# Patient Record
Sex: Male | Born: 1970 | Race: White | Hispanic: No | Marital: Single | Smoking: Never smoker
Health system: Southern US, Community
[De-identification: ages and names within clinical notes are randomized; demographics above are authoritative.]

## PROBLEM LIST (undated history)

## (undated) DIAGNOSIS — M109 Gout, unspecified: Secondary | ICD-10-CM

## (undated) DIAGNOSIS — Z87442 Personal history of urinary calculi: Secondary | ICD-10-CM

## (undated) DIAGNOSIS — N390 Urinary tract infection, site not specified: Secondary | ICD-10-CM

## (undated) DIAGNOSIS — T7840XA Allergy, unspecified, initial encounter: Secondary | ICD-10-CM

## (undated) DIAGNOSIS — E663 Overweight: Secondary | ICD-10-CM

## (undated) DIAGNOSIS — F840 Autistic disorder: Secondary | ICD-10-CM

## (undated) DIAGNOSIS — K5900 Constipation, unspecified: Secondary | ICD-10-CM

## (undated) DIAGNOSIS — M1990 Unspecified osteoarthritis, unspecified site: Secondary | ICD-10-CM

## (undated) DIAGNOSIS — N419 Inflammatory disease of prostate, unspecified: Secondary | ICD-10-CM

## (undated) DIAGNOSIS — D229 Melanocytic nevi, unspecified: Secondary | ICD-10-CM

## (undated) DIAGNOSIS — T7411XA Adult physical abuse, confirmed, initial encounter: Secondary | ICD-10-CM

## (undated) DIAGNOSIS — E7800 Pure hypercholesterolemia, unspecified: Secondary | ICD-10-CM

## (undated) DIAGNOSIS — K219 Gastro-esophageal reflux disease without esophagitis: Secondary | ICD-10-CM

## (undated) HISTORY — DX: Pure hypercholesterolemia, unspecified: E78.00

## (undated) HISTORY — DX: Autistic disorder: F84.0

## (undated) HISTORY — DX: Overweight: E66.3

## (undated) HISTORY — DX: Gout, unspecified: M10.9

## (undated) HISTORY — DX: Inflammatory disease of prostate, unspecified: N41.9

## (undated) HISTORY — DX: Adult physical abuse, confirmed, initial encounter: T74.11XA

## (undated) HISTORY — DX: Gastro-esophageal reflux disease without esophagitis: K21.9

## (undated) HISTORY — DX: Unspecified osteoarthritis, unspecified site: M19.90

## (undated) HISTORY — DX: Constipation, unspecified: K59.00

## (undated) HISTORY — DX: Personal history of urinary calculi: Z87.442

## (undated) HISTORY — DX: Melanocytic nevi, unspecified: D22.9

## (undated) HISTORY — DX: Allergy, unspecified, initial encounter: T78.40XA

---

## 2012-02-15 ENCOUNTER — Emergency Department: Payer: Self-pay | Admitting: Emergency Medicine

## 2012-02-15 LAB — HEMOGLOBIN: HGB: 14.1 g/dL (ref 13.0–18.0)

## 2012-02-15 LAB — COMPREHENSIVE METABOLIC PANEL
Albumin: 4.1 g/dL (ref 3.4–5.0)
Alkaline Phosphatase: 87 U/L (ref 50–136)
Anion Gap: 10 (ref 7–16)
Bilirubin,Total: 0.4 mg/dL (ref 0.2–1.0)
Co2: 25 mmol/L (ref 21–32)
Creatinine: 0.93 mg/dL (ref 0.60–1.30)
EGFR (African American): >60
Osmolality: 278 (ref 275–301)
Potassium: 4.2 mmol/L (ref 3.5–5.1)
SGPT (ALT): 24 U/L
Sodium: 140 mmol/L (ref 136–145)

## 2012-02-15 LAB — CBC
HCT: 41.8 % (ref 40.0–52.0)
HGB: 14.1 g/dL (ref 13.0–18.0)
WBC: 11.3 10*3/uL — ABNORMAL HIGH (ref 3.8–10.6)

## 2012-02-15 LAB — URINALYSIS, COMPLETE
Bilirubin,UR: NEGATIVE
Ketone: NEGATIVE
Ph: 6 (ref 4.5–8.0)
RBC,UR: 2 /HPF (ref 0–5)
Squamous Epithelial: NONE SEEN
WBC UR: <1 /HPF (ref 0–5)

## 2012-02-15 LAB — LIPASE, BLOOD: Lipase: 168 U/L (ref 73–393)

## 2012-03-04 ENCOUNTER — Ambulatory Visit: Payer: Self-pay | Admitting: Unknown Physician Specialty

## 2012-12-31 HISTORY — PX: CHOLECYSTECTOMY: SHX55

## 2013-12-23 ENCOUNTER — Emergency Department: Payer: Self-pay | Admitting: Internal Medicine

## 2013-12-23 LAB — COMPREHENSIVE METABOLIC PANEL
Albumin: 3.6 g/dL (ref 3.4–5.0)
Alkaline Phosphatase: 67 U/L
BUN: 13 mg/dL (ref 7–18)
Bilirubin,Total: 0.5 mg/dL (ref 0.2–1.0)
Calcium, Total: 8.6 mg/dL (ref 8.5–10.1)
Chloride: 108 mmol/L — ABNORMAL HIGH (ref 98–107)
Co2: 30 mmol/L (ref 21–32)
Creatinine: 0.95 mg/dL (ref 0.60–1.30)
Glucose: 106 mg/dL — ABNORMAL HIGH (ref 65–99)
Osmolality: 282 (ref 275–301)
Potassium: 4.1 mmol/L (ref 3.5–5.1)
SGOT(AST): 10 U/L — ABNORMAL LOW (ref 15–37)
SGPT (ALT): 20 U/L (ref 12–78)
Total Protein: 7.2 g/dL (ref 6.4–8.2)

## 2013-12-23 LAB — CK TOTAL AND CKMB (NOT AT ARMC): CK, Total: 36 U/L (ref 35–232)

## 2013-12-23 LAB — CBC
MCHC: 33.5 g/dL (ref 32.0–36.0)
MCV: 88 fL (ref 80–100)
RDW: 12.9 % (ref 11.5–14.5)

## 2013-12-23 LAB — TROPONIN I: Troponin-I: <0.02 ng/mL

## 2013-12-23 LAB — LIPASE, BLOOD: Lipase: 159 U/L (ref 73–393)

## 2013-12-26 ENCOUNTER — Emergency Department: Payer: Self-pay | Admitting: Emergency Medicine

## 2013-12-26 LAB — RAPID INFLUENZA A&B ANTIGENS

## 2013-12-29 ENCOUNTER — Emergency Department: Payer: Self-pay | Admitting: Internal Medicine

## 2014-01-22 ENCOUNTER — Emergency Department: Payer: Self-pay | Admitting: Emergency Medicine

## 2014-04-06 DIAGNOSIS — F840 Autistic disorder: Secondary | ICD-10-CM | POA: Insufficient documentation

## 2014-06-14 ENCOUNTER — Emergency Department: Payer: Self-pay | Admitting: Emergency Medicine

## 2014-10-11 ENCOUNTER — Emergency Department: Payer: Self-pay | Admitting: Emergency Medicine

## 2014-10-11 LAB — BASIC METABOLIC PANEL
ANION GAP: 6 — AB (ref 7–16)
BUN: 14 mg/dL (ref 7–18)
CHLORIDE: 105 mmol/L (ref 98–107)
CO2: 30 mmol/L (ref 21–32)
CREATININE: 1.09 mg/dL (ref 0.60–1.30)
Calcium, Total: 9.1 mg/dL (ref 8.5–10.1)
EGFR (African American): >60
Glucose: 91 mg/dL (ref 65–99)
Osmolality: 281 (ref 275–301)
Potassium: 4.3 mmol/L (ref 3.5–5.1)
Sodium: 141 mmol/L (ref 136–145)

## 2014-10-11 LAB — CBC WITH DIFFERENTIAL/PLATELET
Basophil #: 0 10*3/uL (ref 0.0–0.1)
Basophil %: 0.7 %
Eosinophil #: 0.1 10*3/uL (ref 0.0–0.7)
Eosinophil %: 0.7 %
HCT: 45.3 % (ref 40.0–52.0)
HGB: 14.8 g/dL (ref 13.0–18.0)
Lymphocyte #: 1.3 10*3/uL (ref 1.0–3.6)
Lymphocyte %: 17.6 %
MCH: 29.1 pg (ref 26.0–34.0)
MCHC: 32.7 g/dL (ref 32.0–36.0)
MCV: 89 fL (ref 80–100)
Monocyte #: 0.5 x10 3/mm (ref 0.2–1.0)
Monocyte %: 6.6 %
NEUTROS ABS: 5.5 10*3/uL (ref 1.4–6.5)
Neutrophil %: 74.4 %
PLATELETS: 228 10*3/uL (ref 150–440)
RBC: 5.1 10*6/uL (ref 4.40–5.90)
RDW: 12.9 % (ref 11.5–14.5)
WBC: 7.4 10*3/uL (ref 3.8–10.6)

## 2014-11-07 ENCOUNTER — Emergency Department: Payer: Self-pay | Admitting: Emergency Medicine

## 2014-11-07 LAB — CBC WITH DIFFERENTIAL/PLATELET
Basophil #: 0 x10 3/mm 3
Basophil %: 0.5 %
Eosinophil #: 0 x10 3/mm 3
Eosinophil %: 0.5 %
HCT: 44.6 %
HGB: 14.9 g/dL
Lymphocyte %: 20 %
Lymphs Abs: 1.4 x10 3/mm 3
MCH: 29.5 pg
MCHC: 33.3 g/dL
MCV: 88 fL
Monocyte #: 0.5 "x10 3/mm "
Monocyte %: 6.9 %
Neutrophil #: 4.9 x10 3/mm 3
Neutrophil %: 72.1 %
Platelet: 191 x10 3/mm 3
RBC: 5.04 x10 6/mm 3
RDW: 12.8 %
WBC: 6.8 x10 3/mm 3

## 2014-11-07 LAB — COMPREHENSIVE METABOLIC PANEL WITH GFR
Albumin: 3.8 g/dL
Alkaline Phosphatase: 92 U/L
Anion Gap: 6 — ABNORMAL LOW
BUN: 10 mg/dL
Bilirubin,Total: 0.9 mg/dL
Calcium, Total: 8.5 mg/dL
Chloride: 107 mmol/L
Co2: 29 mmol/L
Creatinine: 1.1 mg/dL
EGFR (African American): >60
EGFR (Non-African Amer.): >60
Glucose: 94 mg/dL
Osmolality: 282
Potassium: 4.5 mmol/L
SGOT(AST): 22 U/L
SGPT (ALT): 21 U/L
Sodium: 142 mmol/L
Total Protein: 8.2 g/dL

## 2014-11-07 LAB — URINALYSIS, COMPLETE
Bilirubin,UR: NEGATIVE
Glucose,UR: NEGATIVE mg/dL
Ketone: NEGATIVE
Leukocyte Esterase: NEGATIVE
Nitrite: NEGATIVE
Ph: 5
Protein: NEGATIVE
RBC,UR: 3 /HPF
Specific Gravity: 1.011
Squamous Epithelial: NONE SEEN
WBC UR: <1 /HPF

## 2014-11-07 LAB — LIPASE, BLOOD: Lipase: 166 U/L

## 2014-11-07 LAB — TROPONIN I: Troponin-I: <0.02 ng/mL

## 2014-11-09 LAB — URINE CULTURE

## 2014-11-30 ENCOUNTER — Emergency Department: Payer: Self-pay | Admitting: Emergency Medicine

## 2014-11-30 LAB — COMPREHENSIVE METABOLIC PANEL
Albumin: 3.6 g/dL (ref 3.4–5.0)
Alkaline Phosphatase: 92 U/L
Anion Gap: 3 — ABNORMAL LOW (ref 7–16)
BILIRUBIN TOTAL: 0.4 mg/dL (ref 0.2–1.0)
BUN: 16 mg/dL (ref 7–18)
CALCIUM: 8.5 mg/dL (ref 8.5–10.1)
CO2: 32 mmol/L (ref 21–32)
CREATININE: 1.24 mg/dL (ref 0.60–1.30)
Chloride: 106 mmol/L (ref 98–107)
EGFR (African American): >60
Glucose: 97 mg/dL (ref 65–99)
Osmolality: 282 (ref 275–301)
POTASSIUM: 4.3 mmol/L (ref 3.5–5.1)
SGOT(AST): 21 U/L (ref 15–37)
SGPT (ALT): 23 U/L
Sodium: 141 mmol/L (ref 136–145)
Total Protein: 8 g/dL (ref 6.4–8.2)

## 2014-11-30 LAB — CBC
HCT: 41.7 % (ref 40.0–52.0)
HGB: 13.9 g/dL (ref 13.0–18.0)
MCH: 29.3 pg (ref 26.0–34.0)
MCHC: 33.3 g/dL (ref 32.0–36.0)
MCV: 88 fL (ref 80–100)
Platelet: 215 10*3/uL (ref 150–440)
RBC: 4.75 10*6/uL (ref 4.40–5.90)
RDW: 13.2 % (ref 11.5–14.5)
WBC: 4.8 10*3/uL (ref 3.8–10.6)

## 2014-11-30 LAB — URINALYSIS, COMPLETE
BACTERIA: NONE SEEN
Bilirubin,UR: NEGATIVE
Glucose,UR: NEGATIVE mg/dL (ref 0–75)
Ketone: NEGATIVE
Leukocyte Esterase: NEGATIVE
NITRITE: NEGATIVE
PROTEIN: NEGATIVE
Ph: 6 (ref 4.5–8.0)
SPECIFIC GRAVITY: 1.005 (ref 1.003–1.030)
Squamous Epithelial: <1
WBC UR: NONE SEEN /HPF (ref 0–5)

## 2014-11-30 LAB — LIPASE, BLOOD: Lipase: 262 U/L (ref 73–393)

## 2015-02-25 ENCOUNTER — Emergency Department: Payer: Self-pay | Admitting: Emergency Medicine

## 2015-04-20 DIAGNOSIS — E663 Overweight: Secondary | ICD-10-CM | POA: Insufficient documentation

## 2015-04-20 DIAGNOSIS — K219 Gastro-esophageal reflux disease without esophagitis: Secondary | ICD-10-CM | POA: Insufficient documentation

## 2015-04-20 DIAGNOSIS — N419 Inflammatory disease of prostate, unspecified: Secondary | ICD-10-CM | POA: Insufficient documentation

## 2015-05-01 ENCOUNTER — Encounter: Payer: Self-pay | Admitting: Emergency Medicine

## 2015-05-01 ENCOUNTER — Emergency Department
Admission: EM | Admit: 2015-05-01 | Discharge: 2015-05-01 | Disposition: A | Discharge status: Home / Self Care | Payer: Medicaid Other | Attending: Emergency Medicine | Admitting: Emergency Medicine

## 2015-05-01 DIAGNOSIS — R1031 Right lower quadrant pain: Secondary | ICD-10-CM | POA: Diagnosis present

## 2015-05-01 DIAGNOSIS — K529 Noninfective gastroenteritis and colitis, unspecified: Secondary | ICD-10-CM | POA: Diagnosis not present

## 2015-05-01 HISTORY — DX: Urinary tract infection, site not specified: N39.0

## 2015-05-01 LAB — CBC WITH DIFFERENTIAL/PLATELET
Basophils Absolute: 0 10*3/uL (ref 0–0.1)
Basophils Relative: 1 %
Eosinophils Absolute: 0.1 10*3/uL (ref 0–0.7)
HCT: 42.5 % (ref 40.0–52.0)
HEMOGLOBIN: 14.5 g/dL (ref 13.0–18.0)
LYMPHS ABS: 1.2 10*3/uL (ref 1.0–3.6)
Lymphocytes Relative: 20 %
MCH: 29.8 pg (ref 26.0–34.0)
MCHC: 34.1 g/dL (ref 32.0–36.0)
MCV: 87.5 fL (ref 80.0–100.0)
Monocytes Absolute: 0.5 10*3/uL (ref 0.2–1.0)
Monocytes Relative: 8 %
Neutro Abs: 4.1 10*3/uL (ref 1.4–6.5)
Neutrophils Relative %: 68 %
Platelets: 206 10*3/uL (ref 150–440)
RBC: 4.85 MIL/uL (ref 4.40–5.90)
RDW: 12.9 % (ref 11.5–14.5)
WBC: 6 10*3/uL (ref 3.8–10.6)

## 2015-05-01 LAB — COMPREHENSIVE METABOLIC PANEL
ALK PHOS: 73 U/L (ref 38–126)
ALT: 13 U/L — AB (ref 17–63)
AST: 18 U/L (ref 15–41)
Albumin: 4.3 g/dL (ref 3.5–5.0)
Anion gap: 10 (ref 5–15)
BUN: 16 mg/dL (ref 6–20)
CO2: 27 mmol/L (ref 22–32)
Calcium: 10 mg/dL (ref 8.9–10.3)
Chloride: 101 mmol/L (ref 101–111)
Creatinine, Ser: 1.02 mg/dL (ref 0.61–1.24)
GFR calc Af Amer: >60 mL/min (ref >60)
GFR calc non Af Amer: >60 mL/min (ref >60)
GLUCOSE: 98 mg/dL (ref 65–99)
POTASSIUM: 4.1 mmol/L (ref 3.5–5.1)
Sodium: 138 mmol/L (ref 135–145)
Total Bilirubin: 1.4 mg/dL — ABNORMAL HIGH (ref 0.3–1.2)
Total Protein: 8.2 g/dL — ABNORMAL HIGH (ref 6.5–8.1)

## 2015-05-01 LAB — URINALYSIS COMPLETE WITH MICROSCOPIC (ARMC ONLY)
Bilirubin Urine: NEGATIVE
Glucose, UA: NEGATIVE mg/dL
LEUKOCYTES UA: NEGATIVE
Nitrite: NEGATIVE
Protein, ur: NEGATIVE mg/dL
SPECIFIC GRAVITY, URINE: 1.014 (ref 1.005–1.030)
SQUAMOUS EPITHELIAL / LPF: NONE SEEN
pH: 5 (ref 5.0–8.0)

## 2015-05-01 LAB — LIPASE, BLOOD: LIPASE: 39 U/L (ref 22–51)

## 2015-05-01 MED ORDER — ONDANSETRON HCL 4 MG/2ML IJ SOLN
INTRAMUSCULAR | Status: AC
  Filled 2015-05-01: qty 2

## 2015-05-01 MED ORDER — ONDANSETRON HCL 4 MG/2ML IJ SOLN
4.0000 mg | Freq: Once | INTRAMUSCULAR | Status: AC
  Administered 2015-05-01: 4 mg via INTRAVENOUS

## 2015-05-01 MED ORDER — ONDANSETRON HCL 4 MG PO TABS: 4.0000 mg | ORAL_TABLET | Freq: Every day | ORAL | Status: DC | PRN

## 2015-05-01 MED ORDER — MORPHINE SULFATE 2 MG/ML IJ SOLN: 2.0000 mg | Freq: Once | INTRAMUSCULAR | Status: AC

## 2015-05-01 MED ORDER — CIPROFLOXACIN HCL 500 MG PO TABS: 500.0000 mg | ORAL_TABLET | Freq: Two times a day (BID) | ORAL | Status: AC

## 2015-05-01 MED ORDER — HYDROCODONE-ACETAMINOPHEN 5-325 MG PO TABS: 1.0000 | ORAL_TABLET | ORAL | Status: DC | PRN

## 2015-05-01 MED ORDER — MORPHINE SULFATE 10 MG/ML IJ SOLN
INTRAMUSCULAR | Status: AC
  Administered 2015-05-01: 2 mg
  Filled 2015-05-01: qty 1

## 2015-05-01 NOTE — Discharge Instructions (Signed)

## 2015-05-01 NOTE — ED Provider Notes (Signed)
[facility] Emergency Department Provider Note    ____________________________________________  Time seen: 7:30  I have reviewed the triage vital signs and the nursing notes.   HISTORY  Chief Complaint Abdominal Pain   Patient with history of autism    HPI Jesus Guerrero is a 44 y.o. male who presents with 2 weeks of right lower quadrant abdominal pain. He notes it has been constant. States it is 9 out of 10. He notes the quality is aching. He reports that when he eats he has pain in the right lower quadrant and diarrhea. Nothing seems to make it better. Does note a history of UTIs but this is different     Past Medical History  Diagnosis Date  . Autism   . Prostatitis   . Suspicious nevus   . GERD (gastroesophageal reflux disease)   . Overweight   . UTI (lower urinary tract infection)     Patient Active Problem List   Diagnosis Date Noted  . Autism   . Prostatitis   . GERD (gastroesophageal reflux disease)   . Overweight     Past Surgical History  Procedure Laterality Date  . Cholecystectomy  2014    No current outpatient prescriptions on file.  Allergies Codeine  Family History  Problem Relation Age of Onset  . Family history unknown: Yes    Social History History  Substance Use Topics  . Smoking status: Never Smoker   . Smokeless tobacco: Not on file  . Alcohol Use: No    Review of Systems  Constitutional: Negative for fever. Eyes: Negative for visual changes. ENT: Negative for sore throat. Cardiovascular: Negative for chest pain. Respiratory: Negative for shortness of breath. Gastrointestinal: Positive for abdominal pain and diarrhea. No vomiting Genitourinary: Negative for dysuria. Musculoskeletal: Negative for back pain. Skin: Negative for rash. Neurological: Negative for headaches, focal weakness or numbness.   10-point ROS otherwise  negative.  ____________________________________________   PHYSICAL EXAM:  VITAL SIGNS: ED Triage Vitals  Enc Vitals Group     BP 05/01/15 0731 129/66 mmHg     Pulse Rate 05/01/15 0731 96     Resp 05/01/15 0731 17     Temp 05/01/15 0731 98.5 F (36.9 C)     Temp Source 05/01/15 0731 Oral     SpO2 05/01/15 0731 95 %     Weight 05/01/15 0731 180 lb (81.647 kg)     Height 05/01/15 0731 5\' 4"  (1.626 m)     Head Cir --      Peak Flow --      Pain Score 05/01/15 0732 8     Pain Loc --      Pain Edu? --      Excl. in [place]? --      Constitutional: Alert and oriented. Well appearing and in no distress. Eyes: Conjunctivae are normal. PERRL. Normal extraocular movements. ENT   Head: Normocephalic and atraumatic.   Nose: No congestion/rhinnorhea.   Mouth/Throat: Mucous membranes are moist.   Neck: No stridor. Hematological/Lymphatic/Immunilogical: No cervical lymphadenopathy. Cardiovascular: Normal rate, regular rhythm. Normal and symmetric distal pulses are present in all extremities. No murmurs, rubs, or gallops. Respiratory: Normal respiratory effort without tachypnea nor retractions. Breath sounds are clear and equal bilaterally. No wheezes/rales/rhonchi. Gastrointestinal: Patient with mild right lower quadrant tenderness. No distention. There is no CVA tenderness. Genitourinary: No rash Musculoskeletal: Nontender with normal range of motion in all extremities. No joint effusions.  Neurologic:  Normal speech and language. No gross  focal neurologic deficits are appreciated. Speech is normal. No gait instability. Skin:  Skin is warm, dry and intact. No rash noted. Psychiatric: Mood and affect are normal. Speech and behavior are normal. Patient exhibits appropriate insight and judgment.  ____________________________________________   EKG  None  ____________________________________________     RADIOLOGY  none  ____________________________________________   PROCEDURES  Procedure(s) performed: None  Critical Care performed: No  ____________________________________________   INITIAL IMPRESSION / ASSESSMENT AND PLAN / ED COURSE  Pertinent labs & imaging results that were available during my care of the patient were reviewed by me and considered in my medical decision making (see chart for details).  Patient with 2 weeks of chronic diarrhea and abdominal cramping. Has seen his PCP for this and she suggested GI follow-up. Given pain medication IV in ED patient reports improvement in pain. On reexam no right lower quadrant tenderness to palpation. Will discharge patient with antibiotics for suspected colitis and GI follow-up. Return precautions given.  ____________________________________________   FINAL CLINICAL IMPRESSION(S) / ED DIAGNOSES  Final diagnoses:  None     Lavonia Drafts, MD 05/01/15 1059

## 2015-05-01 NOTE — ED Notes (Signed)
Pt states he has been having lower rt sided abd pain x 2 weeks. Pt has been seen by pcp and had lab work drawn and all testing was negative. Pt states pcp stated that if he continued to have pain that he would need to see a GI specialist. Pt states he is now not able to eat because this causes greater pain.

## 2015-05-01 NOTE — ED Notes (Signed)
Patient c/o sharp RLQ abdominal pain. States that pain started 2 weeks ago and has worsened with time. Reports diarrhea. Denies n/v. Denies fever or chills. States he has a hx of a similar pain; when asked what he was diagnosed with during previous episodes patient states "different things".

## 2015-05-01 NOTE — ED Notes (Signed)
Pt continues to rest in pain in nad at this time. Pt denies needs at this time.

## 2015-05-04 ENCOUNTER — Emergency Department: Payer: Medicaid Other

## 2015-05-04 ENCOUNTER — Emergency Department
Admission: EM | Admit: 2015-05-04 | Discharge: 2015-05-04 | Disposition: A | Discharge status: Home / Self Care | Payer: Medicaid Other | Attending: Emergency Medicine | Admitting: Emergency Medicine

## 2015-05-04 ENCOUNTER — Encounter: Payer: Self-pay | Admitting: Emergency Medicine

## 2015-05-04 DIAGNOSIS — R1031 Right lower quadrant pain: Secondary | ICD-10-CM | POA: Insufficient documentation

## 2015-05-04 DIAGNOSIS — Z792 Long term (current) use of antibiotics: Secondary | ICD-10-CM | POA: Insufficient documentation

## 2015-05-04 DIAGNOSIS — G8929 Other chronic pain: Secondary | ICD-10-CM | POA: Diagnosis not present

## 2015-05-04 DIAGNOSIS — R109 Unspecified abdominal pain: Secondary | ICD-10-CM

## 2015-05-04 LAB — URINALYSIS COMPLETE WITH MICROSCOPIC (ARMC ONLY)
BACTERIA UA: NONE SEEN
BILIRUBIN URINE: NEGATIVE
GLUCOSE, UA: NEGATIVE mg/dL
Ketones, ur: NEGATIVE mg/dL
LEUKOCYTES UA: NEGATIVE
Nitrite: NEGATIVE
PH: 6 (ref 5.0–8.0)
Protein, ur: NEGATIVE mg/dL
SPECIFIC GRAVITY, URINE: 1.012 (ref 1.005–1.030)

## 2015-05-04 LAB — CBC WITH DIFFERENTIAL/PLATELET
Basophils Absolute: 0 10*3/uL (ref 0–0.1)
Basophils Relative: 1 %
EOS ABS: 0.1 10*3/uL (ref 0–0.7)
Eosinophils Relative: 1 %
HCT: 41.6 % (ref 40.0–52.0)
Hemoglobin: 14.2 g/dL (ref 13.0–18.0)
Lymphocytes Relative: 16 %
Lymphs Abs: 1.1 10*3/uL (ref 1.0–3.6)
MCH: 30 pg (ref 26.0–34.0)
MCHC: 34.1 g/dL (ref 32.0–36.0)
MCV: 87.9 fL (ref 80.0–100.0)
Monocytes Absolute: 0.5 10*3/uL (ref 0.2–1.0)
Monocytes Relative: 7 %
Neutro Abs: 4.9 10*3/uL (ref 1.4–6.5)
PLATELETS: 202 10*3/uL (ref 150–440)
RBC: 4.73 MIL/uL (ref 4.40–5.90)
RDW: 13.2 % (ref 11.5–14.5)
WBC: 6.5 10*3/uL (ref 3.8–10.6)

## 2015-05-04 LAB — COMPREHENSIVE METABOLIC PANEL
ALT: 14 U/L — AB (ref 17–63)
AST: 22 U/L (ref 15–41)
Albumin: 3.9 g/dL (ref 3.5–5.0)
Alkaline Phosphatase: 65 U/L (ref 38–126)
Anion gap: 8 (ref 5–15)
BILIRUBIN TOTAL: 0.5 mg/dL (ref 0.3–1.2)
BUN: 13 mg/dL (ref 6–20)
CHLORIDE: 102 mmol/L (ref 101–111)
CO2: 27 mmol/L (ref 22–32)
Calcium: 9.2 mg/dL (ref 8.9–10.3)
Creatinine, Ser: 1.07 mg/dL (ref 0.61–1.24)
GFR calc Af Amer: >60 mL/min (ref >60)
Glucose, Bld: 112 mg/dL — ABNORMAL HIGH (ref 65–99)
POTASSIUM: 4.1 mmol/L (ref 3.5–5.1)
SODIUM: 137 mmol/L (ref 135–145)
Total Protein: 8 g/dL (ref 6.5–8.1)

## 2015-05-04 MED ORDER — KETOROLAC TROMETHAMINE 30 MG/ML IJ SOLN
30.0000 mg | Freq: Once | INTRAMUSCULAR | Status: AC
  Administered 2015-05-04: 30 mg via INTRAVENOUS

## 2015-05-04 MED ORDER — KETOROLAC TROMETHAMINE 30 MG/ML IJ SOLN
INTRAMUSCULAR | Status: AC
  Administered 2015-05-04: 30 mg via INTRAVENOUS
  Filled 2015-05-04: qty 1

## 2015-05-04 NOTE — ED Notes (Signed)
RLQ pain for several days and nausea.  Friend that dropped pt off states pt is in a detox program and is not to receive any narcotics.  Skin w/d with good color.

## 2015-05-04 NOTE — Discharge Instructions (Signed)
No certain cause was found for your abdominal pain which has been going on for several weeks to months. Your Lucianne Lei and evaluation are reassuring. I recommend following up with a gastroenterologist and referral was provided free to call their office. Return to the emergency department for any new or worsening abdominal pain fever black or bloody stools vomiting chest pain trouble breathing or any other symptoms concerning to you.  Abdominal Pain Many things can cause abdominal pain. Usually, abdominal pain is not caused by a disease and will improve without treatment. It can often be observed and treated at home. Your health care provider will do a physical exam and possibly order blood tests and X-rays to help determine the seriousness of your pain. However, in many cases, more time must pass before a clear cause of the pain can be found. Before that point, your health care provider may not know if you need more testing or further treatment. HOME CARE INSTRUCTIONS  Monitor your abdominal pain for any changes. The following actions may help to alleviate any discomfort you are experiencing:  Only take over-the-counter or prescription medicines as directed by your health care provider.  Do not take laxatives unless directed to do so by your health care provider.  Try a clear liquid diet (broth, tea, or water) as directed by your health care provider. Slowly move to a bland diet as tolerated. SEEK MEDICAL CARE IF:  You have unexplained abdominal pain.  You have abdominal pain associated with nausea or diarrhea.  You have pain when you urinate or have a bowel movement.  You experience abdominal pain that wakes you in the night.  You have abdominal pain that is worsened or improved by eating food.  You have abdominal pain that is worsened with eating fatty foods.  You have a fever. SEEK IMMEDIATE MEDICAL CARE IF:   Your pain does not go away within 2 hours.  You keep throwing up  (vomiting).  Your pain is felt only in portions of the abdomen, such as the right side or the left lower portion of the abdomen.  You pass bloody or black tarry stools. MAKE SURE YOU:  Understand these instructions.   Will watch your condition.   Will get help right away if you are not doing well or get worse.  Document Released: 09/26/2005 Document Revised: 12/22/2013 Document Reviewed: 08/26/2013 [facility] Patient Information 2015 [place], [place]. This information is not intended to replace advice given to you by your health care provider. Make sure you discuss any questions you have with your health care provider.

## 2015-05-04 NOTE — ED Notes (Signed)
Pt to xray at this time.

## 2015-05-04 NOTE — ED Notes (Signed)
Pt returns from xray. Nad.

## 2015-05-04 NOTE — ED Provider Notes (Signed)
[facility] Emergency Department Provider Note    ____________________________________________  Time seen: 7:30 AM  I have reviewed the triage vital signs and the nursing notes.   HISTORY  Chief Complaint Abdominal Pain       HPI Jesus Guerrero is a 44 y.o. male who is coming to the emergency department with several weeks of intermittent right lower quadrant abdominal pain. He was actually seen in the emergency department a few days ago with similar pain. He has seen his primary care doctor and was referred to see a gastroenterologist and he has not done this yet. Patient states the pain is in the right lower quadrant and usually comes to a peak around having a bowel movement which is often diarrhea and then persistent little bit after the bowel movement. The pain is worse as 9 out of 10. The pain currently is about 8 out of 10. He has seen no blood in his stools he has had no nausea.     Past Medical History  Diagnosis Date  . Autism   . Prostatitis   . Suspicious nevus   . GERD (gastroesophageal reflux disease)   . Overweight   . UTI (lower urinary tract infection)     Patient Active Problem List   Diagnosis Date Noted  . Autism   . Prostatitis   . GERD (gastroesophageal reflux disease)   . Overweight     Past Surgical History  Procedure Laterality Date  . Cholecystectomy  2014    Current Outpatient Rx  Name  Route  Sig  Dispense  Refill  . ciprofloxacin (CIPRO) 500 MG tablet   Oral   Take 1 tablet (500 mg total) by mouth 2 (two) times daily.   20 tablet   0   . ondansetron (ZOFRAN) 4 MG tablet   Oral   Take 1 tablet (4 mg total) by mouth daily as needed for nausea or vomiting.   15 tablet   1   . HYDROcodone-acetaminophen (NORCO/VICODIN) 5-325 MG per tablet   Oral   Take 1 tablet by mouth every 4 (four) hours as needed for moderate pain. Patient not taking: Reported on 05/04/2015   10 tablet   0      Allergies Codeine  Family History  Problem Relation Age of Onset  . Family history unknown: Yes    Social History History  Substance Use Topics  . Smoking status: Never Smoker   . Smokeless tobacco: Not on file  . Alcohol Use: No    Review of Systems  Constitutional: Negative for fever. Eyes: Negative for visual changes. ENT: Negative for sore throat. Cardiovascular: Negative for chest pain. Respiratory: Negative for shortness of breath. Gastrointestinal: Negative for vomiting . No bloody stools positive for diarrhea Genitourinary: Negative for dysuria. Musculoskeletal: Negative for back pain. Skin: Negative for rash. Neurological: Negative for headaches, focal weakness or numbness.   10-point ROS otherwise negative.  ____________________________________________   PHYSICAL EXAM:  VITAL SIGNS: ED Triage Vitals  Enc Vitals Group     BP 05/04/15 0716 128/76 mmHg     Pulse Rate 05/04/15 0716 79     Resp 05/04/15 0716 18     Temp 05/04/15 0716 98.1 F (36.7 C)     Temp Source 05/04/15 0716 Oral     SpO2 05/04/15 0716 99 %     Weight --      Height --      Head Cir --      Peak  Flow --      Pain Score 05/04/15 0718 6     Pain Loc --      Pain Edu? --      Excl. in [place]? --      Constitutional: Alert and oriented. Well appearing and in no distress. Eyes: Conjunctivae are normal. PERRL. Normal extraocular movements. Slight disconjugate movement of the eyes ENT   Head: Normocephalic and atraumatic.   Nose: No congestion/rhinnorhea.   Mouth/Throat: Mucous membranes are moist.   Neck: No stridor. Hematological/Lymphatic/Immunilogical: No cervical lymphadenopathy. Cardiovascular: Normal rate, regular rhythm. Normal and symmetric distal pulses are present in all extremities. No murmurs, rubs, or gallops. Respiratory: Normal respiratory effort without tachypnea nor retractions. Breath sounds are clear and equal bilaterally. No  wheezes/rales/rhonchi. Gastrointestinal: Soft and nontender. No specific right lower quadrant pain on palpation. No distention. No abdominal bruits. There is no CVA tenderness. Genitourinary: Deferred Musculoskeletal: Nontender with normal range of motion in all extremities. No joint effusions.  No lower extremity tenderness nor edema. Both hands are erythematous with smooth skin. Neurologic:  Normal speech and language. No gross focal neurologic deficits are appreciated. Speech is normal. No gait instability. Skin:  Skin is warm, dry and intact. No rash noted. Psychiatric: Mood and affect are normal. Speech and behavior are normal. Patient exhibits appropriate insight and judgment.  ____________________________________________    LABS (pertinent positives/negatives)  No elevated white blood cell count no significant maladies on his metabolic panel.  ____________________________________________   EKG    ____________________________________________    RADIOLOGY   Reviewed results of chest and abdomen abdomen x-ray per radiologist's large stool burning right: No other significant abnormalities ____________________________________________   PROCEDURES  Procedure(s) performed: None  Critical Care performed: No  ____________________________________________   INITIAL IMPRESSION / ASSESSMENT AND PLAN / ED COURSE  Pertinent labs & imaging results that were available during my care of the patient were reviewed by me and considered in my medical decision making (see chart for details).  The patient has return to the emergency department for similar abdominal pain that he has had previously. His abdominal exam is reassuring with no focal right lower quadrant tenderness despite him stating that he has 8 out of 10 pain there. I do agree with his PCP that he should be following up with GI with a history of diarrhea and pain with bowel movements for possible colonoscopy I discussed  this with him. We will go ahead and do screening x-ray and laboratory work here and treated him with Toradol. He has asked to avoid narcotics.  The patient's laboratory evaluation and x-ray are reassuring. I also was able to review a CT scan performed in February of this year for right lower quadrant pain similar to the pains he is continued to have since that time and it was negative for acute findings. I do not think that he needs additional imaging or CT today with a reassuring exam and lab evaluation I will again refer him to see gastroenterology. Return precautions and discharge plan discussed with the patient  ____________________________________________   FINAL CLINICAL IMPRESSION(S) / ED DIAGNOSES  Final diagnoses:  None   chronic right-sided abdominal pain   Lisa Roca, MD 05/06/15 [phone]

## 2015-05-07 ENCOUNTER — Encounter: Payer: Self-pay | Admitting: Emergency Medicine

## 2015-05-07 ENCOUNTER — Emergency Department: Payer: Medicaid Other

## 2015-05-07 ENCOUNTER — Emergency Department
Admission: EM | Admit: 2015-05-07 | Discharge: 2015-05-07 | Disposition: A | Discharge status: Home / Self Care | Payer: Medicaid Other | Attending: Emergency Medicine | Admitting: Emergency Medicine

## 2015-05-07 DIAGNOSIS — Z792 Long term (current) use of antibiotics: Secondary | ICD-10-CM | POA: Diagnosis not present

## 2015-05-07 DIAGNOSIS — K5900 Constipation, unspecified: Secondary | ICD-10-CM | POA: Diagnosis not present

## 2015-05-07 MED ORDER — SENNOSIDES-DOCUSATE SODIUM 8.6-50 MG PO TABS: 2.0000 | ORAL_TABLET | Freq: Every day | ORAL | Status: DC | PRN

## 2015-05-07 MED ORDER — DOCUSATE SODIUM 100 MG PO CAPS
100.0000 mg | ORAL_CAPSULE | Freq: Once | ORAL | Status: AC
  Administered 2015-05-07: 100 mg via ORAL

## 2015-05-07 MED ORDER — DOCUSATE SODIUM 100 MG PO CAPS
ORAL_CAPSULE | ORAL | Status: AC
  Administered 2015-05-07: 100 mg via ORAL
  Filled 2015-05-07: qty 1

## 2015-05-07 NOTE — ED Notes (Signed)
Pt states he has been constipated since Wednesday. Pt states he has had a bowel movement since Wednesday but that the constipation is not resolved. Pt states he has a stomach ache 9/10 with lower back pain. Pt presents in NAD, alert and oriented.

## 2015-05-07 NOTE — ED Notes (Signed)
Patient presents to the ED with constipation and cramping since Sunday.  Patient reports a very small bowel movement at 5:30pm yesterday.  Patient denies nausea and vomiting.  Patient appears comfortable, sitting relaxed in triage.  Patient was seen in ED on Wednesday and diagnosed with constipation.  Patient states he was not given any medication, prescriptions or instructions when leaving the ED on Wednesday.

## 2015-05-07 NOTE — ED Provider Notes (Signed)
[facility] Emergency Department Provider Note    ____________________________________________  Time seen: 1020 hrs.  I have reviewed the triage vital signs and the nursing notes.   HISTORY  Chief Complaint Constipation   Autism and limits the patient's conversation.    HPI Jesus Guerrero is a 44 y.o. male complaining of 3 days of constipation. Patient stated he has been having hard stools for the last 3 days. Patient states his last bowel movement was yesterday for a hop produces only a small amount of stool feel bloated. Patient denies any nausea or vomiting. Patient is rating his pain as a 9/10. Patient states he feel bloated and the pain is a constant dull fullness. There is a history of narcotic pain medications.     Past Medical History  Diagnosis Date  . Autism   . Prostatitis   . Suspicious nevus   . GERD (gastroesophageal reflux disease)   . Overweight   . UTI (lower urinary tract infection)     Patient Active Problem List   Diagnosis Date Noted  . Autism   . Prostatitis   . GERD (gastroesophageal reflux disease)   . Overweight     Past Surgical History  Procedure Laterality Date  . Cholecystectomy  2014    Current Outpatient Rx  Name  Route  Sig  Dispense  Refill  . ciprofloxacin (CIPRO) 500 MG tablet   Oral   Take 1 tablet (500 mg total) by mouth 2 (two) times daily.   20 tablet   0   . HYDROcodone-acetaminophen (NORCO/VICODIN) 5-325 MG per tablet   Oral   Take 1 tablet by mouth every 4 (four) hours as needed for moderate pain. Patient not taking: Reported on 05/04/2015   10 tablet   0   . ondansetron (ZOFRAN) 4 MG tablet   Oral   Take 1 tablet (4 mg total) by mouth daily as needed for nausea or vomiting.   15 tablet   1     Allergies Codeine  Family History  Problem Relation Age of Onset  . Congestive Heart Failure Father     Social History History  Substance Use Topics  . Smoking status: Never  Smoker   . Smokeless tobacco: Not on file  . Alcohol Use: No    Review of Systems  Constitutional: Negative for fever. Eyes: Negative for visual changes. ENT: Negative for sore throat. Cardiovascular: Negative for chest pain. Respiratory: Negative for shortness of breath. Gastrointestinal: Positive for abdominal pain, but denies vomiting or diarrhea.  Genitourinary: Negative for dysuria. Musculoskeletal: Positive for back pain. Skin: Negative for rash. Neurological: Negative for headaches, focal weakness or numbness. Psychiatric:Negative Endocrine:Negative Hematological/Lymphatic:Negative Allergic/Immunilogical: Negative  10-point ROS otherwise negative.  ____________________________________________   PHYSICAL EXAM:  VITAL SIGNS: ED Triage Vitals  Enc Vitals Group     BP 05/07/15 0836 130/60 mmHg     Pulse Rate 05/07/15 0836 81     Resp 05/07/15 0836 18     Temp 05/07/15 0836 97.7 F (36.5 C)     Temp Source 05/07/15 0836 Oral     SpO2 05/07/15 0836 97 %     Weight 05/07/15 0836 180 lb (81.647 kg)     Height 05/07/15 0836 5\' 4"  (1.626 m)     Head Cir --      Peak Flow --      Pain Score 05/07/15 0837 9     Pain Loc --      Pain Edu? --  Excl. in [place]? --      Constitutional: Alert and oriented. Mild distress Eyes: Conjunctivae are normal. PERRL. Normal extraocular movements. ENT   Head: Normocephalic and atraumatic.   Nose: No congestion/rhinnorhea.   Mouth/Throat: Mucous membranes are moist.   Neck: No stridor. Hematological/Lymphatic/Immunilogical: No cervical lymphadenopathy. Cardiovascular: Normal rate, regular rhythm. Normal and symmetric distal pulses are present in all extremities. No murmurs, rubs, or gallops. Respiratory: Normal respiratory effort without tachypnea nor retractions. Breath sounds are clear and equal bilaterally. No wheezes/rales/rhonchi. Gastrointestinal: Soft and nontender. Mild distention. No abdominal bruits.  There are decreased bowel sounds throughout. There is no CVA tenderness. Genitourinary: Not examined Musculoskeletal: Nontender with normal range of motion in all extremities. No joint effusions.  No lower extremity tenderness nor edema. Neurologic:  Normal speech and language. No gross focal neurologic deficits are appreciated. Speech is normal. No gait instability. Skin:  Skin is warm, dry and intact. No rash noted. Psychiatric: Mood and affect are normal. Speech and behavior are normal. Patient exhibits appropriate insight and judgment.  ____________________________________________    LABS (pertinent positives/negatives)  None  ____________________________________________   EKG  None  ____________________________________________    RADIOLOGY  No obstruction  ____________________________________________   PROCEDURES  Procedure(s) performed: None  Critical Care performed: No  ____________________________________________   INITIAL IMPRESSION / ASSESSMENT AND PLAN / ED COURSE  Pertinent labs & imaging results that were available during my care of the patient were reviewed by me and considered in my medical decision making (see chart for details).  constipation  ____________________________________________   FINAL CLINICAL IMPRESSION(S) / ED DIAGNOSES  Final diagnoses:  Constipation, unspecified constipation type     Sable Feil, PA-C 05/07/15 1113  Lavonia Drafts, MD 05/07/15 [phone]

## 2015-05-25 ENCOUNTER — Emergency Department
Admission: EM | Admit: 2015-05-25 | Discharge: 2015-05-25 | Disposition: A | Discharge status: Home / Self Care | Payer: Medicaid Other | Attending: Emergency Medicine | Admitting: Emergency Medicine

## 2015-05-25 ENCOUNTER — Encounter: Payer: Self-pay | Admitting: General Practice

## 2015-05-25 DIAGNOSIS — M549 Dorsalgia, unspecified: Secondary | ICD-10-CM | POA: Insufficient documentation

## 2015-05-25 DIAGNOSIS — R112 Nausea with vomiting, unspecified: Secondary | ICD-10-CM

## 2015-05-25 DIAGNOSIS — K5900 Constipation, unspecified: Secondary | ICD-10-CM | POA: Insufficient documentation

## 2015-05-25 DIAGNOSIS — R6883 Chills (without fever): Secondary | ICD-10-CM

## 2015-05-25 DIAGNOSIS — Z79899 Other long term (current) drug therapy: Secondary | ICD-10-CM | POA: Diagnosis not present

## 2015-05-25 DIAGNOSIS — R531 Weakness: Secondary | ICD-10-CM | POA: Diagnosis not present

## 2015-05-25 LAB — COMPREHENSIVE METABOLIC PANEL
ALT: 13 U/L — AB (ref 17–63)
ANION GAP: 7 (ref 5–15)
AST: 18 U/L (ref 15–41)
Albumin: 4.2 g/dL (ref 3.5–5.0)
Alkaline Phosphatase: 78 U/L (ref 38–126)
BILIRUBIN TOTAL: 0.8 mg/dL (ref 0.3–1.2)
BUN: 15 mg/dL (ref 6–20)
CHLORIDE: 105 mmol/L (ref 101–111)
CO2: 27 mmol/L (ref 22–32)
Calcium: 9.1 mg/dL (ref 8.9–10.3)
Creatinine, Ser: 1.05 mg/dL (ref 0.61–1.24)
GFR calc Af Amer: >60 mL/min (ref >60)
GLUCOSE: 109 mg/dL — AB (ref 65–99)
POTASSIUM: 4.5 mmol/L (ref 3.5–5.1)
Sodium: 139 mmol/L (ref 135–145)
Total Protein: 8.2 g/dL — ABNORMAL HIGH (ref 6.5–8.1)

## 2015-05-25 LAB — CBC
HCT: 42.8 % (ref 40.0–52.0)
HEMOGLOBIN: 14.4 g/dL (ref 13.0–18.0)
MCH: 29.2 pg (ref 26.0–34.0)
MCHC: 33.6 g/dL (ref 32.0–36.0)
MCV: 86.9 fL (ref 80.0–100.0)
Platelets: 236 10*3/uL (ref 150–440)
RBC: 4.93 MIL/uL (ref 4.40–5.90)
RDW: 12.8 % (ref 11.5–14.5)
WBC: 6.2 10*3/uL (ref 3.8–10.6)

## 2015-05-25 LAB — URINALYSIS COMPLETE WITH MICROSCOPIC (ARMC ONLY)
BILIRUBIN URINE: NEGATIVE
Bacteria, UA: NONE SEEN
GLUCOSE, UA: NEGATIVE mg/dL
KETONES UR: NEGATIVE mg/dL
LEUKOCYTES UA: NEGATIVE
NITRITE: NEGATIVE
PROTEIN: NEGATIVE mg/dL
SPECIFIC GRAVITY, URINE: 1.006 (ref 1.005–1.030)
pH: 5 (ref 5.0–8.0)

## 2015-05-25 MED ORDER — POLYETHYLENE GLYCOL 3350 17 G PO PACK: 17.0000 g | PACK | Freq: Every day | ORAL | Status: DC

## 2015-05-25 MED ORDER — ONDANSETRON HCL 4 MG PO TABS: 4.0000 mg | ORAL_TABLET | Freq: Every day | ORAL | Status: DC | PRN

## 2015-05-25 MED ORDER — KETOROLAC TROMETHAMINE 30 MG/ML IJ SOLN
INTRAMUSCULAR | Status: AC
  Filled 2015-05-25: qty 1

## 2015-05-25 NOTE — ED Notes (Signed)
Pt. Arrived to ed from home with reports of experiencing lower back pain this am when having the urge to urinate. Pt states "my back hurts everytime i have to pee". Pt reports experiencing vomiting episodes this AM. PT alert and oriented. No acute distress noted at this time.

## 2015-05-25 NOTE — Discharge Instructions (Signed)
Nausea and Vomiting Nausea is a sick feeling that often comes before throwing up (vomiting). Vomiting is a reflex where stomach contents come out of your mouth. Vomiting can cause severe loss of body fluids (dehydration). Children and elderly adults can become dehydrated quickly, especially if they also have diarrhea. Nausea and vomiting are symptoms of a condition or disease. It is important to find the cause of your symptoms. CAUSES   Direct irritation of the stomach lining. This irritation can result from increased acid production (gastroesophageal reflux disease), infection, food poisoning, taking certain medicines (such as nonsteroidal anti-inflammatory drugs), alcohol use, or tobacco use.  Signals from the brain.These signals could be caused by a headache, heat exposure, an inner ear disturbance, increased pressure in the brain from injury, infection, a tumor, or a concussion, pain, emotional stimulus, or metabolic problems.  An obstruction in the gastrointestinal tract (bowel obstruction).  Illnesses such as diabetes, hepatitis, gallbladder problems, appendicitis, kidney problems, cancer, sepsis, atypical symptoms of a heart attack, or eating disorders.  Medical treatments such as chemotherapy and radiation.  Receiving medicine that makes you sleep (general anesthetic) during surgery. DIAGNOSIS Your caregiver may ask for tests to be done if the problems do not improve after a few days. Tests may also be done if symptoms are severe or if the reason for the nausea and vomiting is not clear. Tests may include:  Urine tests.  Blood tests.  Stool tests.  Cultures (to look for evidence of infection).  X-rays or other imaging studies. Test results can help your caregiver make decisions about treatment or the need for additional tests. TREATMENT You need to stay well hydrated. Drink frequently but in small amounts.You may wish to drink water, sports drinks, clear broth, or eat frozen  ice pops or gelatin dessert to help stay hydrated.When you eat, eating slowly may help prevent nausea.There are also some antinausea medicines that may help prevent nausea. HOME CARE INSTRUCTIONS   Take all medicine as directed by your caregiver.  If you do not have an appetite, do not force yourself to eat. However, you must continue to drink fluids.  If you have an appetite, eat a normal diet unless your caregiver tells you differently.  Eat a variety of complex carbohydrates (rice, wheat, potatoes, bread), lean meats, yogurt, fruits, and vegetables.  Avoid high-fat foods because they are more difficult to digest.  Drink enough water and fluids to keep your urine clear or pale yellow.  If you are dehydrated, ask your caregiver for specific rehydration instructions. Signs of dehydration may include:  Severe thirst.  Dry lips and mouth.  Dizziness.  Dark urine.  Decreasing urine frequency and amount.  Confusion.  Rapid breathing or pulse. SEEK IMMEDIATE MEDICAL CARE IF:   You have blood or brown flecks (like coffee grounds) in your vomit.  You have black or bloody stools.  You have a severe headache or stiff neck.  You are confused.  You have severe abdominal pain.  You have chest pain or trouble breathing.  You do not urinate at least once every 8 hours.  You develop cold or clammy skin.  You continue to vomit for longer than 24 to 48 hours.  You have a fever. MAKE SURE YOU:   Understand these instructions.  Will watch your condition.  Will get help right away if you are not doing well or get worse. Document Released: 12/17/2005 Document Revised: 03/10/2012 Document Reviewed: 05/16/2011 Exit[employer] Patient Information 2015 [employer]. This information is not intended   to replace advice given to you by your health care provider. Make sure you discuss any questions you have with your health care provider. Constipation Constipation is when a person has  fewer than three bowel movements a week, has difficulty having a bowel movement, or has stools that are dry, hard, or larger than normal. As people grow older, constipation is more common. If you try to fix constipation with medicines that make you have a bowel movement (laxatives), the problem may get worse. Long-term laxative use may cause the muscles of the colon to become weak. A low-fiber diet, not taking in enough fluids, and taking certain medicines may make constipation worse.  CAUSES   Certain medicines, such as antidepressants, pain medicine, iron supplements, antacids, and water pills.   Certain diseases, such as diabetes, irritable bowel syndrome (IBS), thyroid disease, or depression.   Not drinking enough water.   Not eating enough fiber-rich foods.   Stress or travel.   Lack of physical activity or exercise.   Ignoring the urge to have a bowel movement.   Using laxatives too much.  SIGNS AND SYMPTOMS   Having fewer than three bowel movements a week.   Straining to have a bowel movement.   Having stools that are hard, dry, or larger than normal.   Feeling full or bloated.   Pain in the lower abdomen.   Not feeling relief after having a bowel movement.  DIAGNOSIS  Your health care provider will take a medical history and perform a physical exam. Further testing may be done for severe constipation. Some tests may include:  A barium enema X-ray to examine your rectum, colon, and, sometimes, your small intestine.   A sigmoidoscopy to examine your lower colon.   A colonoscopy to examine your entire colon. TREATMENT  Treatment will depend on the severity of your constipation and what is causing it. Some dietary treatments include drinking more fluids and eating more fiber-rich foods. Lifestyle treatments may include regular exercise. If these diet and lifestyle recommendations do not help, your health care provider may recommend taking over-the-counter  laxative medicines to help you have bowel movements. Prescription medicines may be prescribed if over-the-counter medicines do not work.  HOME CARE INSTRUCTIONS   Eat foods that have a lot of fiber, such as fruits, vegetables, whole grains, and beans.  Limit foods high in fat and processed sugars, such as french fries, hamburgers, cookies, candies, and soda.   A fiber supplement may be added to your diet if you cannot get enough fiber from foods.   Drink enough fluids to keep your urine clear or pale yellow.   Exercise regularly or as directed by your health care provider.   Go to the restroom when you have the urge to go. Do not hold it.   Only take over-the-counter or prescription medicines as directed by your health care provider. Do not take other medicines for constipation without talking to your health care provider first.  [place] IF:   You have bright red blood in your stool.   Your constipation lasts for more than 4 days or gets worse.   You have abdominal or rectal pain.   You have thin, pencil-like stools.   You have unexplained weight loss. MAKE SURE YOU:   Understand these instructions.  Will watch your condition.  Will get help right away if you are not doing well or get worse. Document Released: 09/14/2004 Document Revised: 12/22/2013 Document Reviewed: 09/28/2013 ExitCare Patient Information  2015 [employer]. This information is not intended to replace advice given to you by your health care provider. Make sure you discuss any questions you have with your health care provider.

## 2015-05-25 NOTE — ED Provider Notes (Signed)
[facility] Emergency Department Provider Note     Time seen: ----------------------------------------- 10:51 AM on 05/25/2015 -----------------------------------------    I have reviewed the triage vital signs and the nursing notes.   HISTORY  Chief Complaint Emesis and Back Pain    HPI Jesus Guerrero is a 44 y.o. male who presents ER for experiencing nausea vomiting and weakness this morning. He denies any other complaints to me, but had complained of some back pain and urge to urinate. Recently he's had some constipation but did take 2 laxatives this morning was able to have a bowel movement. He is currently not nauseous, denies any back pain this time.    Past Medical History  Diagnosis Date  . Autism   . Prostatitis   . Suspicious nevus   . GERD (gastroesophageal reflux disease)   . Overweight   . UTI (lower urinary tract infection)     Patient Active Problem List   Diagnosis Date Noted  . Autism   . Prostatitis   . GERD (gastroesophageal reflux disease)   . Overweight     Past Surgical History  Procedure Laterality Date  . Cholecystectomy  2014    Current Outpatient Rx  Name  Route  Sig  Dispense  Refill  . HYDROcodone-acetaminophen (NORCO/VICODIN) 5-325 MG per tablet   Oral   Take 1 tablet by mouth every 4 (four) hours as needed for moderate pain. Patient not taking: Reported on 05/04/2015   10 tablet   0   . ondansetron (ZOFRAN) 4 MG tablet   Oral   Take 1 tablet (4 mg total) by mouth daily as needed for nausea or vomiting.   15 tablet   1   . senna-docusate (SENOKOT-S) 8.6-50 MG per tablet   Oral   Take 2 tablets by mouth daily as needed for mild constipation.   30 tablet   1     Allergies Codeine  Family History  Problem Relation Age of Onset  . Congestive Heart Failure Father     Social History History  Substance Use Topics  . Smoking status: Never Smoker   . Smokeless tobacco: Not on file  .  Alcohol Use: No    Review of Systems Constitutional: Negative for fever. Eyes: Negative for visual changes. ENT: Negative for sore throat. Cardiovascular: Negative for chest pain. Respiratory: Negative for shortness of breath. Gastrointestinal: Negative for abdominal pain, positive for vomiting and constipation. Genitourinary: Negative for dysuria. Musculoskeletal: Positive for back pain Skin: Negative for rash. Neurological: Negative for headaches, focal weakness or numbness.  10-point ROS otherwise negative.  ____________________________________________   PHYSICAL EXAM:  VITAL SIGNS: ED Triage Vitals  Enc Vitals Group     BP 05/25/15 0924 111/43 mmHg     Pulse Rate 05/25/15 0924 88     Resp 05/25/15 0924 17     Temp 05/25/15 0924 98.7 F (37.1 C)     Temp Source 05/25/15 0924 Oral     SpO2 05/25/15 0924 100 %     Weight 05/25/15 0924 176 lb (79.833 kg)     Height 05/25/15 0924 5\' 4"  (1.626 m)     Head Cir --      Peak Flow --      Pain Score 05/25/15 0925 0     Pain Loc --      Pain Edu? --      Excl. in [place]? --     Constitutional: Alert and oriented. Well appearing and in no distress.  Eyes: Conjunctivae are normal. PERRL. Normal extraocular movements. ENT   Head: Normocephalic and atraumatic.   Nose: No congestion/rhinnorhea.   Mouth/Throat: Mucous membranes are moist.   Neck: No stridor. Hematological/Lymphatic/Immunilogical: No cervical lymphadenopathy. Cardiovascular: Normal rate, regular rhythm. Normal and symmetric distal pulses are present in all extremities. No murmurs, rubs, or gallops. Respiratory: Normal respiratory effort without tachypnea nor retractions. Breath sounds are clear and equal bilaterally. No wheezes/rales/rhonchi. Gastrointestinal: Soft and nontender. No distention. No abdominal bruits. There is no CVA tenderness. Musculoskeletal: Nontender with normal range of motion in all extremities. No joint effusions.  No lower  extremity tenderness nor edema. Neurologic:  Normal speech and language. No gross focal neurologic deficits are appreciated. Speech is normal. No gait instability. Skin:  Skin is warm, dry and intact. No rash noted. Psychiatric: Mood and affect are normal. Speech and behavior are normal. Patient exhibits appropriate insight and judgment.  ____________________________________________    LABS (pertinent positives/negatives)  Labs Reviewed  URINALYSIS COMPLETEWITH MICROSCOPIC ([place])  - Abnormal; Notable for the following:    Color, Urine STRAW (*)    APPearance CLEAR (*)    Hgb urine dipstick 2+ (*)    Squamous Epithelial / LPF 0-5 (*)    All other components within normal limits  CBC  COMPREHENSIVE METABOLIC PANEL      ____________________________________________  ED COURSE:  Pertinent labs & imaging results that were available during my care of the patient were reviewed by me and considered in my medical decision making (see chart for details).  I'll check basic labs, and reevaluate. ____________________________________________   RADIOLOGY  None  ____________________________________________    FINAL ASSESSMENT AND PLAN  Chills and vomiting   Plan: Patient likely has viral etiology. He is intermittently complaining of some sacral pain associated with urination which I cannot reconcile. Chills and vomiting are probably viral, his complaints and constipation as well. I'll prescribe some MiraLAX form as well as Zofran as needed. Urine is unremarkable this time is stable for outpatient follow-up.    Earleen Newport, MD   Earleen Newport, MD 05/25/15 [phone]

## 2015-05-29 ENCOUNTER — Emergency Department
Admission: EM | Admit: 2015-05-29 | Discharge: 2015-05-30 | Disposition: A | Discharge status: Home / Self Care | Payer: Medicaid Other | Attending: Emergency Medicine | Admitting: Emergency Medicine

## 2015-05-29 ENCOUNTER — Encounter: Payer: Self-pay | Admitting: Emergency Medicine

## 2015-05-29 ENCOUNTER — Other Ambulatory Visit: Payer: Self-pay

## 2015-05-29 DIAGNOSIS — R1031 Right lower quadrant pain: Secondary | ICD-10-CM | POA: Diagnosis not present

## 2015-05-29 DIAGNOSIS — Z79899 Other long term (current) drug therapy: Secondary | ICD-10-CM | POA: Insufficient documentation

## 2015-05-29 DIAGNOSIS — R197 Diarrhea, unspecified: Secondary | ICD-10-CM | POA: Diagnosis not present

## 2015-05-29 DIAGNOSIS — Z9089 Acquired absence of other organs: Secondary | ICD-10-CM | POA: Diagnosis not present

## 2015-05-29 LAB — CBC WITH DIFFERENTIAL/PLATELET
Basophils Absolute: 0 10*3/uL (ref 0–0.1)
Basophils Relative: 1 %
Eosinophils Absolute: 0 10*3/uL (ref 0–0.7)
Eosinophils Relative: 0 %
HEMATOCRIT: 44.3 % (ref 40.0–52.0)
Hemoglobin: 14.8 g/dL (ref 13.0–18.0)
LYMPHS PCT: 24 %
Lymphs Abs: 1.5 10*3/uL (ref 1.0–3.6)
MCH: 29.3 pg (ref 26.0–34.0)
MCHC: 33.5 g/dL (ref 32.0–36.0)
MCV: 87.6 fL (ref 80.0–100.0)
MONO ABS: 0.5 10*3/uL (ref 0.2–1.0)
MONOS PCT: 8 %
NEUTROS ABS: 4.3 10*3/uL (ref 1.4–6.5)
Neutrophils Relative %: 67 %
PLATELETS: 222 10*3/uL (ref 150–440)
RBC: 5.06 MIL/uL (ref 4.40–5.90)
RDW: 13 % (ref 11.5–14.5)
WBC: 6.3 10*3/uL (ref 3.8–10.6)

## 2015-05-29 LAB — URINALYSIS COMPLETE WITH MICROSCOPIC (ARMC ONLY)
Bacteria, UA: NONE SEEN
Bilirubin Urine: NEGATIVE
Glucose, UA: NEGATIVE mg/dL
Leukocytes, UA: NEGATIVE
Nitrite: NEGATIVE
PROTEIN: NEGATIVE mg/dL
SPECIFIC GRAVITY, URINE: 1.013 (ref 1.005–1.030)
Squamous Epithelial / LPF: NONE SEEN
pH: 5 (ref 5.0–8.0)

## 2015-05-29 LAB — COMPREHENSIVE METABOLIC PANEL
ALBUMIN: 4.5 g/dL (ref 3.5–5.0)
ALK PHOS: 76 U/L (ref 38–126)
ALT: 13 U/L — ABNORMAL LOW (ref 17–63)
ANION GAP: 9 (ref 5–15)
AST: 18 U/L (ref 15–41)
BUN: 12 mg/dL (ref 6–20)
CO2: 27 mmol/L (ref 22–32)
Calcium: 9.5 mg/dL (ref 8.9–10.3)
Chloride: 105 mmol/L (ref 101–111)
Creatinine, Ser: 1 mg/dL (ref 0.61–1.24)
GFR calc Af Amer: >60 mL/min (ref >60)
GFR calc non Af Amer: >60 mL/min (ref >60)
Glucose, Bld: 96 mg/dL (ref 65–99)
Potassium: 4.2 mmol/L (ref 3.5–5.1)
Sodium: 141 mmol/L (ref 135–145)
TOTAL PROTEIN: 8.4 g/dL — AB (ref 6.5–8.1)
Total Bilirubin: 1 mg/dL (ref 0.3–1.2)

## 2015-05-29 NOTE — ED Notes (Signed)
Pt states right lower quadrant pain, diarrhea, emesis x1 and nausea for 3 days. Pt with pwd skin, moist oral mucus membranes.

## 2015-05-30 ENCOUNTER — Emergency Department: Payer: Medicaid Other

## 2015-05-30 MED ORDER — FLEET ENEMA 7-19 GM/118ML RE ENEM
1.0000 | ENEMA | Freq: Once | RECTAL | Status: AC
  Administered 2015-05-30: 1 via RECTAL

## 2015-05-30 MED ORDER — KETOROLAC TROMETHAMINE 30 MG/ML IJ SOLN
INTRAMUSCULAR | Status: AC
  Filled 2015-05-30: qty 1

## 2015-05-30 MED ORDER — IOHEXOL 300 MG/ML  SOLN
100.0000 mL | Freq: Once | INTRAMUSCULAR | Status: AC | PRN
  Administered 2015-05-30: 100 mL via INTRAVENOUS

## 2015-05-30 MED ORDER — SODIUM CHLORIDE 0.9 % IV BOLUS (SEPSIS)
1000.0000 mL | Freq: Once | INTRAVENOUS | Status: AC
  Administered 2015-05-30: 1000 mL via INTRAVENOUS

## 2015-05-30 MED ORDER — IOHEXOL 240 MG/ML SOLN
25.0000 mL | Freq: Once | INTRAMUSCULAR | Status: AC | PRN
  Administered 2015-05-30: 25 mL via ORAL

## 2015-05-30 MED ORDER — KETOROLAC TROMETHAMINE 30 MG/ML IJ SOLN
30.0000 mg | Freq: Once | INTRAMUSCULAR | Status: AC
  Administered 2015-05-30: 30 mg via INTRAVENOUS

## 2015-05-30 NOTE — ED Notes (Signed)
Pt. States he was here 5 days ago for the same symptoms.  Pt. States taking miralox with no relief.

## 2015-05-30 NOTE — ED Notes (Signed)
Patient transported to CT 

## 2015-05-30 NOTE — Discharge Instructions (Signed)
Abdominal Pain  Your abdominal pain may be due to constipation, your CT scan is very reassuring. Your bladder is slightly thickened, does not appear that there is infection based on your urine test. I recommend that you follow-up with your primary care physician to discuss her CT scan result and also urology in the future to make sure that he did not have anything such as a tumor causing your bladder to be thickened.  Return to the ER right away if you develop fever, severe increase in pain, vomiting, bloody stool, are unable to pass stool, pain or burning with urination, or other new concerns arise.  Many things can cause abdominal pain. Usually, abdominal pain is not caused by a disease and will improve without treatment. It can often be observed and treated at home. Your health care provider will do a physical exam and possibly order blood tests and X-rays to help determine the seriousness of your pain. However, in many cases, more time must pass before a clear cause of the pain can be found. Before that point, your health care provider may not know if you need more testing or further treatment. HOME CARE INSTRUCTIONS  Monitor your abdominal pain for any changes. The following actions may help to alleviate any discomfort you are experiencing:  Only take over-the-counter or prescription medicines as directed by your health care provider.  Do not take laxatives unless directed to do so by your health care provider.  Try a clear liquid diet (broth, tea, or water) as directed by your health care provider. Slowly move to a bland diet as tolerated. SEEK MEDICAL CARE IF:  You have unexplained abdominal pain.  You have abdominal pain associated with nausea or diarrhea.  You have pain when you urinate or have a bowel movement.  You experience abdominal pain that wakes you in the night.  You have abdominal pain that is worsened or improved by eating food.  You have abdominal pain that is worsened  with eating fatty foods.  You have a fever. SEEK IMMEDIATE MEDICAL CARE IF:   Your pain does not go away within 2 hours.  You keep throwing up (vomiting).  Your pain is felt only in portions of the abdomen, such as the right side or the left lower portion of the abdomen.  You pass bloody or black tarry stools. MAKE SURE YOU:  Understand these instructions.   Will watch your condition.   Will get help right away if you are not doing well or get worse.  Document Released: 09/26/2005 Document Revised: 12/22/2013 Document Reviewed: 08/26/2013 [facility] Patient Information 2015 [place], [place]. This information is not intended to replace advice given to you by your health care provider. Make sure you discuss any questions you have with your health care provider.

## 2015-05-30 NOTE — ED Provider Notes (Signed)
[facility] Emergency Department Provider Note  ____________________________________________  Time seen: Approximately 12:36 AM  I have reviewed the triage vital signs and the nursing notes.   HISTORY  Chief Complaint Abdominal Pain and Diarrhea    HPI Jesus Guerrero is a 44 y.o. male who is been having vomiting off and on for approximately 4 days. Patient was also seen on 525. He states he has been taking Zofran for nausea, but his nausea is improving. At present he is concerned that he is having crampy pain, feels constipated, states he is only having small bowel movements. He also has ongoing pain in the right lower quadrant which is been present for about a week.  He denies testicular pain. No groin pain. No upper abdominal pain. No chest pain, no shortness of breath, no fevers or chills.  Patient states he is occasionally able to eat and hold food down, but sometimes will vomit back up. He had a bowel movement today, which is nonbloody and was not black but it was very small. He is able to pass gas.  Pertinent history of a prior cholecystectomy   Past Medical History  Diagnosis Date  . Autism   . Prostatitis   . Suspicious nevus   . GERD (gastroesophageal reflux disease)   . Overweight   . UTI (lower urinary tract infection)     Patient Active Problem List   Diagnosis Date Noted  . Autism   . Prostatitis   . GERD (gastroesophageal reflux disease)   . Overweight     Past Surgical History  Procedure Laterality Date  . Cholecystectomy  2014    Current Outpatient Rx  Name  Route  Sig  Dispense  Refill  . ibuprofen (ADVIL,MOTRIN) 200 MG tablet   Oral   Take 200 mg by mouth every 6 (six) hours as needed for moderate pain.         Marland Kitchen ondansetron (ZOFRAN) 4 MG tablet   Oral   Take 1 tablet (4 mg total) by mouth daily as needed for nausea or vomiting.   20 tablet   1   . polyethylene glycol (MIRALAX / GLYCOLAX) packet   Oral   Take  17 g by mouth daily.   14 each   0     Allergies Codeine  Family History  Problem Relation Age of Onset  . Congestive Heart Failure Father     Social History History  Substance Use Topics  . Smoking status: Never Smoker   . Smokeless tobacco: Never Used  . Alcohol Use: No    Review of Systems Constitutional: No fever/chills Eyes: No visual changes. ENT: No sore throat. Cardiovascular: Denies chest pain. Respiratory: Denies shortness of breath. Gastrointestinal: See history of present illness No constipation. Genitourinary: Negative for dysuria. Musculoskeletal: Negative for back pain. Skin: Negative for rash. Neurological: Negative for headaches, focal weakness or numbness.  10-point ROS otherwise negative.  ____________________________________________   PHYSICAL EXAM:  VITAL SIGNS: ED Triage Vitals  Enc Vitals Group     BP 05/29/15 2017 120/66 mmHg     Pulse Rate 05/29/15 2017 81     Resp 05/29/15 2017 16     Temp 05/29/15 2017 98.4 F (36.9 C)     Temp Source 05/29/15 2017 Oral     SpO2 05/29/15 2017 100 %     Weight 05/29/15 2017 176 lb (79.833 kg)     Height 05/29/15 2017 5\' 4"  (1.626 m)     Head Cir --  Peak Flow --      Pain Score 05/29/15 2018 8     Pain Loc --      Pain Edu? --      Excl. in [place]? --     Constitutional: Alert and oriented. Well appearing and in no acute distress. Eyes: Conjunctivae are normal. PERRL. EOMI. Head: Atraumatic. Nose: No congestion/rhinnorhea. Mouth/Throat: Mucous membranes are moist.  Oropharynx non-erythematous. Neck: No stridor.   Cardiovascular: Normal rate, regular rhythm. Grossly normal heart sounds.  Good peripheral circulation. Respiratory: Normal respiratory effort.  No retractions. Lungs CTAB. Gastrointestinal: Soft and nontender, except for focal moderate tenderness in the right lower quadrant. Patient describes as a sharp feeling with palpation over the McBurney's point.. No distention. No  abdominal bruits. No CVA tenderness. Bilateral descended nontender testicles. No scrotal swelling. No groin masses. No hernia. Musculoskeletal: No lower extremity tenderness nor edema.  No joint effusions. Neurologic:  Normal speech and language. No gross focal neurologic deficits are appreciated. Speech is normal. No gait instability. Skin:  Skin is warm, dry and intact. No rash noted. Psychiatric: Mood and affect are normal. Speech and behavior are normal.  ____________________________________________   LABS (all labs ordered are listed, but only abnormal results are displayed)  Labs Reviewed  COMPREHENSIVE METABOLIC PANEL - Abnormal; Notable for the following:    Total Protein 8.4 (*)    ALT 13 (*)    All other components within normal limits  URINALYSIS COMPLETEWITH MICROSCOPIC ([facility] ONLY) - Abnormal; Notable for the following:    Color, Urine YELLOW (*)    APPearance CLEAR (*)    Ketones, ur TRACE (*)    Hgb urine dipstick 2+ (*)    All other components within normal limits  CBC WITH DIFFERENTIAL/PLATELET   ____________________________________________  EKG  Date: 05/30/2015  Rate: 77  Rhythm: normal sinus rhythm  QRS Axis: normal  Intervals: normal  ST/T Wave abnormalities: normal  Conduction Disutrbances: none  Narrative Interpretation: unremarkable    ____________________________________________  RADIOLOGY  Mild urinary bladder wall thickening, recommend correlation with urinary analysis.  No specific findings to explain RIGHT upper quadrant pain. Status post cholecystectomy. Normal appendix. ____________________________________________   PROCEDURES  Procedure(s) performed: None  Critical Care performed: No  ____________________________________________   INITIAL IMPRESSION / ASSESSMENT AND PLAN / ED COURSE  Pertinent labs & imaging results that were available during my care of the patient were reviewed by me and considered in my medical decision  making (see chart for details).  Patient with ongoing right lower quadrant pain described as sharp. He was seen in the ER recently for the same, he did not have CT imaging done of the time but was felt possible secondary to constipation. He didn't taking MiraLAX, but still reports very small bowel movements and feeling constipated. He has no fevers or chills, no white count, but he does have focal tenderness over McBurney's point. Given his repeated ER visits and ongoing right lower quadrant pain at this time will obtain CT imaging to evaluate for appendicitis. I feel overall, this is unlikely to represent appendicitis and is more likely some constipation, but given the repeated ER visits and location of pain it seems reasonable at this point to assure that he does not have evidence of appendiceal inflammation or obstructive process.   ____________________________________________   FINAL CLINICAL IMPRESSION(S) / ED DIAGNOSES  Abdominal pain, subsequent, acute,    Delman Kitten, MD 06/02/15 [phone]

## 2015-05-30 NOTE — ED Notes (Signed)
Called to let CT, pt. Finished contrast.

## 2015-06-05 ENCOUNTER — Encounter: Payer: Self-pay | Admitting: Emergency Medicine

## 2015-06-05 ENCOUNTER — Emergency Department
Admission: EM | Admit: 2015-06-05 | Discharge: 2015-06-05 | Disposition: A | Discharge status: Home / Self Care | Payer: Medicaid Other | Attending: Emergency Medicine | Admitting: Emergency Medicine

## 2015-06-05 DIAGNOSIS — Z79899 Other long term (current) drug therapy: Secondary | ICD-10-CM | POA: Insufficient documentation

## 2015-06-05 DIAGNOSIS — N3090 Cystitis, unspecified without hematuria: Secondary | ICD-10-CM | POA: Diagnosis not present

## 2015-06-05 DIAGNOSIS — R1031 Right lower quadrant pain: Secondary | ICD-10-CM | POA: Diagnosis present

## 2015-06-05 LAB — URINALYSIS COMPLETE WITH MICROSCOPIC (ARMC ONLY)
Bacteria, UA: NONE SEEN
Bilirubin Urine: NEGATIVE
GLUCOSE, UA: NEGATIVE mg/dL
Leukocytes, UA: NEGATIVE
Nitrite: NEGATIVE
PROTEIN: NEGATIVE mg/dL
SQUAMOUS EPITHELIAL / LPF: NONE SEEN
Specific Gravity, Urine: 1.01 (ref 1.005–1.030)
pH: 6 (ref 5.0–8.0)

## 2015-06-05 MED ORDER — CIPROFLOXACIN HCL 500 MG PO TABS: 500.0000 mg | ORAL_TABLET | Freq: Two times a day (BID) | ORAL | Status: DC

## 2015-06-05 MED ORDER — CIPROFLOXACIN 500 MG/5ML (10%) PO SUSR: 500.0000 mg | Freq: Two times a day (BID) | ORAL | Status: DC

## 2015-06-05 MED ORDER — TRAMADOL HCL 50 MG PO TABS
50.0000 mg | ORAL_TABLET | Freq: Once | ORAL | Status: AC
  Administered 2015-06-05: 50 mg via ORAL

## 2015-06-05 MED ORDER — TRAMADOL HCL 50 MG PO TABS
ORAL_TABLET | ORAL | Status: AC
  Administered 2015-06-05: 50 mg via ORAL
  Filled 2015-06-05: qty 1

## 2015-06-05 NOTE — Discharge Instructions (Signed)

## 2015-06-05 NOTE — ED Notes (Signed)
Pt informed to return if any life threatening symptoms occur.  

## 2015-06-05 NOTE — ED Notes (Signed)
Pt with abd pain started this am. Denies any n/v/d.

## 2015-06-05 NOTE — ED Notes (Signed)
[employer] called for pt ride without answer. Pt reports he has cell phone and number and he will call for ride.

## 2015-06-05 NOTE — ED Provider Notes (Signed)
[facility] Emergency Department Provider Note  ____________________________________________  Time seen: 8:20 AM  I have reviewed the triage vital signs and the nursing notes.   HISTORY  Chief Complaint Abdominal Pain      HPI Jesus Guerrero is a 44 y.o. male who presents for complaints of moderate right lower abdominal discomfort that is crampy in nature. He was seen here 4 days ago for similar complaints and had a CT scan done which showed a normal appendix. He also notes hard stools and only took the MiraLAX for 1 day and decided it didn't work. He notes a little bit of dysuria as well. No fevers no chills. No nausea no vomiting.     Past Medical History  Diagnosis Date  . Autism   . Prostatitis   . Suspicious nevus   . GERD (gastroesophageal reflux disease)   . Overweight   . UTI (lower urinary tract infection)     Patient Active Problem List   Diagnosis Date Noted  . Autism   . Prostatitis   . GERD (gastroesophageal reflux disease)   . Overweight     Past Surgical History  Procedure Laterality Date  . Cholecystectomy  2014    Current Outpatient Rx  Name  Route  Sig  Dispense  Refill  . ibuprofen (ADVIL,MOTRIN) 200 MG tablet   Oral   Take 200 mg by mouth every 6 (six) hours as needed for moderate pain.         Marland Kitchen ondansetron (ZOFRAN) 4 MG tablet   Oral   Take 1 tablet (4 mg total) by mouth daily as needed for nausea or vomiting.   20 tablet   1   . polyethylene glycol (MIRALAX / GLYCOLAX) packet   Oral   Take 17 g by mouth daily.   14 each   0     Allergies Codeine  Family History  Problem Relation Age of Onset  . Congestive Heart Failure Father     Social History History  Substance Use Topics  . Smoking status: Never Smoker   . Smokeless tobacco: Never Used  . Alcohol Use: No    Review of Systems  Constitutional: Negative for fever. Eyes: Negative for visual changes. ENT: Negative for sore  throat Cardiovascular: Negative for chest pain. Respiratory: Negative for shortness of breath. Gastrointestinal: Negative for vomiting and diarrhea. Genitourinary: Positive for dysuria Musculoskeletal: Negative for back pain. Skin: Negative for rash. Neurological: Negative for headaches or focal weakness   10-point ROS otherwise negative.  ____________________________________________   PHYSICAL EXAM:  VITAL SIGNS: ED Triage Vitals  Enc Vitals Group     BP 06/05/15 0756 135/65 mmHg     Pulse Rate 06/05/15 0756 93     Resp --      Temp 06/05/15 0756 98.2 F (36.8 C)     Temp Source 06/05/15 0756 Oral     SpO2 06/05/15 0756 95 %     Weight 06/05/15 0747 176 lb (79.833 kg)     Height 06/05/15 0747 5\' 7"  (1.702 m)     Head Cir --      Peak Flow --      Pain Score 06/05/15 0747 7     Pain Loc --      Pain Edu? --      Excl. in [place]? --      Constitutional: Alert and oriented. Well appearing and in no distress. Eyes: Conjunctivae are normal. PERRL. ENT   Head: Normocephalic and  atraumatic.   Nose: No rhinnorhea.   Mouth/Throat: Mucous membranes are moist. Cardiovascular: Normal rate, regular rhythm. Normal and symmetric distal pulses are present in all extremities. No murmurs, rubs, or gallops. Respiratory: Normal respiratory effort without tachypnea nor retractions. Breath sounds are clear and equal bilaterally.  Gastrointestinal: Soft and non-tender in all quadrants. No distention. There is no CVA tenderness. Genitourinary: Prostate nontender, no nodules felt Musculoskeletal: Nontender with normal range of motion in all extremities. No lower extremity tenderness nor edema. Neurologic:  Normal speech and language. No gross focal neurologic deficits are appreciated. Skin:  Skin is warm, dry and intact. No rash noted. Psychiatric: Mood and affect are normal. Patient exhibits appropriate insight and judgment.  ____________________________________________    LABS  (pertinent positives/negatives)  Labs Reviewed  URINALYSIS COMPLETEWITH MICROSCOPIC ([place])    ____________________________________________   EKG  None  ____________________________________________    RADIOLOGY  None  ____________________________________________   PROCEDURES  Procedure(s) performed: none  Critical Care performed: none  ____________________________________________   INITIAL IMPRESSION / ASSESSMENT AND PLAN / ED COURSE  Pertinent labs & imaging results that were available during my care of the patient were reviewed by me and considered in my medical decision making (see chart for details).  In reviewing patient's last visit mild wall thickening of the bladder on CT noted and some hematuria. We'll recheck urinalysis today given the possibility of a urinary tract infection and may also need to check the patient's prostate given a history of prostatitis. He is not having tenderness to palpation of the right lower quadrant I'm not worried about appendicitis especially given the normal CT 4 days ago. No kidney stones were demonstrated on CT either.   ----------------------------------------- 9:25 AM on 06/05/2015 -----------------------------------------  Patient well-appearing and comfortable. Prostate exam normal. Given CT evidence of bladder thickening and blood in the urine I will treat for potential urinary tract infection however I strongly urged patient to follow-up with urology for further examination. ____________________________________________   FINAL CLINICAL IMPRESSION(S) / ED DIAGNOSES  Final diagnoses:  Cystitis     Lavonia Drafts, MD 06/05/15 1535

## 2015-06-07 ENCOUNTER — Emergency Department
Admission: EM | Admit: 2015-06-07 | Discharge: 2015-06-07 | Disposition: A | Discharge status: Home / Self Care | Payer: Medicaid Other | Attending: Emergency Medicine | Admitting: Emergency Medicine

## 2015-06-07 ENCOUNTER — Encounter: Payer: Self-pay | Admitting: Emergency Medicine

## 2015-06-07 DIAGNOSIS — R1084 Generalized abdominal pain: Secondary | ICD-10-CM

## 2015-06-07 DIAGNOSIS — R109 Unspecified abdominal pain: Secondary | ICD-10-CM | POA: Diagnosis present

## 2015-06-07 LAB — CBC WITH DIFFERENTIAL/PLATELET
BASOS PCT: 1 %
Basophils Absolute: 0 10*3/uL (ref 0–0.1)
EOS ABS: 0.1 10*3/uL (ref 0–0.7)
Eosinophils Relative: 2 %
HEMATOCRIT: 41.8 % (ref 40.0–52.0)
Hemoglobin: 14.2 g/dL (ref 13.0–18.0)
LYMPHS ABS: 1.3 10*3/uL (ref 1.0–3.6)
Lymphocytes Relative: 17 %
MCH: 29.5 pg (ref 26.0–34.0)
MCHC: 33.9 g/dL (ref 32.0–36.0)
MCV: 87.1 fL (ref 80.0–100.0)
MONOS PCT: 9 %
Monocytes Absolute: 0.7 10*3/uL (ref 0.2–1.0)
NEUTROS PCT: 71 %
Neutro Abs: 5.9 10*3/uL (ref 1.4–6.5)
Platelets: 210 10*3/uL (ref 150–440)
RBC: 4.8 MIL/uL (ref 4.40–5.90)
RDW: 13 % (ref 11.5–14.5)
WBC: 8.1 10*3/uL (ref 3.8–10.6)

## 2015-06-07 LAB — URINALYSIS COMPLETE WITH MICROSCOPIC (ARMC ONLY)
Bilirubin Urine: NEGATIVE
Glucose, UA: NEGATIVE mg/dL
LEUKOCYTES UA: NEGATIVE
Nitrite: NEGATIVE
PROTEIN: NEGATIVE mg/dL
SPECIFIC GRAVITY, URINE: 1.002 — AB (ref 1.005–1.030)
Squamous Epithelial / LPF: NONE SEEN
pH: 7 (ref 5.0–8.0)

## 2015-06-07 LAB — COMPREHENSIVE METABOLIC PANEL
ALT: 13 U/L — ABNORMAL LOW (ref 17–63)
ANION GAP: 8 (ref 5–15)
AST: 15 U/L (ref 15–41)
Albumin: 4.4 g/dL (ref 3.5–5.0)
Alkaline Phosphatase: 71 U/L (ref 38–126)
BUN: <5 mg/dL — ABNORMAL LOW (ref 6–20)
CALCIUM: 9.1 mg/dL (ref 8.9–10.3)
CO2: 28 mmol/L (ref 22–32)
CREATININE: 0.89 mg/dL (ref 0.61–1.24)
Chloride: 99 mmol/L — ABNORMAL LOW (ref 101–111)
GFR calc Af Amer: >60 mL/min (ref >60)
Glucose, Bld: 109 mg/dL — ABNORMAL HIGH (ref 65–99)
Potassium: 4 mmol/L (ref 3.5–5.1)
SODIUM: 135 mmol/L (ref 135–145)
TOTAL PROTEIN: 8 g/dL (ref 6.5–8.1)
Total Bilirubin: 1 mg/dL (ref 0.3–1.2)

## 2015-06-07 LAB — LIPASE, BLOOD: Lipase: 60 U/L — ABNORMAL HIGH (ref 22–51)

## 2015-06-07 NOTE — ED Provider Notes (Signed)
[facility] Emergency Department Provider Note     Time seen: ----------------------------------------- 9:21 PM on 06/07/2015 -----------------------------------------    I have reviewed the triage vital signs and the nursing notes.   HISTORY  Chief Complaint Abdominal Pain    HPI Jesus Guerrero is a 44 y.o. male who presents to ER for abdominal pain. Patient states both flanks are hurting, worse on the left side. Denies fevers chills or other complaints. Recently seen here for same. Pain is mild this time, nothing makes it better or worse. Pain is sharp   Past Medical History  Diagnosis Date  . Autism   . Prostatitis   . Suspicious nevus   . GERD (gastroesophageal reflux disease)   . Overweight   . UTI (lower urinary tract infection)     Patient Active Problem List   Diagnosis Date Noted  . Autism   . Prostatitis   . GERD (gastroesophageal reflux disease)   . Overweight     Past Surgical History  Procedure Laterality Date  . Cholecystectomy  2014    Allergies Codeine  Social History History  Substance Use Topics  . Smoking status: Never Smoker   . Smokeless tobacco: Never Used  . Alcohol Use: No    Review of Systems Constitutional: Negative for fever. Eyes: Negative for visual changes. ENT: Negative for sore throat. Cardiovascular: Negative for chest pain. Respiratory: Negative for shortness of breath Gastrointestinal: Positive for abdominal pain Genitourinary: Negative for dysuria. Musculoskeletal: Negative for back pain. Skin: Negative for rash. Neurological: Negative for headaches, focal weakness or numbness.  10-point ROS otherwise negative.  ____________________________________________   PHYSICAL EXAM:  VITAL SIGNS: ED Triage Vitals  Enc Vitals Group     BP 06/07/15 1956 127/59 mmHg     Pulse Rate 06/07/15 1956 86     Resp 06/07/15 1956 18     Temp 06/07/15 1956 98 F (36.7 C)     Temp Source  06/07/15 1956 Oral     SpO2 06/07/15 1956 97 %     Weight 06/07/15 1956 176 lb (79.833 kg)     Height 06/07/15 1956 5\' 4"  (1.626 m)     Head Cir --      Peak Flow --      Pain Score --      Pain Loc --      Pain Edu? --      Excl. in [place]? --     Constitutional: Alert and oriented. Well appearing and in no distress. Eyes: Conjunctivae are normal. PERRL. Normal extraocular movements. ENT   Head: Normocephalic and atraumatic.   Nose: No congestion/rhinnorhea.   Mouth/Throat: Mucous membranes are moist.   Neck: No stridor. Hematological/Lymphatic/Immunilogical: No cervical lymphadenopathy. Cardiovascular: Normal rate, regular rhythm. Normal and symmetric distal pulses are present in all extremities. No murmurs, rubs, or gallops. Respiratory: Normal respiratory effort without tachypnea nor retractions. Breath sounds are clear and equal bilaterally. No wheezes/rales/rhonchi. Gastrointestinal: Soft and nontender. No distention. No abdominal bruits. There is no CVA tenderness. Musculoskeletal: Nontender with normal range of motion in all extremities. No joint effusions.  No lower extremity tenderness nor edema. Neurologic:  Normal speech and language. No gross focal neurologic deficits are appreciated. Speech is normal. No gait instability. Skin:  Skin is warm, dry and intact. No rash noted. Psychiatric: Mood and affect are normal. Speech and behavior are normal. Patient exhibits appropriate insight and judgment.  ____________________________________________  ED COURSE:  Pertinent labs & imaging results that were available  during my care of the patient were reviewed by me and considered in my medical decision making (see chart for details).  ____________________________________________    LABS (pertinent positives/negatives)  Labs Reviewed  COMPREHENSIVE METABOLIC PANEL - Abnormal; Notable for the following:    Chloride 99 (*)    Glucose, Bld 109 (*)    BUN <5 (*)     ALT 13 (*)    All other components within normal limits  LIPASE, BLOOD - Abnormal; Notable for the following:    Lipase 60 (*)    All other components within normal limits  URINALYSIS COMPLETEWITH MICROSCOPIC ([facility] ONLY) - Abnormal; Notable for the following:    Color, Urine STRAW (*)    APPearance CLEAR (*)    Ketones, ur 1+ (*)    Specific Gravity, Urine 1.002 (*)    Hgb urine dipstick 2+ (*)    Bacteria, UA RARE (*)    All other components within normal limits  CBC WITH DIFFERENTIAL/PLATELET    RADIOLOGY  None  ____________________________________________  FINAL ASSESSMENT AND PLAN  Abdominal pain  Plan: Patient is stable here, normal labs again. Recent visits here for same have been unremarkable including normal CT scan. He is stable for outpatient follow-up   Earleen Newport, MD   Earleen Newport, MD 06/07/15 2125

## 2015-06-07 NOTE — Discharge Instructions (Signed)

## 2015-06-07 NOTE — ED Notes (Signed)
Patient ambulatory to triage with steady gait, without difficulty or distress noted; pt reports left lower abd pain x 2 days; V x 2 times; st hx of same "but can't find anything wrong"; pt has been seen here for same recently

## 2015-06-09 ENCOUNTER — Encounter (INDEPENDENT_AMBULATORY_CARE_PROVIDER_SITE_OTHER): Payer: Self-pay

## 2015-06-09 ENCOUNTER — Ambulatory Visit (INDEPENDENT_AMBULATORY_CARE_PROVIDER_SITE_OTHER): Payer: Medicaid Other | Admitting: Family Medicine

## 2015-06-09 ENCOUNTER — Encounter: Payer: Self-pay | Admitting: Family Medicine

## 2015-06-09 VITALS — BP 133/75 | HR 88 | Temp 97.4°F | Ht 65.3 in | Wt 164.8 lb

## 2015-06-09 DIAGNOSIS — R1031 Right lower quadrant pain: Secondary | ICD-10-CM

## 2015-06-09 DIAGNOSIS — N411 Chronic prostatitis: Secondary | ICD-10-CM | POA: Diagnosis not present

## 2015-06-09 DIAGNOSIS — F419 Anxiety disorder, unspecified: Secondary | ICD-10-CM | POA: Diagnosis not present

## 2015-06-09 DIAGNOSIS — F840 Autistic disorder: Secondary | ICD-10-CM | POA: Diagnosis not present

## 2015-06-09 DIAGNOSIS — R109 Unspecified abdominal pain: Secondary | ICD-10-CM | POA: Insufficient documentation

## 2015-06-09 MED ORDER — PSYLLIUM 28 % PO PACK: 1.0000 | PACK | Freq: Two times a day (BID) | ORAL | Status: DC

## 2015-06-09 NOTE — Assessment & Plan Note (Signed)
Possibly contributing. Will follow up with urology on the 14th.

## 2015-06-09 NOTE — Progress Notes (Signed)
BP 133/75 mmHg  Pulse 88  Temp(Src) 97.4 F (36.3 C)  Ht 5' 5.3" (1.659 m)  Wt 164 lb 12.8 oz (74.753 kg)  BMI 27.16 kg/m2  SpO2 98%   Subjective:    Patient ID: Jesus Guerrero, male    DOB: Nov 04, 1971, 44 y.o.   MRN: [id number]  HPI: Jesus Guerrero is a 44 y.o. male presenting on 06/09/2015 for Abdominal Pain  ER FOLLOW UP- saw the GI doctor- does not need a colonoscopy. Has been there 2x in the past week Time since discharge: 1 day Hospital/facility: [facility] Diagnosis: Functional Abdominal pain Procedures/tests: Labs, CT Consultants: GI- saw them recently New medications: If you  Discharge instructions:   Status: fluctuating  ABDOMINAL PAIN- has been having constipation and then   Duration: Past couple of months Onset: sudden Severity: moderate Quality: sharp and aching Location:  RLQ  Episode duration:  Radiation: no Frequency: intermittent Alleviating factors:  Aggravating factors: Status: fluctuating Treatments attempted: antacids, PPI and H2 Blocker Fever: no Nausea: no Vomiting: yes Weight loss: no Decreased appetite: yes Diarrhea: yes Constipation: yes Blood in stool: no Heartburn: no Jaundice: no Rash: no Dysuria/urinary frequency: no Hematuria: no History of sexually transmitted disease: no Recurrent NSAID use: no   Relevant past medical, surgical, family and social history reviewed and updated as indicated. Interim medical history since our last visit reviewed. Allergies and medications reviewed and updated.  Current Outpatient Prescriptions on File Prior to Visit  Medication Sig  . ciprofloxacin (CIPRO) 500 MG/5ML (10%) suspension Take 5 mLs (500 mg total) by mouth 2 (two) times daily.  Marland Kitchen docusate sodium (COLACE) 100 MG capsule Take 100 mg by mouth daily.  Marland Kitchen ibuprofen (ADVIL,MOTRIN) 200 MG tablet Take 200 mg by mouth every 6 (six) hours as needed for moderate pain.  Marland Kitchen ondansetron (ZOFRAN) 4 MG tablet Take 1 tablet (4 mg total) by mouth daily  as needed for nausea or vomiting.  . polyethylene glycol (MIRALAX / GLYCOLAX) packet Take 17 g by mouth daily. (Patient not taking: Reported on 06/09/2015)   No current facility-administered medications on file prior to visit.    Review of Systems  Constitutional: Negative.   Eyes: Negative.   Respiratory: Negative.   Gastrointestinal: Positive for vomiting, abdominal pain, diarrhea and constipation. Negative for nausea, blood in stool, abdominal distention, anal bleeding and rectal pain.  Genitourinary: Negative.   Musculoskeletal: Negative.   Psychiatric/Behavioral: Positive for sleep disturbance. Negative for suicidal ideas, hallucinations, behavioral problems, confusion, self-injury, dysphoric mood, decreased concentration and agitation. The patient is nervous/anxious. The patient is not hyperactive.    Per HPI unless specifically indicated above    Objective:    BP 133/75 mmHg  Pulse 88  Temp(Src) 97.4 F (36.3 C)  Ht 5' 5.3" (1.659 m)  Wt 164 lb 12.8 oz (74.753 kg)  BMI 27.16 kg/m2  SpO2 98%  Wt Readings from Last 3 Encounters:  06/09/15 164 lb 12.8 oz (74.753 kg)  06/07/15 176 lb (79.833 kg)  06/05/15 176 lb (79.833 kg)    Physical Exam  Constitutional: He appears well-developed and well-nourished.  HENT:  Head: Normocephalic and atraumatic.  Cardiovascular: Normal rate and regular rhythm.   Pulmonary/Chest: Effort normal and breath sounds normal.  Abdominal: Soft. Bowel sounds are normal. He exhibits no distension and no mass. There is tenderness. There is no rebound and no guarding.  Mild tenderness in the RLQ  Skin: Skin is warm and dry.  Psychiatric:  Very anxious and tearful during the visit  Results for orders placed or performed during the hospital encounter of 06/07/15  CBC with Differential  Result Value Ref Range   WBC 8.1 3.8 - 10.6 K/uL   RBC 4.80 4.40 - 5.90 MIL/uL   Hemoglobin 14.2 13.0 - 18.0 g/dL   HCT 41.8 40.0 - 52.0 %   MCV 87.1 80.0 -  100.0 fL   MCH 29.5 26.0 - 34.0 pg   MCHC 33.9 32.0 - 36.0 g/dL   RDW 13.0 11.5 - 14.5 %   Platelets 210 150 - 440 K/uL   Neutrophils Relative % 71 %   Neutro Abs 5.9 1.4 - 6.5 K/uL   Lymphocytes Relative 17 %   Lymphs Abs 1.3 1.0 - 3.6 K/uL   Monocytes Relative 9 %   Monocytes Absolute 0.7 0.2 - 1.0 K/uL   Eosinophils Relative 2 %   Eosinophils Absolute 0.1 0 - 0.7 K/uL   Basophils Relative 1 %   Basophils Absolute 0.0 0 - 0.1 K/uL  Comprehensive metabolic panel  Result Value Ref Range   Sodium 135 135 - 145 mmol/L   Potassium 4.0 3.5 - 5.1 mmol/L   Chloride 99 (L) 101 - 111 mmol/L   CO2 28 22 - 32 mmol/L   Glucose, Bld 109 (H) 65 - 99 mg/dL   BUN <5 (L) 6 - 20 mg/dL   Creatinine, Ser 0.89 0.61 - 1.24 mg/dL   Calcium 9.1 8.9 - 10.3 mg/dL   Total Protein 8.0 6.5 - 8.1 g/dL   Albumin 4.4 3.5 - 5.0 g/dL   AST 15 15 - 41 U/L   ALT 13 (L) 17 - 63 U/L   Alkaline Phosphatase 71 38 - 126 U/L   Total Bilirubin 1.0 0.3 - 1.2 mg/dL   GFR calc non Af Amer >60 >60 mL/min   GFR calc Af Amer >60 >60 mL/min   Anion gap 8 5 - 15  Lipase, blood  Result Value Ref Range   Lipase 60 (H) 22 - 51 U/L  Urinalysis complete, with microscopic ([facility] only)  Result Value Ref Range   Color, Urine STRAW (A) YELLOW   APPearance CLEAR (A) CLEAR   Glucose, UA NEGATIVE NEGATIVE mg/dL   Bilirubin Urine NEGATIVE NEGATIVE   Ketones, ur 1+ (A) NEGATIVE mg/dL   Specific Gravity, Urine 1.002 (L) 1.005 - 1.030   Hgb urine dipstick 2+ (A) NEGATIVE   pH 7.0 5.0 - 8.0   Protein, ur NEGATIVE NEGATIVE mg/dL   Nitrite NEGATIVE NEGATIVE   Leukocytes, UA NEGATIVE NEGATIVE   RBC / HPF 0-5 0 - 5 RBC/hpf   WBC, UA 0-5 0 - 5 WBC/hpf   Bacteria, UA RARE (A) NONE SEEN   Squamous Epithelial / LPF NONE SEEN NONE SEEN      Assessment & Plan:   Problem List Items Addressed This Visit    Autism    Living at the [facility] by their kindness, but very concerned about getting kicked out. Not able to go home. Needs  case manager and social service involvement. Information about social services given today and discussed with his pastor who will get him there to get them involved.       Relevant Orders   Ambulatory referral to Social Work   Prostatitis    Possibly contributing. Will follow up with urology on the 14th.       Abdominal pain - Primary    This appears to be functional and possibly antibiotic induced. Has seen GI and they don't  feel like he needs a colonoscopy at this time. Likely due to stress as well. Will start increased fiber, miralax as needed. Continue to monitor closely and we will check back in with him in 2 weeks.       Acute anxiety    Very anxious and tearful today due to his social and living situation. Advised him to contact social services and take advantage of whatever they have to offer. Encouraged him to speak to his pastor for help. Continue to monitor closely.       Relevant Orders   Ambulatory referral to Social Work       Follow up plan: Return in about 2 weeks (around 06/23/2015).

## 2015-06-09 NOTE — Assessment & Plan Note (Signed)
Living at the [facility] by their kindness, but very concerned about getting kicked out. Not able to go home. Needs case manager and social service involvement. Information about social services given today and discussed with his pastor who will get him there to get them involved.

## 2015-06-09 NOTE — Assessment & Plan Note (Signed)
This appears to be functional and possibly antibiotic induced. Has seen GI and they don't feel like he needs a colonoscopy at this time. Likely due to stress as well. Will start increased fiber, miralax as needed. Continue to monitor closely and we will check back in with him in 2 weeks.

## 2015-06-09 NOTE — Assessment & Plan Note (Signed)
Very anxious and tearful today due to his social and living situation. Advised him to contact social services and take advantage of whatever they have to offer. Encouraged him to speak to his pastor for help. Continue to monitor closely.

## 2015-06-09 NOTE — Patient Instructions (Addendum)
-You have been checked out here and at the ER and by the stomach doctor. At this time, there doesn't seem to be anything seriously wrong with your stomach. This is likely from constipation, issues with taking a lot of antibiotics, eating greasy food and being extra stressed.  - Keep Taking the Cipro until it's gone.  -Make sure to keep your appointment with the urologist on 06/14/15.  - If you feel constipated, take the miralax at night- it should help you have a bowel movement by the next morning.  - Start the metamucil, take that every day. That will help stop the diarrhea, decrease the pain, and stop the constipation.  -If you are concerned about losing your housing and would like to talk to someone about your options from the state- you can talk to social services. Information regarding where you can go and call given to you today. Call them to see if you can help.  -Talk to your pastor to see if he knows of any other options that can help.  -Come back here in 2 weeks to see how you're doing.  Food Choices to Help Relieve Diarrhea When you have diarrhea, the foods you eat and your eating habits are very important. Choosing the right foods and drinks can help relieve diarrhea. Also, because diarrhea can last up to 7 days, you need to replace lost fluids and electrolytes (such as sodium, potassium, and chloride) in order to help prevent dehydration.  WHAT GENERAL GUIDELINES DO I NEED TO FOLLOW?  Slowly drink 1 cup (8 oz) of fluid for each episode of diarrhea. If you are getting enough fluid, your urine will be clear or pale yellow.  Eat starchy foods. Some good choices include white rice, white toast, pasta, low-fiber cereal, baked potatoes (without the skin), saltine crackers, and bagels.  Avoid large servings of any cooked vegetables.  Limit fruit to two servings per day. A serving is  cup or 1 small piece.  Choose foods with less than 2 g of fiber per serving.  Limit fats to less than 8  tsp (38 g) per day.  Avoid fried foods.  Eat foods that have probiotics in them. Probiotics can be found in certain dairy products.  Avoid foods and beverages that may increase the speed at which food moves through the stomach and intestines (gastrointestinal tract). Things to avoid include:  High-fiber foods, such as dried fruit, raw fruits and vegetables, nuts, seeds, and whole grain foods.  Spicy foods and high-fat foods.  Foods and beverages sweetened with high-fructose corn syrup, honey, or sugar alcohols such as xylitol, sorbitol, and mannitol. WHAT FOODS ARE RECOMMENDED? Grains White rice. White, [place], or pita breads (fresh or toasted), including plain rolls, buns, or bagels. White pasta. Saltine, soda, or graham crackers. Pretzels. Low-fiber cereal. Cooked cereals made with water (such as cornmeal, farina, or cream cereals). Plain muffins. Matzo. Melba toast. Zwieback.  Vegetables Potatoes (without the skin). Strained tomato and vegetable juices. Most well-cooked and canned vegetables without seeds. Tender lettuce. Fruits Cooked or canned applesauce, apricots, cherries, fruit cocktail, grapefruit, peaches, pears, or plums. Fresh bananas, apples without skin, cherries, grapes, cantaloupe, grapefruit, peaches, oranges, or plums.  Meat and Other Protein Products Baked or boiled chicken. Eggs. Tofu. Fish. Seafood. Smooth peanut butter. Ground or well-cooked tender beef, ham, veal, lamb, pork, or poultry.  Dairy Plain yogurt, kefir, and unsweetened liquid yogurt. Lactose-free milk, buttermilk, or soy milk. Plain hard cheese. Beverages Sport drinks. Clear broths. Diluted fruit juices (  except prune). Regular, caffeine-free sodas such as ginger ale. Water. Decaffeinated teas. Oral rehydration solutions. Sugar-free beverages not sweetened with sugar alcohols. Other Bouillon, broth, or soups made from recommended foods.  The items listed above may not be a complete list of recommended  foods or beverages. Contact your dietitian for more options. WHAT FOODS ARE NOT RECOMMENDED? Grains Whole grain, whole wheat, bran, or rye breads, rolls, pastas, crackers, and cereals. Wild or brown rice. Cereals that contain more than 2 g of fiber per serving. Corn tortillas or taco shells. Cooked or dry oatmeal. Granola. Popcorn. Vegetables Raw vegetables. Cabbage, broccoli, Brussels sprouts, artichokes, baked beans, beet greens, corn, kale, legumes, peas, sweet potatoes, and yams. Potato skins. Cooked spinach and cabbage. Fruits Dried fruit, including raisins and dates. Raw fruits. Stewed or dried prunes. Fresh apples with skin, apricots, mangoes, pears, raspberries, and strawberries.  Meat and Other Protein Products Chunky peanut butter. Nuts and seeds. Beans and lentils. Berniece Salines.  Dairy High-fat cheeses. Milk, chocolate milk, and beverages made with milk, such as milk shakes. Cream. Ice cream. Sweets and Desserts Sweet rolls, doughnuts, and sweet breads. Pancakes and waffles. Fats and Oils Butter. Cream sauces. Margarine. Salad oils. Plain salad dressings. Olives. Avocados.  Beverages Caffeinated beverages (such as coffee, tea, soda, or energy drinks). Alcoholic beverages. Fruit juices with pulp. Prune juice. Soft drinks sweetened with high-fructose corn syrup or sugar alcohols. Other Coconut. Hot sauce. Chili powder. Mayonnaise. Gravy. Cream-based or milk-based soups.  The items listed above may not be a complete list of foods and beverages to avoid. Contact your dietitian for more information. WHAT SHOULD I DO IF I BECOME DEHYDRATED? Diarrhea can sometimes lead to dehydration. Signs of dehydration include dark urine and dry mouth and skin. If you think you are dehydrated, you should rehydrate with an oral rehydration solution. These solutions can be purchased at pharmacies, retail stores, or online.  Drink -1 cup (120-240 mL) of oral rehydration solution each time you have an episode of  diarrhea. If drinking this amount makes your diarrhea worse, try drinking smaller amounts more often. For example, drink 1-3 tsp (5-15 mL) every 5-10 minutes.  A general rule for staying hydrated is to drink 1-2 L of fluid per day. Talk to your health care provider about the specific amount you should be drinking each day. Drink enough fluids to keep your urine clear or pale yellow. Document Released: 03/08/2004 Document Revised: 12/22/2013 Document Reviewed: 11/09/2013 [facility] Patient Information 2015 [place], [place]. This information is not intended to replace advice given to you by your health care provider. Make sure you discuss any questions you have with your health care provider.  Stress and Stress Management Stress is a normal reaction to life events. It is what you feel when life demands more than you are used to or more than you can handle. Some stress can be useful. For example, the stress reaction can help you catch the last bus of the day, study for a test, or meet a deadline at work. But stress that occurs too often or for too long can cause problems. It can affect your emotional health and interfere with relationships and normal daily activities. Too much stress can weaken your immune system and increase your risk for physical illness. If you already have a medical problem, stress can make it worse. CAUSES  All sorts of life events may cause stress. An event that causes stress for one person may not be stressful for another person. Major life events commonly  cause stress. These may be positive or negative. Examples include losing your job, moving into a new home, getting married, having a baby, or losing a loved one. Less obvious life events may also cause stress, especially if they occur day after day or in combination. Examples include working long hours, driving in traffic, caring for children, being in debt, or being in a difficult relationship. SIGNS AND SYMPTOMS Stress may cause  emotional symptoms including, the following:  Anxiety. This is feeling worried, afraid, on edge, overwhelmed, or out of control.  Anger. This is feeling irritated or impatient.  Depression. This is feeling sad, down, helpless, or guilty.  Difficulty focusing, remembering, or making decisions. Stress may cause physical symptoms, including the following:   Aches and pains. These may affect your head, neck, back, stomach, or other areas of your body.  Tight muscles or clenched jaw.  Low energy or trouble sleeping. Stress may cause unhealthy behaviors, including the following:   Eating to feel better (overeating) or skipping meals.  Sleeping too little, too much, or both.  Working too much or putting off tasks (procrastination).  Smoking, drinking alcohol, or using drugs to feel better. DIAGNOSIS  Stress is diagnosed through an assessment by your health care provider. Your health care provider will ask questions about your symptoms and any stressful life events.Your health care provider will also ask about your medical history and may order blood tests or other tests. Certain medical conditions and medicine can cause physical symptoms similar to stress. Mental illness can cause emotional symptoms and unhealthy behaviors similar to stress. Your health care provider may refer you to a mental health professional for further evaluation.  TREATMENT  Stress management is the recommended treatment for stress.The goals of stress management are reducing stressful life events and coping with stress in healthy ways.  Techniques for reducing stressful life events include the following:  Stress identification. Self-monitor for stress and identify what causes stress for you. These skills may help you to avoid some stressful events.  Time management. Set your priorities, keep a calendar of events, and learn to say "no." These tools can help you avoid making too many commitments. Techniques for  coping with stress include the following:  Rethinking the problem. Try to think realistically about stressful events rather than ignoring them or overreacting. Try to find the positives in a stressful situation rather than focusing on the negatives.  Exercise. Physical exercise can release both physical and emotional tension. The key is to find a form of exercise you enjoy and do it regularly.  Relaxation techniques. These relax the body and mind. Examples include yoga, meditation, tai chi, biofeedback, deep breathing, progressive muscle relaxation, listening to music, being out in nature, journaling, and other hobbies. Again, the key is to find one or more that you enjoy and can do regularly.  Healthy lifestyle. Eat a balanced diet, get plenty of sleep, and do not smoke. Avoid using alcohol or drugs to relax.  Strong support network. Spend time with family, friends, or other people you enjoy being around.Express your feelings and talk things over with someone you trust. Counseling or talktherapy with a mental health professional may be helpful if you are having difficulty managing stress on your own. Medicine is typically not recommended for the treatment of stress.Talk to your health care provider if you think you need medicine for symptoms of stress. HOME CARE INSTRUCTIONS  Keep all follow-up visits as directed by your health care provider.  Take all medicines  as directed by your health care provider. SEEK MEDICAL CARE IF:  Your symptoms get worse or you start having new symptoms.  You feel overwhelmed by your problems and can no longer manage them on your own. SEEK IMMEDIATE MEDICAL CARE IF:  You feel like hurting yourself or someone else. Document Released: 06/12/2001 Document Revised: 05/03/2014 Document Reviewed: 08/11/2013 [facility] Patient Information 2015 [place], [place]. This information is not intended to replace advice given to you by your health care provider. Make sure you  discuss any questions you have with your health care provider.

## 2015-06-13 ENCOUNTER — Other Ambulatory Visit: Payer: Self-pay | Admitting: Urology

## 2015-06-13 DIAGNOSIS — N3289 Other specified disorders of bladder: Secondary | ICD-10-CM

## 2015-06-14 ENCOUNTER — Ambulatory Visit (INDEPENDENT_AMBULATORY_CARE_PROVIDER_SITE_OTHER): Payer: Medicaid Other | Admitting: Urology

## 2015-06-14 ENCOUNTER — Encounter: Payer: Self-pay | Admitting: Urology

## 2015-06-14 VITALS — BP 121/70 | HR 96 | Ht 64.0 in | Wt 156.6 lb

## 2015-06-14 DIAGNOSIS — R312 Other microscopic hematuria: Secondary | ICD-10-CM

## 2015-06-14 DIAGNOSIS — N3289 Other specified disorders of bladder: Secondary | ICD-10-CM | POA: Diagnosis not present

## 2015-06-14 DIAGNOSIS — R3129 Other microscopic hematuria: Secondary | ICD-10-CM

## 2015-06-14 LAB — URINALYSIS, COMPLETE
BILIRUBIN UA: NEGATIVE
Glucose, UA: NEGATIVE
Leukocytes, UA: NEGATIVE
NITRITE UA: NEGATIVE
Protein, UA: NEGATIVE
UUROB: 0.2 mg/dL (ref 0.2–1.0)
pH, UA: 6 (ref 5.0–7.5)

## 2015-06-14 LAB — MICROSCOPIC EXAMINATION: BACTERIA UA: NONE SEEN

## 2015-06-14 LAB — BLADDER SCAN AMB NON-IMAGING: SCAN RESULT: 0

## 2015-06-14 NOTE — Progress Notes (Signed)
06/14/2015 9:35 PM   Jesus Guerrero June 02, 1971 [id number]  Referring provider: Valerie Roys, DO [place],  [id number]  Chief Complaint  Patient presents with  . Bladder Thickening on CT scan    Referred by emergency room    HPI: Jesus Guerrero is a 44 year old white male with autism who presented to the emergency room on 06/07/2015 with bilateral flank pain.  An abdominal  and pelvic CT with contrast was performed and a mild circumferential bladder thickening was noted. He was then referred to [place] for further evaluation.  Patient states he was told he had constipation and an UTI and was given antibiotics. His UA 2 weeks ago and 10 days ago had 0-5 RBCs per high-power field. On today's UA, he had 3-10 RBCs per high power field. A urine culture had not yet been performed. Patient denied any gross hematuria  He complains of a few months history of a weak dysuria and getting up 1-3 times a night to urinate.  He also has a remote history of spontaneous passage of a kidney stone.  IPSS score was 5/5.  His PVR was 0cc.        IPSS      06/14/15 1100       International Prostate Symptom Score   How often have you had the sensation of not emptying your bladder? Not at All     How often have you had to urinate less than every two hours? Not at All     How often have you found you stopped and started again several times when you urinated? Not at All     How often have you found it difficult to postpone urination? Not at All     How often have you had a weak urinary stream? Not at All     How often have you had to strain to start urination? Not at All     How many times did you typically get up at night to urinate? 5 Times     Total IPSS Score 5     Quality of Life due to urinary symptoms   If you were to spend the rest of your life with your urinary condition just the way it is now how would you feel about that? Unhappy          PMH: Past Medical History  Diagnosis Date    . Autism   . Prostatitis   . Suspicious nevus   . GERD (gastroesophageal reflux disease)   . Overweight   . UTI (lower urinary tract infection)   . Constipation   . Gout   . High cholesterol   . History of kidney stones     Surgical History: Past Surgical History  Procedure Laterality Date  . Cholecystectomy  2014    Home Medications:    Medication List       This list is accurate as of: 06/14/15 11:59 PM.  Always use your most recent med list.               ciprofloxacin 500 MG/5ML (10%) suspension  Commonly known as:  CIPRO  Take 5 mLs (500 mg total) by mouth 2 (two) times daily.     docusate sodium 100 MG capsule  Commonly known as:  COLACE  Take 100 mg by mouth daily.     ibuprofen 200 MG tablet  Commonly known as:  ADVIL,MOTRIN  Take 200 mg by mouth every 6 (  six) hours as needed for moderate pain.     loratadine 10 MG tablet  Commonly known as:  CLARITIN  Take 10 mg by mouth daily.     ondansetron 4 MG tablet  Commonly known as:  ZOFRAN  Take 1 tablet (4 mg total) by mouth daily as needed for nausea or vomiting.     polyethylene glycol packet  Commonly known as:  MIRALAX / GLYCOLAX  Take 17 g by mouth daily.     psyllium 28 % packet  Commonly known as:  METAMUCIL SMOOTH TEXTURE  Take 1 packet by mouth 2 (two) times daily.        Allergies:  Allergies  Allergen Reactions  . Codeine Swelling    Family History: Family History  Problem Relation Age of Onset  . Congestive Heart Failure Father   . Kidney failure Father     Social History:  reports that he has never smoked. He has never used smokeless tobacco. He reports that he does not drink alcohol or use illicit drugs.  ROS: Urological Symptom Review  Patient is experiencing the following symptoms: Burning/pain with urination Get up at night to urinate Blood in urine Urinary tract infection   Review of Systems  Gastrointestinal (upper)   : Nausea Vomiting Indigestion/heartburn  Gastrointestinal (lower) : Diarrhea Constipation  Constitutional : Fatigue  Skin: Skin rash/lesion  Eyes: Negative for eye symptoms  Ear/Nose/Throat : Sinus problems  Hematologic/Lymphatic: Negative for Hematologic/Lymphatic symptoms  Cardiovascular : Negative for cardiovascular symptoms  Respiratory : Cough  Endocrine: Negative for endocrine symptoms  Musculoskeletal: Back pain  Neurological: Dizziness  Psychologic: Negative for psychiatric symptoms   Physical Exam: BP 121/70 mmHg  Pulse 96  Ht 5\' 4"  (1.626 m)  Wt 156 lb 9.6 oz (71.033 kg)  BMI 26.87 kg/m2  Constitutional:  Alert and oriented, No acute distress. HEENT: [place] AT, moist mucus membranes.  Trachea midline, no masses. Cardiovascular: No clubbing, cyanosis, or edema. Respiratory: Normal respiratory effort, no increased work of breathing. GI: Abdomen is soft, non tender, non distended, no abdominal masses GU: No CVA tenderness.  Patient with an uncircumcised  phallus. Foreskin easily retracted.  Urethral meatus is patent.  No penile discharge. No penile lesions or rashes. Scrotum without lesions, cysts, rashes and/or edema.  Testicles are located scrotally bilaterally. No masses are appreciated in the testicles. Left and right epididymis are normal. Rectal: Patient with  normal sphincter tone. Perineum without scarring or rashes. No rectal masses are appreciated. Prostate is approximately 50 grams, no nodules are appreciated. Seminal vesicles are normal. Skin: No rashes, bruises or suspicious lesions. Lymph: No cervical or inguinal adenopathy. Neurologic: Grossly intact, no focal deficits, moving all 4 extremities. Psychiatric: Normal mood and affect.  Laboratory Data: Results for orders placed or performed in visit on 06/14/15  Microscopic Examination  Result Value Ref Range   WBC, UA 0-5 0 -  5 /hpf   RBC, UA 3-10 (A) 0 -  2 /hpf   Epithelial Cells  (non renal) 0-10 0 - 10 /hpf   Bacteria, UA None seen None seen/Few  Urinalysis, Complete  Result Value Ref Range   Specific Gravity, UA <1.005 (L) 1.005 - 1.030   pH, UA 6.0 5.0 - 7.5   Color, UA Yellow Yellow   Appearance Ur Clear Clear   Leukocytes, UA Negative Negative   Protein, UA Negative Negative/Trace   Glucose, UA Negative Negative   Ketones, UA 1+ (A) Negative   RBC, UA 2+ (A) Negative  Bilirubin, UA Negative Negative   Urobilinogen, Ur 0.2 0.2 - 1.0 mg/dL   Nitrite, UA Negative Negative   Microscopic Examination See below:   BLADDER SCAN AMB NON-IMAGING  Result Value Ref Range   Scan Result 0    Lab Results  Component Value Date   WBC 8.1 06/07/2015   HGB 14.2 06/07/2015   HCT 41.8 06/07/2015   MCV 87.1 06/07/2015   PLT 210 06/07/2015    Lab Results  Component Value Date   CREATININE 0.89 06/07/2015    No results found for: PSA  No results found for: TESTOSTERONE  No results found for: HGBA1C  Urinalysis    Component Value Date/Time   COLORURINE STRAW* 06/07/2015 2045   APPEARANCEUR CLEAR* 06/07/2015 2045   LABSPEC 1.002* 06/07/2015 2045   PHURINE 7.0 06/07/2015 2045   GLUCOSEU Negative 06/14/2015 1124   HGBUR 2+* 06/07/2015 2045   BILIRUBINUR Negative 06/14/2015 1124   BILIRUBINUR NEGATIVE 06/07/2015 2045   KETONESUR 1+* 06/07/2015 2045   PROTEINUR NEGATIVE 06/07/2015 2045   NITRITE Negative 06/14/2015 1124   NITRITE NEGATIVE 06/07/2015 2045   LEUKOCYTESUR Negative 06/14/2015 1124   LEUKOCYTESUR NEGATIVE 06/07/2015 2045    Pertinent Imaging: CLINICAL DATA: RIGHT upper quadrant pain, diarrhea, vomiting and nausea for 3 days. History of cholecystectomy.  EXAM: CT ABDOMEN AND PELVIS WITH CONTRAST  TECHNIQUE: Multidetector CT imaging of the abdomen and pelvis was performed using the standard protocol following bolus administration of intravenous contrast.  CONTRAST: 161mL OMNIPAQUE IOHEXOL 300 MG/ML SOLN  COMPARISON:  Abdominal radiograph May 07, 2015 and CT abdomen and pelvis February 25, 2015  FINDINGS: LUNG BASES: Included view of the lung bases are clear. Visualized heart and pericardium are unremarkable.  SOLID ORGANS: The liver, spleen, and adrenal glands are unremarkable. Status post cholecystectomy. Mildly atrophic pancreas.  GASTROINTESTINAL TRACT: Small hiatal hernia. The stomach, small and large bowel are normal in course and caliber without inflammatory changes. Normal appendix.  KIDNEYS/ URINARY TRACT: Kidneys are orthotopic, demonstrating symmetric enhancement. No nephrolithiasis, hydronephrosis or solid renal masses. Too small to characterize hypodensities in the kidneys bilaterally. The unopacified ureters are normal in course and caliber. Delayed imaging through the kidneys demonstrates symmetric prompt contrast excretion within the proximal urinary collecting system. Urinary bladder is well distended with mild circumferential wall thickening.  PERITONEUM/RETROPERITONEUM: Aortoiliac vessels are normal in course and caliber. No lymphadenopathy by CT size criteria. Prostate is more. No intraperitoneal free fluid nor free air.  SOFT TISSUE/OSSEOUS STRUCTURES: Non-suspicious. Small LEFT greater than RIGHT fat containing inguinal hernias. Mild broad dextroscoliosis on this nonweightbearing examination.  IMPRESSION: Mild urinary bladder wall thickening, recommend correlation with urinary analysis.  No specific findings to explain RIGHT upper quadrant pain. Status post cholecystectomy. Normal appendix.   Electronically Signed  By: Elon Alas M.D.  On: 05/30/2015 02:44    Assessment & Plan:     1. Bladder wall thickening-  Patient's urine will be sent for culture. He will need to undergo cystoscopy.  2. Microscopic hematuria-  Patient has had 3 incidences of microscopic hematuria. His UA 2 weeks ago and 10 days ago had 0-5 RBCs per high-power field. On  today's UA, he had 3-10 RBCs per high power field. A urine culture had not yet been performed. Patient denied any gross hematuria.  Explained to patient the causes of blood in the urine are as follows: stones, BPH, UTI's, damage to the urinary tract and/or cancer. Patient will be scheduled for cystoscopy with bilateral retrogrades with possible biopsies and/or  ureteral stent placement.     - CULTURE, URINE COMPREHENSIVE - Urinalysis, Complete - BLADDER SCAN AMB NON-IMAGING   No Follow-up on file.  Zara Council, [facility] [address] [place], [place] [address] [phone]

## 2015-06-15 DIAGNOSIS — R3129 Other microscopic hematuria: Secondary | ICD-10-CM | POA: Insufficient documentation

## 2015-06-15 DIAGNOSIS — N3289 Other specified disorders of bladder: Secondary | ICD-10-CM | POA: Insufficient documentation

## 2015-06-17 LAB — CULTURE, URINE COMPREHENSIVE

## 2015-06-20 ENCOUNTER — Telehealth: Payer: Self-pay

## 2015-06-20 NOTE — Telephone Encounter (Signed)
-----   Message from Nori Riis, PA-C sent at 06/20/2015  9:23 AM EDT ----- I have left a message for Mikki Santee ?[place] concerning Mr. Ackert.  His UCx is negative ( 1,000K is not significant).  He needs to have cystoscopy with bilateral retrogrades with possible biopsy with possible ureteral stent placement in the OR.  If you happen to speak to Mikki Santee, will you make him aware of this and the risks involved for Mr. Yanik.

## 2015-06-20 NOTE — Telephone Encounter (Signed)
I spoke w/ Mikki Santee and informed him that Mr. Jesus Guerrero needs to be scheduled for Cystoscopy w/bilateral retrogrades etc. in the OR. He informed me that he will speak w/ the pt and call me back.

## 2015-06-21 ENCOUNTER — Telehealth: Payer: Self-pay | Admitting: Urology

## 2015-06-21 NOTE — Telephone Encounter (Signed)
I spoke w/ Mikki Santee the pt emergency contact person and informed him of the Pre-admit appt on 6/23 (phone encounter). I also asked him if he was the pt POA or legal guardian. He seems to think he has signed something to give him rights. He will check his records and call me back. I called [facility] and they informed me that they didn't have any legal documentation on him. If Mikki Santee cannot locate any documentation I will give him the information of the chaplain services at [facility] so he can take the pt over to sign a Advance Directory.

## 2015-06-23 ENCOUNTER — Telehealth: Payer: Self-pay | Admitting: Urology

## 2015-06-23 ENCOUNTER — Other Ambulatory Visit: Payer: Medicaid Other

## 2015-06-23 ENCOUNTER — Encounter: Payer: Self-pay | Admitting: *Deleted

## 2015-06-23 DIAGNOSIS — K219 Gastro-esophageal reflux disease without esophagitis: Secondary | ICD-10-CM | POA: Diagnosis not present

## 2015-06-23 DIAGNOSIS — Z8249 Family history of ischemic heart disease and other diseases of the circulatory system: Secondary | ICD-10-CM | POA: Diagnosis not present

## 2015-06-23 DIAGNOSIS — K5900 Constipation, unspecified: Secondary | ICD-10-CM | POA: Diagnosis not present

## 2015-06-23 DIAGNOSIS — E780 Pure hypercholesterolemia: Secondary | ICD-10-CM | POA: Diagnosis not present

## 2015-06-23 DIAGNOSIS — Z79899 Other long term (current) drug therapy: Secondary | ICD-10-CM | POA: Diagnosis not present

## 2015-06-23 DIAGNOSIS — R300 Dysuria: Secondary | ICD-10-CM | POA: Diagnosis not present

## 2015-06-23 DIAGNOSIS — R197 Diarrhea, unspecified: Secondary | ICD-10-CM | POA: Diagnosis not present

## 2015-06-23 DIAGNOSIS — Z87442 Personal history of urinary calculi: Secondary | ICD-10-CM | POA: Diagnosis not present

## 2015-06-23 DIAGNOSIS — E663 Overweight: Secondary | ICD-10-CM | POA: Diagnosis not present

## 2015-06-23 DIAGNOSIS — N419 Inflammatory disease of prostate, unspecified: Secondary | ICD-10-CM | POA: Diagnosis not present

## 2015-06-23 DIAGNOSIS — Z885 Allergy status to narcotic agent status: Secondary | ICD-10-CM | POA: Diagnosis not present

## 2015-06-23 DIAGNOSIS — R312 Other microscopic hematuria: Secondary | ICD-10-CM | POA: Diagnosis not present

## 2015-06-23 DIAGNOSIS — M109 Gout, unspecified: Secondary | ICD-10-CM | POA: Diagnosis not present

## 2015-06-23 DIAGNOSIS — Z9049 Acquired absence of other specified parts of digestive tract: Secondary | ICD-10-CM | POA: Diagnosis not present

## 2015-06-23 DIAGNOSIS — F840 Autistic disorder: Secondary | ICD-10-CM | POA: Diagnosis not present

## 2015-06-23 NOTE — OR Nursing (Signed)
Lives at [facility]. Elmarie Shiley provided preop info. Will be bringing him day of surgery

## 2015-06-23 NOTE — Telephone Encounter (Signed)
I called back and spoke w Lynne Leader Mikki Santee) concerning him having authorizing to make medical decision concerning the pts health. He informed me that the pt is competent to make medical decisions on his on, but he is currently his financial payee. I told him I will discuss this with Dr. Erlene Quan, but another option would be to go see the chaplain at [facility] and sign an Advance Directory.

## 2015-06-23 NOTE — Telephone Encounter (Signed)
I spoke w/ Jesus Guerrero and informed him of Dr. Cherrie Gauze recommendation to get the [employer]. I explained to him that he need to go to the medical mall and go up to the help desk and ask for chaplain and they will walk him through the process.

## 2015-06-23 NOTE — Patient Instructions (Signed)
  Your procedure is scheduled on:06/29/15 Report to Day Surgery. To find out your arrival time please call [phone] between 1PM - 3PM on 06/28/15.  Remember: Instructions that are not followed completely may result in serious medical risk, up to and including death, or upon the discretion of your surgeon and anesthesiologist your surgery may need to be rescheduled.    _x___ 1. Do not eat food or drink liquids after midnight. No gum chewing or hard candies.     _x___ 2. No Alcohol for 24 hours before or after surgery.   ____ 3. Bring all medications with you on the day of surgery if instructed.    __x__ 4. Notify your doctor if there is any change in your medical condition     (cold, fever, infections).     Do not wear jewelry, make-up, hairpins, clips or nail polish.  Do not wear lotions, powders, or perfumes. You may wear deodorant.  Do not shave 48 hours prior to surgery. Men may shave face and neck.  Do not bring valuables to the hospital.    [facility] is not responsible for any belongings or valuables.               Contacts, dentures or bridgework may not be worn into surgery.  Leave your suitcase in the car. After surgery it may be brought to your room.  For patients admitted to the hospital, discharge time is determined by your                treatment team.   Patients discharged the day of surgery will not be allowed to drive home.   Please read over the following fact sheets that you were given:   Surgical Site Infection Prevention   ____ Take these medicines the morning of surgery with A SIP OF WATER:    1.   2.   3.   4.  5.  6.  ____ Fleet Enema (as directed)   ____ Use CHG Soap as directed  ____ Use inhalers on the day of surgery  ____ Stop metformin 2 days prior to surgery    ____ Take 1/2 of usual insulin dose the night before surgery and none on the morning of surgery.   ____ Stop Coumadin/Plavix/aspirin on  ____ Stop Anti-inflammatories on    ____ Stop supplements until after surgery.    ____ Bring C-Pap to the hospital.

## 2015-06-24 ENCOUNTER — Ambulatory Visit: Payer: Medicaid Other | Admitting: Family Medicine

## 2015-06-27 NOTE — Telephone Encounter (Signed)
The pt is notified of the procedure and we are awaiting a [employer]. Please see previous message.

## 2015-06-27 NOTE — Telephone Encounter (Signed)
I called back and spoke with Jesus Guerrero and he informed me that he did get the [employer] done Friday, June 24th. He will fax over a copy today Monday, June 27th.

## 2015-06-28 NOTE — H&P (Signed)
06/14/2015 9:35 PM   Jesus Guerrero Aug 14, 1971 [id number]  Referring provider: Valerie Roys, DO [place], [place] [address]  Chief Complaint  Patient presents with  . Bladder Thickening on CT scan    Referred by emergency room    HPI: Jesus Guerrero is a 44 year old white male with autism who presented to the emergency room on 06/07/2015 with bilateral flank pain. An abdominal and pelvic CT with contrast was performed and a mild circumferential bladder thickening was noted. He was then referred to [place] for further evaluation. Patient states he was told he had constipation and an UTI and was given antibiotics. His UA 2 weeks ago and 10 days ago had 0-5 RBCs per high-power field. On today's UA, he had 3-10 RBCs per high power field. A urine culture had not yet been performed. Patient denied any gross hematuria  He complains of a few months history of a weak dysuria and getting up 1-3 times a night to urinate. He also has a remote history of spontaneous passage of a kidney stone. IPSS score was 5/5. His PVR was 0cc.       IPSS     06/14/15 1100     International Prostate Symptom Score   How often have you had the sensation of not emptying your bladder? Not at All     How often have you had to urinate less than every two hours? Not at All     How often have you found you stopped and started again several times when you urinated? Not at All     How often have you found it difficult to postpone urination? Not at All     How often have you had a weak urinary stream? Not at All     How often have you had to strain to start urination? Not at All     How many times did you typically get up at night to urinate? 5 Times     Total IPSS Score 5     Quality of Life due to urinary symptoms   If you were to spend the rest of your life with your urinary condition just the way it is now how would you feel about that?  Unhappy          PMH: Past Medical History  Diagnosis Date  . Autism   . Prostatitis   . Suspicious nevus   . GERD (gastroesophageal reflux disease)   . Overweight   . UTI (lower urinary tract infection)   . Constipation   . Gout   . High cholesterol   . History of kidney stones     Surgical History: Past Surgical History  Procedure Laterality Date  . Cholecystectomy  2014    Home Medications:    Medication List       This list is accurate as of: 06/14/15 11:59 PM. Always use your most recent med list.              ciprofloxacin 500 MG/5ML (10%) suspension  Commonly known as: CIPRO  Take 5 mLs (500 mg total) by mouth 2 (two) times daily.     docusate sodium 100 MG capsule  Commonly known as: COLACE  Take 100 mg by mouth daily.     ibuprofen 200 MG tablet  Commonly known as: ADVIL,MOTRIN  Take 200 mg by mouth every 6 (six) hours as needed for moderate pain.     loratadine 10 MG tablet  Commonly known as: CLARITIN  Take 10 mg by mouth daily.     ondansetron 4 MG tablet  Commonly known as: ZOFRAN  Take 1 tablet (4 mg total) by mouth daily as needed for nausea or vomiting.     polyethylene glycol packet  Commonly known as: MIRALAX / GLYCOLAX  Take 17 g by mouth daily.     psyllium 28 % packet  Commonly known as: METAMUCIL SMOOTH TEXTURE  Take 1 packet by mouth 2 (two) times daily.        Allergies:  Allergies  Allergen Reactions  . Codeine Swelling    Family History: Family History  Problem Relation Age of Onset  . Congestive Heart Failure Father   . Kidney failure Father     Social History:  reports that he has never smoked. He has never used smokeless tobacco. He reports that he does not drink alcohol or use illicit drugs.  ROS: Urological Symptom Review  Patient is experiencing the following  symptoms: Burning/pain with urination Get up at night to urinate Blood in urine Urinary tract infection   Review of Systems  Gastrointestinal (upper) : Nausea Vomiting Indigestion/heartburn  Gastrointestinal (lower) : Diarrhea Constipation  Constitutional : Fatigue  Skin: Skin rash/lesion  Eyes: Negative for eye symptoms  Ear/Nose/Throat : Sinus problems  Hematologic/Lymphatic: Negative for Hematologic/Lymphatic symptoms  Cardiovascular : Negative for cardiovascular symptoms  Respiratory : Cough  Endocrine: Negative for endocrine symptoms  Musculoskeletal: Back pain  Neurological: Dizziness  Psychologic: Negative for psychiatric symptoms   Physical Exam: BP 121/70 mmHg  Pulse 96  Ht 5\' 4"  (1.626 m)  Wt 156 lb 9.6 oz (71.033 kg)  BMI 26.87 kg/m2  Constitutional: Alert and oriented, No acute distress. HEENT: [place] AT, moist mucus membranes. Trachea midline, no masses. Cardiovascular: No clubbing, cyanosis, or edema. Respiratory: Normal respiratory effort, no increased work of breathing. GI: Abdomen is soft, non tender, non distended, no abdominal masses GU: No CVA tenderness. Patient with an uncircumcised phallus. Foreskin easily retracted. Urethral meatus is patent. No penile discharge. No penile lesions or rashes. Scrotum without lesions, cysts, rashes and/or edema. Testicles are located scrotally bilaterally. No masses are appreciated in the testicles. Left and right epididymis are normal. Rectal: Patient with normal sphincter tone. Perineum without scarring or rashes. No rectal masses are appreciated. Prostate is approximately 50 grams, no nodules are appreciated. Seminal vesicles are normal. Skin: No rashes, bruises or suspicious lesions. Lymph: No cervical or inguinal adenopathy. Neurologic: Grossly intact, no focal deficits, moving all 4 extremities. Psychiatric: Normal mood and affect.  Laboratory Data: Results for orders placed or  performed in visit on 06/14/15  Microscopic Examination  Result Value Ref Range   WBC, UA 0-5 0 - 5 /hpf   RBC, UA 3-10 (A) 0 - 2 /hpf   Epithelial Cells (non renal) 0-10 0 - 10 /hpf   Bacteria, UA None seen None seen/Few  Urinalysis, Complete  Result Value Ref Range   Specific Gravity, UA <1.005 (L) 1.005 - 1.030   pH, UA 6.0 5.0 - 7.5   Color, UA Yellow Yellow   Appearance Ur Clear Clear   Leukocytes, UA Negative Negative   Protein, UA Negative Negative/Trace   Glucose, UA Negative Negative   Ketones, UA 1+ (A) Negative   RBC, UA 2+ (A) Negative   Bilirubin, UA Negative Negative   Urobilinogen, Ur 0.2 0.2 - 1.0 mg/dL   Nitrite, UA Negative Negative   Microscopic Examination See below:   BLADDER SCAN AMB  NON-IMAGING  Result Value Ref Range   Scan Result 0     Recent Labs    Lab Results  Component Value Date   WBC 8.1 06/07/2015   HGB 14.2 06/07/2015   HCT 41.8 06/07/2015   MCV 87.1 06/07/2015   PLT 210 06/07/2015       Recent Labs    Lab Results  Component Value Date   CREATININE 0.89 06/07/2015       Recent Labs    No results found for: PSA     Recent Labs    No results found for: TESTOSTERONE     Recent Labs    No results found for: HGBA1C    Urinalysis  Labs (Brief)       Component Value Date/Time   COLORURINE STRAW* 06/07/2015 2045   APPEARANCEUR CLEAR* 06/07/2015 2045   LABSPEC 1.002* 06/07/2015 2045   PHURINE 7.0 06/07/2015 2045   GLUCOSEU Negative 06/14/2015 1124   HGBUR 2+* 06/07/2015 2045   BILIRUBINUR Negative 06/14/2015 [place] 06/07/2015 2045   KETONESUR 1+* 06/07/2015 2045   PROTEINUR NEGATIVE 06/07/2015 2045   NITRITE Negative 06/14/2015 1124   NITRITE NEGATIVE 06/07/2015 2045   LEUKOCYTESUR Negative 06/14/2015 1124   LEUKOCYTESUR  NEGATIVE 06/07/2015 2045      Pertinent Imaging: CLINICAL DATA: RIGHT upper quadrant pain, diarrhea, vomiting and nausea for 3 days. History of cholecystectomy.  EXAM: CT ABDOMEN AND PELVIS WITH CONTRAST  TECHNIQUE: Multidetector CT imaging of the abdomen and pelvis was performed using the standard protocol following bolus administration of intravenous contrast.  CONTRAST: 167mL OMNIPAQUE IOHEXOL 300 MG/ML SOLN  COMPARISON: Abdominal radiograph May 07, 2015 and CT abdomen and pelvis February 25, 2015  FINDINGS: LUNG BASES: Included view of the lung bases are clear. Visualized heart and pericardium are unremarkable.  SOLID ORGANS: The liver, spleen, and adrenal glands are unremarkable. Status post cholecystectomy. Mildly atrophic pancreas.  GASTROINTESTINAL TRACT: Small hiatal hernia. The stomach, small and large bowel are normal in course and caliber without inflammatory changes. Normal appendix.  KIDNEYS/ URINARY TRACT: Kidneys are orthotopic, demonstrating symmetric enhancement. No nephrolithiasis, hydronephrosis or solid renal masses. Too small to characterize hypodensities in the kidneys bilaterally. The unopacified ureters are normal in course and caliber. Delayed imaging through the kidneys demonstrates symmetric prompt contrast excretion within the proximal urinary collecting system. Urinary bladder is well distended with mild circumferential wall thickening.  PERITONEUM/RETROPERITONEUM: Aortoiliac vessels are normal in course and caliber. No lymphadenopathy by CT size criteria. Prostate is more. No intraperitoneal free fluid nor free air.  SOFT TISSUE/OSSEOUS STRUCTURES: Non-suspicious. Small LEFT greater than RIGHT fat containing inguinal hernias. Mild broad dextroscoliosis on this nonweightbearing examination.  IMPRESSION: Mild urinary bladder wall thickening, recommend correlation with urinary analysis.  No specific findings to explain  RIGHT upper quadrant pain. Status post cholecystectomy. Normal appendix.   Electronically Signed  By: Elon Alas M.D.  On: 05/30/2015 02:44    Assessment & Plan:    1. Bladder wall thickening- Patient's urine will be sent for culture. He will need to undergo cystoscopy.  2. Microscopic hematuria- Patient has had 3 incidences of microscopic hematuria. His UA 2 weeks ago and 10 days ago had 0-5 RBCs per high-power field. On today's UA, he had 3-10 RBCs per high power field. A urine culture had not yet been performed. Patient denied any gross hematuria. Explained to patient the causes of blood in the urine are as follows: stones, BPH, UTI's, damage to the urinary tract and/or  cancer. Patient will be scheduled for cystoscopy with bilateral retrogrades with possible biopsies and/or ureteral stent placement.    - CULTURE, URINE COMPREHENSIVE - Urinalysis, Complete - BLADDER SCAN AMB NON-IMAGING   No Follow-up on file.  Zara Council, [facility] [address], [place], [place] [address] [phone]

## 2015-06-29 ENCOUNTER — Encounter: Payer: Self-pay | Admitting: *Deleted

## 2015-06-29 ENCOUNTER — Ambulatory Visit: Payer: Medicaid Other | Admitting: Anesthesiology

## 2015-06-29 ENCOUNTER — Encounter
Admission: RE | Disposition: A | Discharge status: Home / Self Care | Payer: Self-pay | Source: Ambulatory Visit | Attending: Urology

## 2015-06-29 ENCOUNTER — Ambulatory Visit
Admission: RE | Admit: 2015-06-29 | Discharge: 2015-06-29 | Disposition: A | Discharge status: Home / Self Care | Payer: Medicaid Other | Source: Ambulatory Visit | Attending: Urology | Admitting: Urology

## 2015-06-29 DIAGNOSIS — E663 Overweight: Secondary | ICD-10-CM | POA: Insufficient documentation

## 2015-06-29 DIAGNOSIS — R3129 Other microscopic hematuria: Secondary | ICD-10-CM

## 2015-06-29 DIAGNOSIS — Z9049 Acquired absence of other specified parts of digestive tract: Secondary | ICD-10-CM | POA: Insufficient documentation

## 2015-06-29 DIAGNOSIS — E780 Pure hypercholesterolemia: Secondary | ICD-10-CM | POA: Insufficient documentation

## 2015-06-29 DIAGNOSIS — Z79899 Other long term (current) drug therapy: Secondary | ICD-10-CM | POA: Insufficient documentation

## 2015-06-29 DIAGNOSIS — N419 Inflammatory disease of prostate, unspecified: Secondary | ICD-10-CM | POA: Insufficient documentation

## 2015-06-29 DIAGNOSIS — R300 Dysuria: Secondary | ICD-10-CM | POA: Insufficient documentation

## 2015-06-29 DIAGNOSIS — R312 Other microscopic hematuria: Secondary | ICD-10-CM | POA: Diagnosis not present

## 2015-06-29 DIAGNOSIS — R197 Diarrhea, unspecified: Secondary | ICD-10-CM | POA: Insufficient documentation

## 2015-06-29 DIAGNOSIS — M109 Gout, unspecified: Secondary | ICD-10-CM | POA: Insufficient documentation

## 2015-06-29 DIAGNOSIS — Z8249 Family history of ischemic heart disease and other diseases of the circulatory system: Secondary | ICD-10-CM | POA: Insufficient documentation

## 2015-06-29 DIAGNOSIS — Z885 Allergy status to narcotic agent status: Secondary | ICD-10-CM | POA: Insufficient documentation

## 2015-06-29 DIAGNOSIS — Z87442 Personal history of urinary calculi: Secondary | ICD-10-CM | POA: Insufficient documentation

## 2015-06-29 DIAGNOSIS — R311 Benign essential microscopic hematuria: Secondary | ICD-10-CM | POA: Diagnosis not present

## 2015-06-29 DIAGNOSIS — F840 Autistic disorder: Secondary | ICD-10-CM | POA: Insufficient documentation

## 2015-06-29 DIAGNOSIS — K219 Gastro-esophageal reflux disease without esophagitis: Secondary | ICD-10-CM | POA: Insufficient documentation

## 2015-06-29 DIAGNOSIS — K5900 Constipation, unspecified: Secondary | ICD-10-CM | POA: Insufficient documentation

## 2015-06-29 HISTORY — PX: CYSTOSCOPY W/ RETROGRADES: SHX1426

## 2015-06-29 LAB — BASIC METABOLIC PANEL
ANION GAP: 6 (ref 5–15)
BUN: 11 mg/dL (ref 6–20)
CHLORIDE: 104 mmol/L (ref 101–111)
CO2: 29 mmol/L (ref 22–32)
Calcium: 8.9 mg/dL (ref 8.9–10.3)
Creatinine, Ser: 0.9 mg/dL (ref 0.61–1.24)
GFR calc Af Amer: >60 mL/min (ref >60)
GFR calc non Af Amer: >60 mL/min (ref >60)
Glucose, Bld: 120 mg/dL — ABNORMAL HIGH (ref 65–99)
Potassium: 3.9 mmol/L (ref 3.5–5.1)
SODIUM: 139 mmol/L (ref 135–145)

## 2015-06-29 LAB — CBC
HCT: 40.5 % (ref 40.0–52.0)
HEMOGLOBIN: 13.7 g/dL (ref 13.0–18.0)
MCH: 29.6 pg (ref 26.0–34.0)
MCHC: 33.9 g/dL (ref 32.0–36.0)
MCV: 87.5 fL (ref 80.0–100.0)
Platelets: 197 10*3/uL (ref 150–440)
RBC: 4.63 MIL/uL (ref 4.40–5.90)
RDW: 13.1 % (ref 11.5–14.5)
WBC: 5.9 10*3/uL (ref 3.8–10.6)

## 2015-06-29 SURGERY — CYSTOSCOPY, WITH RETROGRADE PYELOGRAM
Anesthesia: General

## 2015-06-29 MED ORDER — FAMOTIDINE 20 MG PO TABS
20.0000 mg | ORAL_TABLET | Freq: Once | ORAL | Status: AC
  Administered 2015-06-29: 20 mg via ORAL

## 2015-06-29 MED ORDER — MIDAZOLAM HCL 5 MG/ML IJ SOLN
1.0000 mg | Freq: Once | INTRAMUSCULAR | Status: AC
  Administered 2015-06-29: 1 mg via INTRAVENOUS

## 2015-06-29 MED ORDER — CEFAZOLIN SODIUM 1-5 GM-% IV SOLN
1.0000 g | INTRAVENOUS | Status: AC
  Administered 2015-06-29: 1 g via INTRAVENOUS

## 2015-06-29 MED ORDER — CEFAZOLIN SODIUM 1-5 GM-% IV SOLN
INTRAVENOUS | Status: AC
  Administered 2015-06-29: 1 g via INTRAVENOUS
  Filled 2015-06-29: qty 50

## 2015-06-29 MED ORDER — FAMOTIDINE 20 MG PO TABS: 20.0000 mg | ORAL_TABLET | Freq: Once | ORAL | Status: DC

## 2015-06-29 MED ORDER — LACTATED RINGERS IV SOLN: INTRAVENOUS | Status: DC

## 2015-06-29 MED ORDER — IOTHALAMATE MEGLUMINE 43 % IV SOLN
INTRAVENOUS | Status: DC | PRN
  Administered 2015-06-29: 20 mL

## 2015-06-29 MED ORDER — MIDAZOLAM HCL 2 MG/2ML IJ SOLN
INTRAMUSCULAR | Status: AC
  Filled 2015-06-29: qty 2

## 2015-06-29 MED ORDER — LACTATED RINGERS IV SOLN
INTRAVENOUS | Status: DC
  Administered 2015-06-29 (×2): via INTRAVENOUS

## 2015-06-29 MED ORDER — FENTANYL CITRATE (PF) 100 MCG/2ML IJ SOLN
INTRAMUSCULAR | Status: DC | PRN
  Administered 2015-06-29: 50 ug via INTRAVENOUS

## 2015-06-29 MED ORDER — EPHEDRINE SULFATE 50 MG/ML IJ SOLN
INTRAMUSCULAR | Status: DC | PRN
  Administered 2015-06-29: 10 mg via INTRAVENOUS

## 2015-06-29 MED ORDER — PROPOFOL 10 MG/ML IV BOLUS
INTRAVENOUS | Status: DC | PRN
  Administered 2015-06-29: 150 mg via INTRAVENOUS

## 2015-06-29 MED ORDER — FAMOTIDINE 20 MG PO TABS
ORAL_TABLET | ORAL | Status: AC
  Administered 2015-06-29: 20 mg via ORAL
  Filled 2015-06-29: qty 1

## 2015-06-29 MED ORDER — ONDANSETRON HCL 4 MG/2ML IJ SOLN: 4.0000 mg | Freq: Once | INTRAMUSCULAR | Status: DC | PRN

## 2015-06-29 MED ORDER — HYDROMORPHONE HCL 1 MG/ML IJ SOLN: 0.2500 mg | INTRAMUSCULAR | Status: DC | PRN

## 2015-06-29 MED ORDER — MIDAZOLAM HCL 2 MG/2ML IJ SOLN
INTRAMUSCULAR | Status: DC | PRN
  Administered 2015-06-29: 1 mg via INTRAVENOUS

## 2015-06-29 MED ORDER — LIDOCAINE HCL (CARDIAC) 20 MG/ML IV SOLN
INTRAVENOUS | Status: DC | PRN
  Administered 2015-06-29: 30 mg via INTRAVENOUS

## 2015-06-29 SURGICAL SUPPLY — 26 items
BAG DRAIN CYSTO-URO LG1000N (MISCELLANEOUS) ×4 IMPLANT
CATH URETL 5X70 OPEN END (CATHETERS) ×4 IMPLANT
CONRAY 43 FOR UROLOGY 50M (MISCELLANEOUS) ×4 IMPLANT
CORD URO TURP 10FT (MISCELLANEOUS) ×4 IMPLANT
GLOVE BIO SURGEON STRL SZ 6.5 (GLOVE) ×3 IMPLANT
GLOVE BIO SURGEON STRL SZ7 (GLOVE) ×8 IMPLANT
GLOVE BIO SURGEONS STRL SZ 6.5 (GLOVE) ×1
GOWN STRL REUS W/ TWL LRG LVL3 (GOWN DISPOSABLE) ×4 IMPLANT
GOWN STRL REUS W/TWL LRG LVL3 (GOWN DISPOSABLE) ×4
JELLY LUB 2OZ STRL (MISCELLANEOUS) ×2
JELLY LUBE 2OZ STRL (MISCELLANEOUS) ×2 IMPLANT
KIT RM TURNOVER CYSTO AR (KITS) ×4 IMPLANT
PACK CYSTO AR (MISCELLANEOUS) ×4 IMPLANT
PAD GROUND ADULT SPLIT (MISCELLANEOUS) IMPLANT
PREP PVP WINGED SPONGE (MISCELLANEOUS) ×4 IMPLANT
PUMP SINGLE ACTION SAP (PUMP) ×4 IMPLANT
SENSORWIRE 0.038 NOT ANGLED (WIRE) ×8
SET CYSTO W/LG BORE CLAMP LF (SET/KITS/TRAYS/PACK) ×4 IMPLANT
SOL .9 NS 3000ML IRR  AL (IV SOLUTION) ×2
SOL .9 NS 3000ML IRR UROMATIC (IV SOLUTION) ×2 IMPLANT
STENT URET 6FRX24 CONTOUR (STENTS) IMPLANT
STENT URET 6FRX26 CONTOUR (STENTS) IMPLANT
SYRINGE IRR TOOMEY STRL 70CC (SYRINGE) ×4 IMPLANT
WATER STERILE IRR 1000ML POUR (IV SOLUTION) ×4 IMPLANT
WATER STERILE IRR 3000ML UROMA (IV SOLUTION) ×4 IMPLANT
WIRE SENSOR 0.038 NOT ANGLED (WIRE) ×4 IMPLANT

## 2015-06-29 NOTE — Discharge Instructions (Addendum)
Cystoscopy, Care After Refer to this sheet in the next few weeks. These instructions provide you with information on caring for yourself after your procedure. Your caregiver may also give you more specific instructions. Your treatment has been planned according to current medical practices, but problems sometimes occur. Call your caregiver if you have any problems or questions after your procedure. HOME CARE INSTRUCTIONS  Things you can do to ease any discomfort after your procedure include:  Drinking enough water and fluids to keep your urine clear or pale yellow.  Taking a warm bath to relieve any burning feelings. SEEK IMMEDIATE MEDICAL CARE IF:   You have an increase in blood in your urine.  You notice blood clots in your urine.  You have difficulty passing urine.  You have the chills.  You have abdominal pain.  You have a fever or persistent symptoms for more than 2-3 days.  You have a fever and your symptoms suddenly get worse. MAKE SURE YOU:   Understand these instructions.  Will watch your condition.  Will get help right away if you are not doing well or get worse. Document Released: 07/06/2005 Document Revised: 08/19/2013 Document Reviewed: 06/09/2012 [facility] Patient Information 2015 [place], [place]. This information is not intended to replace advice given to you by your health care provider. Make sure you discuss any questions you have with your health care provider. AMBULATORY SURGERY  DISCHARGE INSTRUCTIONS   1) The drugs that you were given will stay in your system until tomorrow so for the next 24 hours you should not:  A) Drive an automobile B) Make any legal decisions C) Drink any alcoholic beverage   2) You may resume regular meals tomorrow.  Today it is better to start with liquids and gradually work up to solid foods.  You may eat anything you prefer, but it is better to start with liquids, then soup and crackers, and gradually work up to solid  foods.   3) Please notify your doctor immediately if you have any unusual bleeding, trouble breathing, redness and pain at the surgery site, drainage, fever, or pain not relieved by medication. 4)   5) Your post-operative visit with Dr.    George Ina                                 is: Date:                        Time:    Please call to schedule your post-operative visit.  6) Additional Instructions: 7)

## 2015-06-29 NOTE — Transfer of Care (Signed)
Immediate Anesthesia Transfer of Care Note  Patient: Jesus Guerrero  Procedure(s) Performed: Procedure(s): CYSTOSCOPY WITH RETROGRADE PYELOGRAM (Bilateral)  Patient Location:    Anesthesia Type:General  Level of Consciousness: sedated  Airway & Oxygen Therapy: Patient Spontanous Breathing and Patient connected to face mask oxygen  Post-op Assessment: Report given to RN  Post vital signs: Reviewed  Last Vitals:  Filed Vitals:   06/29/15 1109  BP: 121/65  Pulse: 72  Temp: 36.8 C  Resp: 13    Complications: No apparent anesthesia complications

## 2015-06-29 NOTE — Anesthesia Preprocedure Evaluation (Signed)
Anesthesia Evaluation  Patient identified by MRN, date of birth, ID band Patient awake    Reviewed: Allergy & Precautions, NPO status , Patient's Chart, lab work & pertinent test results  Airway Mallampati: I  TM Distance: >3 FB Neck ROM: Full    Dental  (+) Teeth Intact   Pulmonary    Pulmonary exam normal       Cardiovascular Exercise Tolerance: Good Normal cardiovascular exam    Neuro/Psych    GI/Hepatic Controlled,Reflux has resolved post cholecystectomy.   Endo/Other    Renal/GU      Musculoskeletal   Abdominal (+)  Abdomen: soft.    Peds  Hematology   Anesthesia Other Findings   Reproductive/Obstetrics                             Anesthesia Physical Anesthesia Plan  ASA: II  Anesthesia Plan: General   Post-op Pain Management:    Induction: Intravenous  Airway Management Planned: LMA  Additional Equipment:   Intra-op Plan:   Post-operative Plan: Extubation in OR  Informed Consent: I have reviewed the patients History and Physical, chart, labs and discussed the procedure including the risks, benefits and alternatives for the proposed anesthesia with the patient or authorized representative who has indicated his/her understanding and acceptance.     Plan Discussed with: CRNA  Anesthesia Plan Comments:         Anesthesia Quick Evaluation

## 2015-06-29 NOTE — Progress Notes (Signed)
Ok to send to OR without CBC, MET B results back per Dr Erlene Quan verbal

## 2015-06-29 NOTE — Op Note (Signed)
Date of procedure: 06/29/2015  Preoperative diagnosis:  1. Microscopic hematuria   Postoperative diagnosis:  1. Microscopic hematuria   Procedure: 1. Cystoscopy 2. Bilateral retrograde pyelogram  Surgeon: Hollice Espy, MD  Anesthesia: General  Complications: None  Intraoperative findings: Normal-appearing bladder mucosa. Normal prostate. Normal bilateral retrograde pyelogram.  EBL: None  Specimens: None  Drains: None  Indication: Jesus Guerrero is a 44 y.o. patient with microscopic hematuria who presents today for completion of his workup..  After reviewing the management options for treatment, he elected to proceed with the above surgical procedure(s). We have discussed the potential benefits and risks of the procedure, side effects of the proposed treatment, the likelihood of the patient achieving the goals of the procedure, and any potential problems that might occur during the procedure or recuperation. Informed consent has been obtained.  Description of procedure:  The patient was taken to the operating room and general anesthesia was induced.  The patient was placed in the dorsal lithotomy position, prepped and draped in the usual sterile fashion, and preoperative antibiotics were administered. A preoperative time-out was performed.   40 French rigid cystoscope was advanced per urethra into the bladder. Of note his prostate was relatively normal, short prostatic length without coaptation. Bladder mucosa was then carefully inspected which was normal appearing without any lesions, tumors, or ulcerations. No bladder stones. No trabeculation. The trigone was normal with efflux from both UOs.  Each UO was then cannulated using a 5 [place] open-ended ureteral catheter and contrast was injected to perform a retrograde pyelogram. This revealed normal bilateral ureters with delicate nondilated upper tract collecting system without filling defects. The bladder was then drained and the  scope was removed. Patient was repositioned in the supine position, reversed from anesthesia, and taken to the PACU in stable condition.   Plan: Patient is status post negative hematuria workup.  He may follow-up on an as-needed basis.  Hollice Espy, M.D.

## 2015-06-29 NOTE — Anesthesia Postprocedure Evaluation (Signed)
  Anesthesia Post-op Note  Patient: Jesus Guerrero  Procedure(s) Performed: Procedure(s): CYSTOSCOPY WITH RETROGRADE PYELOGRAM (Bilateral)  Anesthesia type:General  Patient location: PACU  Post pain: Pain level controlled  Post assessment: Post-op Vital signs reviewed, Patient's Cardiovascular Status Stable, Respiratory Function Stable, Patent Airway and No signs of Nausea or vomiting  Post vital signs: Reviewed and stable  Last Vitals:  Filed Vitals:   06/29/15 1109  BP: 121/65  Pulse: 72  Temp: 36.8 C  Resp: 13    Level of consciousness: awake, alert  and patient cooperative  Complications: No apparent anesthesia complications

## 2015-06-29 NOTE — Interval H&P Note (Signed)
History and Physical Interval Note:  06/29/2015 10:06 AM  Jesus Guerrero  has presented today for surgery, with the diagnosis of microscopic hematuria  The various methods of treatment have been discussed with the patient and family. After consideration of risks, benefits and other options for treatment, the patient has consented to  Procedure(s): CYSTOSCOPY WITH RETROGRADE PYELOGRAM (Bilateral) CYSTOSCOPY WITH STENT PLACEMENT (N/A) CYSTOSCOPY WITH BIOPSY (N/A) as a surgical intervention .  The patient's history has been reviewed, patient examined, no change in status, stable for surgery.  I have reviewed the patient's chart and labs.  Questions were answered to the patient's satisfaction.    Likely proceed with  Cysto, bilateral retrograde pyelogram only to complete hematuria work up.    Hollice Espy

## 2015-06-29 NOTE — Anesthesia Procedure Notes (Signed)
Procedure Name: LMA Insertion Date/Time: 06/29/2015 10:29 AM Performed by: Courtney Paris Pre-anesthesia Checklist: Patient identified, Emergency Drugs available and Suction available Patient Re-evaluated:Patient Re-evaluated prior to inductionOxygen Delivery Method: Circle system utilized Preoxygenation: Pre-oxygenation with 100% oxygen Intubation Type: Combination inhalational/ intravenous induction Ventilation: Mask ventilation without difficulty LMA: LMA inserted LMA Size: 4.0 Grade View: Grade II Number of attempts: 1 Tube secured with: Tape Dental Injury: Teeth and Oropharynx as per pre-operative assessment

## 2015-06-29 NOTE — Brief Op Note (Signed)
06/29/2015  11:08 AM  PATIENT:  Jesus Guerrero  44 y.o. male  PRE-OPERATIVE DIAGNOSIS:  MICROSCOPIC HEMATURIA  POST-OPERATIVE DIAGNOSIS:  MICROSCOPIC HEMATURIA  PROCEDURE:  Procedure(s): CYSTOSCOPY WITH RETROGRADE PYELOGRAM (Bilateral)  SURGEON:  Surgeon(s) and Role:    * Hollice Espy, MD - Primary  ASSISTANTS: none   ANESTHESIA:   general  EBL:  Total I/O In: 300 [I.V.:300] Out: -   Drains: none  Specimen: none  COUNTS CORRECT: YES  PLAN OF CARE: Discharge to home after PACU  PATIENT DISPOSITION:  PACU - hemodynamically stable.

## 2015-07-06 ENCOUNTER — Telehealth: Payer: Self-pay | Admitting: Family Medicine

## 2015-07-06 NOTE — Telephone Encounter (Signed)
Pt's pastor called stated he needs a referral for pt to be seen @ [place]. [address] or [phone] (fax). Pt needs orthotic inserts. Thanks.  Delorise Jackson will be leaving on a mission trip Friday

## 2015-07-06 NOTE — Telephone Encounter (Signed)
Forwarded to Dr. Johnson.

## 2015-07-07 NOTE — Telephone Encounter (Signed)
Notified and appointment scheduled for July 18,2016.

## 2015-07-07 NOTE — Telephone Encounter (Signed)
Haven't talked to patient previously about foot pain. Will need face to face per insurance.

## 2015-07-18 ENCOUNTER — Encounter: Payer: Self-pay | Admitting: Family Medicine

## 2015-07-18 ENCOUNTER — Ambulatory Visit (INDEPENDENT_AMBULATORY_CARE_PROVIDER_SITE_OTHER): Payer: Medicaid Other | Admitting: Family Medicine

## 2015-07-18 VITALS — BP 104/69 | HR 89 | Temp 97.9°F | Wt 155.4 lb

## 2015-07-18 DIAGNOSIS — M79671 Pain in right foot: Secondary | ICD-10-CM | POA: Diagnosis not present

## 2015-07-18 NOTE — Progress Notes (Signed)
BP 104/69 mmHg  Pulse 89  Temp(Src) 97.9 F (36.6 C)  Wt 155 lb 6.4 oz (70.489 kg)  SpO2 97%   Subjective:    Patient ID: Jesus Guerrero, male    DOB: 02-28-1971, 44 y.o.   MRN: [id number]  HPI: KAIO KUHLMAN is a 44 y.o. male  Chief Complaint  Patient presents with  . Foot Pain    right foot  for a couple of months, patient states that it is hard to walk on it   FOOT PAIN Duration: 2-3 weeks Involved foot: right Mechanism of injury: unknown Location: top of his foot Onset: sudden  Severity: 7/10  Quality:  cramping Frequency: constant Radiation: no Aggravating factors: weight bearing, walking and stairs  Alleviating factors: NSAIDs  Status: better Treatments attempted: rest and ibuprofen  Relief with NSAIDs?:  mild Weakness with weight bearing or walking: yes Morning stiffness: yes Swelling: no Redness: no Bruising: no Paresthesias / decreased sensation: no  Fevers:no  Relevant past medical, surgical, family and social history reviewed and updated as indicated. Interim medical history since our last visit reviewed. Allergies and medications reviewed and updated.  Review of Systems  Constitutional: Negative.   Respiratory: Negative.   Cardiovascular: Negative.   Musculoskeletal: Negative.   Psychiatric/Behavioral: Negative.     Per HPI unless specifically indicated above     Objective:    BP 104/69 mmHg  Pulse 89  Temp(Src) 97.9 F (36.6 C)  Wt 155 lb 6.4 oz (70.489 kg)  SpO2 97%  Wt Readings from Last 3 Encounters:  07/18/15 155 lb 6.4 oz (70.489 kg)  06/29/15 160 lb (72.576 kg)  06/14/15 156 lb 9.6 oz (71.033 kg)    Physical Exam  Constitutional: He is oriented to person, place, and time. He appears well-developed and well-nourished. No distress.  HENT:  Head: Normocephalic and atraumatic.  Right Ear: Hearing normal.  Left Ear: Hearing normal.  Nose: Nose normal.  Eyes: Conjunctivae and lids are normal. Right eye exhibits no  discharge. Left eye exhibits no discharge. No scleral icterus.  Pulmonary/Chest: Effort normal. No respiratory distress.  Musculoskeletal: Normal range of motion. He exhibits tenderness. He exhibits no edema.       Right foot: There is tenderness and bony tenderness. There is normal range of motion, no swelling, normal capillary refill, no crepitus, no deformity and no laceration.       Left foot: Normal.       Feet:  Neurological: He is alert and oriented to person, place, and time.  Skin: Skin is intact. No rash noted.  Psychiatric: He has a normal mood and affect. His speech is normal and behavior is normal. Judgment and thought content normal. Cognition and memory are normal.    Results for orders placed or performed during the hospital encounter of 06/29/15  CBC  Result Value Ref Range   WBC 5.9 3.8 - 10.6 K/uL   RBC 4.63 4.40 - 5.90 MIL/uL   Hemoglobin 13.7 13.0 - 18.0 g/dL   HCT 40.5 40.0 - 52.0 %   MCV 87.5 80.0 - 100.0 fL   MCH 29.6 26.0 - 34.0 pg   MCHC 33.9 32.0 - 36.0 g/dL   RDW 13.1 11.5 - 14.5 %   Platelets 197 150 - 440 K/uL  Basic metabolic panel  Result Value Ref Range   Sodium 139 135 - 145 mmol/L   Potassium 3.9 3.5 - 5.1 mmol/L   Chloride 104 101 - 111 mmol/L   CO2 29 22 -  32 mmol/L   Glucose, Bld 120 (H) 65 - 99 mg/dL   BUN 11 6 - 20 mg/dL   Creatinine, Ser 0.90 0.61 - 1.24 mg/dL   Calcium 8.9 8.9 - 10.3 mg/dL   GFR calc non Af Amer >60 >60 mL/min   GFR calc Af Amer >60 >60 mL/min   Anion gap 6 5 - 15      Assessment & Plan:   Problem List Items Addressed This Visit    None    Visit Diagnoses    Right foot pain    -  Primary    Requests referral to podiatry- made today. Does not appear to be plantar fasciitis, but information regarding rolling foot given today. Advil PRN.     Relevant Orders    Ambulatory referral to Podiatry        Follow up plan: Return As scheduled.

## 2015-07-18 NOTE — Patient Instructions (Signed)
Plantar Fasciitis  Plantar fasciitis is a common condition that causes foot pain. It is soreness (inflammation) of the band of tough fibrous tissue on the bottom of the foot that runs from the heel bone (calcaneus) to the ball of the foot. The cause of this soreness may be from excessive standing, poor fitting shoes, running on hard surfaces, being overweight, having an abnormal walk, or overuse (this is common in runners) of the painful foot or feet. It is also common in aerobic exercise dancers and ballet dancers.  SYMPTOMS   Most people with plantar fasciitis complain of:   Severe pain in the morning on the bottom of their foot especially when taking the first steps out of bed. This pain recedes after a few minutes of walking.   Severe pain is experienced also during walking following a long period of inactivity.   Pain is worse when walking barefoot or up stairs  DIAGNOSIS    Your caregiver will diagnose this condition by examining and feeling your foot.   Special tests such as X-rays of your foot, are usually not needed.  PREVENTION    Consult a sports medicine professional before beginning a new exercise program.   Walking programs offer a good workout. With walking there is a lower chance of overuse injuries common to runners. There is less impact and less jarring of the joints.   Begin all new exercise programs slowly. If problems or pain develop, decrease the amount of time or distance until you are at a comfortable level.   Wear good shoes and replace them regularly.   Stretch your foot and the heel cords at the back of the ankle (Achilles tendon) both before and after exercise.   Run or exercise on even surfaces that are not hard. For example, asphalt is better than pavement.   Do not run barefoot on hard surfaces.   If using a treadmill, vary the incline.   Do not continue to workout if you have foot or joint problems. Seek professional help if they do not improve.  HOME CARE INSTRUCTIONS     Avoid activities that cause you pain until you recover.   Use ice or cold packs on the problem or painful areas after working out.   Only take over-the-counter or prescription medicines for pain, discomfort, or fever as directed by your caregiver.   Soft shoe inserts or athletic shoes with air or gel sole cushions may be helpful.   If problems continue or become more severe, consult a sports medicine caregiver or your own health care provider. Cortisone is a potent anti-inflammatory medication that may be injected into the painful area. You can discuss this treatment with your caregiver.  MAKE SURE YOU:    Understand these instructions.   Will watch your condition.   Will get help right away if you are not doing well or get worse.  Document Released: 09/11/2001 Document Revised: 03/10/2012 Document Reviewed: 11/10/2008  ExitCare Patient Information 2015 [employer]. This information is not intended to replace advice given to you by your health care provider. Make sure you discuss any questions you have with your health care provider.

## 2015-08-08 ENCOUNTER — Encounter: Payer: Self-pay | Admitting: Podiatry

## 2015-08-08 ENCOUNTER — Ambulatory Visit (INDEPENDENT_AMBULATORY_CARE_PROVIDER_SITE_OTHER): Payer: Medicaid Other | Admitting: Podiatry

## 2015-08-08 ENCOUNTER — Ambulatory Visit (INDEPENDENT_AMBULATORY_CARE_PROVIDER_SITE_OTHER): Payer: Medicaid Other

## 2015-08-08 VITALS — BP 103/66 | HR 85 | Resp 16

## 2015-08-08 DIAGNOSIS — M722 Plantar fascial fibromatosis: Secondary | ICD-10-CM

## 2015-08-08 DIAGNOSIS — M79673 Pain in unspecified foot: Secondary | ICD-10-CM

## 2015-08-08 MED ORDER — METHYLPREDNISOLONE 4 MG PO TBPK: ORAL_TABLET | ORAL | Status: DC

## 2015-08-08 MED ORDER — MELOXICAM 15 MG PO TABS: 15.0000 mg | ORAL_TABLET | Freq: Every day | ORAL | Status: DC

## 2015-08-08 NOTE — Patient Instructions (Signed)

## 2015-08-08 NOTE — Progress Notes (Signed)
   Subjective:    Patient ID: Jesus Guerrero, male    DOB: 23-Aug-1971, 44 y.o.   MRN: [id number]  HPI Comments: "I have pain in my feet"  Patient c/o aching plantar heel and lateral foot bilateral, right over left, for a few months. AM pain. No home treatment.      Review of Systems  HENT: Positive for sinus pressure and sneezing.   Eyes: Positive for itching.  All other systems reviewed and are negative.      Objective:   Physical Exam: I have reviewed his past medical history medications allergies surgery social history and review of systems as well as his chief complaint statement. Pulses are strongly palpable bilateral. Neurologic sensorium is intact per Semmes-Weinstein monofilament. Deep tendon reflexes are brisk and equal bilateral. Muscle strength was 5 over 5 dorsiflexion plantar flexors and inverters everters all digits musculature is intact. Orthopedic evaluation to restrict all joints distal to the ankle for range of motion without crepitation. Cutaneous evaluation demonstrates supple well-hydrated cutis. Radiographs demonstrates soft tissue increase in density at the plantar fascial calcaneal insertion site bilateral he has pain on palpation medial calcaneal tubercles bilateral.        Assessment & Plan:  Assessment: Plantar fasciitis bilateral.  Plan: Discussed etiology pathology conservative versus surgical therapies. Injected bilateral heels today. Start him on a Medrol Dosepak. Discussed appropriate shoe gear stretching exercises and ice therapy.

## 2015-08-09 ENCOUNTER — Telehealth: Payer: Self-pay | Admitting: *Deleted

## 2015-08-09 NOTE — Telephone Encounter (Signed)
Pt stated he was unable to take the antibiotic, it was difficult to swallow.  I called the provided phone number no answer, emailed request for current phone number and DOB due to the number of Lachlan Mckim in our system.  I called the [phone] and spoke with pt,  I reviewed pt medications ordered 08/08/2015 and pt is to be taking Medrol dosepack then Mobic, which are very small pills.  I encouraged pt to take the Medrol dosepack 1st until completion with lots of fluids then the Mobic, but not together.  Pt states understanding and will comply.

## 2015-08-10 ENCOUNTER — Telehealth: Payer: Self-pay | Admitting: *Deleted

## 2015-08-10 NOTE — Telephone Encounter (Signed)
Unable to contact pt by e-mail, called [phone] to answer pt's question, "Ami to take all of the predisone first before i take the other".  I left message informing pt he should take all of the prednisone tapering doses as instructed then take the Meloxicam as instructed.

## 2015-09-07 ENCOUNTER — Encounter: Payer: Self-pay | Admitting: Podiatry

## 2015-09-07 ENCOUNTER — Ambulatory Visit (INDEPENDENT_AMBULATORY_CARE_PROVIDER_SITE_OTHER): Payer: Medicaid Other | Admitting: Podiatry

## 2015-09-07 VITALS — BP 122/66 | HR 84 | Resp 16

## 2015-09-07 DIAGNOSIS — M722 Plantar fascial fibromatosis: Secondary | ICD-10-CM

## 2015-09-07 NOTE — Progress Notes (Signed)
He presents today for follow-up of plantar fasciitis. He continues to take his meloxicam on a regular basis. He states that he is doing some better.  Objective: All signs are stable he is alert and oriented 3. Pulses are strongly palpable bilateral. He still has pain on palpation medial calcaneal tubercles bilateral.  Assessment: Pain in limb secondary to plantar fasciitis bilateral.  Plan: Injected bilateral heels today with Kenalog and local and aesthetic. Discussed appropriate shoe gear stretching exercise ice therapy shoe modifications continue use of the anti-inflammatory's and follow-up with me as needed.

## 2015-10-19 ENCOUNTER — Encounter: Payer: Self-pay | Admitting: Podiatry

## 2015-10-19 ENCOUNTER — Ambulatory Visit (INDEPENDENT_AMBULATORY_CARE_PROVIDER_SITE_OTHER): Payer: Medicaid Other | Admitting: Podiatry

## 2015-10-19 VITALS — BP 136/71 | HR 82 | Resp 18

## 2015-10-19 DIAGNOSIS — M2012 Hallux valgus (acquired), left foot: Secondary | ICD-10-CM

## 2015-10-19 DIAGNOSIS — B353 Tinea pedis: Secondary | ICD-10-CM | POA: Diagnosis not present

## 2015-10-19 DIAGNOSIS — M722 Plantar fascial fibromatosis: Secondary | ICD-10-CM

## 2015-10-19 DIAGNOSIS — M7662 Achilles tendinitis, left leg: Secondary | ICD-10-CM | POA: Diagnosis not present

## 2015-10-19 DIAGNOSIS — M7661 Achilles tendinitis, right leg: Secondary | ICD-10-CM | POA: Diagnosis not present

## 2015-10-19 MED ORDER — CLOTRIMAZOLE 1 % EX CREA: 1.0000 "application " | TOPICAL_CREAM | Freq: Two times a day (BID) | CUTANEOUS | Status: DC

## 2015-10-19 NOTE — Progress Notes (Signed)
He presents today for follow-up of his heel pain. He also states that he has some itching between his toes and his bunion deformity on his left foot hurts.  Objective: Vital signs are stable he is alert and oriented 3. He no longer has pain on palpation of the medial calcaneal tubercles bilateral. He does however have pain on palpation to the lateral aspect of the insertion site of the Achilles bilaterally. This is moderately tender on palpation and there is no erythema edema cellulitis drainage or odor. He does demonstrate tinea pedis and interdigital tinea pedis of the bilateral foot. A marked hallux valgus deformity is present left foot.  Assessment: Resolution of plantar fasciitis bilateral. 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 Tinea pedis bilateral. Hallux valgus left foot.  Plan: We discussed the etiology pathology conservative versus surgical therapies. At this point I recommended injections to the point of maximal tenderness at the insertion site of the Achilles to the lateral aspect of the foot bilaterally. I also wrote him a prescription for clotrimazole was all cream for the tinea pedis. We discussed the need for surgical intervention regarding the first metatarsophalangeal joint. I recommended that he bring his pastor with him the He comes so we can discuss this and consider signing a consent form. I will follow-up with him in 1 month.

## 2015-11-16 ENCOUNTER — Ambulatory Visit (INDEPENDENT_AMBULATORY_CARE_PROVIDER_SITE_OTHER): Payer: Medicaid Other | Admitting: Podiatry

## 2015-11-16 ENCOUNTER — Encounter: Payer: Self-pay | Admitting: Podiatry

## 2015-11-16 VITALS — BP 145/78 | HR 72 | Resp 16

## 2015-11-16 DIAGNOSIS — M778 Other enthesopathies, not elsewhere classified: Secondary | ICD-10-CM

## 2015-11-16 DIAGNOSIS — M722 Plantar fascial fibromatosis: Secondary | ICD-10-CM

## 2015-11-16 DIAGNOSIS — M7752 Other enthesopathy of left foot: Secondary | ICD-10-CM | POA: Diagnosis not present

## 2015-11-16 DIAGNOSIS — M779 Enthesopathy, unspecified: Secondary | ICD-10-CM

## 2015-11-16 NOTE — Progress Notes (Signed)
He presents today with chief complaint of pain to his left heel he states this hurts on the outside of the heel and up into his ankle as he points to the sinus tarsi of the left foot. He states he has some itching between his toes he denies applying alcohol as I had suggested last time between his toes.  Objective: Vital signs are stable he is alert and oriented 3. He has pain on palpation medial and lateral calcaneal tubercles of the left heel. He also has pain on palpation and range of motion of the subtalar joint left foot. Mild interdigital tinea pedis.  Assessment: Interdigital tinea pedis toe joint capsulitis associated with plantar fasciitis left.  Plan: Injected the left subtalar joint today. Applied Castellani's pain between his toes and recommended that he continue to apply his alcohol. I will follow-up with him in 1 month at which time he is no improvement and I would recommend physical therapy.

## 2015-12-28 ENCOUNTER — Ambulatory Visit (INDEPENDENT_AMBULATORY_CARE_PROVIDER_SITE_OTHER): Payer: Medicaid Other | Admitting: Podiatry

## 2015-12-28 ENCOUNTER — Encounter: Payer: Self-pay | Admitting: Podiatry

## 2015-12-28 VITALS — BP 109/63 | HR 75 | Resp 16

## 2015-12-28 DIAGNOSIS — M2012 Hallux valgus (acquired), left foot: Secondary | ICD-10-CM | POA: Diagnosis not present

## 2015-12-28 DIAGNOSIS — M778 Other enthesopathies, not elsewhere classified: Secondary | ICD-10-CM

## 2015-12-28 DIAGNOSIS — M722 Plantar fascial fibromatosis: Secondary | ICD-10-CM | POA: Diagnosis not present

## 2015-12-28 DIAGNOSIS — M779 Enthesopathy, unspecified: Secondary | ICD-10-CM

## 2015-12-28 NOTE — Progress Notes (Signed)
He presents today for follow-up of his plantar fasciitis to his left heel. He states that I have good days and bad days and the injection site is doing much better as he refers to the subtalar joint of his left foot. He does relate however he has tenderness about the first metatarsophalangeal joint of the left foot.  Objective: Vital signs are stable he is alert and oriented 3. Mild tenderness on palpation of the medial calcaneal tubercle of the left heel much improved from previous evaluations. He has no pain on palpation of the sinus tarsi or subtalar joint range of motion of the left foot. Hallux abductovalgus deformity is present left foot which does demonstrate mild neuritis on palpation.  Assessment: Chronic intractable plantar fasciitis left foot. Resolving capsulitis subtalar joint capsulitis left foot. Hallux abductovalgus deformity with neuritis left foot.  Plan: Discussed etiology pathology conservative versus surgical therapies. At this point I reinjected his left heel once again. I discussed with him a possible injection to the first metatarsophalangeal joint next visit. Also discussed with him that it may be necessary for [place] to warm up bunion repair. He understands this and is amenable to it will follow up with me in the near future.

## 2016-01-10 ENCOUNTER — Encounter: Payer: Medicaid Other | Admitting: Family Medicine

## 2016-01-13 ENCOUNTER — Other Ambulatory Visit: Payer: Self-pay | Admitting: Family Medicine

## 2016-01-13 ENCOUNTER — Ambulatory Visit (INDEPENDENT_AMBULATORY_CARE_PROVIDER_SITE_OTHER): Payer: Medicaid Other | Admitting: Family Medicine

## 2016-01-13 ENCOUNTER — Encounter: Payer: Self-pay | Admitting: Family Medicine

## 2016-01-13 VITALS — BP 132/76 | HR 81 | Temp 98.0°F | Ht 64.5 in | Wt 186.0 lb

## 2016-01-13 DIAGNOSIS — Z0000 Encounter for general adult medical examination without abnormal findings: Secondary | ICD-10-CM | POA: Diagnosis not present

## 2016-01-13 DIAGNOSIS — Z114 Encounter for screening for human immunodeficiency virus [HIV]: Secondary | ICD-10-CM | POA: Diagnosis not present

## 2016-01-13 LAB — UA/M W/RFLX CULTURE, ROUTINE
BILIRUBIN UA: NEGATIVE
GLUCOSE, UA: NEGATIVE
Ketones, UA: NEGATIVE
Leukocytes, UA: NEGATIVE
NITRITE UA: NEGATIVE
Protein, UA: NEGATIVE
Specific Gravity, UA: 1.015 (ref 1.005–1.030)
UUROB: 0.2 mg/dL (ref 0.2–1.0)
pH, UA: 6.5 (ref 5.0–7.5)

## 2016-01-13 LAB — MICROSCOPIC EXAMINATION
Epithelial Cells (non renal): NONE SEEN /hpf (ref 0–10)
WBC UA: NONE SEEN /HPF (ref 0–?)

## 2016-01-13 NOTE — Progress Notes (Signed)
BP 132/76 mmHg  Pulse 81  Temp(Src) 98 F (36.7 C)  Ht 5' 4.5" (1.638 m)  Wt 186 lb (84.369 kg)  BMI 31.45 kg/m2  SpO2 99%   Subjective:    Patient ID: Jesus Guerrero, male    DOB: 18-Jan-1971, 45 y.o.   MRN: [id number]  HPI: [name] is a 45 y.o. male presenting on 01/13/2016 for comprehensive medical examination. Current medical complaints include: none  [facility]  Interim Problems from his last visit: no  Depression Screen done today and results listed below:  Depression screen [facility] 2/9 01/13/2016  Decreased Interest 0  Down, Depressed, Hopeless 0  PHQ - 2 Score 0    Past Medical History:  Past Medical History  Diagnosis Date  . Autism   . Prostatitis   . Suspicious nevus   . GERD (gastroesophageal reflux disease)   . Overweight   . UTI (lower urinary tract infection)   . Constipation   . Gout   . High cholesterol   . History of kidney stones     Surgical History:  Past Surgical History  Procedure Laterality Date  . Cholecystectomy  2014  . Cystoscopy w/ retrogrades Bilateral 06/29/2015    Procedure: CYSTOSCOPY WITH RETROGRADE PYELOGRAM;  Surgeon: Hollice Espy, MD;  Location: [facility] ORS;  Service: Urology;  Laterality: Bilateral;    Medications:  Current Outpatient Prescriptions on File Prior to Visit  Medication Sig  . clotrimazole (LOTRIMIN) 1 % cream Apply 1 application topically 2 (two) times daily.  Marland Kitchen ibuprofen (ADVIL,MOTRIN) 200 MG tablet Take 200 mg by mouth every 6 (six) hours as needed for moderate pain.  Marland Kitchen loratadine (CLARITIN) 10 MG tablet Take 10 mg by mouth daily.  . psyllium (METAMUCIL SMOOTH TEXTURE) 28 % packet Take 1 packet by mouth 2 (two) times daily.   No current facility-administered medications on file prior to visit.    Allergies:  Allergies  Allergen Reactions  . Codeine Swelling    Social History:  Social History   Social History  . Marital Status: Single    Spouse Name: N/A  .  Number of Children: N/A  . Years of Education: N/A   Occupational History  . Not on file.   Social History Main Topics  . Smoking status: Never Smoker   . Smokeless tobacco: Never Used  . Alcohol Use: No  . Drug Use: No  . Sexual Activity: Not on file   Other Topics Concern  . Not on file   Social History Narrative   History  Smoking status  . Never Smoker   Smokeless tobacco  . Never Used   History  Alcohol Use No    Family History:  Family History  Problem Relation Age of Onset  . Congestive Heart Failure Father   . Kidney failure Father     Past medical history, surgical history, medications, allergies, family history and social history reviewed with patient today and changes made to appropriate areas of the chart.   Review of Systems  Constitutional: Negative.   HENT: Negative.   Eyes: Negative.   Respiratory: Negative.   Cardiovascular: Negative.   Gastrointestinal: Negative.   Genitourinary: Negative.   Musculoskeletal: Negative.   Skin: Negative.   Neurological: Negative.   Endo/Heme/Allergies: Negative.   Psychiatric/Behavioral: Negative.     All other ROS negative except what is listed above and in the HPI.      Objective:    BP 132/76 mmHg  Pulse 81  Temp(Src) 98 F (36.7 C)  Ht 5' 4.5" (1.638 m)  Wt 186 lb (84.369 kg)  BMI 31.45 kg/m2  SpO2 99%  Wt Readings from Last 3 Encounters:  01/13/16 186 lb (84.369 kg)  07/18/15 155 lb 6.4 oz (70.489 kg)  06/29/15 160 lb (72.576 kg)    Physical Exam  Constitutional: He is oriented to person, place, and time. He appears well-developed and well-nourished. No distress.  HENT:  Head: Normocephalic and atraumatic.  Right Ear: Hearing, tympanic membrane, external ear and ear canal normal.  Left Ear: Hearing, tympanic membrane, external ear and ear canal normal.  Nose: Nose normal.  Mouth/Throat: Uvula is midline, oropharynx is clear and moist and mucous membranes are normal. No oropharyngeal  exudate.  Eyes: Conjunctivae, EOM and lids are normal. Pupils are equal, round, and reactive to light. Right eye exhibits no discharge. Left eye exhibits no discharge. No scleral icterus.  Neck: Normal range of motion. No JVD present. No tracheal deviation present. No thyromegaly present.  Cardiovascular: Normal rate, regular rhythm, normal heart sounds and intact distal pulses.  Exam reveals no gallop and no friction rub.   No murmur heard. Pulmonary/Chest: Effort normal and breath sounds normal. No stridor. No respiratory distress. He has no wheezes. He has no rales. He exhibits no tenderness.  Abdominal: Soft. Bowel sounds are normal. He exhibits no distension and no mass. There is no tenderness. There is no rebound and no guarding.  Genitourinary:  Deferred- done at Urology  Musculoskeletal: Normal range of motion. He exhibits no edema or tenderness.  Lymphadenopathy:    He has no cervical adenopathy.  Neurological: He is alert and oriented to person, place, and time. He has normal reflexes. He displays normal reflexes. No cranial nerve deficit. He exhibits normal muscle tone. Coordination normal.  Skin: Skin is warm, dry and intact. No rash noted. No erythema. No pallor.  Psychiatric: He has a normal mood and affect. His speech is normal and behavior is normal. Judgment and thought content normal. Cognition and memory are normal.  Nursing note and vitals reviewed.   Results for orders placed or performed during the hospital encounter of 06/29/15  CBC  Result Value Ref Range   WBC 5.9 3.8 - 10.6 K/uL   RBC 4.63 4.40 - 5.90 MIL/uL   Hemoglobin 13.7 13.0 - 18.0 g/dL   HCT 40.5 40.0 - 52.0 %   MCV 87.5 80.0 - 100.0 fL   MCH 29.6 26.0 - 34.0 pg   MCHC 33.9 32.0 - 36.0 g/dL   RDW 13.1 11.5 - 14.5 %   Platelets 197 150 - 440 K/uL  Basic metabolic panel  Result Value Ref Range   Sodium 139 135 - 145 mmol/L   Potassium 3.9 3.5 - 5.1 mmol/L   Chloride 104 101 - 111 mmol/L   CO2 29 22 -  32 mmol/L   Glucose, Bld 120 (H) 65 - 99 mg/dL   BUN 11 6 - 20 mg/dL   Creatinine, Ser 0.90 0.61 - 1.24 mg/dL   Calcium 8.9 8.9 - 10.3 mg/dL   GFR calc non Af Amer >60 >60 mL/min   GFR calc Af Amer >60 >60 mL/min   Anion gap 6 5 - 15      Assessment & Plan:   Problem List Items Addressed This Visit    None    Visit Diagnoses    Routine general medical examination at a health care facility    -  Primary  Up to date on vaccines. Screening labs checked today. Work on diet and exercise. Continue to monitor.    Screening for HIV (human immunodeficiency virus)        Screening done today. Await results.     Relevant Orders    HIV antibody        Discussed aspirin prophylaxis for myocardial infarction prevention and decision was it was not indicated  LABORATORY TESTING:  Health maintenance labs ordered today as discussed above.   IMMUNIZATIONS:   - Tdap: Tetanus vaccination status reviewed: last tetanus booster within 10 years. - Influenza: Up to date    PATIENT COUNSELING:    Sexuality: Discussed sexually transmitted diseases, partner selection, use of condoms, avoidance of unintended pregnancy  and contraceptive alternatives.   Advised to avoid cigarette smoking.  I discussed with the patient that most people either abstain from alcohol or drink within safe limits (<=14/week and <=4 drinks/occasion for males, <=7/weeks and <= 3 drinks/occasion for females) and that the risk for alcohol disorders and other health effects rises proportionally with the number of drinks per week and how often a drinker exceeds daily limits.  Discussed cessation/primary prevention of drug use and availability of treatment for abuse.   Diet: Encouraged to adjust caloric intake to maintain  or achieve ideal body weight, to reduce intake of dietary saturated fat and total fat, to limit sodium intake by avoiding high sodium foods and not adding table salt, and to maintain adequate dietary potassium  and calcium preferably from fresh fruits, vegetables, and low-fat dairy products.    stressed the importance of regular exercise  Injury prevention: Discussed safety belts, safety helmets, smoke detector, smoking near bedding or upholstery.   Dental health: Discussed importance of regular tooth brushing, flossing, and dental visits.   Follow up plan: NEXT PREVENTATIVE PHYSICAL DUE IN 1 YEAR. Return in about 1 year (around 01/12/2017) for Physical.

## 2016-01-13 NOTE — Patient Instructions (Signed)

## 2016-01-14 LAB — COMPREHENSIVE METABOLIC PANEL
A/G RATIO: 1.5 (ref 1.1–2.5)
ALBUMIN: 4.3 g/dL (ref 3.5–5.5)
ALT: 18 IU/L (ref 0–44)
AST: 16 IU/L (ref 0–40)
Alkaline Phosphatase: 75 IU/L (ref 39–117)
BUN / CREAT RATIO: 22 — AB (ref 9–20)
BUN: 25 mg/dL — ABNORMAL HIGH (ref 6–24)
Bilirubin Total: 0.3 mg/dL (ref 0.0–1.2)
CALCIUM: 9 mg/dL (ref 8.7–10.2)
CO2: 27 mmol/L (ref 18–29)
CREATININE: 1.14 mg/dL (ref 0.76–1.27)
Chloride: 100 mmol/L (ref 96–106)
GFR, EST AFRICAN AMERICAN: 89 mL/min/{1.73_m2} (ref >59)
GFR, EST NON AFRICAN AMERICAN: 77 mL/min/{1.73_m2} (ref >59)
GLOBULIN, TOTAL: 2.9 g/dL (ref 1.5–4.5)
Glucose: 106 mg/dL — ABNORMAL HIGH (ref 65–99)
Potassium: 4.7 mmol/L (ref 3.5–5.2)
SODIUM: 140 mmol/L (ref 134–144)
Total Protein: 7.2 g/dL (ref 6.0–8.5)

## 2016-01-14 LAB — CBC WITH DIFFERENTIAL/PLATELET
BASOS: 0 %
Basophils Absolute: 0 10*3/uL (ref 0.0–0.2)
EOS (ABSOLUTE): 0.1 10*3/uL (ref 0.0–0.4)
EOS: 1 %
HEMATOCRIT: 41.3 % (ref 37.5–51.0)
HEMOGLOBIN: 14 g/dL (ref 12.6–17.7)
IMMATURE GRANULOCYTES: 1 %
Immature Grans (Abs): 0 10*3/uL (ref 0.0–0.1)
LYMPHS ABS: 2.1 10*3/uL (ref 0.7–3.1)
Lymphs: 23 %
MCH: 30.2 pg (ref 26.6–33.0)
MCHC: 33.9 g/dL (ref 31.5–35.7)
MCV: 89 fL (ref 79–97)
MONOCYTES: 7 %
Monocytes Absolute: 0.6 10*3/uL (ref 0.1–0.9)
NEUTROS PCT: 68 %
Neutrophils Absolute: 6 10*3/uL (ref 1.4–7.0)
Platelets: 211 10*3/uL (ref 150–379)
RBC: 4.63 x10E6/uL (ref 4.14–5.80)
RDW: 13.8 % (ref 12.3–15.4)
WBC: 8.8 10*3/uL (ref 3.4–10.8)

## 2016-01-14 LAB — LIPID PANEL W/O CHOL/HDL RATIO
Cholesterol, Total: 254 mg/dL — ABNORMAL HIGH (ref 100–199)
HDL: 53 mg/dL (ref >39)
LDL CALC: 146 mg/dL — AB (ref 0–99)
TRIGLYCERIDES: 274 mg/dL — AB (ref 0–149)
VLDL Cholesterol Cal: 55 mg/dL — ABNORMAL HIGH (ref 5–40)

## 2016-01-14 LAB — HIV ANTIBODY (ROUTINE TESTING W REFLEX): HIV Screen 4th Generation wRfx: NONREACTIVE

## 2016-01-14 LAB — TSH: TSH: 2.44 u[IU]/mL (ref 0.450–4.500)

## 2016-01-16 ENCOUNTER — Telehealth: Payer: Self-pay | Admitting: Family Medicine

## 2016-01-16 DIAGNOSIS — E785 Hyperlipidemia, unspecified: Secondary | ICD-10-CM

## 2016-01-16 NOTE — Telephone Encounter (Signed)
Spoke to Jesus Guerrero, cholesterol quite high, but everything else looked good. Will come in in next 1-3 months for repeat cholesterol in AM when he is fasting to determine if needs to go on meds or not as he can't control his diet at the [facility].

## 2016-01-18 ENCOUNTER — Encounter: Payer: Self-pay | Admitting: Unknown Physician Specialty

## 2016-01-18 ENCOUNTER — Ambulatory Visit (INDEPENDENT_AMBULATORY_CARE_PROVIDER_SITE_OTHER): Payer: Medicaid Other | Admitting: Unknown Physician Specialty

## 2016-01-18 VITALS — BP 126/78 | HR 85 | Temp 98.6°F | Ht 65.6 in | Wt 185.4 lb

## 2016-01-18 DIAGNOSIS — J069 Acute upper respiratory infection, unspecified: Secondary | ICD-10-CM

## 2016-01-18 MED ORDER — BENZONATATE 200 MG PO CAPS: 200.0000 mg | ORAL_CAPSULE | Freq: Two times a day (BID) | ORAL | Status: DC | PRN

## 2016-01-18 NOTE — Patient Instructions (Addendum)
Upper Respiratory Infection, Adult Most upper respiratory infections (URIs) are a viral infection of the air passages leading to the lungs. A URI affects the nose, throat, and upper air passages. The most common type of URI is nasopharyngitis and is typically referred to as "the common cold." URIs run their course and usually go away on their own. Most of the time, a URI does not require medical attention, but sometimes a bacterial infection in the upper airways can follow a viral infection. This is called a secondary infection. Sinus and middle ear infections are common types of secondary upper respiratory infections. Bacterial pneumonia can also complicate a URI. A URI can worsen asthma and chronic obstructive pulmonary disease (COPD). Sometimes, these complications can require emergency medical care and may be life threatening.  CAUSES Almost all URIs are caused by viruses. A virus is a type of germ and can spread from one person to another.  RISKS FACTORS You may be at risk for a URI if:   You smoke.   You have chronic heart or lung disease.  You have a weakened defense (immune) system.   You are very young or very old.   You have nasal allergies or asthma.  You work in crowded or poorly ventilated areas.  You work in health care facilities or schools. SIGNS AND SYMPTOMS  Symptoms typically develop 2-3 days after you come in contact with a cold virus. Most viral URIs last 7-10 days. However, viral URIs from the influenza virus (flu virus) can last 14-18 days and are typically more severe. Symptoms may include:   Runny or stuffy (congested) nose.   Sneezing.   Cough.   Sore throat.   Headache.   Fatigue.   Fever.   Loss of appetite.   Pain in your forehead, behind your eyes, and over your cheekbones (sinus pain).  Muscle aches.  DIAGNOSIS  Your health care provider may diagnose a URI by:  Physical exam.  Tests to check that your symptoms are not due to  another condition such as:  Strep throat.  Sinusitis.  Pneumonia.  Asthma. TREATMENT  A URI goes away on its own with time. It cannot be cured with medicines, but medicines may be prescribed or recommended to relieve symptoms. Medicines may help:  Reduce your fever.  Reduce your cough.  Relieve nasal congestion. HOME CARE INSTRUCTIONS   Take medicines only as directed by your health care provider.   Gargle warm saltwater or take cough drops to comfort your throat as directed by your health care provider.  Use a warm mist humidifier or inhale steam from a shower to increase air moisture. This may make it easier to breathe.  Drink enough fluid to keep your urine clear or pale yellow.   Eat soups and other clear broths and maintain good nutrition.   Rest as needed.   Return to work when your temperature has returned to normal or as your health care provider advises. You may need to stay home longer to avoid infecting others. You can also use a face mask and careful hand washing to prevent spread of the virus.  Increase the usage of your inhaler if you have asthma.   Do not use any tobacco products, including cigarettes, chewing tobacco, or electronic cigarettes. If you need help quitting, ask your health care provider. PREVENTION  The best way to protect yourself from getting a cold is to practice good hygiene.   Avoid oral or hand contact with people with cold   symptoms.   Wash your hands often if contact occurs.  There is no clear evidence that vitamin C, vitamin E, echinacea, or exercise reduces the chance of developing a cold. However, it is always recommended to get plenty of rest, exercise, and practice good nutrition.  SEEK MEDICAL CARE IF:   You are getting worse rather than better.   Your symptoms are not controlled by medicine.   You have chills.  You have worsening shortness of breath.  You have brown or red mucus.  You have yellow or brown nasal  discharge.  You have pain in your face, especially when you bend forward.  You have a fever.  You have swollen neck glands.  You have pain while swallowing.  You have white areas in the back of your throat. SEEK IMMEDIATE MEDICAL CARE IF:   You have severe or persistent:  Headache.  Ear pain.  Sinus pain.  Chest pain.  You have chronic lung disease and any of the following:  Wheezing.  Prolonged cough.  Coughing up blood.  A change in your usual mucus.  You have a stiff neck.  You have changes in your:  Vision.  Hearing.  Thinking.  Mood. MAKE SURE YOU:   Understand these instructions.  Will watch your condition.  Will get help right away if you are not doing well or get worse.   This information is not intended to replace advice given to you by your health care provider. Make sure you discuss any questions you have with your health care provider.   Document Released: 06/12/2001 Document Revised: 05/03/2015 Document Reviewed: 03/24/2014 Elsevier Interactive Patient Education 2016 [place] may use over the counter Mucinex for the nasal congestion.

## 2016-01-18 NOTE — Progress Notes (Signed)
   BP 126/78 mmHg  Pulse 85  Temp(Src) 98.6 F (37 C)  Ht 5' 5.6" (1.666 m)  Wt 185 lb 6.4 oz (84.097 kg)  BMI 30.30 kg/m2  SpO2 99%   Subjective:    Patient ID: Jesus Guerrero, male    DOB: 23-Aug-1971, 45 y.o.   MRN: [id number]  HPI: Jesus Guerrero is a 45 y.o. male  Chief Complaint  Patient presents with  . Cough    pt states he has a cough, congestion and has been throwing up for 3 days    Cough This is a new problem. The current episode started in the past 7 days. The problem has been unchanged. The problem occurs constantly. Associated symptoms include chills, nasal congestion, rhinorrhea and a sore throat. Pertinent negatives include no ear pain. Associated symptoms comments: Vomiting in the AM. Nothing aggravates the symptoms. He has tried nothing for the symptoms.    Relevant past medical, surgical, family and social history reviewed and updated as indicated. Interim medical history since our last visit reviewed. Allergies and medications reviewed and updated.  Review of Systems  Constitutional: Positive for chills.  HENT: Positive for rhinorrhea and sore throat. Negative for ear pain.   Respiratory: Positive for cough.     Per HPI unless specifically indicated above     Objective:    BP 126/78 mmHg  Pulse 85  Temp(Src) 98.6 F (37 C)  Ht 5' 5.6" (1.666 m)  Wt 185 lb 6.4 oz (84.097 kg)  BMI 30.30 kg/m2  SpO2 99%  Wt Readings from Last 3 Encounters:  01/18/16 185 lb 6.4 oz (84.097 kg)  01/13/16 186 lb (84.369 kg)  07/18/15 155 lb 6.4 oz (70.489 kg)    Physical Exam  Constitutional: He is oriented to person, place, and time. He appears well-developed and well-nourished. No distress.  HENT:  Head: Normocephalic and atraumatic.  Right Ear: Tympanic membrane and ear canal normal.  Left Ear: Tympanic membrane and ear canal normal.  Nose: Rhinorrhea present. Right sinus exhibits no maxillary sinus tenderness and no frontal sinus tenderness. Left sinus  exhibits no maxillary sinus tenderness and no frontal sinus tenderness.  Mouth/Throat: Uvula is midline. Posterior oropharyngeal edema present.  Eyes: Conjunctivae and lids are normal. Right eye exhibits no discharge. Left eye exhibits no discharge. No scleral icterus.  Neck: Neck supple.  Cardiovascular: Normal rate, regular rhythm and normal heart sounds.   Pulmonary/Chest: Effort normal and breath sounds normal. No respiratory distress.  Abdominal: Normal appearance. There is no splenomegaly or hepatomegaly.  Musculoskeletal: Normal range of motion.  Neurological: He is alert and oriented to person, place, and time.  Skin: Skin is warm, dry and intact. No rash noted. No pallor.  Psychiatric: He has a normal mood and affect. His behavior is normal. Judgment and thought content normal.  Nursing note and vitals reviewed.     Assessment & Plan:   Problem List Items Addressed This Visit    None    Visit Diagnoses    URI (upper respiratory infection)    -  Primary        Follow up plan: Return if symptoms worsen or fail to improve.

## 2016-01-25 ENCOUNTER — Encounter: Payer: Self-pay | Admitting: Emergency Medicine

## 2016-01-25 ENCOUNTER — Emergency Department
Admission: EM | Admit: 2016-01-25 | Discharge: 2016-01-25 | Disposition: A | Discharge status: Home / Self Care | Payer: Medicaid Other | Attending: Emergency Medicine | Admitting: Emergency Medicine

## 2016-01-25 ENCOUNTER — Emergency Department: Payer: Medicaid Other

## 2016-01-25 DIAGNOSIS — M7981 Nontraumatic hematoma of soft tissue: Secondary | ICD-10-CM | POA: Diagnosis not present

## 2016-01-25 DIAGNOSIS — M25521 Pain in right elbow: Secondary | ICD-10-CM | POA: Insufficient documentation

## 2016-01-25 DIAGNOSIS — Z79899 Other long term (current) drug therapy: Secondary | ICD-10-CM | POA: Diagnosis not present

## 2016-01-25 DIAGNOSIS — M25421 Effusion, right elbow: Secondary | ICD-10-CM

## 2016-01-25 MED ORDER — NAPROXEN 500 MG PO TABS
500.0000 mg | ORAL_TABLET | Freq: Once | ORAL | Status: AC
  Administered 2016-01-25: 500 mg via ORAL
  Filled 2016-01-25: qty 1

## 2016-01-25 MED ORDER — SULFAMETHOXAZOLE-TRIMETHOPRIM 800-160 MG PO TABS
1.0000 | ORAL_TABLET | Freq: Once | ORAL | Status: AC
  Administered 2016-01-25: 1 via ORAL
  Filled 2016-01-25: qty 1

## 2016-01-25 MED ORDER — SULFAMETHOXAZOLE-TRIMETHOPRIM 800-160 MG PO TABS: 1.0000 | ORAL_TABLET | Freq: Two times a day (BID) | ORAL | Status: DC

## 2016-01-25 MED ORDER — NAPROXEN 500 MG PO TABS: 500.0000 mg | ORAL_TABLET | Freq: Two times a day (BID) | ORAL | Status: DC

## 2016-01-25 NOTE — Discharge Instructions (Signed)
Begin Taking Bactrim DS twice a day until finished. Naproxen as needed for inflammation and pain. Use warm compresses to your elbow.  Wear sling for comfort. Follow-up with your doctor or the orthopedist written on your papers in 3-4 days for recheck. Return to the emergency room if any severe worsening of your symptoms or fever over 101.

## 2016-01-25 NOTE — ED Provider Notes (Signed)
[facility] Emergency Department Provider Note  ____________________________________________  Time seen: Approximately 12:56 PM  I have reviewed the triage vital signs and the nursing notes.   HISTORY  Chief Complaint Elbow Pain   HPI Jesus Guerrero is a 45 y.o. male complaining of right elbow pain. Patient states that it started on Monday and gradually worse. He has noticed 2 tiny places on the back of his elbow that he thinks might be a bug bite but he is uncertain. He also has a bruise to the lateral aspect which he was not aware of. He states that the swelling is worse today. He has not been taking anything over-the-counter except for Tylenol for the pain. He denies any previous injury and has not had a fever that he is aware of. Range of motion increases his pain. Currently he rates his pain is 7 out of 10.   Past Medical History  Diagnosis Date  . Autism   . Prostatitis   . Suspicious nevus   . GERD (gastroesophageal reflux disease)   . Overweight   . UTI (lower urinary tract infection)   . Constipation   . Gout   . High cholesterol   . History of kidney stones     Patient Active Problem List   Diagnosis Date Noted  . Hyperlipidemia 01/16/2016  . Bladder wall thickening 06/15/2015  . Microscopic hematuria 06/15/2015  . Abdominal pain 06/09/2015  . Acute anxiety 06/09/2015  . Autism   . Prostatitis   . GERD (gastroesophageal reflux disease)   . Overweight     Past Surgical History  Procedure Laterality Date  . Cholecystectomy  2014  . Cystoscopy w/ retrogrades Bilateral 06/29/2015    Procedure: CYSTOSCOPY WITH RETROGRADE PYELOGRAM;  Surgeon: Hollice Espy, MD;  Location: [facility] ORS;  Service: Urology;  Laterality: Bilateral;    Current Outpatient Rx  Name  Route  Sig  Dispense  Refill  . benzonatate (TESSALON) 200 MG capsule   Oral   Take 1 capsule (200 mg total) by mouth 2 (two) times daily as needed for cough.   20 capsule   0    . loratadine (CLARITIN) 10 MG tablet   Oral   Take 10 mg by mouth daily.         . naproxen (NAPROSYN) 500 MG tablet   Oral   Take 1 tablet (500 mg total) by mouth 2 (two) times daily with a meal.   30 tablet   0   . psyllium (METAMUCIL SMOOTH TEXTURE) 28 % packet   Oral   Take 1 packet by mouth 2 (two) times daily.   30 packet   3   . sulfamethoxazole-trimethoprim (BACTRIM DS,SEPTRA DS) 800-160 MG tablet   Oral   Take 1 tablet by mouth 2 (two) times daily.   20 tablet   0     Allergies Codeine  Family History  Problem Relation Age of Onset  . Congestive Heart Failure Father   . Kidney failure Father     Social History Social History  Substance Use Topics  . Smoking status: Never Smoker   . Smokeless tobacco: Never Used  . Alcohol Use: No    Review of Systems Constitutional: No fever/chills Cardiovascular: Denies chest pain. Respiratory: Denies shortness of breath. Gastrointestinal:   No nausea, no vomiting.   Genitourinary: Negative for dysuria. Musculoskeletal: Negative for back pain. Positive for right elbow pain. Skin: Negative for rash. +2 individual "bites" right elbow. Positive ecchymosis right elbow.  Neurological: Negative for headaches, focal weakness or numbness.  10-point ROS otherwise negative.  ____________________________________________   PHYSICAL EXAM:  VITAL SIGNS: ED Triage Vitals  Enc Vitals Group     BP 01/25/16 1241 126/72 mmHg     Pulse Rate 01/25/16 1241 85     Resp 01/25/16 1241 20     Temp 01/25/16 1241 98.2 F (36.8 C)     Temp Source 01/25/16 1241 Oral     SpO2 01/25/16 1241 100 %     Weight 01/25/16 1241 185 lb (83.915 kg)     Height 01/25/16 1241 5\' 4"  (1.626 m)     Head Cir --      Peak Flow --      Pain Score 01/25/16 1242 7     Pain Loc --      Pain Edu? --      Excl. in [place]? --     Constitutional: Alert and oriented. Well appearing and in no acute distress. Eyes: Conjunctivae are normal. PERRL.  EOMI. Head: Atraumatic. Nose: No congestion/rhinnorhea. Neck: No stridor.   Cardiovascular: Normal rate, regular rhythm. Grossly normal heart sounds.  Good peripheral circulation. Respiratory: Normal respiratory effort.  No retractions. Lungs CTAB. Gastrointestinal: Soft and nontender. No distention.  Musculoskeletal: No lower extremity tenderness or edema is noted. There is moderate tenderness with soft tissue edema present right elbow both lateral and posterior. There is no obvious erythema or warmth present. There are 2 individual questionable insect bites. Range of motion is slightly restricted secondary to swelling and pain. No crepitus is noted. Neurologic:  Normal speech and language. No gross focal neurologic deficits are appreciated. No gait instability. Skin:  Skin is warm, dry and intact. No rash noted. Questionable insect bites as discussed above. There is 1 cm ecchymotic area on the lateral aspect of the right elbow. This also is tender to palpation. Psychiatric: Mood and affect are normal. Speech and behavior are normal.  ____________________________________________   LABS (all labs ordered are listed, but only abnormal results are displayed)  Labs Reviewed - No data to display   RADIOLOGY  Right elbow shows moderate to large elbow effusion of uncertain etiology. No bony abnormality was seen. I, Johnn Hai, personally viewed and evaluated these images (plain radiographs) as part of my medical decision making, as well as reviewing the written report by the radiologist. ____________________________________________   PROCEDURES  Procedure(s) performed: None  Critical Care performed: No  ____________________________________________   INITIAL IMPRESSION / ASSESSMENT AND PLAN / ED COURSE  Pertinent labs & imaging results that were available during my care of the patient were reviewed by me and considered in my medical decision making (see chart for details).    Patient was placed in a sling and started on Bactrim DS twice a day for 10 days along with naproxen 500 mg twice a day with food. He was at patient's request that he not get any narcotics due to his living situation. He is to follow-up with Dr. Rudene Christians and encouraged to call the office today for an appointment. He'll return to the emergency room if any severe worsening of his symptoms ____________________________________________   FINAL CLINICAL IMPRESSION(S) / ED DIAGNOSES  Final diagnoses:  Pain and swelling of right elbow      Johnn Hai, PA-C 01/25/16 1452  Eula Listen, MD 01/25/16 1514

## 2016-01-25 NOTE — ED Notes (Addendum)
Pt to ed with c/o right elbow pain.  Pt states it started on Monday am and has continually gotten worse. Noted swelling to right elbow, no redness noted to skin at this time.

## 2016-01-31 ENCOUNTER — Encounter: Payer: Self-pay | Admitting: Family Medicine

## 2016-01-31 ENCOUNTER — Ambulatory Visit (INDEPENDENT_AMBULATORY_CARE_PROVIDER_SITE_OTHER): Payer: Medicaid Other | Admitting: Family Medicine

## 2016-01-31 VITALS — BP 123/71 | HR 79 | Temp 97.0°F | Ht 64.7 in | Wt 180.0 lb

## 2016-01-31 DIAGNOSIS — N4822 Cellulitis of corpus cavernosum and penis: Secondary | ICD-10-CM | POA: Diagnosis not present

## 2016-01-31 MED ORDER — AMOXICILLIN-POT CLAVULANATE 875-125 MG PO TABS: 1.0000 | ORAL_TABLET | Freq: Two times a day (BID) | ORAL | Status: DC

## 2016-01-31 NOTE — Progress Notes (Signed)
BP 123/71 mmHg  Pulse 79  Temp(Src) 97 F (36.1 C)  Ht 5' 4.7" (1.643 m)  Wt 180 lb (81.647 kg)  BMI 30.25 kg/m2  SpO2 100%   Subjective:    Patient ID: Jesus Guerrero, male    DOB: 1971-10-15, 45 y.o.   MRN: [id number]  HPI: SALVADOR MONTEIRO is a 45 y.o. male  Chief Complaint  Patient presents with  . Rash    Patient stopped the Bactrim on Sunday, he has a rash around the groin area. He states that it has not gotten any better since he stopped it.   RASH Duration:  A few days  Location: groin  Itching: no Burning: yes Redness: yes Oozing: yes Scaling: yes Blisters: no Painful: yes Fevers: no Change in detergents/soaps/personal care products: no Recent illness: no Recent travel:no History of same: yes, was a staph infection Context: stable Alleviating factors: nothing Treatments attempted:nothing Shortness of breath: no  Throat/tongue swelling: no Myalgias/arthralgias: no   Relevant past medical, surgical, family and social history reviewed and updated as indicated. Interim medical history since our last visit reviewed. Allergies and medications reviewed and updated.  Review of Systems  Constitutional: Negative.   Respiratory: Negative.   Cardiovascular: Negative.   Gastrointestinal: Negative.   Genitourinary: Positive for penile swelling and penile pain. Negative for dysuria, urgency, frequency, hematuria, flank pain, decreased urine volume, discharge, scrotal swelling, enuresis, difficulty urinating, genital sores and testicular pain.  Psychiatric/Behavioral: Negative.     Per HPI unless specifically indicated above     Objective:    BP 123/71 mmHg  Pulse 79  Temp(Src) 97 F (36.1 C)  Ht 5' 4.7" (1.643 m)  Wt 180 lb (81.647 kg)  BMI 30.25 kg/m2  SpO2 100%  Wt Readings from Last 3 Encounters:  01/31/16 180 lb (81.647 kg)  01/25/16 185 lb (83.915 kg)  01/18/16 185 lb 6.4 oz (84.097 kg)    Physical Exam  Constitutional: He is oriented to  person, place, and time. He appears well-developed and well-nourished. No distress.  HENT:  Head: Normocephalic and atraumatic.  Right Ear: Hearing normal.  Left Ear: Hearing normal.  Nose: Nose normal.  Eyes: Conjunctivae and lids are normal. Right eye exhibits no discharge. Left eye exhibits no discharge. No scleral icterus.  Pulmonary/Chest: Effort normal. No respiratory distress.  Genitourinary: Testes normal.    Cremasteric reflex is present. Right testis shows no mass, no swelling and no tenderness. Right testis is descended. Cremasteric reflex is not absent on the right side. Left testis shows no mass, no swelling and no tenderness. Left testis is descended. Cremasteric reflex is not absent on the left side. Circumcised. Penile erythema and penile tenderness present. No phimosis, paraphimosis or hypospadias. Discharge found.  Musculoskeletal: Normal range of motion.  Neurological: He is alert and oriented to person, place, and time.  Skin: Skin is intact. No rash noted.  Psychiatric: He has a normal mood and affect. His speech is normal and behavior is normal. Judgment and thought content normal. Cognition and memory are normal.  Nursing note and vitals reviewed.   Results for orders placed or performed in visit on 01/13/16  Microscopic Examination  Result Value Ref Range   WBC, UA None seen 0 -  5 /hpf   RBC, UA 0-2 0 -  2 /hpf   Epithelial Cells (non renal) None seen 0 - 10 /hpf  HIV antibody  Result Value Ref Range   HIV Screen 4th Generation wRfx Non Reactive Non Reactive  CBC with Differential/Platelet  Result Value Ref Range   WBC 8.8 3.4 - 10.8 x10E3/uL   RBC 4.63 4.14 - 5.80 x10E6/uL   Hemoglobin 14.0 12.6 - 17.7 g/dL   Hematocrit 41.3 37.5 - 51.0 %   MCV 89 79 - 97 fL   MCH 30.2 26.6 - 33.0 pg   MCHC 33.9 31.5 - 35.7 g/dL   RDW 13.8 12.3 - 15.4 %   Platelets 211 150 - 379 x10E3/uL   Neutrophils 68 %   Lymphs 23 %   Monocytes 7 %   Eos 1 %   Basos 0 %    Neutrophils Absolute 6.0 1.4 - 7.0 x10E3/uL   Lymphocytes Absolute 2.1 0.7 - 3.1 x10E3/uL   Monocytes Absolute 0.6 0.1 - 0.9 x10E3/uL   EOS (ABSOLUTE) 0.1 0.0 - 0.4 x10E3/uL   Basophils Absolute 0.0 0.0 - 0.2 x10E3/uL   Immature Granulocytes 1 %   Immature Grans (Abs) 0.0 0.0 - 0.1 x10E3/uL  Comprehensive metabolic panel  Result Value Ref Range   Glucose 106 (H) 65 - 99 mg/dL   BUN 25 (H) 6 - 24 mg/dL   Creatinine, Ser 1.14 0.76 - 1.27 mg/dL   GFR calc non Af Amer 77 >59 mL/min/1.73   GFR calc Af Amer 89 >59 mL/min/1.73   BUN/Creatinine Ratio 22 (H) 9 - 20   Sodium 140 134 - 144 mmol/L   Potassium 4.7 3.5 - 5.2 mmol/L   Chloride 100 96 - 106 mmol/L   CO2 27 18 - 29 mmol/L   Calcium 9.0 8.7 - 10.2 mg/dL   Total Protein 7.2 6.0 - 8.5 g/dL   Albumin 4.3 3.5 - 5.5 g/dL   Globulin, Total 2.9 1.5 - 4.5 g/dL   Albumin/Globulin Ratio 1.5 1.1 - 2.5   Bilirubin Total 0.3 0.0 - 1.2 mg/dL   Alkaline Phosphatase 75 39 - 117 IU/L   AST 16 0 - 40 IU/L   ALT 18 0 - 44 IU/L  Lipid Panel w/o Chol/HDL Ratio  Result Value Ref Range   Cholesterol, Total 254 (H) 100 - 199 mg/dL   Triglycerides 274 (H) 0 - 149 mg/dL   HDL 53 >39 mg/dL   VLDL Cholesterol Cal 55 (H) 5 - 40 mg/dL   LDL Calculated 146 (H) 0 - 99 mg/dL  TSH  Result Value Ref Range   TSH 2.440 0.450 - 4.500 uIU/mL  UA/M w/rflx Culture, Routine  Result Value Ref Range   Specific Gravity, UA 1.015 1.005 - 1.030   pH, UA 6.5 5.0 - 7.5   Color, UA Yellow Yellow   Appearance Ur Clear Clear   Leukocytes, UA Negative Negative   Protein, UA Negative Negative/Trace   Glucose, UA Negative Negative   Ketones, UA Negative Negative   RBC, UA Trace (A) Negative   Bilirubin, UA Negative Negative   Urobilinogen, Ur 0.2 0.2 - 1.0 mg/dL   Nitrite, UA Negative Negative   Microscopic Examination See below:       Assessment & Plan:   Problem List Items Addressed This Visit    None    Visit Diagnoses    Penile cellulitis    -  Primary     Wil start augmentin. If not better in 3-4 days, needs to see his urologist. Continue to monitor very closely and recheck in 7-10 days.    Relevant Orders    Wound culture        Follow up plan: Return 7-10 days follow up cellulitis.

## 2016-01-31 NOTE — Patient Instructions (Signed)
I'll see you in 7-10 days If it's not starting to look better in 3-4 days, I want you to call your urologist and see if they will see you

## 2016-02-02 ENCOUNTER — Ambulatory Visit: Payer: Medicaid Other | Admitting: Family Medicine

## 2016-02-03 LAB — WOUND CULTURE: ORGANISM ID, BACTERIA: NONE SEEN

## 2016-02-08 ENCOUNTER — Ambulatory Visit (INDEPENDENT_AMBULATORY_CARE_PROVIDER_SITE_OTHER): Payer: Medicaid Other | Admitting: Podiatry

## 2016-02-08 ENCOUNTER — Encounter: Payer: Self-pay | Admitting: Podiatry

## 2016-02-08 VITALS — BP 131/73 | HR 88 | Resp 16

## 2016-02-08 DIAGNOSIS — M7752 Other enthesopathy of left foot: Secondary | ICD-10-CM | POA: Diagnosis not present

## 2016-02-08 DIAGNOSIS — M722 Plantar fascial fibromatosis: Secondary | ICD-10-CM

## 2016-02-08 DIAGNOSIS — M2012 Hallux valgus (acquired), left foot: Secondary | ICD-10-CM

## 2016-02-08 DIAGNOSIS — M779 Enthesopathy, unspecified: Secondary | ICD-10-CM

## 2016-02-08 DIAGNOSIS — M778 Other enthesopathies, not elsewhere classified: Secondary | ICD-10-CM

## 2016-02-08 NOTE — Progress Notes (Signed)
He presents today for follow-up of his plantar fasciitis and painful first metatarsophalangeal joint left. He states that he seems to be doing fairly well with his heels he says that they are approximately 50% improved. He is having problems still at the first metatarsophalangeal joint left foot.  Objective: Vital signs are stable he is alert and oriented 3 pulses are palpable. Much decrease on pain on palpation to bilateral heels. He still has tenderness with mild erythema to the first metatarsophalangeal joint left foot. Hallux limitus is noted.  Assessment: Hallux limitus first metatarsophalangeal joint left foot capsulitis first metatarsophalangeal joint left foot resolving plantar fasciitis.  Plan: I discussed that he should purchase of a new set of new balance tennis shoes. The ones he has now are completely broken down. I provided him free of charge with insoles. I also injected the first metatarsophalangeal joint left foot after sterile Betadine skin prep with Kenalog and local anesthetic. Follow-up with him in a few months

## 2016-02-09 ENCOUNTER — Ambulatory Visit (INDEPENDENT_AMBULATORY_CARE_PROVIDER_SITE_OTHER): Payer: Medicaid Other | Admitting: Family Medicine

## 2016-02-09 ENCOUNTER — Encounter: Payer: Self-pay | Admitting: Family Medicine

## 2016-02-09 VITALS — BP 125/71 | HR 81 | Temp 98.2°F | Ht 65.3 in | Wt 182.0 lb

## 2016-02-09 DIAGNOSIS — N4822 Cellulitis of corpus cavernosum and penis: Secondary | ICD-10-CM | POA: Diagnosis not present

## 2016-02-09 DIAGNOSIS — L309 Dermatitis, unspecified: Secondary | ICD-10-CM | POA: Diagnosis not present

## 2016-02-09 DIAGNOSIS — M25421 Effusion, right elbow: Secondary | ICD-10-CM

## 2016-02-09 MED ORDER — TRIAMCINOLONE ACETONIDE 0.1 % EX CREA: 1.0000 "application " | TOPICAL_CREAM | Freq: Two times a day (BID) | CUTANEOUS | Status: DC

## 2016-02-09 NOTE — Patient Instructions (Addendum)
Lateral Epicondylitis With Rehab Lateral epicondylitis involves inflammation and pain around the outer portion of the elbow. The pain is caused by inflammation of the tendons in the forearm that bring back (extend) the wrist. Lateral epicondylitis is also called tennis elbow, because it is very common in tennis players. However, it may occur in any individual who extends the wrist repetitively. If lateral epicondylitis is left untreated, it may become a chronic problem. SYMPTOMS   Pain, tenderness, and inflammation on the outer (lateral) side of the elbow.  Pain or weakness with gripping activities.  Pain that increases with wrist-twisting motions (playing tennis, using a screwdriver, opening a door or a jar).  Pain with lifting objects, including a coffee cup. CAUSES  Lateral epicondylitis is caused by inflammation of the tendons that extend the wrist. Causes of injury may include:  Repetitive stress and strain on the muscles and tendons that extend the wrist.  Sudden change in activity level or intensity.  Incorrect grip in racquet sports.  Incorrect grip size of racquet (often too large).  Incorrect hitting position or technique (usually backhand, leading with the elbow).  Using a racket that is too heavy. RISK INCREASES WITH:  Sports or occupations that require repetitive and/or strenuous forearm and wrist movements (tennis, squash, racquetball, carpentry).  Poor wrist and forearm strength and flexibility.  Failure to warm up properly before activity.  Resuming activity before healing, rehabilitation, and conditioning are complete. PREVENTION   Warm up and stretch properly before activity.  Maintain physical fitness:  Strength, flexibility, and endurance.  Cardiovascular fitness.  Wear and use properly fitted equipment.  Learn and use proper technique and have a coach correct improper technique.  Wear a tennis elbow (counterforce) brace. PROGNOSIS  The course of  this condition depends on the degree of the injury. If treated properly, acute cases (symptoms lasting less than 4 weeks) are often resolved in 2 to 6 weeks. Chronic (longer lasting cases) often resolve in 3 to 6 months but may require physical therapy. RELATED COMPLICATIONS   Frequently recurring symptoms, resulting in a chronic problem. Properly treating the problem the first time decreases frequency of recurrence.  Chronic inflammation, scarring tendon degeneration, and partial tendon tear, requiring surgery.  Delayed healing or resolution of symptoms. TREATMENT  Treatment first involves the use of ice and medicine to reduce pain and inflammation. Strengthening and stretching exercises may help reduce discomfort if performed regularly. These exercises may be performed at home if the condition is an acute injury. Chronic cases may require a referral to a physical therapist for evaluation and treatment. Your caregiver may advise a corticosteroid injection to help reduce inflammation. Rarely, surgery is needed. MEDICATION  If pain medicine is needed, nonsteroidal anti-inflammatory medicines (aspirin and ibuprofen), or other minor pain relievers (acetaminophen), are often advised.  Do not take pain medicine for 7 days before surgery.  Prescription pain relievers may be given, if your caregiver thinks they are needed. Use only as directed and only as much as you need.  Corticosteroid injections may be recommended. These injections should be reserved only for the most severe cases, because they can only be given a certain number of times. HEAT AND COLD  Cold treatment (icing) should be applied for 10 to 15 minutes every 2 to 3 hours for inflammation and pain, and immediately after activity that aggravates your symptoms. Use ice packs or an ice massage.  Heat treatment may be used before performing stretching and strengthening activities prescribed by your caregiver, physical therapist,   or  Product/process development scientist. Use a heat pack or a warm water soak. SEEK MEDICAL CARE IF: Symptoms get worse or do not improve in 2 weeks, despite treatment. EXERCISES  RANGE OF MOTION (ROM) AND STRETCHING EXERCISES - Epicondylitis, Lateral (Tennis Elbow) These exercises may help you when beginning to rehabilitate your injury. Your symptoms may go away with or without further involvement from your physician, physical therapist, or athletic trainer. While completing these exercises, remember:   Restoring tissue flexibility helps normal motion to return to the joints. This allows healthier, less painful movement and activity.  An effective stretch should be held for at least 30 seconds.  A stretch should never be painful. You should only feel a gentle lengthening or release in the stretched tissue. RANGE OF MOTION - Wrist Flexion, Active-Assisted  Extend your right / left elbow with your fingers pointing down.*  Gently pull the back of your hand towards you, until you feel a gentle stretch on the top of your forearm.  Hold this position for ______30____ seconds. Repeat _____2_____ times. Complete this exercise _____2_____ times per day.  *If directed by your physician, physical therapist or athletic trainer, complete this stretch with your elbow bent, rather than extended. RANGE OF MOTION - Wrist Extension, Active-Assisted  Extend your right / left elbow and turn your palm upwards.*  Gently pull your palm and fingertips back, so your wrist extends and your fingers point more toward the ground.  You should feel a gentle stretch on the inside of your forearm.  Hold this position for ___30_______ seconds. Repeat _____2_____ times. Complete this exercise _____2_____ times per day. *If directed by your physician, physical therapist or athletic trainer, complete this stretch with your elbow bent, rather than extended. STRETCH - Wrist Flexion  Place the back of your right / left hand on a tabletop,  leaving your elbow slightly bent. Your fingers should point away from your body.  Gently press the back of your hand down onto the table by straightening your elbow. You should feel a stretch on the top of your forearm.  Hold this position for ______30____ seconds. Repeat _____2_____ times. Complete this stretch _____2_____ times per day.  STRETCH - Wrist Extension   Place your right / left fingertips on a tabletop, leaving your elbow slightly bent. Your fingers should point backwards.  Gently press your fingers and palm down onto the table by straightening your elbow. You should feel a stretch on the inside of your forearm.  Hold this position for _____30_____ seconds. Repeat _____2_____ times. Complete this stretch _____2_____ times per day.  STRENGTHENING EXERCISES - Epicondylitis, Lateral (Tennis Elbow) These exercises may help you when beginning to rehabilitate your injury. They may resolve your symptoms with or without further involvement from your physician, physical therapist, or athletic trainer. While completing these exercises, remember:   Muscles can gain both the endurance and the strength needed for everyday activities through controlled exercises.  Complete these exercises as instructed by your physician, physical therapist or athletic trainer. Increase the resistance and repetitions only as guided.  You may experience muscle soreness or fatigue, but the pain or discomfort you are trying to eliminate should never worsen during these exercises. If this pain does get worse, stop and make sure you are following the directions exactly. If the pain is still present after adjustments, discontinue the exercise until you can discuss the trouble with your caregiver. STRENGTH - Wrist Flexors  Sit with your right / left forearm palm-up and fully supported  on a table or countertop. Your elbow should be resting below the height of your shoulder. Allow your wrist to extend over the edge of  the surface.  Loosely holding a soda can, or a piece of rubber exercise band or tubing, slowly curl your hand up toward your forearm.  Hold this position for ____30______ seconds. Slowly lower the wrist back to the starting position in a controlled manner. Repeat _____2_____ times. Complete this exercise _____2_____ times per day.  STRENGTH - Wrist Extensors  Sit with your right / left forearm palm-down and fully supported on a table or countertop. Your elbow should be resting below the height of your shoulder. Allow your wrist to extend over the edge of the surface.  Loosely holding a soda can, or a piece of rubber exercise band or tubing, slowly curl your hand up toward your forearm.  Hold this position for _____30_____ seconds. Slowly lower the wrist back to the starting position in a controlled manner. Repeat ____2______ times. Complete this exercise _____2_____ times per day.  STRENGTH - Ulnar Deviators  Stand with a soda can in your right / left hand, or sit while holding a rubber exercise band or tubing, with your healthy arm supported on a table or countertop.  Move your wrist, so that your pinkie travels toward your forearm and your thumb moves away from your forearm.  Hold this position for _____2_____ seconds and then slowly lower the wrist back to the starting position. Repeat ____2______ times. Complete this exercise _____2_____ times per day STRENGTH - Radial Deviators  Stand with a __soda can in your right / left hand, or sit while holding a rubber exercise band or tubing, with your injured arm supported on a table or countertop.  Raise your hand upward in front of you or pull up on the rubber tubing.  Hold this position for ____30______ seconds and then slowly lower the wrist back to the starting position. Repeat _____2_____ times. Complete this exercise _____2_____ times per day. STRENGTH - Forearm Supinators   Sit with your right / left forearm supported on a table,  keeping your elbow below shoulder height. Rest your hand over the edge, palm down.  Gently grip a hammer or a soup ladle.  Without moving your elbow, slowly turn your palm and hand upward to a "thumbs-up" position.  Hold this position for ______30____ seconds. Slowly return to the starting position. Repeat _____2_____ times. Complete this exercise _______2___ times per day.  STRENGTH - Forearm Pronators   Sit with your right / left forearm supported on a table, keeping your elbow below shoulder height. Rest your hand over the edge, palm up.  Gently grip a hammer or a soup ladle.  Without moving your elbow, slowly turn your palm and hand upward to a "thumbs-up" position.  Hold this position for ____30______ seconds. Slowly return to the starting position. Repeat _____2_____ times. Complete this exercise ______2____ times per day.  STRENGTH - Grip  Grasp a tennis ball, a dense sponge, or a large, rolled sock in your hand.  Squeeze as hard as you can, without increasing any pain.  Hold this position for 30 seconds. Release your grip slowly. Repeat _2 times. Complete this exercise _____2_____ times per day.  STRENGTH - Elbow Extensors, Isometric  Stand or sit upright, on a firm surface. Place your right / left arm so that your palm faces your stomach, and it is at the height of your waist.  Place your opposite hand on the underside  of your forearm. Gently push up as your right / left arm resists. Push as hard as you can with both arms, without causing any pain or movement at your right / left elbow. Hold this stationary position for 30 seconds. Gradually release the tension in both arms. Allow your muscles to relax completely before repeating.   This information is not intended to replace advice given to you by your health care provider. Make sure you discuss any questions you have with your health care provider.   Document Released: 12/17/2005 Document Revised: 01/07/2015 Document  Reviewed: 03/31/2009 Elsevier Interactive Patient Education [employer].

## 2016-02-09 NOTE — Progress Notes (Signed)
BP 125/71 mmHg  Pulse 81  Temp(Src) 98.2 F (36.8 C)  Ht 5' 5.3" (1.659 m)  Wt 182 lb (82.555 kg)  BMI 30.00 kg/m2  SpO2 99%   Subjective:    Patient ID: Jesus Guerrero, male    DOB: November 06, 1971, 45 y.o.   MRN: [id number]  HPI: Jesus Guerrero is a 45 y.o. male  Chief Complaint  Patient presents with  . Rash   Notes that his cellulitis has gotten much better and that he no longer has any discharge.   ARM PAIN Duration: 4-5 weeks Location: R elbow Mechanism of injury: Thinks he got a bug bite Onset: sudden Severity: 7/10  Quality:  aching and sore Frequency: constant Radiation: no Aggravating factors: movement   Alleviating factors: nothing   Status: stable Treatments attempted: ice and aleve  Relief with NSAIDs?:  mild Swelling: yes Redness: no  Warmth: no Trauma: no Chest pain: no  Shortness of breath: no  Fever: no Decreased sensation: no Paresthesias: no Weakness: no  Relevant past medical, surgical, family and social history reviewed and updated as indicated. Interim medical history since our last visit reviewed. Allergies and medications reviewed and updated.  Review of Systems  Constitutional: Negative.   Respiratory: Negative.   Cardiovascular: Negative.   Genitourinary: Negative.   Musculoskeletal: Positive for arthralgias. Negative for myalgias, back pain, joint swelling, gait problem, neck pain and neck stiffness.  Psychiatric/Behavioral: Negative.     Per HPI unless specifically indicated above     Objective:    BP 125/71 mmHg  Pulse 81  Temp(Src) 98.2 F (36.8 C)  Ht 5' 5.3" (1.659 m)  Wt 182 lb (82.555 kg)  BMI 30.00 kg/m2  SpO2 99%  Wt Readings from Last 3 Encounters:  02/09/16 182 lb (82.555 kg)  01/31/16 180 lb (81.647 kg)  01/25/16 185 lb (83.915 kg)    Physical Exam  Constitutional: He is oriented to person, place, and time. He appears well-developed and well-nourished. No distress.  HENT:  Head: Normocephalic and  atraumatic.  Right Ear: Hearing normal.  Left Ear: Hearing normal.  Nose: Nose normal.  Eyes: Conjunctivae and lids are normal. Right eye exhibits no discharge. Left eye exhibits no discharge. No scleral icterus.  Pulmonary/Chest: Effort normal. No respiratory distress.  Genitourinary: Penis normal. No penile tenderness.  Musculoskeletal: Normal range of motion. He exhibits edema and tenderness.  Mild swelling over the lateral epicondyle on the R, mild tenderness, no redness, no heat FROM  Neurological: He is alert and oriented to person, place, and time.  Skin: Skin is warm, dry and intact. Rash noted. No erythema. No pallor.  Erythematous excoriated spots on his hip and bottom, annular  Psychiatric: He has a normal mood and affect. His speech is normal and behavior is normal. Judgment and thought content normal. Cognition and memory are normal.    Results for orders placed or performed in visit on 01/31/16  Wound culture  Result Value Ref Range   Gram Stain Result Final report    Result 1 Comment    RESULT 2 No organisms seen    Aerobic Bacterial Culture Final report    Result 1 Mixed skin flora       Assessment & Plan:   Problem List Items Addressed This Visit      Musculoskeletal and Integument   Eczema    Will treat with triamcinalone cream. Let us know if not getting better or getting worse. Recheck 1 month.  Other Visit Diagnoses    Elbow swelling, right    -  Primary    Seems to be due to lateral epicondylitis. Will treat with exercises and naproxen, if not better in 4 weeks, will consider steroid shot. Continue to monitor. ICE    Relevant Orders    Lyme Ab/Western Blot Reflex    Rocky mtn spotted fvr abs pnl(IgG+IgM)    Ehrlichia Antibody Panel    Babesia microti Antibody Panel    Penile cellulitis        Resolved with antibiotics. Complete course. Let us know if not getting better or getting worse.         Follow up plan: Return in about 4 weeks  (around 03/08/2016) for Follow up on Elbow.

## 2016-02-09 NOTE — Assessment & Plan Note (Signed)
Will treat with triamcinalone cream. Let us know if not getting better or getting worse. Recheck 1 month.

## 2016-02-11 LAB — BABESIA MICROTI ANTIBODY PANEL

## 2016-02-11 LAB — EHRLICHIA ANTIBODY PANEL
E. CHAFFEENSIS (HME) IGM TITER: NEGATIVE
E. CHAFFEENSIS IGG AB: NEGATIVE
HGE IGM TITER: NEGATIVE
HGE IgG Titer: NEGATIVE

## 2016-02-11 LAB — LYME AB/WESTERN BLOT REFLEX

## 2016-02-11 LAB — ROCKY MTN SPOTTED FVR ABS PNL(IGG+IGM)
RMSF IGG: NEGATIVE
RMSF IgM: 0.31 index (ref 0.00–0.89)

## 2016-02-13 ENCOUNTER — Telehealth: Payer: Self-pay | Admitting: Family Medicine

## 2016-02-13 NOTE — Telephone Encounter (Signed)
Please let Jesus Guerrero know that he has no tick diseases. Everything came back negative.

## 2016-02-13 NOTE — Telephone Encounter (Signed)
Patient notified

## 2016-03-08 ENCOUNTER — Encounter: Payer: Self-pay | Admitting: Family Medicine

## 2016-03-08 ENCOUNTER — Ambulatory Visit (INDEPENDENT_AMBULATORY_CARE_PROVIDER_SITE_OTHER): Payer: Medicaid Other | Admitting: Family Medicine

## 2016-03-08 VITALS — BP 128/75 | HR 83 | Temp 97.7°F | Ht 65.3 in | Wt 186.0 lb

## 2016-03-08 DIAGNOSIS — M25421 Effusion, right elbow: Secondary | ICD-10-CM | POA: Diagnosis not present

## 2016-03-08 DIAGNOSIS — M7711 Lateral epicondylitis, right elbow: Secondary | ICD-10-CM | POA: Diagnosis not present

## 2016-03-08 NOTE — Progress Notes (Signed)
BP 128/75 mmHg  Pulse 83  Temp(Src) 97.7 F (36.5 C)  Ht 5' 5.3" (1.659 m)  Wt 186 lb (84.369 kg)  BMI 30.65 kg/m2  SpO2 97%   Subjective:    Patient ID: Jesus Guerrero, male    DOB: 1971-04-25, 45 y.o.   MRN: [id number]  HPI: Jesus Guerrero is a 45 y.o. male  Chief Complaint  Patient presents with  . Elbow Pain    right   ARM PAIN  Duration: chronic Location: right elbow Mechanism of injury: unknown Onset: sudden Severity: 5/10  Quality:  Sharp. Frequency: a few times a week Radiation: no Aggravating factors:movement   Alleviating factors: rest   Status: better Treatments attempted:naproxen  Relief with NSAIDs?:  mild Swelling: yes Redness: yes  Warmth: yes Trauma: no Chest pain: no  Shortness of breath: no  Fever: no Decreased sensation: no Paresthesias: no Weakness: no  Relevant past medical, surgical, family and social history reviewed and updated as indicated. Interim medical history since our last visit reviewed. Allergies and medications reviewed and updated.  Review of Systems  Constitutional: Negative.   Respiratory: Negative.   Cardiovascular: Negative.   Genitourinary: Negative.   Musculoskeletal: Positive for arthralgias. Negative for myalgias, back pain, joint swelling, gait problem and neck pain.  Psychiatric/Behavioral: Negative.     Per HPI unless specifically indicated above     Objective:    BP 128/75 mmHg  Pulse 83  Temp(Src) 97.7 F (36.5 C)  Ht 5' 5.3" (1.659 m)  Wt 186 lb (84.369 kg)  BMI 30.65 kg/m2  SpO2 97%  Wt Readings from Last 3 Encounters:  03/08/16 186 lb (84.369 kg)  02/09/16 182 lb (82.555 kg)  01/31/16 180 lb (81.647 kg)    Physical Exam  Constitutional: He is oriented to person, place, and time. He appears well-developed and well-nourished. No distress.  HENT:  Head: Normocephalic and atraumatic.  Right Ear: Hearing and external ear normal.  Left Ear: Hearing and external ear normal.  Nose: Nose  normal.  Eyes: Conjunctivae and lids are normal. Right eye exhibits no discharge. Left eye exhibits no discharge. No scleral icterus.  Cardiovascular: Normal rate, regular rhythm and normal heart sounds.   Pulmonary/Chest: Effort normal and breath sounds normal. No respiratory distress.  Musculoskeletal: Normal range of motion. He exhibits edema and tenderness.  Mild swelling and tenderness to right lateral epicondyle. No erythema. FROM.   Neurological: He is alert and oriented to person, place, and time.  Skin: Skin is warm, dry and intact. No rash noted.  Psychiatric: He has a normal mood and affect. His speech is normal and behavior is normal. Judgment and thought content normal. Cognition and memory are normal.        Assessment & Plan:   Problem List Items Addressed This Visit    None    Visit Diagnoses    Lateral epicondylitis, right    -  Primary    Exercises and naproxen not resolving pain. Will give steroid shot. See below. Patient tolerated well. Call with any concerns.        Lateral Epicondyle INJECTION  Procedure: lateral epicondyle injection of the R elbow  Diagnosis: R Lateral epicondylitis   Physician: MJ Consent: Risks benefits, and alternative treatments discussed and all questions were answered. Patient elected to proceed and verbal consent obtained.  Description:Area prepped using semi-sterile technique. 25 gauge needle introducedto the point of maximum pain on the R lateral epicondyle 1 cc of 0.5% marcaine and 40mg  of  triamcinalone administered. Patient tolerated the procedure well and had a significant decrease in his pain immediately following treatment. He left the room in good condition.  Complications: none Post Procedure Instructions: He will keep area clean and call if any bleeding, redness or pain.   Follow up plan: Return if symptoms worsen or fail to improve.

## 2016-03-21 ENCOUNTER — Encounter: Payer: Self-pay | Admitting: Podiatry

## 2016-03-21 ENCOUNTER — Ambulatory Visit (INDEPENDENT_AMBULATORY_CARE_PROVIDER_SITE_OTHER): Payer: Medicaid Other | Admitting: Podiatry

## 2016-03-21 VITALS — BP 129/76 | HR 83 | Resp 16

## 2016-03-21 DIAGNOSIS — M2012 Hallux valgus (acquired), left foot: Secondary | ICD-10-CM

## 2016-03-21 DIAGNOSIS — M722 Plantar fascial fibromatosis: Secondary | ICD-10-CM

## 2016-03-21 NOTE — Progress Notes (Signed)
He presents today for follow-up of his first metatarsophalangeal joint. He states that the plantar fasciitis is resolving but the first metatarsophalangeal joint is still painful. Last time he was here we injected it with cortisone. He states that it feels much better than it did but is still not well.  Objective: Vital signs are stable he is alert and oriented 3 pulses are palpable. No open lesions or wounds. No calf pain. Limited range of motion first metatarsophalangeal joint left foot with mild edema. No signs of infection.  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  Plan: Reinjected today with dexamethasone and local anesthetic discussed the possible need for surgery. Follow up with him in 1 month

## 2016-04-03 ENCOUNTER — Encounter: Payer: Self-pay | Admitting: Family Medicine

## 2016-04-03 ENCOUNTER — Ambulatory Visit (INDEPENDENT_AMBULATORY_CARE_PROVIDER_SITE_OTHER): Payer: Medicaid Other | Admitting: Family Medicine

## 2016-04-03 DIAGNOSIS — E785 Hyperlipidemia, unspecified: Secondary | ICD-10-CM

## 2016-04-03 NOTE — Patient Instructions (Signed)
Fat and Cholesterol Restricted Diet  Getting too much fat and cholesterol in your diet may cause health problems. Following this diet helps keep your fat and cholesterol at normal levels. This can keep you from getting sick.  WHAT TYPES OF FAT SHOULD I CHOOSE?  · Choose monosaturated and polyunsaturated fats. These are found in foods such as olive oil, canola oil, flaxseeds, walnuts, almonds, and seeds.  · Eat more omega-3 fats. Good choices include salmon, mackerel, sardines, tuna, flaxseed oil, and ground flaxseeds.  · Limit saturated fats. These are in animal products such as meats, butter, and cream. They can also be in plant products such as palm oil, palm kernel oil, and coconut oil.  ·  Avoid foods with partially hydrogenated oils in them. These contain trans fats. Examples of foods that have trans fats are stick margarine, some tub margarines, cookies, crackers, and other baked goods.  WHAT GENERAL GUIDELINES DO I NEED TO FOLLOW?   · Check food labels. Look for the words "trans fat" and "saturated fat."  · When preparing a meal:    Fill half of your plate with vegetables and green salads.    Fill one fourth of your plate with whole grains. Look for the word "whole" as the first word in the ingredient list.    Fill one fourth of your plate with lean protein foods.  · Limit fruit to two servings a day. Choose fruit instead of juice.  · Eat more foods with soluble fiber. Examples of foods with this type of fiber are apples, broccoli, carrots, beans, peas, and barley. Try to get 20-30 g (grams) of fiber per day.  · Eat more home-cooked foods. Eat less at restaurants and buffets.  · Limit or avoid alcohol.  · Limit foods high in starch and sugar.  · Limit fried foods.  · Cook foods without frying them. Baking, boiling, grilling, and broiling are all great options.  · Lose weight if you are overweight. Losing even a small amount of weight can help your overall health. It can also help prevent diseases such as  diabetes and heart disease.  WHAT FOODS CAN I EAT?  Grains  Whole grains, such as whole wheat or whole grain breads, crackers, cereals, and pasta. Unsweetened oatmeal, bulgur, barley, quinoa, or brown rice. Corn or whole wheat flour tortillas.  Vegetables  Fresh or frozen vegetables (raw, steamed, roasted, or grilled). Green salads.  Fruits  All fresh, canned (in natural juice), or frozen fruits.  Meat and Other Protein Products  Ground beef (85% or leaner), grass-fed beef, or beef trimmed of fat. Skinless chicken or turkey. Ground chicken or turkey. Pork trimmed of fat. All fish and seafood. Eggs. Dried beans, peas, or lentils. Unsalted nuts or seeds. Unsalted canned or dry beans.  Dairy  Low-fat dairy products, such as skim or 1% milk, 2% or reduced-fat cheeses, low-fat ricotta or cottage cheese, or plain low-fat yogurt.  Fats and Oils  Tub margarines without trans fats. Light or reduced-fat mayonnaise and salad dressings. Avocado. Olive, canola, sesame, or safflower oils. Natural peanut or almond butter (choose ones without added sugar and oil).  The items listed above may not be a complete list of recommended foods or beverages. Contact your dietitian for more options.  WHAT FOODS ARE NOT RECOMMENDED?  Grains  White bread. White pasta. White rice. Cornbread. Bagels, pastries, and croissants. Crackers that contain trans fat.  Vegetables  White potatoes. Corn. Creamed or fried vegetables. Vegetables in a cheese sauce.    Fruits  Dried fruits. Canned fruit in light or heavy syrup. Fruit juice.  Meat and Other Protein Products  Fatty cuts of meat. Ribs, chicken wings, bacon, sausage, bologna, salami, chitterlings, fatback, hot dogs, bratwurst, and packaged luncheon meats. Liver and organ meats.  Dairy  Whole or 2% milk, cream, half-and-half, and cream cheese. Whole milk cheeses. Whole-fat or sweetened yogurt. Full-fat cheeses. Nondairy creamers and whipped toppings. Processed cheese, cheese spreads, or cheese  curds.  Sweets and Desserts  Corn syrup, sugars, honey, and molasses. Candy. Jam and jelly. Syrup. Sweetened cereals. Cookies, pies, cakes, donuts, muffins, and ice cream.  Fats and Oils  Butter, stick margarine, lard, shortening, ghee, or bacon fat. Coconut, palm kernel, or palm oils.  Beverages  Alcohol. Sweetened drinks (such as sodas, lemonade, and fruit drinks or punches).  The items listed above may not be a complete list of foods and beverages to avoid. Contact your dietitian for more information.     This information is not intended to replace advice given to you by your health care provider. Make sure you discuss any questions you have with your health care provider.     Document Released: 06/17/2012 Document Revised: 01/07/2015 Document Reviewed: 03/18/2014  Elsevier Interactive Patient Education ©2016 Elsevier Inc.

## 2016-04-03 NOTE — Progress Notes (Signed)
BP 128/72 mmHg  Pulse 75  Temp(Src) 97.6 F (36.4 C)  Ht 5\' 5"  (1.651 m)  Wt 185 lb (83.915 kg)  BMI 30.79 kg/m2  SpO2 98%   Subjective:    Patient ID: Jesus Guerrero, male    DOB: 1971-08-29, 45 y.o.   MRN: [id number]  HPI: Jesus Guerrero is a 45 y.o. male  Chief Complaint  Patient presents with  . Lab Result follow up    Patient states that he is here to follow up on his lab results   [name]- [facility] a [facility], doesn't have control over what he eats  Hyperlipidemia status: uncontrolled Satisfied with current treatment?  Not on anything Past cholesterol meds: none Supplements: none Aspirin:  no Chest pain:  no Coronary artery disease:  no Family history CAD:  no Family history early CAD:  no  Otherwise states that he is doing well. No concerns. Feeling safe. No one hurting him. Not sure wh  Relevant past medical, surgical, family and social history reviewed and updated as indicated. Interim medical history since our last visit reviewed. Allergies and medications reviewed and updated.  Review of Systems  Constitutional: Negative.   Respiratory: Negative.   Cardiovascular: Negative.   Psychiatric/Behavioral: Negative.     Per HPI unless specifically indicated above     Objective:    BP 128/72 mmHg  Pulse 75  Temp(Src) 97.6 F (36.4 C)  Ht 5\' 5"  (1.651 m)  Wt 185 lb (83.915 kg)  BMI 30.79 kg/m2  SpO2 98%  Wt Readings from Last 3 Encounters:  04/03/16 185 lb (83.915 kg)  03/08/16 186 lb (84.369 kg)  02/09/16 182 lb (82.555 kg)    Physical Exam  Constitutional: He is oriented to person, place, and time. He appears well-developed and well-nourished. No distress.  HENT:  Head: Normocephalic and atraumatic.  Right Ear: Hearing normal.  Left Ear: Hearing normal.  Nose: Nose normal.  Eyes: Conjunctivae and lids are normal. Right eye exhibits no discharge. Left eye exhibits no discharge. No scleral icterus.  Cardiovascular: Normal rate,  regular rhythm, normal heart sounds and intact distal pulses.  Exam reveals no gallop and no friction rub.   No murmur heard. Pulmonary/Chest: Effort normal and breath sounds normal. No respiratory distress. He has no wheezes. He has no rales. He exhibits no tenderness.  Musculoskeletal: Normal range of motion.  Neurological: He is alert and oriented to person, place, and time.  Skin: Skin is warm, dry and intact. No rash noted. No erythema. No pallor.  Psychiatric: He has a normal mood and affect. His speech is normal and behavior is normal. Judgment and thought content normal. Cognition and memory are normal.  Nursing note and vitals reviewed.   Results for orders placed or performed in visit on 02/09/16  Lyme Ab/Western Blot Reflex  Result Value Ref Range   Lyme IgG/IgM Ab <0.91 0.00 - 0.90 ISR   LYME DISEASE AB, QUANT, IGM <0.80 0.00 - 0.79 index  Rocky mtn spotted fvr abs pnl(IgG+IgM)  Result Value Ref Range   RMSF IgG Negative Negative   RMSF IgM 0.31 0.00 - [id number] index  Ehrlichia Antibody Panel  Result Value Ref Range   E.Chaffeensis (HME) IgG Negative Neg:<1:64   E. Chaffeensis (HME) IgM Titer Negative Neg:<1:20   HGE IgG Titer Negative Neg:<1:64   HGE IgM Titer Negative Neg:<1:20  Babesia microti Antibody Panel  Result Value Ref Range   Babesia microti IgM <1:10 Neg:<1:10   Babesia microti IgG <  1:10 Neg:<1:10      Assessment & Plan:   Problem List Items Addressed This Visit      Other   Hyperlipidemia    Rechecking labs today fasting. Will treat as needed. Will try to work on diet as much as he can. Call with any concerns.           Follow up plan: Return in about 3 months (around 07/03/2016) for follow up.

## 2016-04-03 NOTE — Assessment & Plan Note (Addendum)
Rechecking labs today fasting. Will treat as needed. Will try to work on diet as much as he can. Call with any concerns.

## 2016-04-04 ENCOUNTER — Encounter: Payer: Self-pay | Admitting: Family Medicine

## 2016-04-04 ENCOUNTER — Telehealth: Payer: Self-pay | Admitting: Family Medicine

## 2016-04-04 LAB — LIPID PANEL W/O CHOL/HDL RATIO
Cholesterol, Total: 243 mg/dL — ABNORMAL HIGH (ref 100–199)
HDL: 47 mg/dL (ref >39)
LDL Calculated: 144 mg/dL — ABNORMAL HIGH (ref 0–99)
Triglycerides: 259 mg/dL — ABNORMAL HIGH (ref 0–149)
VLDL Cholesterol Cal: 52 mg/dL — ABNORMAL HIGH (ref 5–40)

## 2016-04-04 NOTE — Telephone Encounter (Signed)
Letter generated and sent.

## 2016-04-23 ENCOUNTER — Encounter: Payer: Self-pay | Admitting: Podiatry

## 2016-04-23 ENCOUNTER — Ambulatory Visit (INDEPENDENT_AMBULATORY_CARE_PROVIDER_SITE_OTHER): Payer: Medicaid Other | Admitting: Podiatry

## 2016-04-23 DIAGNOSIS — M778 Other enthesopathies, not elsewhere classified: Secondary | ICD-10-CM

## 2016-04-23 DIAGNOSIS — M722 Plantar fascial fibromatosis: Secondary | ICD-10-CM | POA: Diagnosis not present

## 2016-04-23 DIAGNOSIS — M779 Enthesopathy, unspecified: Principal | ICD-10-CM

## 2016-04-24 NOTE — Progress Notes (Signed)
He presents today with a chief complaint of painful first metatarsophalangeal joint left foot. He states that it feels much better than it did prior to her last injection.  Objective: Vital signs are stable he is alert and oriented 3. Pulses are palpable. He has limited range of motion but much decrease in inflammation at the first metatarsophalangeal joint of the left foot pulses remain palpable no open lesions and no calf pain.  Assessment: Capsulitis with hallux limitus first metatarsophalangeal joint left foot.  Plan: I injected the area today with Kenalog and local anesthetic into the first metatarsophalangeal joint of the left foot. I will follow-up with him in approximately 2 months. If he is not improved we will consider surgical intervention.

## 2016-05-12 ENCOUNTER — Encounter: Payer: Self-pay | Admitting: Emergency Medicine

## 2016-05-12 ENCOUNTER — Emergency Department
Admission: EM | Admit: 2016-05-12 | Discharge: 2016-05-12 | Disposition: A | Discharge status: Home / Self Care | Payer: Medicaid Other | Attending: Emergency Medicine | Admitting: Emergency Medicine

## 2016-05-12 DIAGNOSIS — F840 Autistic disorder: Secondary | ICD-10-CM | POA: Insufficient documentation

## 2016-05-12 DIAGNOSIS — R05 Cough: Secondary | ICD-10-CM | POA: Diagnosis present

## 2016-05-12 DIAGNOSIS — E785 Hyperlipidemia, unspecified: Secondary | ICD-10-CM | POA: Insufficient documentation

## 2016-05-12 DIAGNOSIS — J069 Acute upper respiratory infection, unspecified: Secondary | ICD-10-CM | POA: Diagnosis not present

## 2016-05-12 MED ORDER — PSEUDOEPH-BROMPHEN-DM 30-2-10 MG/5ML PO SYRP: 10.0000 mL | ORAL_SOLUTION | Freq: Four times a day (QID) | ORAL | Status: DC | PRN

## 2016-05-12 NOTE — Discharge Instructions (Signed)
Viral Infections °A viral infection can be caused by different types of viruses. Most viral infections are not serious and resolve on their own. However, some infections may cause severe symptoms and may lead to further complications. °SYMPTOMS °Viruses can frequently cause: °· Minor sore throat. °· Aches and pains. °· Headaches. °· Runny nose. °· Different types of rashes. °· Watery eyes. °· Tiredness. °· Cough. °· Loss of appetite. °· Gastrointestinal infections, resulting in nausea, vomiting, and diarrhea. °These symptoms do not respond to antibiotics because the infection is not caused by bacteria. However, you might catch a bacterial infection following the viral infection. This is sometimes called a "superinfection." Symptoms of such a bacterial infection may include: °· Worsening sore throat with pus and difficulty swallowing. °· Swollen neck glands. °· Chills and a high or persistent fever. °· Severe headache. °· Tenderness over the sinuses. °· Persistent overall ill feeling (malaise), muscle aches, and tiredness (fatigue). °· Persistent cough. °· Yellow, green, or brown mucus production with coughing. °HOME CARE INSTRUCTIONS  °· Only take over-the-counter or prescription medicines for pain, discomfort, diarrhea, or fever as directed by your caregiver. °· Drink enough water and fluids to keep your urine clear or pale yellow. Sports drinks can provide valuable electrolytes, sugars, and hydration. °· Get plenty of rest and maintain proper nutrition. Soups and broths with crackers or rice are fine. °SEEK IMMEDIATE MEDICAL CARE IF:  °· You have severe headaches, shortness of breath, chest pain, neck pain, or an unusual rash. °· You have uncontrolled vomiting, diarrhea, or you are unable to keep down fluids. °· You or your child has an oral temperature above 102° F (38.9° C), not controlled by medicine. °· Your baby is older than 3 months with a rectal temperature of 102° F (38.9° C) or higher. °· Your baby is 3  months old or younger with a rectal temperature of 100.4° F (38° C) or higher. °MAKE SURE YOU:  °· Understand these instructions. °· Will watch your condition. °· Will get help right away if you are not doing well or get worse. °  °This information is not intended to replace advice given to you by your health care provider. Make sure you discuss any questions you have with your health care provider. °  °Document Released: 09/26/2005 Document Revised: 03/10/2012 Document Reviewed: 05/25/2015 °Elsevier Interactive Patient Education ©2016 Elsevier Inc. ° °

## 2016-05-12 NOTE — ED Notes (Signed)
Patient reports cough and congestion x3 days. Denies chest pain. Patient in no acute distress.

## 2016-05-12 NOTE — ED Provider Notes (Signed)
[facility] Emergency Department Provider Note  ____________________________________________  Time seen: Approximately 1:25 PM  I have reviewed the triage vital signs and the nursing notes.   HISTORY  Chief Complaint Cough and Nasal Congestion    HPI Jesus Guerrero is a 45 y.o. male , NAD, presents to emergency room with 3 day history of cough and congestion. States he's been taking Alka-Seltzer cold over-the-counter without any alleviation of symptoms. States he was told he "looked like he had a fever" yesterday. Has not taken his temperature at this time. Denies any chills, body aches. Has not had any chest pain, shortness breath, wheezing. No abdominal pain, nausea, vomiting.   Past Medical History  Diagnosis Date  . Autism   . Prostatitis   . Suspicious nevus   . GERD (gastroesophageal reflux disease)   . Overweight   . UTI (lower urinary tract infection)   . Constipation   . Gout   . High cholesterol   . History of kidney stones     Patient Active Problem List   Diagnosis Date Noted  . Eczema 02/09/2016  . Hyperlipidemia 01/16/2016  . Bladder wall thickening 06/15/2015  . Microscopic hematuria 06/15/2015  . Abdominal pain 06/09/2015  . Acute anxiety 06/09/2015  . Autism   . Prostatitis   . GERD (gastroesophageal reflux disease)   . Overweight     Past Surgical History  Procedure Laterality Date  . Cholecystectomy  2014  . Cystoscopy w/ retrogrades Bilateral 06/29/2015    Procedure: CYSTOSCOPY WITH RETROGRADE PYELOGRAM;  Surgeon: Hollice Espy, MD;  Location: [facility] ORS;  Service: Urology;  Laterality: Bilateral;    Current Outpatient Rx  Name  Route  Sig  Dispense  Refill  . brompheniramine-pseudoephedrine-DM 30-2-10 MG/5ML syrup   Oral   Take 10 mLs by mouth 4 (four) times daily as needed.   200 mL   0   . loratadine (CLARITIN) 10 MG tablet   Oral   Take 10 mg by mouth daily.         . psyllium (METAMUCIL SMOOTH  TEXTURE) 28 % packet   Oral   Take 1 packet by mouth 2 (two) times daily.   30 packet   3     Allergies Bactrim and Codeine  Family History  Problem Relation Age of Onset  . Congestive Heart Failure Father   . Kidney failure Father     Social History Social History  Substance Use Topics  . Smoking status: Never Smoker   . Smokeless tobacco: Never Used  . Alcohol Use: No     Review of Systems  Constitutional: No fever/chills, Fatigue Eyes: No visual changes. No discharge, redness, swelling ENT: No sore throat, nasal congestion, runny nose, sneezing, sinus pressure, ear pain. Cardiovascular: No chest pain. Respiratory: Positive cough, chest congestion. No shortness of breath. No wheezing.  Gastrointestinal: No abdominal pain.  No nausea, vomiting.  No diarrhea.   Musculoskeletal: Negative for general myalgias.  Skin: Negative for rash. Neurological: Negative for headaches, focal weakness or numbness. No tingling 10-point ROS otherwise negative.  ____________________________________________   PHYSICAL EXAM:  VITAL SIGNS: ED Triage Vitals  Enc Vitals Group     BP 05/12/16 1305 129/67 mmHg     Pulse Rate 05/12/16 1305 98     Resp 05/12/16 1305 18     Temp 05/12/16 1305 98.2 F (36.8 C)     Temp Source 05/12/16 1305 Oral     SpO2 05/12/16 1305 98 %  Weight 05/12/16 1305 185 lb (83.915 kg)     Height 05/12/16 1305 5\' 4"  (1.626 m)     Head Cir --      Peak Flow --      Pain Score 05/12/16 1318 1     Pain Loc --      Pain Edu? --      Excl. in [place]? --      Constitutional: Alert and oriented. Well appearing and in no acute distress. Eyes: Conjunctivae are normal. PERRLA. EOMI without pain.  Head: Atraumatic. ENT:      Ears: TMs visualized bilaterally without effusion, bulging, perforation, erythema      Nose: No congestion/rhinnorhea.      Mouth/Throat: Mucous membranes are moist. Pharynx without erythema, swelling, exudates. Uvula is midline. Neck:  Supple with full range of motion Hematological/Lymphatic/Immunilogical: No cervical lymphadenopathy. Cardiovascular: Normal rate, regular rhythm. Normal S1 and S2.  Good peripheral circulation. Respiratory: Normal respiratory effort without tachypnea or retractions. Lungs CTAB with breath sounds noted in all lung fields. Neurologic:  Normal speech and language. No gross focal neurologic deficits are appreciated.  Skin:  Skin is warm, dry and intact. No rash noted. Psychiatric: Mood and affect are normal. Speech and behavior are normal. Patient exhibits appropriate insight and judgement.   ____________________________________________   LABS  None ____________________________________________  EKG  None ____________________________________________  RADIOLOGY  None ____________________________________________    PROCEDURES  Procedure(s) performed: None    Medications - No data to display   ____________________________________________   INITIAL IMPRESSION / ASSESSMENT AND PLAN / ED COURSE  Patient's diagnosis is consistent with viral upper respiratory infection. Patient will be discharged home with prescriptions for Bromfed-DM to take as directed. Patient is to follow up with his primary care provider if symptoms persist past this treatment course. Patient is given ED precautions to return to the ED for any worsening or new symptoms.      ____________________________________________  FINAL CLINICAL IMPRESSION(S) / ED DIAGNOSES  Final diagnoses:  Viral upper respiratory infection      NEW MEDICATIONS STARTED DURING THIS VISIT:  New Prescriptions   BROMPHENIRAMINE-PSEUDOEPHEDRINE-DM 30-2-10 MG/5ML SYRUP    Take 10 mLs by mouth 4 (four) times daily as needed.         Braxton Feathers, PA-C 05/12/16 [place], MD 05/12/16 [phone]

## 2016-05-23 ENCOUNTER — Telehealth: Payer: Self-pay | Admitting: Family Medicine

## 2016-05-23 ENCOUNTER — Ambulatory Visit (INDEPENDENT_AMBULATORY_CARE_PROVIDER_SITE_OTHER): Payer: Medicaid Other | Admitting: Family Medicine

## 2016-05-23 ENCOUNTER — Ambulatory Visit
Admission: RE | Admit: 2016-05-23 | Discharge: 2016-05-23 | Disposition: A | Discharge status: Home / Self Care | Payer: Medicaid Other | Source: Ambulatory Visit | Attending: Family Medicine | Admitting: Family Medicine

## 2016-05-23 ENCOUNTER — Encounter: Payer: Self-pay | Admitting: Family Medicine

## 2016-05-23 VITALS — BP 129/73 | HR 93 | Temp 97.9°F | Wt 189.0 lb

## 2016-05-23 DIAGNOSIS — M79671 Pain in right foot: Secondary | ICD-10-CM

## 2016-05-23 MED ORDER — NAPROXEN 500 MG PO TABS: 500.0000 mg | ORAL_TABLET | Freq: Two times a day (BID) | ORAL | Status: DC

## 2016-05-23 NOTE — Patient Instructions (Signed)
Foot Contusion  A foot contusion is a deep bruise to the foot. Contusions happen when an injury causes bleeding under the skin. Signs of bruising include pain, puffiness (swelling), and discolored skin. The contusion may turn blue, purple, or yellow. HOME CARE  Put ice on the injured area.  Put ice in a plastic bag.  Place a towel between your skin and the bag.  Leave the ice on for 15-20 minutes, 03-04 times a day.  Only take medicines as told by your doctor.  Use an elastic wrap only as told. You may remove the wrap for sleeping, showering, and bathing. Take the wrap off if you lose feeling (numb) in your toes, or they turn blue or cold. Put the wrap on more loosely.  Keep the foot raised (elevated) with pillows.  If your foot hurts, avoid standing or walking.  When your doctor says it is okay to use your foot, start using it slowly. If you have pain, lessen how much you use your foot.  See your doctor as told. GET HELP RIGHT AWAY IF:   You have more redness, puffiness, or pain in your foot.  Your puffiness or pain does not get better with medicine.  You lose feeling in your foot, or you cannot move your toes.  Your foot turns cold or blue.  You have pain when you move your toes.  Your foot feels warm.  Your contusion does not get better in 2 days. MAKE SURE YOU:   Understand these instructions.  Will watch this condition.  Will get help right away if you or your child is not doing well or gets worse.   This information is not intended to replace advice given to you by your health care provider. Make sure you discuss any questions you have with your health care provider.   Document Released: 09/25/2008 Document Revised: 06/17/2012 Document Reviewed: 08/23/2015 Elsevier Interactive Patient Education [employer].

## 2016-05-23 NOTE — Telephone Encounter (Signed)
Patient's caretaker notified

## 2016-05-23 NOTE — Progress Notes (Signed)
BP 129/73 mmHg  Pulse 93  Temp(Src) 97.9 F (36.6 C)  Wt 189 lb (85.73 kg)  SpO2 96%   Subjective:    Patient ID: Jesus Guerrero, male    DOB: 1971/06/03, 45 y.o.   MRN: [id number]  HPI: Jesus Guerrero is a 45 y.o. male  Chief Complaint  Patient presents with  . Foot Pain    right foot for about a week, no known injury. Just woke up and couldn't walk on it.   FOOT PAIN Duration: 1 week  Involved foot: right Mechanism of injury: unknown Location: outside Onset: sudden  Severity: severe  Quality:  sharp Frequency: constant Radiation: no Aggravating factors: weight bearing and walking  Alleviating factors: nothing   Status: worse Treatments attempted: ibuprofen  Relief with NSAIDs?:  no Weakness with weight bearing or walking: yes Morning stiffness: yes Swelling: yes Redness: no Bruising: yes Paresthesias / decreased sensation: no  Fevers:no  Relevant past medical, surgical, family and social history reviewed and updated as indicated. Interim medical history since our last visit reviewed. Allergies and medications reviewed and updated.  Review of Systems  Constitutional: Negative.   Respiratory: Negative.   Cardiovascular: Negative.   Musculoskeletal: Positive for arthralgias and gait problem. Negative for myalgias, back pain, joint swelling, neck pain and neck stiffness.  Psychiatric/Behavioral: Negative.     Per HPI unless specifically indicated above     Objective:    BP 129/73 mmHg  Pulse 93  Temp(Src) 97.9 F (36.6 C)  Wt 189 lb (85.73 kg)  SpO2 96%  Wt Readings from Last 3 Encounters:  05/23/16 189 lb (85.73 kg)  05/12/16 185 lb (83.915 kg)  04/03/16 185 lb (83.915 kg)    Physical Exam  Constitutional: He is oriented to person, place, and time. He appears well-developed and well-nourished. No distress.  HENT:  Head: Normocephalic and atraumatic.  Right Ear: Hearing normal.  Left Ear: Hearing normal.  Nose: Nose normal.  Eyes:  Conjunctivae and lids are normal. Right eye exhibits no discharge. Left eye exhibits no discharge. No scleral icterus.  Pulmonary/Chest: Effort normal. No respiratory distress.  Musculoskeletal:  Decreased ROM of R foot, tenderness to 5th metacarpal, trace edema to mid calf, bruising on medial arch.  Neurological: He is alert and oriented to person, place, and time.  Skin: Skin is warm, dry and intact. No rash noted. No erythema. No pallor.  Psychiatric: He has a normal mood and affect. His speech is normal and behavior is normal. Judgment and thought content normal. Cognition and memory are normal.  Nursing note and vitals reviewed.   Results for orders placed or performed in visit on 04/03/16  Lipid Panel w/o Chol/HDL Ratio  Result Value Ref Range   Cholesterol, Total 243 (H) 100 - 199 mg/dL   Triglycerides 259 (H) 0 - 149 mg/dL   HDL 47 >39 mg/dL   VLDL Cholesterol Cal 52 (H) 5 - 40 mg/dL   LDL Calculated 144 (H) 0 - 99 mg/dL      Assessment & Plan:   Problem List Items Addressed This Visit    None    Visit Diagnoses    Right foot pain    -  Primary    Will check labs to r/o gout. Will obtain x-ray. Naproxen for pain. Await results. Call with concerns. RICE.    Relevant Orders    Uric acid    Comprehensive metabolic panel    CBC with Differential/Platelet    DG Foot Complete Right  Follow up plan: Return 10-14 days, for follow up foot.

## 2016-05-23 NOTE — Telephone Encounter (Signed)
Please let him know that his x-ray was normal. 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 Thanks!

## 2016-05-24 ENCOUNTER — Telehealth: Payer: Self-pay | Admitting: Family Medicine

## 2016-05-24 LAB — COMPREHENSIVE METABOLIC PANEL
A/G RATIO: 1.4 (ref 1.2–2.2)
ALBUMIN: 4.2 g/dL (ref 3.5–5.5)
ALK PHOS: 67 IU/L (ref 39–117)
ALT: 14 IU/L (ref 0–44)
AST: 15 IU/L (ref 0–40)
BILIRUBIN TOTAL: 0.4 mg/dL (ref 0.0–1.2)
BUN / CREAT RATIO: 20 (ref 9–20)
BUN: 25 mg/dL — ABNORMAL HIGH (ref 6–24)
CO2: 24 mmol/L (ref 18–29)
Calcium: 9.1 mg/dL (ref 8.7–10.2)
Chloride: 100 mmol/L (ref 96–106)
Creatinine, Ser: 1.26 mg/dL (ref 0.76–1.27)
GFR calc Af Amer: 79 mL/min/{1.73_m2} (ref >59)
GFR calc non Af Amer: 68 mL/min/{1.73_m2} (ref >59)
GLOBULIN, TOTAL: 2.9 g/dL (ref 1.5–4.5)
GLUCOSE: 80 mg/dL (ref 65–99)
POTASSIUM: 5 mmol/L (ref 3.5–5.2)
SODIUM: 141 mmol/L (ref 134–144)
TOTAL PROTEIN: 7.1 g/dL (ref 6.0–8.5)

## 2016-05-24 LAB — CBC WITH DIFFERENTIAL/PLATELET
Basophils Absolute: 0 10*3/uL (ref 0.0–0.2)
Basos: 0 %
EOS (ABSOLUTE): 0.2 10*3/uL (ref 0.0–0.4)
Eos: 2 %
HEMATOCRIT: 40 % (ref 37.5–51.0)
Hemoglobin: 13.8 g/dL (ref 12.6–17.7)
IMMATURE GRANULOCYTES: 0 %
Immature Grans (Abs): 0 10*3/uL (ref 0.0–0.1)
LYMPHS ABS: 1.6 10*3/uL (ref 0.7–3.1)
Lymphs: 16 %
MCH: 29.6 pg (ref 26.6–33.0)
MCHC: 34.5 g/dL (ref 31.5–35.7)
MCV: 86 fL (ref 79–97)
MONOS ABS: 0.7 10*3/uL (ref 0.1–0.9)
Monocytes: 7 %
NEUTROS PCT: 75 %
Neutrophils Absolute: 7.5 10*3/uL — ABNORMAL HIGH (ref 1.4–7.0)
PLATELETS: 277 10*3/uL (ref 150–379)
RBC: 4.66 x10E6/uL (ref 4.14–5.80)
RDW: 13.9 % (ref 12.3–15.4)
WBC: 10.1 10*3/uL (ref 3.4–10.8)

## 2016-05-24 LAB — URIC ACID: URIC ACID: 8.5 mg/dL (ref 3.7–8.6)

## 2016-05-24 NOTE — Telephone Encounter (Signed)
Please let him know that his labs look nice and normal. Thanks!

## 2016-05-24 NOTE — Telephone Encounter (Signed)
Notified of normal lab results.

## 2016-06-06 ENCOUNTER — Ambulatory Visit (INDEPENDENT_AMBULATORY_CARE_PROVIDER_SITE_OTHER): Payer: Medicaid Other | Admitting: Family Medicine

## 2016-06-06 ENCOUNTER — Encounter: Payer: Self-pay | Admitting: Family Medicine

## 2016-06-06 VITALS — BP 115/70 | HR 83 | Temp 98.5°F | Wt 192.0 lb

## 2016-06-06 DIAGNOSIS — M79671 Pain in right foot: Secondary | ICD-10-CM | POA: Diagnosis not present

## 2016-06-06 DIAGNOSIS — W57XXXA Bitten or stung by nonvenomous insect and other nonvenomous arthropods, initial encounter: Secondary | ICD-10-CM | POA: Diagnosis not present

## 2016-06-06 DIAGNOSIS — T148 Other injury of unspecified body region: Secondary | ICD-10-CM

## 2016-06-06 DIAGNOSIS — L309 Dermatitis, unspecified: Secondary | ICD-10-CM | POA: Diagnosis not present

## 2016-06-06 DIAGNOSIS — R609 Edema, unspecified: Secondary | ICD-10-CM | POA: Diagnosis not present

## 2016-06-06 MED ORDER — NAPROXEN 500 MG PO TABS: 500.0000 mg | ORAL_TABLET | Freq: Two times a day (BID) | ORAL | Status: DC

## 2016-06-06 MED ORDER — TRIAMCINOLONE ACETONIDE 0.5 % EX OINT: 1.0000 "application " | TOPICAL_OINTMENT | Freq: Two times a day (BID) | CUTANEOUS | Status: DC

## 2016-06-06 NOTE — Assessment & Plan Note (Signed)
Refill of triamcinalone given. Call if not getting better or getting worse.

## 2016-06-06 NOTE — Progress Notes (Signed)
BP 115/70 mmHg  Pulse 83  Temp(Src) 98.5 F (36.9 C)  Wt 192 lb (87.091 kg)  SpO2 97%   Subjective:    Patient ID: Jesus Guerrero, male    DOB: [birth year], 45 y.o.   MRN: [id number]  HPI: Jesus Guerrero is a 45 y.o. male  Chief Complaint  Patient presents with  . Foot Pain    Right, patient states that it is better   FOOT PAIN Duration: weeks Involved foot: right Mechanism of injury: unknown Location: outside Onset: gradual  Severity: 1-2/10  Quality:  aching Frequency: a few times a year Radiation: no Aggravating factors: weight bearing and walking  Alleviating factors: naproxen   Status: better Treatments attempted: ibuprofen, naproxen  Relief with NSAIDs?: yes Weakness with weight bearing or walking: yes Morning stiffness: yes Swelling: no Redness: no Bruising: no Paresthesias / decreased sensation: no  Fevers:no  Eczema is coming back. Would like something for it.  Got bit by a bug on the top of his L foot and it's itching a whole lot  Relevant past medical, surgical, family and social history reviewed and updated as indicated. Interim medical history since our last visit reviewed. Allergies and medications reviewed and updated.  Review of Systems  Constitutional: Negative.   Respiratory: Negative.   Cardiovascular: Negative.   Musculoskeletal: Positive for joint swelling, arthralgias and gait problem. Negative for myalgias, back pain, neck pain and neck stiffness.  Psychiatric/Behavioral: Negative.     Per HPI unless specifically indicated above     Objective:    BP 115/70 mmHg  Pulse 83  Temp(Src) 98.5 F (36.9 C)  Wt 192 lb (87.091 kg)  SpO2 97%  Wt Readings from Last 3 Encounters:  06/06/16 192 lb (87.091 kg)  05/23/16 189 lb (85.73 kg)  05/12/16 185 lb (83.915 kg)    Physical Exam  Constitutional: He is oriented to person, place, and time. He appears well-developed and well-nourished. No distress.  HENT:  Head: Normocephalic  and atraumatic.  Right Ear: Hearing normal.  Left Ear: Hearing normal.  Nose: Nose normal.  Eyes: Conjunctivae and lids are normal. Right eye exhibits no discharge. Left eye exhibits no discharge. No scleral icterus.  Pulmonary/Chest: Effort normal. No respiratory distress.  Musculoskeletal: Normal range of motion.  Trace edema R ankle, no redness, no heat  Neurological: He is alert and oriented to person, place, and time.  Skin: Skin is warm, dry and intact. Rash (eczema patches on abdomen and buttocks) noted. No erythema. No pallor.  insect bite on top of L foot  Psychiatric: He has a normal mood and affect. His speech is normal and behavior is normal. Judgment and thought content normal. Cognition and memory are normal.  Nursing note and vitals reviewed.   Results for orders placed or performed in visit on 05/23/16  Uric acid  Result Value Ref Range   Uric Acid 8.5 3.7 - 8.6 mg/dL  Comprehensive metabolic panel  Result Value Ref Range   Glucose 80 65 - 99 mg/dL   BUN 25 (H) 6 - 24 mg/dL   Creatinine, Ser 1.26 0.76 - 1.27 mg/dL   GFR calc non Af Amer 68 >59 mL/min/1.73   GFR calc Af Amer 79 >59 mL/min/1.73   BUN/Creatinine Ratio 20 9 - 20   Sodium 141 134 - 144 mmol/L   Potassium 5.0 3.5 - 5.2 mmol/L   Chloride 100 96 - 106 mmol/L   CO2 24 18 - 29 mmol/L   Calcium 9.1 8.7 -  10.2 mg/dL   Total Protein 7.1 6.0 - 8.5 g/dL   Albumin 4.2 3.5 - 5.5 g/dL   Globulin, Total 2.9 1.5 - 4.5 g/dL   Albumin/Globulin Ratio 1.4 1.2 - 2.2   Bilirubin Total 0.4 0.0 - 1.2 mg/dL   Alkaline Phosphatase 67 39 - 117 IU/L   AST 15 0 - 40 IU/L   ALT 14 0 - 44 IU/L  CBC with Differential/Platelet  Result Value Ref Range   WBC 10.1 3.4 - 10.8 x10E3/uL   RBC 4.66 4.14 - 5.80 x10E6/uL   Hemoglobin 13.8 12.6 - 17.7 g/dL   Hematocrit 40.0 37.5 - 51.0 %   MCV 86 79 - 97 fL   MCH 29.6 26.6 - 33.0 pg   MCHC 34.5 31.5 - 35.7 g/dL   RDW 13.9 12.3 - 15.4 %   Platelets 277 150 - 379 x10E3/uL    Neutrophils 75 %   Lymphs 16 %   Monocytes 7 %   Eos 2 %   Basos 0 %   Neutrophils Absolute 7.5 (H) 1.4 - 7.0 x10E3/uL   Lymphocytes Absolute 1.6 0.7 - 3.1 x10E3/uL   Monocytes Absolute 0.7 0.1 - 0.9 x10E3/uL   EOS (ABSOLUTE) 0.2 0.0 - 0.4 x10E3/uL   Basophils Absolute 0.0 0.0 - 0.2 x10E3/uL   Immature Granulocytes 0 %   Immature Grans (Abs) 0.0 0.0 - 0.1 x10E3/uL      Assessment & Plan:   Problem List Items Addressed This Visit      Musculoskeletal and Integument   Eczema    Refill of triamcinalone given. Call if not getting better or getting worse.       Other Visit Diagnoses    Right foot pain    -  Primary    Better. Continue current regimen. Continue to monitor.     Peripheral edema        Mild, likely due to heat and dependent position. Rx for compression stockings given. Call if not getting better.     Bug bite        Will give triamcinalone to help. Call if not getting better.        Follow up plan: Return As scheduled.

## 2016-06-25 ENCOUNTER — Ambulatory Visit (INDEPENDENT_AMBULATORY_CARE_PROVIDER_SITE_OTHER): Payer: Medicaid Other | Admitting: Podiatry

## 2016-06-25 ENCOUNTER — Encounter: Payer: Self-pay | Admitting: Podiatry

## 2016-06-25 DIAGNOSIS — M7752 Other enthesopathy of left foot: Secondary | ICD-10-CM | POA: Diagnosis not present

## 2016-06-25 DIAGNOSIS — M779 Enthesopathy, unspecified: Principal | ICD-10-CM

## 2016-06-25 DIAGNOSIS — M7751 Other enthesopathy of right foot: Secondary | ICD-10-CM

## 2016-06-25 DIAGNOSIS — M778 Other enthesopathies, not elsewhere classified: Secondary | ICD-10-CM

## 2016-06-25 NOTE — Progress Notes (Signed)
He presents today after visit to the primary care doctor for his right ankle. Blood work was performed and he had a spider bite to his left foot. Blood work suggests gout to the right foot. He also states that his big toe on his left foot is feeling better but is not well yet. He continues to take naproxen provided by the PCP.  Objective: Vital signs are stable he is alert and oriented 3 pulses are palpable. Mild edema to the right foot pain directly overlying the sinus tarsi of the right foot. The foot is warm to touch and does not demonstrate any overt erythema cellulitis or odor. No open lesions or wounds. Left foot does demonstrate small area of erythema just the base of the third toe indicative of an insect bite. There is no necrosis no cellulitis. He has tenderness on range of motion of the first metatarsophalangeal joint left foot but much better than previously noted.  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 Insect bite left foot.  Plan: Continue triamcinolone cream dorsal aspect of the left foot as long as it is not infected. We will consider an injection to the first metatarsophalangeal joint of the left foot next visit I also injected his right subtalar joint today through the sinus tarsi.

## 2016-07-04 ENCOUNTER — Ambulatory Visit (INDEPENDENT_AMBULATORY_CARE_PROVIDER_SITE_OTHER): Payer: Medicaid Other | Admitting: Family Medicine

## 2016-07-04 ENCOUNTER — Encounter: Payer: Self-pay | Admitting: Family Medicine

## 2016-07-04 VITALS — BP 123/73 | HR 96 | Temp 98.0°F | Wt 192.0 lb

## 2016-07-04 DIAGNOSIS — T7411XA Adult physical abuse, confirmed, initial encounter: Secondary | ICD-10-CM | POA: Insufficient documentation

## 2016-07-04 DIAGNOSIS — W57XXXA Bitten or stung by nonvenomous insect and other nonvenomous arthropods, initial encounter: Secondary | ICD-10-CM

## 2016-07-04 DIAGNOSIS — T148 Other injury of unspecified body region: Secondary | ICD-10-CM | POA: Diagnosis not present

## 2016-07-04 HISTORY — DX: Adult physical abuse, confirmed, initial encounter: T74.11XA

## 2016-07-04 MED ORDER — HYDROXYZINE HCL 10 MG PO TABS: 10.0000 mg | ORAL_TABLET | Freq: Every evening | ORAL | Status: DC | PRN

## 2016-07-04 MED ORDER — TRIAMCINOLONE ACETONIDE 0.5 % EX OINT: 1.0000 "application " | TOPICAL_OINTMENT | Freq: Two times a day (BID) | CUTANEOUS | Status: DC
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
## 2016-07-23 ENCOUNTER — Ambulatory Visit: Payer: Medicaid Other | Admitting: Podiatry

## 2016-07-30 ENCOUNTER — Ambulatory Visit: Payer: Medicaid Other | Admitting: Podiatry

## 2016-08-01 ENCOUNTER — Ambulatory Visit (INDEPENDENT_AMBULATORY_CARE_PROVIDER_SITE_OTHER): Payer: Medicaid Other | Admitting: Podiatry

## 2016-08-01 ENCOUNTER — Telehealth: Payer: Self-pay

## 2016-08-01 ENCOUNTER — Encounter: Payer: Self-pay | Admitting: Podiatry

## 2016-08-01 DIAGNOSIS — M722 Plantar fascial fibromatosis: Secondary | ICD-10-CM

## 2016-08-01 DIAGNOSIS — B351 Tinea unguium: Secondary | ICD-10-CM | POA: Diagnosis not present

## 2016-08-01 DIAGNOSIS — M779 Enthesopathy, unspecified: Principal | ICD-10-CM

## 2016-08-01 DIAGNOSIS — M79676 Pain in unspecified toe(s): Secondary | ICD-10-CM | POA: Diagnosis not present

## 2016-08-01 DIAGNOSIS — M778 Other enthesopathies, not elsewhere classified: Secondary | ICD-10-CM

## 2016-08-01 NOTE — Progress Notes (Signed)
He presents today for follow-up of his left foot he states that he seems to be doing better as he refers to the subtalar joint left foot which is still painful to some degree as well as the hallux limitus and capsulitis of the first metatarsophalangeal joint left foot.  Objective: Pulses remain palpable left subtalar joint capsulitis with pain on palpation of the sinus tarsi and range of motion of the subtalar joint. I also noticed pain on range of motion of the first metatarsophalangeal joint and limitation.  Assessment: Capsulitis subtalar joint and first metatarsophalangeal joint left.  Plan: Injected those areas today with Kenalog and local anesthetic follow-up with him in 6 weeks.

## 2016-08-01 NOTE — Telephone Encounter (Signed)
error 

## 2016-08-02 ENCOUNTER — Ambulatory Visit (INDEPENDENT_AMBULATORY_CARE_PROVIDER_SITE_OTHER): Payer: Medicaid Other | Admitting: Family Medicine

## 2016-08-02 ENCOUNTER — Encounter: Payer: Self-pay | Admitting: Family Medicine

## 2016-08-02 VITALS — BP 128/74 | HR 82 | Temp 98.2°F | Wt 192.0 lb

## 2016-08-02 DIAGNOSIS — L309 Dermatitis, unspecified: Secondary | ICD-10-CM

## 2016-08-02 DIAGNOSIS — M25521 Pain in right elbow: Secondary | ICD-10-CM

## 2016-08-02 DIAGNOSIS — L03312 Cellulitis of back [any part except buttock]: Secondary | ICD-10-CM

## 2016-08-02 MED ORDER — DOXYCYCLINE HYCLATE 100 MG PO TABS: 100.0000 mg | ORAL_TABLET | Freq: Two times a day (BID) | ORAL | 0 refills | Status: DC

## 2016-08-02 MED ORDER — HYDROXYZINE HCL 10 MG PO TABS: 10.0000 mg | ORAL_TABLET | Freq: Every evening | ORAL | 6 refills | Status: DC | PRN

## 2016-08-02 MED ORDER — TRIAMCINOLONE ACETONIDE 0.5 % EX OINT
1.0000 | TOPICAL_OINTMENT | Freq: Two times a day (BID) | CUTANEOUS | 0 refills | Status: DC
Start: 2016-08-02 — End: 2016-09-17

## 2016-08-02 NOTE — Assessment & Plan Note (Signed)
Refill of hie trimacinalone given. Call if not getting better or getting worse.

## 2016-08-02 NOTE — Patient Instructions (Addendum)
Generic Elbow Exercises EXERCISES RANGE OF MOTION (ROM) AND STRETCHING EXERCISES  These exercises may help you when beginning to rehabilitate your injury. Your symptoms may go away with or without further involvement from your physician, physical therapist or athletic trainer. While completing these exercises, remember:   Restoring tissue flexibility helps normal motion to return to the joints. This allows healthier, less painful movement and activity.  An effective stretch should be held for at least 30 seconds.  A stretch should never be painful. You should only feel a gentle lengthening or release in the stretched tissue. RANGE OF MOTION - Extension  Hold your right / left arm at your side and straighten your elbow as far as you can, using your right / left arm muscles.  Straighten the elbow farther by gently pushing down on your forearm until you feel a gentle stretch on the inside of your elbow. Hold this position for __________ seconds.  Slowly return to the starting position. Repeat __________ times. Complete this exercise __________ times per day.  RANGE OF MOTION - Flexion  Hold your right / left arm at your side and bend your elbow as far as you can using your arm muscles.  Bend the right / left elbow farther by gently pushing up on your forearm until you feel a gentle stretch on the outside of your elbow. Hold this position for __________ seconds.  Slowly return to the starting position. Repeat __________ times. Complete this exercise __________ times per day.  RANGE OF MOTION - Supination, Active  Stand or sit with your elbows at your side. Bend your right / left elbow to 90 degrees.  Turn your palm upward until you feel a gentle stretch on the inside of your forearm.  Hold this position for __________ seconds. Slowly release and return to the starting position. Repeat __________ times. Complete this stretch __________ times per day.  RANGE OF MOTION - Pronation,  Active  Stand or sit with your elbows at your side. Bend your right / left elbow to 90 degrees.  Turn your palm downward until you feel a gentle stretch on the top of your forearm.  Hold this position for __________ seconds. Slowly release and return to the starting position. Repeat __________ times. Complete this stretch __________ times per day.  STRETCH - Elbow Flexors  Lie on a firm bed or countertop, on your back. Be sure that you are in a comfortable position which will allow you to relax your arm muscles.  Place a folded towel under your right / left upper arm, so that your elbow and shoulder are at the same height. Extend your arm; your elbow should not rest on the bed or towel  Allow the weight of your hand to straighten your elbow. Keep your arm and chest muscles relaxed. Your caregiver may ask you to increase the intensity of your stretch by adding a small wrist or hand weight.  Hold for __________ seconds. You should feel a stretch on the inside of your elbow. Slowly return to the starting position. Repeat __________ times. Complete this exercise __________ times per day. STRENGTHENING EXERCISES  These exercises may help you when beginning to rehabilitate your injury. They may resolve your symptoms with or without further involvement from your physician, physical therapist or athletic trainer. While completing these exercises, remember:   Muscles can gain both the endurance and the strength needed for everyday activities through controlled exercises.  Complete these exercises as instructed by your physician, physical therapist or  Product/process development scientist. Increase the resistance and repetitions only as guided.  You may experience muscle soreness or fatigue, but the pain or discomfort you are trying to eliminate should never worsen during these exercises. If this pain does get worse, stop and make sure you are following the directions exactly. If the pain is still present after  adjustments, discontinue the exercise until you can discuss the trouble with your caregiver. STRENGTH - Elbow Flexors, Isometric   Stand or sit upright on a firm surface. Place your right / left arm so that your hand is palm-up and at the height of your waist.  Place your opposite hand on top of your forearm. Gently push down as your right / left arm resists. Push as hard as you can with both arms without causing any pain or movement at your right / left elbow. Hold this stationary position for __________ seconds.  Gradually release the tension in both arms. Allow your muscles to relax completely before repeating. Repeat __________ times. Complete this exercise __________ times per day. STRENGTH - Elbow Extensors, Isometric  Stand or sit upright on a firm surface. Place your right / left arm so that your palm faces your abdomen and it is at the height of your waist.  Place your opposite hand on the underside of your forearm. Gently push up as your right / left arm resists. Push as hard as you can with both arms without causing any pain or movement at your right / left elbow. Hold this stationary position for __________ seconds.  Gradually release the tension in both arms. Allow your muscles to relax completely before repeating. Repeat __________ times. Complete this exercise __________ times per day. STRENGTH - Elbow Flexors, Supinated  With good posture, stand, or sit on a firm chair without armrests. Allow your right / left arm to rest at your side with your palm facing forward.  Holding a __________ weight, or gripping a rubber exercise band or tubing, bring your hand toward your shoulder.  Allow your muscles to control the resistance, as your hand returns to your side. Repeat __________ times. Complete this exercise __________ times per day.  STRENGTH - Elbow Extensors, Dynamic  With good posture, stand, or sit on a firm chair without armrests. Keeping your upper arms at your side,  bring both hands up toward your right / left shoulder while gripping a rubber exercise band or tubing. Your right / left hand should be just below the other hand.  Straighten your right / left elbow. Hold for __________ seconds.  Allow your muscles to control the rubber exercise band, as your hand returns to your shoulder. Repeat __________ times. Complete this exercise __________ times per day. STRENGTH - Forearm Supinators   Sit with your right / left forearm supported on a table, keeping your elbow below shoulder height. Rest your hand over the edge, palm down.  Gently grip a hammer or a soup ladle.  Without moving your elbow, slowly turn your palm and hand upward to a "thumbs-up" position.  Hold this position for __________ seconds. Slowly return to the starting position. Repeat __________ times. Complete this exercise __________ times per day.  STRENGTH - Forearm Pronators  Sit with your right / left forearm supported on a table, keeping your elbow below shoulder height. Rest your hand over the edge, palm up.  Gently grip a hammer or a soup ladle.  Without moving your elbow, slowly turn your palm and hand upward to a "thumbs-up" position.  Hold  this position for __________ seconds. Slowly return to the starting position. Repeat __________ times. Complete this exercise __________ times per day.   This information is not intended to replace advice given to you by your health care provider. Make sure you discuss any questions you have with your health care provider.   Document Released: 10/31/2005 Document Revised: 01/07/2015 Document Reviewed: 03/31/2009 Elsevier Interactive Patient Education 2016 [employer] Eczema Eczema, also called atopic dermatitis, is a skin disorder that causes inflammation of the skin. It causes a red rash and dry, scaly skin. The skin becomes very itchy. Eczema is generally worse during the cooler winter months and often improves with the warmth of  summer. Eczema usually starts showing signs in infancy. Some children outgrow eczema, but it may last through adulthood.  CAUSES  The exact cause of eczema is not known, but it appears to run in families. People with eczema often have a family history of eczema, allergies, asthma, or hay fever. Eczema is not contagious. Flare-ups of the condition may be caused by:   Contact with something you are sensitive or allergic to.   Stress. SIGNS AND SYMPTOMS  Dry, scaly skin.   Red, itchy rash.   Itchiness. This may occur before the skin rash and may be very intense.  DIAGNOSIS  The diagnosis of eczema is usually made based on symptoms and medical history. TREATMENT  Eczema cannot be cured, but symptoms usually can be controlled with treatment and other strategies. A treatment plan might include:  Controlling the itching and scratching.   Use over-the-counter antihistamines as directed for itching. This is especially useful at night when the itching tends to be worse.   Use over-the-counter steroid creams as directed for itching.   Avoid scratching. Scratching makes the rash and itching worse. It may also result in a skin infection (impetigo) due to a break in the skin caused by scratching.   Keeping the skin well moisturized with creams every day. This will seal in moisture and help prevent dryness. Lotions that contain alcohol and water should be avoided because they can dry the skin.   Limiting exposure to things that you are sensitive or allergic to (allergens).   Recognizing situations that cause stress.   Developing a plan to manage stress.  HOME CARE INSTRUCTIONS   Only take over-the-counter or prescription medicines as directed by your health care provider.   Do not use anything on the skin without checking with your health care provider.   Keep baths or showers short (5 minutes) in warm (not hot) water. Use mild cleansers for bathing. These should be unscented.  You may add nonperfumed bath oil to the bath water. It is best to avoid soap and bubble bath.   Immediately after a bath or shower, when the skin is still damp, apply a moisturizing ointment to the entire body. This ointment should be a petroleum ointment. This will seal in moisture and help prevent dryness. The thicker the ointment, the better. These should be unscented.   Keep fingernails cut short. Children with eczema may need to wear soft gloves or mittens at night after applying an ointment.   Dress in clothes made of cotton or cotton blends. Dress lightly, because heat increases itching.   A child with eczema should stay away from anyone with fever blisters or cold sores. The virus that causes fever blisters (herpes simplex) can cause a serious skin infection in children with eczema. SEEK MEDICAL CARE IF:   Your itching  interferes with sleep.   Your rash gets worse or is not better within 1 week after starting treatment.   You see pus or soft yellow scabs in the rash area.   You have a fever.   You have a rash flare-up after contact with someone who has fever blisters.    This information is not intended to replace advice given to you by your health care provider. Make sure you discuss any questions you have with your health care provider.   Document Released: 12/14/2000 Document Revised: 10/07/2013 Document Reviewed: 07/20/2013 Elsevier Interactive Patient Education [employer].
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
## 2016-08-17 ENCOUNTER — Telehealth: Payer: Self-pay | Admitting: Family Medicine

## 2016-08-17 NOTE — Telephone Encounter (Signed)
Pt needs refill on triamcinolone ointment (KENALOG) 0.5 % sent to [facility]

## 2016-08-17 NOTE — Telephone Encounter (Signed)
Spoke with pastor, he will speak with Angelica Chessman to make sure that he has not picked the prescription up that was sent on 08/02/16.

## 2016-09-05 ENCOUNTER — Ambulatory Visit (INDEPENDENT_AMBULATORY_CARE_PROVIDER_SITE_OTHER): Payer: Medicaid Other | Admitting: Podiatry

## 2016-09-05 ENCOUNTER — Encounter: Payer: Self-pay | Admitting: Podiatry

## 2016-09-05 DIAGNOSIS — M7752 Other enthesopathy of left foot: Secondary | ICD-10-CM | POA: Diagnosis not present

## 2016-09-05 DIAGNOSIS — M778 Other enthesopathies, not elsewhere classified: Secondary | ICD-10-CM

## 2016-09-05 DIAGNOSIS — M779 Enthesopathy, unspecified: Principal | ICD-10-CM

## 2016-09-05 DIAGNOSIS — B353 Tinea pedis: Secondary | ICD-10-CM

## 2016-09-05 MED ORDER — KETOCONAZOLE 2 % EX CREA: 1.0000 "application " | TOPICAL_CREAM | Freq: Two times a day (BID) | CUTANEOUS | 3 refills | Status: DC

## 2016-09-05 NOTE — Progress Notes (Signed)
Jesus Guerrero presents today states that his foot is feeling much better he would like to have one more cortisone injection as he refers to the first metatarsophalangeal joint. He does go on to state that his subtalar joint of his left foot has been tender recently.  Objective: Vital signs are stable alert and oriented 3. Pulses are palpable. He has pain on palpation of the sinus tarsi left foot. He has some tenderness on palpation of the first metatarsophalangeal joint but much decreased from previous evaluation.  Assessment: Capsulitis subtalar joint left foot. Capsulitis first metatarsophalangeal joint left foot.  Plan: I injected the area today both joints with Kenalog and local anesthetic follow-up with him in 2 months.

## 2016-09-17 ENCOUNTER — Ambulatory Visit (INDEPENDENT_AMBULATORY_CARE_PROVIDER_SITE_OTHER): Payer: Medicaid Other | Admitting: Family Medicine

## 2016-09-17 ENCOUNTER — Encounter: Payer: Self-pay | Admitting: Family Medicine

## 2016-09-17 VITALS — BP 120/75 | HR 92 | Temp 98.6°F | Wt 190.0 lb

## 2016-09-17 DIAGNOSIS — R112 Nausea with vomiting, unspecified: Secondary | ICD-10-CM

## 2016-09-17 MED ORDER — ONDANSETRON 4 MG PO TBDP: 4.0000 mg | ORAL_TABLET | Freq: Three times a day (TID) | ORAL | 0 refills | Status: DC | PRN

## 2016-09-17 NOTE — Patient Instructions (Signed)
Follow up as needed

## 2016-09-17 NOTE — Progress Notes (Signed)
   BP 120/75   Pulse 92   Temp 98.6 F (37 C)   Wt 190 lb (86.2 kg)   SpO2 97%   BMI 32.61 kg/m    Subjective:    Patient ID: Jesus Guerrero, male    DOB: [birth year], 45 y.o.   MRN: [id number]  HPI: Jesus Guerrero is a 44 y.o. male  Chief Complaint  Patient presents with  . Nausea    nausea and vomiting started this morning. Last vomited around 9:30am, but still feels queasy. No diarrhea, no fever.    Woke up with nausea this morning, vomited several times. Last time was about 9:30 am. No appetite. Has not had fever, but some chills. Denies sick contacts, abdominal pain, or diarrhea. Has not tried eating anything yet today or taking anything.   Relevant past medical, surgical, family and social history reviewed and updated as indicated. Interim medical history since our last visit reviewed. Allergies and medications reviewed and updated.  Review of Systems  Constitutional: Positive for chills.  HENT: Negative.   Respiratory: Negative.   Cardiovascular: Negative.   Gastrointestinal: Positive for nausea and vomiting.  Genitourinary: Negative.   Musculoskeletal: Negative.   Skin: Negative.   Neurological: Negative.   Psychiatric/Behavioral: Negative.     Per HPI unless specifically indicated above     Objective:    BP 120/75   Pulse 92   Temp 98.6 F (37 C)   Wt 190 lb (86.2 kg)   SpO2 97%   BMI 32.61 kg/m   Wt Readings from Last 3 Encounters:  09/17/16 190 lb (86.2 kg)  08/02/16 192 lb (87.1 kg)  07/04/16 192 lb (87.1 kg)    Physical Exam  Constitutional: He is oriented to person, place, and time. He appears well-developed and well-nourished. No distress.  HENT:  Head: Atraumatic.  Eyes: Conjunctivae are normal. No scleral icterus.  Neck: Normal range of motion. Neck supple.  Cardiovascular: Normal rate, regular rhythm and normal heart sounds.   Pulmonary/Chest: Effort normal and breath sounds normal. No respiratory distress.  Abdominal: Soft. Bowel  sounds are normal. He exhibits no distension and no mass. There is no tenderness. There is no rebound and no guarding.  Musculoskeletal: Normal range of motion.  Neurological: He is alert and oriented to person, place, and time.  Skin: Skin is warm and dry.  Psychiatric: He has a normal mood and affect. His behavior is normal.  Nursing note and vitals reviewed.     Assessment & Plan:   Problem List Items Addressed This Visit    None    Visit Diagnoses    Non-intractable vomiting with nausea, vomiting of unspecified type    -  Primary   Zofran sent, recommended gatorade or pedialyte and BRAT diet, slowly advance as tolerated. Follow up in 2 -3 days if no improvement       Follow up plan: Return if symptoms worsen or fail to improve.

## 2016-09-21 ENCOUNTER — Ambulatory Visit (INDEPENDENT_AMBULATORY_CARE_PROVIDER_SITE_OTHER): Payer: Medicaid Other | Admitting: Family Medicine

## 2016-09-21 ENCOUNTER — Encounter: Payer: Self-pay | Admitting: Family Medicine

## 2016-09-21 VITALS — BP 120/75 | HR 92 | Temp 98.2°F | Wt 188.0 lb

## 2016-09-21 DIAGNOSIS — R112 Nausea with vomiting, unspecified: Secondary | ICD-10-CM

## 2016-09-21 DIAGNOSIS — R1030 Lower abdominal pain, unspecified: Secondary | ICD-10-CM

## 2016-09-21 DIAGNOSIS — Z23 Encounter for immunization: Secondary | ICD-10-CM | POA: Diagnosis not present

## 2016-09-21 LAB — CBC WITH DIFFERENTIAL/PLATELET
Hematocrit: 46.5 % (ref 37.5–51.0)
Hemoglobin: 15.4 g/dL (ref 12.6–17.7)
Lymphocytes Absolute: 1.3 10*3/uL (ref 0.7–3.1)
Lymphs: 12 %
MCH: 30 pg (ref 26.6–33.0)
MCHC: 33.1 g/dL (ref 31.5–35.7)
MCV: 91 fL (ref 79–97)
MID (Absolute): 0.9 10*3/uL (ref 0.1–1.6)
MID: 8 %
NEUTROS ABS: 8.2 10*3/uL — AB (ref 1.4–7.0)
Neutrophils: 79 %
PLATELETS: 255 10*3/uL (ref 150–379)
RBC: 5.13 x10E6/uL (ref 4.14–5.80)
RDW: 13.9 % (ref 12.3–15.4)
WBC: 10.4 10*3/uL (ref 3.4–10.8)

## 2016-09-21 LAB — UA/M W/RFLX CULTURE, ROUTINE
BILIRUBIN UA: NEGATIVE
GLUCOSE, UA: NEGATIVE
Leukocytes, UA: NEGATIVE
NITRITE UA: NEGATIVE
SPEC GRAV UA: 1.015 (ref 1.005–1.030)
UUROB: 0.2 mg/dL (ref 0.2–1.0)
pH, UA: 5.5 (ref 5.0–7.5)

## 2016-09-21 LAB — MICROSCOPIC EXAMINATION: Epithelial Cells (non renal): NONE SEEN /hpf (ref 0–10)

## 2016-09-21 NOTE — Patient Instructions (Addendum)
Influenza (Flu) Vaccine (Inactivated or Recombinant):  1. Why get vaccinated? Influenza ("flu") is a contagious disease that spreads around the [place] every year, usually between October and May. Flu is caused by influenza viruses, and is spread mainly by coughing, sneezing, and close contact. Anyone can get flu. Flu strikes suddenly and can last several days. Symptoms vary by age, but can include:  fever/chills  sore throat  muscle aches  fatigue  cough  headache  runny or stuffy nose Flu can also lead to pneumonia and blood infections, and cause diarrhea and seizures in children. If you have a medical condition, such as heart or lung disease, flu can make it worse. Flu is more dangerous for some people. Infants and young children, people 65 years of age and older, pregnant women, and people with certain health conditions or a weakened immune system are at greatest risk. Each year thousands of people in the [place] die from flu, and many more are hospitalized. Flu vaccine can:  keep you from getting flu,  make flu less severe if you do get it, and  keep you from spreading flu to your family and other people. 2. Inactivated and recombinant flu vaccines A dose of flu vaccine is recommended every flu season. Children 6 months through 8 years of age may need two doses during the same flu season. Everyone else needs only one dose each flu season. Some inactivated flu vaccines contain a very small amount of a mercury-based preservative called thimerosal. Studies have not shown thimerosal in vaccines to be harmful, but flu vaccines that do not contain thimerosal are available. There is no live flu virus in flu shots. They cannot cause the flu. There are many flu viruses, and they are always changing. Each year a new flu vaccine is made to protect against three or four viruses that are likely to cause disease in the upcoming flu season. But even when the vaccine doesn't exactly  match these viruses, it may still provide some protection. Flu vaccine cannot prevent:  flu that is caused by a virus not covered by the vaccine, or  illnesses that look like flu but are not. It takes about 2 weeks for protection to develop after vaccination, and protection lasts through the flu season. 3. Some people should not get this vaccine Tell the person who is giving you the vaccine:  If you have any severe, life-threatening allergies. If you ever had a life-threatening allergic reaction after a dose of flu vaccine, or have a severe allergy to any part of this vaccine, you may be advised not to get vaccinated. Most, but not all, types of flu vaccine contain a small amount of egg protein.  If you ever had Guillain-Barre Syndrome (also called GBS). Some people with a history of GBS should not get this vaccine. This should be discussed with your doctor.  If you are not feeling well. It is usually okay to get flu vaccine when you have a mild illness, but you might be asked to come back when you feel better. 4. Risks of a vaccine reaction With any medicine, including vaccines, there is a chance of reactions. These are usually mild and go away on their own, but serious reactions are also possible. Most people who get a flu shot do not have any problems with it. Minor problems following a flu shot include:  soreness, redness, or swelling where the shot was given  hoarseness  sore, red or itchy eyes  cough    fever  aches  headache  itching  fatigue If these problems occur, they usually begin soon after the shot and last 1 or 2 days. More serious problems following a flu shot can include the following:  There may be a small increased risk of Guillain-Barre Syndrome (GBS) after inactivated flu vaccine. This risk has been estimated at 1 or 2 additional cases per million people vaccinated. This is much lower than the risk of severe complications from flu, which can be prevented by  flu vaccine.  Young children who get the flu shot along with pneumococcal vaccine (PCV13) and/or DTaP vaccine at the same time might be slightly more likely to have a seizure caused by fever. Ask your doctor for more information. Tell your doctor if a child who is getting flu vaccine has ever had a seizure. Problems that could happen after any injected vaccine:  People sometimes faint after a medical procedure, including vaccination. Sitting or lying down for about 15 minutes can help prevent fainting, and injuries caused by a fall. Tell your doctor if you feel dizzy, or have vision changes or ringing in the ears.  Some people get severe pain in the shoulder and have difficulty moving the arm where a shot was given. This happens very rarely.  Any medication can cause a severe allergic reaction. Such reactions from a vaccine are very rare, estimated at about 1 in a million doses, and would happen within a few minutes to a few hours after the vaccination. As with any medicine, there is a very remote chance of a vaccine causing a serious injury or death. The safety of vaccines is always being monitored. For more information, visit: www.cdc.gov/vaccinesafety/ 5. What if there is a serious reaction? What should I look for?  Look for anything that concerns you, such as signs of a severe allergic reaction, very high fever, or unusual behavior. Signs of a severe allergic reaction can include hives, swelling of the face and throat, difficulty breathing, a fast heartbeat, dizziness, and weakness. These would start a few minutes to a few hours after the vaccination. What should I do?  If you think it is a severe allergic reaction or other emergency that can't wait, call 9-1-1 and get the person to the nearest hospital. Otherwise, call your doctor.  Reactions should be reported to the Vaccine Adverse Event Reporting System (VAERS). Your doctor should file this report, or you can do it yourself through the  VAERS web site at www.vaers.hhs.gov, or by calling 1-800-822-7967. VAERS does not give medical advice. 6. The National Vaccine Injury Compensation Program The National Vaccine Injury Compensation Program (VICP) is a federal program that was created to compensate people who may have been injured by certain vaccines. Persons who believe they may have been injured by a vaccine can learn about the program and about filing a claim by calling 1-800-338-2382 or visiting the VICP website at www.hrsa.gov/vaccinecompensation. There is a time limit to file a claim for compensation. 7. How can I learn more?  Ask your healthcare provider. He or she can give you the vaccine package insert or suggest other sources of information.  Call your local or state health department.  Contact the Centers for Disease Control and Prevention (CDC):  Call 1-800-232-4636 (1-800-CDC-INFO) or  Visit CDC's website at www.cdc.gov/flu Vaccine Information Statement Inactivated Influenza Vaccine (08/06/2014)   This information is not intended to replace advice given to you by your health care provider. Make sure you discuss any questions you have with   your health care provider.   Document Released: 10/11/2006 Document Revised: 01/07/2015 Document Reviewed: 08/09/2014 Elsevier Interactive Patient Education 2016 Elsevier Inc.  Viral Gastroenteritis Viral gastroenteritis is also called stomach flu. This illness is caused by a certain type of germ (virus). It can cause sudden watery poop (diarrhea) and throwing up (vomiting). This can cause you to lose body fluids (dehydration). This illness usually lasts for 3 to 8 days. It usually goes away on its own. HOME CARE   Drink enough fluids to keep your pee (urine) clear or pale yellow. Drink small amounts of fluids often.  Ask your doctor how to replace body fluid losses (rehydration).  Avoid:  Foods high in sugar.  Alcohol.  Bubbly (carbonated)  drinks.  Tobacco.  Juice.  Caffeine drinks.  Very hot or cold fluids.  Fatty, greasy foods.  Eating too much at one time.  Dairy products until 24 to 48 hours after your watery poop stops.  You may eat foods with active cultures (probiotics). They can be found in some yogurts and supplements.  Wash your hands well to avoid spreading the illness.  Only take medicines as told by your doctor. Do not give aspirin to children. Do not take medicines for watery poop (antidiarrheals).  Ask your doctor if you should keep taking your regular medicines.  Keep all doctor visits as told. GET HELP RIGHT AWAY IF:   You cannot keep fluids down.  You do not pee at least once every 6 to 8 hours.  You are short of breath.  You see blood in your poop or throw up. This may look like coffee grounds.  You have belly (abdominal) pain that gets worse or is just in one small spot (localized).  You keep throwing up or having watery poop.  You have a fever.  The patient is a child younger than 3 months, and he or she has a fever.  The patient is a child older than 3 months, and he or she has a fever and problems that do not go away.  The patient is a child older than 3 months, and he or she has a fever and problems that suddenly get worse.  The patient is a baby, and he or she has no tears when crying. MAKE SURE YOU:   Understand these instructions.  Will watch your condition.  Will get help right away if you are not doing well or get worse.   This information is not intended to replace advice given to you by your health care provider. Make sure you discuss any questions you have with your health care provider.   Document Released: 06/04/2008 Document Revised: 03/10/2012 Document Reviewed: 10/03/2011 Elsevier Interactive Patient Education [employer].

## 2016-09-21 NOTE — Progress Notes (Signed)
BP 120/75 (BP Location: Left Arm, Patient Position: Sitting, Cuff Size: Normal)   Pulse 92   Temp 98.2 F (36.8 C)   Wt 188 lb (85.3 kg)   SpO2 96%   BMI 32.27 kg/m    Subjective:    Patient ID: Jesus Guerrero, male    DOB: [birth year], 45 y.o.   MRN: [id number]  HPI: KENDRICKS BARATZ is a 45 y.o. male  Chief Complaint  Patient presents with  . Abdominal Pain   ABDOMINAL ISSUES Duration: 1 week Nature: sharp Location: LLQ and RLQ  Severity: moderate  Radiation: no Episode duration: several hours Frequency: intermittent Alleviating factors: antacids Aggravating factors: eating Treatments attempted: antacids Constipation: yes Diarrhea: yes Episodes of diarrhea/day: every day 1-3x a day Mucous in the stool: no Heartburn: no Bloating:yes Flatulence: no Nausea: yes Vomiting: yes Episodes of vomit/day: every times he eats Melena or hematochezia: no Rash: no Jaundice: no Fever: no Weight loss: no   Relevant past medical, surgical, family and social history reviewed and updated as indicated. Interim medical history since our last visit reviewed. Allergies and medications reviewed and updated.  Review of Systems  Constitutional: Positive for unexpected weight change. Negative for activity change, appetite change, chills, diaphoresis, fatigue and fever.  Respiratory: Negative.   Cardiovascular: Negative.   Gastrointestinal: Positive for abdominal distention, abdominal pain, diarrhea and nausea. Negative for anal bleeding, blood in stool, constipation and rectal pain.  Genitourinary: Negative.   Psychiatric/Behavioral: Negative.     Per HPI unless specifically indicated above     Objective:    BP 120/75 (BP Location: Left Arm, Patient Position: Sitting, Cuff Size: Normal)   Pulse 92   Temp 98.2 F (36.8 C)   Wt 188 lb (85.3 kg)   SpO2 96%   BMI 32.27 kg/m   Wt Readings from Last 3 Encounters:  09/21/16 188 lb (85.3 kg)  09/17/16 190 lb (86.2 kg)    08/02/16 192 lb (87.1 kg)    Physical Exam  Constitutional: He is oriented to person, place, and time. He appears well-developed and well-nourished. No distress.  HENT:  Head: Normocephalic and atraumatic.  Right Ear: Hearing normal.  Left Ear: Hearing normal.  Nose: Nose normal.  Eyes: Conjunctivae and lids are normal. Right eye exhibits no discharge. Left eye exhibits no discharge. No scleral icterus.  Cardiovascular: Normal rate, regular rhythm, normal heart sounds and intact distal pulses.  Exam reveals no gallop and no friction rub.   No murmur heard. Pulmonary/Chest: Effort normal and breath sounds normal. No respiratory distress. He has no wheezes. He has no rales. He exhibits no tenderness.  Abdominal: Soft. Bowel sounds are normal. He exhibits no distension and no mass. There is tenderness (Bilateral lower quadrants). There is no rebound and no guarding.  Musculoskeletal: Normal range of motion.  Neurological: He is alert and oriented to person, place, and time.  Skin: Skin is warm, dry and intact. No rash noted. He is not diaphoretic. No erythema. No pallor.  Psychiatric: He has a normal mood and affect. His speech is normal and behavior is normal. Judgment and thought content normal. Cognition and memory are normal.  Nursing note and vitals reviewed.   Results for orders placed or performed in visit on 05/23/16  Uric acid  Result Value Ref Range   Uric Acid 8.5 3.7 - 8.6 mg/dL  Comprehensive metabolic panel  Result Value Ref Range   Glucose 80 65 - 99 mg/dL   BUN 25 (H) 6 -  24 mg/dL   Creatinine, Ser 1.26 0.76 - 1.27 mg/dL   GFR calc non Af Amer 68 >59 mL/min/1.73   GFR calc Af Amer 79 >59 mL/min/1.73   BUN/Creatinine Ratio 20 9 - 20   Sodium 141 134 - 144 mmol/L   Potassium 5.0 3.5 - 5.2 mmol/L   Chloride 100 96 - 106 mmol/L   CO2 24 18 - 29 mmol/L   Calcium 9.1 8.7 - 10.2 mg/dL   Total Protein 7.1 6.0 - 8.5 g/dL   Albumin 4.2 3.5 - 5.5 g/dL   Globulin, Total 2.9  1.5 - 4.5 g/dL   Albumin/Globulin Ratio 1.4 1.2 - 2.2   Bilirubin Total 0.4 0.0 - 1.2 mg/dL   Alkaline Phosphatase 67 39 - 117 IU/L   AST 15 0 - 40 IU/L   ALT 14 0 - 44 IU/L  CBC with Differential/Platelet  Result Value Ref Range   WBC 10.1 3.4 - 10.8 x10E3/uL   RBC 4.66 4.14 - 5.80 x10E6/uL   Hemoglobin 13.8 12.6 - 17.7 g/dL   Hematocrit 40.0 37.5 - 51.0 %   MCV 86 79 - 97 fL   MCH 29.6 26.6 - 33.0 pg   MCHC 34.5 31.5 - 35.7 g/dL   RDW 13.9 12.3 - 15.4 %   Platelets 277 150 - 379 x10E3/uL   Neutrophils 75 %   Lymphs 16 %   Monocytes 7 %   Eos 2 %   Basos 0 %   Neutrophils Absolute 7.5 (H) 1.4 - 7.0 x10E3/uL   Lymphocytes Absolute 1.6 0.7 - 3.1 x10E3/uL   Monocytes Absolute 0.7 0.1 - 0.9 x10E3/uL   EOS (ABSOLUTE) 0.2 0.0 - 0.4 x10E3/uL   Basophils Absolute 0.0 0.0 - 0.2 x10E3/uL   Immature Granulocytes 0 %   Immature Grans (Abs) 0.0 0.0 - 0.1 x10E3/uL      Assessment & Plan:   Problem List Items Addressed This Visit      Other   Abdominal pain - Primary    Lower quadrant pain. WBC normal. UA negative. CMP checked today. Likely due to viral gastroenteritis. Continue zofran. Continue gentle diet. Recheck in 3-4 days to confirm getting better.       Relevant Orders   CBC With Differential/Platelet   Comprehensive metabolic panel   UA/M w/rflx Culture, Routine    Other Visit Diagnoses    Non-intractable vomiting with nausea, vomiting of unspecified type       Will continue zofran. Checking labs. Likely viral gastroenteritis. Montior closely. Recheck 3-4 days.    Relevant Orders   CBC With Differential/Platelet   Comprehensive metabolic panel   UA/M w/rflx Culture, Routine   Immunization due       Flu shot given today.   Relevant Orders   Flu Vaccine QUAD 36+ mos PF IM (Fluarix & Fluzone Quad PF) (Completed)       Follow up plan: Return Tuesday or Wednesday, for Follow up stomach issues.

## 2016-09-21 NOTE — Assessment & Plan Note (Signed)
Lower quadrant pain. WBC normal. UA negative. CMP checked today. Likely due to viral gastroenteritis. Continue zofran. Continue gentle diet. Recheck in 3-4 days to confirm getting better.

## 2016-09-22 LAB — COMPREHENSIVE METABOLIC PANEL
ALK PHOS: 72 IU/L (ref 39–117)
ALT: 22 IU/L (ref 0–44)
AST: 22 IU/L (ref 0–40)
Albumin/Globulin Ratio: 1.4 (ref 1.2–2.2)
Albumin: 4.3 g/dL (ref 3.5–5.5)
BILIRUBIN TOTAL: 0.5 mg/dL (ref 0.0–1.2)
BUN/Creatinine Ratio: 9 (ref 9–20)
BUN: 11 mg/dL (ref 6–24)
CHLORIDE: 99 mmol/L (ref 96–106)
CO2: 25 mmol/L (ref 18–29)
Calcium: 8 mg/dL — ABNORMAL LOW (ref 8.7–10.2)
Creatinine, Ser: 1.22 mg/dL (ref 0.76–1.27)
GFR calc non Af Amer: 71 mL/min/{1.73_m2} (ref >59)
GFR, EST AFRICAN AMERICAN: 82 mL/min/{1.73_m2} (ref >59)
GLUCOSE: 107 mg/dL — AB (ref 65–99)
Globulin, Total: 3.1 g/dL (ref 1.5–4.5)
Potassium: 3.7 mmol/L (ref 3.5–5.2)
Sodium: 139 mmol/L (ref 134–144)
TOTAL PROTEIN: 7.4 g/dL (ref 6.0–8.5)

## 2016-09-24 ENCOUNTER — Telehealth: Payer: Self-pay | Admitting: Family Medicine

## 2016-09-24 NOTE — Telephone Encounter (Signed)
Please let him know that his labs were normal and I'll see him in the next couple of days. Thanks!

## 2016-09-24 NOTE — Telephone Encounter (Signed)
Patient's pastor notified.

## 2016-09-24 NOTE — Telephone Encounter (Signed)
Called, no answer, unable to leave a message, will try again.

## 2016-09-26 ENCOUNTER — Encounter: Payer: Self-pay | Admitting: Family Medicine

## 2016-09-26 ENCOUNTER — Ambulatory Visit (INDEPENDENT_AMBULATORY_CARE_PROVIDER_SITE_OTHER): Payer: Medicaid Other | Admitting: Family Medicine

## 2016-09-26 VITALS — BP 118/70 | HR 81 | Temp 98.2°F | Wt 194.0 lb

## 2016-09-26 DIAGNOSIS — R1084 Generalized abdominal pain: Secondary | ICD-10-CM | POA: Diagnosis not present

## 2016-09-26 LAB — UA/M W/RFLX CULTURE, ROUTINE
BILIRUBIN UA: NEGATIVE
Glucose, UA: NEGATIVE
KETONES UA: NEGATIVE
LEUKOCYTES UA: NEGATIVE
Nitrite, UA: NEGATIVE
PH UA: 6 (ref 5.0–7.5)
SPEC GRAV UA: 1.02 (ref 1.005–1.030)
Urobilinogen, Ur: 0.2 mg/dL (ref 0.2–1.0)

## 2016-09-26 LAB — MICROSCOPIC EXAMINATION: EPITHELIAL CELLS (NON RENAL): NONE SEEN /HPF (ref 0–10)

## 2016-09-26 NOTE — Assessment & Plan Note (Signed)
Resolved. Likely viral illness. UA negative. Call with any concerns.

## 2016-09-26 NOTE — Progress Notes (Signed)
BP 118/70 (BP Location: Left Arm, Patient Position: Sitting, Cuff Size: Normal)   Pulse 81   Temp 98.2 F (36.8 C)   Wt 194 lb (88 kg) Comment: with shoes  SpO2 97%   BMI 33.30 kg/m    Subjective:    Patient ID: Jesus Guerrero, male    DOB: 10/29/1971, 45 y.o.   MRN: [id number]  HPI: Jesus Guerrero is a 45 y.o. male  Chief Complaint  Patient presents with  . follow up abdominal pain   Feeling better abdominal pain resolved. No more vomiting. Keeping food down. No more concerns.   Relevant past medical, surgical, family and social history reviewed and updated as indicated. Interim medical history since our last visit reviewed. Allergies and medications reviewed and updated.  Review of Systems  Constitutional: Negative.   Respiratory: Negative.   Cardiovascular: Negative.   Gastrointestinal: Negative.   Psychiatric/Behavioral: Negative.     Per HPI unless specifically indicated above     Objective:    BP 118/70 (BP Location: Left Arm, Patient Position: Sitting, Cuff Size: Normal)   Pulse 81   Temp 98.2 F (36.8 C)   Wt 194 lb (88 kg) Comment: with shoes  SpO2 97%   BMI 33.30 kg/m   Wt Readings from Last 3 Encounters:  09/26/16 194 lb (88 kg)  09/21/16 188 lb (85.3 kg)  09/17/16 190 lb (86.2 kg)    Physical Exam  Constitutional: He is oriented to person, place, and time. He appears well-developed and well-nourished. No distress.  HENT:  Head: Normocephalic and atraumatic.  Right Ear: Hearing normal.  Left Ear: Hearing normal.  Nose: Nose normal.  Eyes: Conjunctivae and lids are normal. Right eye exhibits no discharge. Left eye exhibits no discharge. No scleral icterus.  Pulmonary/Chest: Effort normal. No respiratory distress.  Musculoskeletal: Normal range of motion.  Neurological: He is alert and oriented to person, place, and time.  Skin: Skin is warm, dry and intact. No rash noted. No erythema. No pallor.  Psychiatric: He has a normal mood and  affect. His speech is normal and behavior is normal. Judgment and thought content normal. Cognition and memory are normal.  Nursing note and vitals reviewed.   Results for orders placed or performed in visit on 09/21/16  Microscopic Examination  Result Value Ref Range   WBC, UA 0-5 0 - 5 /hpf   RBC, UA 3-10 (A) 0 - 2 /hpf   Epithelial Cells (non renal) None seen 0 - 10 /hpf   Mucus, UA Present Not Estab.   Bacteria, UA Few None seen/Few  CBC With Differential/Platelet  Result Value Ref Range   WBC 10.4 3.4 - 10.8 x10E3/uL   RBC 5.13 4.14 - 5.80 x10E6/uL   Hemoglobin 15.4 12.6 - 17.7 g/dL   Hematocrit 46.5 37.5 - 51.0 %   MCV 91 79 - 97 fL   MCH 30.0 26.6 - 33.0 pg   MCHC 33.1 31.5 - 35.7 g/dL   RDW 13.9 12.3 - 15.4 %   Platelets 255 150 - 379 x10E3/uL   Neutrophils 79 %   Lymphs 12 %   MID 8 %   Neutrophils Absolute 8.2 (H) 1.4 - 7.0 x10E3/uL   Lymphocytes Absolute 1.3 0.7 - 3.1 x10E3/uL   MID (Absolute) 0.9 0.1 - 1.6 X10E3/uL  Comprehensive metabolic panel  Result Value Ref Range   Glucose 107 (H) 65 - 99 mg/dL   BUN 11 6 - 24 mg/dL   Creatinine, Ser 1.22 0.76 -  1.27 mg/dL   GFR calc non Af Amer 71 >59 mL/min/1.73   GFR calc Af Amer 82 >59 mL/min/1.73   BUN/Creatinine Ratio 9 9 - 20   Sodium 139 134 - 144 mmol/L   Potassium 3.7 3.5 - 5.2 mmol/L   Chloride 99 96 - 106 mmol/L   CO2 25 18 - 29 mmol/L   Calcium 8.0 (L) 8.7 - 10.2 mg/dL   Total Protein 7.4 6.0 - 8.5 g/dL   Albumin 4.3 3.5 - 5.5 g/dL   Globulin, Total 3.1 1.5 - 4.5 g/dL   Albumin/Globulin Ratio 1.4 1.2 - 2.2   Bilirubin Total 0.5 0.0 - 1.2 mg/dL   Alkaline Phosphatase 72 39 - 117 IU/L   AST 22 0 - 40 IU/L   ALT 22 0 - 44 IU/L  UA/M w/rflx Culture, Routine  Result Value Ref Range   Specific Gravity, UA 1.015 1.005 - 1.030   pH, UA 5.5 5.0 - 7.5   Color, UA Yellow Yellow   Appearance Ur Clear Clear   Leukocytes, UA Negative Negative   Protein, UA 1+ (A) Negative/Trace   Glucose, UA Negative Negative    Ketones, UA 2+ (A) Negative   RBC, UA 3+ (A) Negative   Bilirubin, UA Negative Negative   Urobilinogen, Ur 0.2 0.2 - 1.0 mg/dL   Nitrite, UA Negative Negative   Microscopic Examination See below:       Assessment & Plan:   Problem List Items Addressed This Visit      Other   Abdominal pain - Primary    Resolved. Likely viral illness. UA negative. Call with any concerns.       Relevant Orders   UA/M w/rflx Culture, Routine    Other Visit Diagnoses   None.      Follow up plan: Return in about 2 months (around 11/26/2016) for Follow up.

## 2016-09-28 ENCOUNTER — Ambulatory Visit (INDEPENDENT_AMBULATORY_CARE_PROVIDER_SITE_OTHER): Payer: Medicaid Other | Admitting: Podiatry

## 2016-09-28 DIAGNOSIS — M659 Synovitis and tenosynovitis, unspecified: Secondary | ICD-10-CM

## 2016-09-28 DIAGNOSIS — M7752 Other enthesopathy of left foot: Secondary | ICD-10-CM

## 2016-09-28 DIAGNOSIS — M79675 Pain in left toe(s): Secondary | ICD-10-CM

## 2016-09-28 DIAGNOSIS — M25572 Pain in left ankle and joints of left foot: Secondary | ICD-10-CM

## 2016-09-28 MED ORDER — NONFORMULARY OR COMPOUNDED ITEM: 1.0000 g | Freq: Four times a day (QID) | 2 refills | Status: DC

## 2016-09-28 MED ORDER — METHYLPREDNISOLONE 4 MG PO TBPK: ORAL_TABLET | ORAL | 0 refills | Status: DC

## 2016-09-29 MED ORDER — BETAMETHASONE SOD PHOS & ACET 6 (3-3) MG/ML IJ SUSP: 12.0000 mg | Freq: Once | INTRAMUSCULAR | Status: DC

## 2016-09-29 NOTE — Progress Notes (Signed)
Subjective:  Patient presents today for pain and tenderness to the left ankle which has been ongoing now for several weeks now. Patient denies trauma. Patient also complains of left great toe pain. Patient states that he does have a history of gout. Last gout attack was several years ago.  Patient presents for further treatment and evaluation.    Objective / Physical Exam:  General:  The patient is alert and oriented x3 in no acute distress.  Dermatology:  Skin is warm, dry and supple bilateral lower extremities. Negative for open lesions or macerations.  Vascular:  Palpable pedal pulses bilaterally. No edema or erythema noted. Capillary refill within normal limits.  Neurological:  Epicritic and protective threshold grossly intact bilaterally.   Musculoskeletal Exam:  Pain on palpation and range of motion of the left first MPJ. Negative for crepitus. Pain on palpation to the anterior lateral medial aspects of the patient's left ankle. Mild edema noted.  Range of motion within normal limits to all pedal and ankle joints bilateral. Muscle strength 5/5 in all groups bilateral.   Radiographic Exam:  Normal osseous mineralization. Joint spaces preserved. No fracture/dislocation/boney destruction.     Assessment: #1 pain in left ankle #2 synovitis of left ankle #3 capsulitis of left ankle #4 capsulitis left first MPJ. #5 pain in left foot #6 possible gouty attack left foot   Plan of Care:  #1 Patient was evaluated. #2 injection of 0.5 mL Celestone Soluspan injected in the patient's left ankle. #3 prescription for Medrol Dosepak as well as anti-inflammatory pain cream through [facility] given to the patient. #4 return to the clinic in 4 weeks    Dr. Edrick Kins, [place]

## 2016-10-01 ENCOUNTER — Telehealth: Payer: Self-pay | Admitting: *Deleted

## 2016-10-01 NOTE — Telephone Encounter (Signed)
Dr. Amalia Hailey ordered Achilles Tendonitis Cream. Faxed to [facility].

## 2016-10-26 ENCOUNTER — Encounter: Payer: Self-pay | Admitting: Podiatry

## 2016-10-26 ENCOUNTER — Ambulatory Visit (INDEPENDENT_AMBULATORY_CARE_PROVIDER_SITE_OTHER): Payer: Medicaid Other | Admitting: Podiatry

## 2016-10-26 DIAGNOSIS — M722 Plantar fascial fibromatosis: Secondary | ICD-10-CM

## 2016-10-26 DIAGNOSIS — M659 Synovitis and tenosynovitis, unspecified: Secondary | ICD-10-CM

## 2016-10-26 DIAGNOSIS — M7752 Other enthesopathy of left foot: Secondary | ICD-10-CM

## 2016-10-26 DIAGNOSIS — B353 Tinea pedis: Secondary | ICD-10-CM

## 2016-10-26 DIAGNOSIS — L299 Pruritus, unspecified: Secondary | ICD-10-CM

## 2016-10-26 DIAGNOSIS — M25572 Pain in left ankle and joints of left foot: Secondary | ICD-10-CM

## 2016-10-26 MED ORDER — NAPROXEN 500 MG PO TABS: 500.0000 mg | ORAL_TABLET | Freq: Two times a day (BID) | ORAL | 2 refills | Status: DC

## 2016-10-26 MED ORDER — CLOTRIMAZOLE-BETAMETHASONE 1-0.05 % EX CREA: 1.0000 "application " | TOPICAL_CREAM | Freq: Two times a day (BID) | CUTANEOUS | 1 refills | Status: DC

## 2016-11-04 MED ORDER — BETAMETHASONE SOD PHOS & ACET 6 (3-3) MG/ML IJ SUSP: 3.0000 mg | Freq: Once | INTRAMUSCULAR | Status: DC

## 2016-11-04 NOTE — Progress Notes (Signed)
Subjective:  Patient presents today for pain and tenderness to the left ankle which has been ongoing now for several weeks now. Patient denies trauma. Patient also complains of left great toe pain. Patient states that he does have a history of gout. Last gout attack was several years ago. Patient also has a new complaint of burning and itching sensation to the bilateral feet. Patient states that going on for several weeks now.  Patient presents for further treatment and evaluation.    Objective / Physical Exam:  General:  The patient is alert and oriented x3 in no acute distress.  Dermatology:  Dermatitis with pruritus noted to the bilateral weightbearing surfaces feet. Skin is warm, dry and supple bilateral lower extremities. Negative for open lesions or macerations.  Vascular:  Palpable pedal pulses bilaterally. No edema or erythema noted. Capillary refill within normal limits.  Neurological:  Epicritic and protective threshold grossly intact bilaterally.   Musculoskeletal Exam:  Pain on palpation and range of motion of the left first MPJ. Negative for crepitus. Pain on palpation to the anterior lateral medial aspects of the patient's left ankle. Mild edema noted.  Range of motion within normal limits to all pedal and ankle joints bilateral. Muscle strength 5/5 in all groups bilateral.   Radiographic Exam:  Normal osseous mineralization. Joint spaces preserved. No fracture/dislocation/boney destruction.     Assessment: #1 pain in left ankle #2 synovitis of left ankle #3 capsulitis of left ankle #4 capsulitis left first MPJ. #5 pain in left foot #6 tinea pedis bilateral feet #7 pruritus bilateral feet   Plan of Care:  #1 Patient was evaluated. #2 injection of 0.5 mL Celestone Soluspan injected in the patient's left ankle. #3 prescription for Lotrisone cream #4 continue to wear compression socks. Patient states that he currently owns a pair #5 return to the clinic in 4  weeks    Dr. Edrick Kins, [place]

## 2016-11-06 ENCOUNTER — Ambulatory Visit: Payer: Medicaid Other | Admitting: Family Medicine

## 2016-11-27 ENCOUNTER — Ambulatory Visit: Payer: Medicaid Other | Admitting: Family Medicine

## 2016-12-05 ENCOUNTER — Ambulatory Visit: Payer: Medicaid Other | Admitting: Podiatry

## 2016-12-12 ENCOUNTER — Ambulatory Visit (INDEPENDENT_AMBULATORY_CARE_PROVIDER_SITE_OTHER): Payer: Medicaid Other

## 2016-12-12 ENCOUNTER — Ambulatory Visit (INDEPENDENT_AMBULATORY_CARE_PROVIDER_SITE_OTHER): Payer: Medicaid Other | Admitting: Podiatry

## 2016-12-12 ENCOUNTER — Encounter: Payer: Self-pay | Admitting: Podiatry

## 2016-12-12 DIAGNOSIS — M79676 Pain in unspecified toe(s): Secondary | ICD-10-CM | POA: Diagnosis not present

## 2016-12-12 DIAGNOSIS — R52 Pain, unspecified: Secondary | ICD-10-CM

## 2016-12-12 DIAGNOSIS — M7752 Other enthesopathy of left foot: Secondary | ICD-10-CM

## 2016-12-12 DIAGNOSIS — B351 Tinea unguium: Secondary | ICD-10-CM | POA: Diagnosis not present

## 2016-12-12 DIAGNOSIS — M722 Plantar fascial fibromatosis: Secondary | ICD-10-CM

## 2016-12-12 NOTE — Progress Notes (Signed)
He presents today stating that he is still having pain about the first metatarsophalangeal joint of the left foot. Is also complaining of pain about the sinus tarsi bilateral left greater than right and his nails are long and would like to have them trimmed. He states that the joints are doing much better than they were initially and seemed to get better after each injection.  Objective: Vital signs are stable he is alert and oriented 3 pulses are palpable. He has good range of motion first metatarsophalangeal joint right foot and subtalar joints bilateral D is guarding and restricting range of motion of the first metatarsophalangeal joint left. He has pain on palpation of the sinus tarsi and on end range of motion of the subtalar joints bilaterally. His toenails are elongated and thickened and incurvated. I see no signs of infection.  Assessment: Painful capsulitis of the subtalar joints bilateral. 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 Painful elongated toenails bilateral.  Plan: 3 nails 1 through 5 bilateral. Injected the sinus tarsitis bilateral today and periarticular injection around the first metatarsophalangeal joint with Kenalog left foot. I will follow up with him in 2-3 months.

## 2016-12-13 ENCOUNTER — Encounter: Payer: Self-pay | Admitting: Family Medicine

## 2016-12-13 ENCOUNTER — Ambulatory Visit (INDEPENDENT_AMBULATORY_CARE_PROVIDER_SITE_OTHER): Payer: Medicaid Other | Admitting: Family Medicine

## 2016-12-13 DIAGNOSIS — F419 Anxiety disorder, unspecified: Secondary | ICD-10-CM | POA: Diagnosis not present

## 2016-12-13 MED ORDER — HYDROXYZINE HCL 10 MG PO TABS: 10.0000 mg | ORAL_TABLET | Freq: Every evening | ORAL | 6 refills | Status: DC | PRN

## 2016-12-13 MED ORDER — CLOTRIMAZOLE-BETAMETHASONE 1-0.05 % EX CREA: 1.0000 "application " | TOPICAL_CREAM | Freq: Two times a day (BID) | CUTANEOUS | 1 refills | Status: DC

## 2016-12-13 NOTE — Assessment & Plan Note (Signed)
Stable. Continue current regimen. Continue to monitor. Call with any concerns. Continue follow ups every 3 months.

## 2016-12-13 NOTE — Progress Notes (Signed)
BP 128/76 (BP Location: Left Arm, Patient Position: Sitting, Cuff Size: Normal)   Pulse 83   Temp 98.6 F (37 C)   Wt 193 lb 3.2 oz (87.6 kg)   SpO2 98%   BMI 33.16 kg/m    Subjective:    Patient ID: Jesus Guerrero, male    DOB: 1971-03-08, 45 y.o.   MRN: [id number]  HPI: Jesus Guerrero is a 45 y.o. male  Chief Complaint  Patient presents with  . Anxiety   Here today for 3 month follow up to check in and see how he's doing. Still very anxious. Still fixating on aches and pains and bug bites. Stable with his podiatrist. Stable with the creams for his bug bites. Doing well with the hydroxyzine for bedtime. Feeling well. No concerns. No more issues with his living situation.   Relevant past medical, surgical, family and social history reviewed and updated as indicated. Interim medical history since our last visit reviewed. Allergies and medications reviewed and updated.  Review of Systems  Constitutional: Negative.   Respiratory: Negative.   Cardiovascular: Negative.   Skin: Negative for color change, pallor, rash and wound.  Psychiatric/Behavioral: Negative for agitation, behavioral problems, confusion, decreased concentration, dysphoric mood, hallucinations, self-injury, sleep disturbance and suicidal ideas. The patient is nervous/anxious. The patient is not hyperactive.     Per HPI unless specifically indicated above     Objective:    BP 128/76 (BP Location: Left Arm, Patient Position: Sitting, Cuff Size: Normal)   Pulse 83   Temp 98.6 F (37 C)   Wt 193 lb 3.2 oz (87.6 kg)   SpO2 98%   BMI 33.16 kg/m   Wt Readings from Last 3 Encounters:  12/13/16 193 lb 3.2 oz (87.6 kg)  09/26/16 194 lb (88 kg)  09/21/16 188 lb (85.3 kg)    Physical Exam  Constitutional: He is oriented to person, place, and time. He appears well-developed and well-nourished. No distress.  HENT:  Head: Normocephalic and atraumatic.  Right Ear: Hearing normal.  Left Ear: Hearing normal.    Nose: Nose normal.  Eyes: Conjunctivae and lids are normal. Right eye exhibits no discharge. Left eye exhibits no discharge. No scleral icterus.  Cardiovascular: Normal rate, regular rhythm, normal heart sounds and intact distal pulses.  Exam reveals no friction rub.   No murmur heard. Pulmonary/Chest: Effort normal and breath sounds normal. No respiratory distress. He has no wheezes. He has no rales. He exhibits no tenderness.  Musculoskeletal: Normal range of motion.  Neurological: He is alert and oriented to person, place, and time.  Skin: Skin is warm, dry and intact. No rash noted. No erythema. No pallor.  Bug bites on his back  Psychiatric: His speech is normal and behavior is normal. Judgment and thought content normal. His mood appears anxious. Cognition and memory are normal.  Nursing note and vitals reviewed.   Results for orders placed or performed in visit on 09/26/16  Microscopic Examination  Result Value Ref Range   WBC, UA 0-5 0 - 5 /hpf   RBC, UA 3-10 (A) 0 - 2 /hpf   Epithelial Cells (non renal) None seen 0 - 10 /hpf   Mucus, UA Present Not Estab.   Bacteria, UA Few None seen/Few  UA/M w/rflx Culture, Routine  Result Value Ref Range   Specific Gravity, UA 1.020 1.005 - 1.030   pH, UA 6.0 5.0 - 7.5   Color, UA Yellow Yellow   Appearance Ur Clear Clear  Leukocytes, UA Negative Negative   Protein, UA Trace Negative/Trace   Glucose, UA Negative Negative   Ketones, UA Negative Negative   RBC, UA 1+ (A) Negative   Bilirubin, UA Negative Negative   Urobilinogen, Ur 0.2 0.2 - 1.0 mg/dL   Nitrite, UA Negative Negative   Microscopic Examination See below:       Assessment & Plan:   Problem List Items Addressed This Visit      Other   Acute anxiety    Stable. Continue current regimen. Continue to monitor. Call with any concerns. Continue follow ups every 3 months.       Relevant Medications   hydrOXYzine (ATARAX/VISTARIL) 10 MG tablet       Follow up  plan: Return in about 3 months (around 03/13/2017) for Physical .

## 2016-12-21 ENCOUNTER — Encounter: Payer: Self-pay | Admitting: Family Medicine

## 2016-12-21 ENCOUNTER — Ambulatory Visit (INDEPENDENT_AMBULATORY_CARE_PROVIDER_SITE_OTHER): Payer: Medicaid Other | Admitting: Family Medicine

## 2016-12-21 VITALS — BP 128/76 | HR 88 | Temp 97.7°F | Wt 192.7 lb

## 2016-12-21 DIAGNOSIS — M25572 Pain in left ankle and joints of left foot: Secondary | ICD-10-CM

## 2016-12-21 MED ORDER — PREDNISONE 20 MG PO TABS: 40.0000 mg | ORAL_TABLET | Freq: Every day | ORAL | 0 refills | Status: DC

## 2016-12-21 NOTE — Patient Instructions (Signed)
Follow up as needed

## 2016-12-21 NOTE — Progress Notes (Signed)
   BP 128/76 (BP Location: Left Arm, Patient Position: Sitting, Cuff Size: Large)   Pulse 88   Temp 97.7 F (36.5 C)   Wt 192 lb 11.2 oz (87.4 kg)   SpO2 98%   BMI 33.08 kg/m    Subjective:    Patient ID: Jesus Guerrero, male    DOB: [birth year], 45 y.o.   MRN: [id number]  HPI: Jesus Guerrero is a 45 y.o. male  Chief Complaint  Patient presents with  . Foot Pain    x 2 days, bilateral with redness, patient states that his right arm hurts also   Patient presents with 2 day history of left great toe and heel pain, redness, warmth and edema. States walking is severely painful, as is any amount of pressure to the area. Also having same symptoms in right elbow as well. Has had several issues with feet lately for which he is receiving steroid injections by podiatry, but this feels slightly different. Has been taking naproxen with no relief. No known injury or trauma. Denies fever, chills, sweats, streaking, or wounds in either area.  Relevant past medical, surgical, family and social history reviewed and updated as indicated. Interim medical history since our last visit reviewed. Allergies and medications reviewed and updated.  Review of Systems  Constitutional: Negative.   HENT: Negative.   Eyes: Negative.   Respiratory: Negative.   Cardiovascular: Negative.   Gastrointestinal: Negative.   Genitourinary: Negative.   Musculoskeletal: Positive for arthralgias.  Skin:       Joint erythema left foot and right elbow  Neurological: Negative.   Psychiatric/Behavioral: Negative.     Per HPI unless specifically indicated above     Objective:    BP 128/76 (BP Location: Left Arm, Patient Position: Sitting, Cuff Size: Large)   Pulse 88   Temp 97.7 F (36.5 C)   Wt 192 lb 11.2 oz (87.4 kg)   SpO2 98%   BMI 33.08 kg/m   Wt Readings from Last 3 Encounters:  12/21/16 192 lb 11.2 oz (87.4 kg)  12/13/16 193 lb 3.2 oz (87.6 kg)  09/26/16 194 lb (88 kg)    Physical Exam    Constitutional: He is oriented to person, place, and time. He appears well-developed and well-nourished.  HENT:  Head: Atraumatic.  Eyes: Conjunctivae are normal. Pupils are equal, round, and reactive to light.  Neck: Normal range of motion. Neck supple.  Cardiovascular: Normal rate and normal heart sounds.   Pulmonary/Chest: Effort normal and breath sounds normal. No respiratory distress.  Musculoskeletal:  ROM intact, but very painful in affected joints Some erythema and edema in both areas No bruising or wounds present  Neurological: He is alert and oriented to person, place, and time.  Skin: Skin is warm and dry.  Psychiatric: He has a normal mood and affect. His behavior is normal.  Nursing note and vitals reviewed.     Assessment & Plan:   Problem List Items Addressed This Visit    None    Visit Diagnoses    Arthralgia of left foot    -  Primary   Will check a uric acid. Trial of prednisone, discussed taking tylenol for pain prn. No naproxen while on prednisone. Follow up if no improvement   Relevant Orders   Uric acid       Follow up plan: Return if symptoms worsen or fail to improve.

## 2016-12-22 LAB — URIC ACID: URIC ACID: 8.1 mg/dL (ref 3.7–8.6)

## 2016-12-26 ENCOUNTER — Telehealth: Payer: Self-pay | Admitting: Family Medicine

## 2016-12-26 NOTE — Telephone Encounter (Signed)
Patient's guardian notified.  

## 2016-12-26 NOTE — Telephone Encounter (Signed)
Please call pt and let him know that his uric acid level is stable so nothing we need to do

## 2017-01-14 ENCOUNTER — Encounter: Payer: Medicaid Other | Admitting: Family Medicine

## 2017-02-12 ENCOUNTER — Other Ambulatory Visit: Payer: Self-pay | Admitting: Podiatry

## 2017-02-12 NOTE — Telephone Encounter (Signed)
Pt needs an appt

## 2017-02-13 ENCOUNTER — Ambulatory Visit (INDEPENDENT_AMBULATORY_CARE_PROVIDER_SITE_OTHER): Payer: Medicaid Other | Admitting: Podiatry

## 2017-02-13 ENCOUNTER — Encounter: Payer: Self-pay | Admitting: Podiatry

## 2017-02-13 DIAGNOSIS — M722 Plantar fascial fibromatosis: Secondary | ICD-10-CM | POA: Diagnosis not present

## 2017-02-13 DIAGNOSIS — M779 Enthesopathy, unspecified: Secondary | ICD-10-CM

## 2017-02-13 MED ORDER — MELOXICAM 15 MG PO TABS: 15.0000 mg | ORAL_TABLET | Freq: Every day | ORAL | 3 refills | Status: DC

## 2017-02-13 NOTE — Progress Notes (Signed)
He presents today and states these approximate 60% improved from where we started once we started the injections series.  Objective: Vital signs are stable he is alert and oriented 3. Pulses are palpable. He has pain on end range of motion of the subtalar joint bilaterally and the first metatarsophalangeal joint left.  Assessment: Subtalar joint capsulitis and capsulitis of the first metatarsophalangeal joint.  Plan: I injected sinus tarsi today with Kenalog and local anesthetic and injected the first metatarsophalangeal joint left with Kenalog and local anesthetic. I will follow-up with him in approximately 2 months. Discussed in great detail today the need for a new pair of shoes.

## 2017-03-07 ENCOUNTER — Ambulatory Visit (INDEPENDENT_AMBULATORY_CARE_PROVIDER_SITE_OTHER): Payer: Medicaid Other | Admitting: Family Medicine

## 2017-03-07 ENCOUNTER — Encounter: Payer: Self-pay | Admitting: Family Medicine

## 2017-03-07 VITALS — BP 121/73 | HR 77 | Temp 98.2°F | Resp 17 | Ht 66.0 in | Wt 188.0 lb

## 2017-03-07 DIAGNOSIS — F419 Anxiety disorder, unspecified: Secondary | ICD-10-CM

## 2017-03-07 DIAGNOSIS — Z0000 Encounter for general adult medical examination without abnormal findings: Secondary | ICD-10-CM | POA: Diagnosis not present

## 2017-03-07 DIAGNOSIS — R3129 Other microscopic hematuria: Secondary | ICD-10-CM

## 2017-03-07 DIAGNOSIS — E782 Mixed hyperlipidemia: Secondary | ICD-10-CM

## 2017-03-07 DIAGNOSIS — L309 Dermatitis, unspecified: Secondary | ICD-10-CM | POA: Diagnosis not present

## 2017-03-07 DIAGNOSIS — E663 Overweight: Secondary | ICD-10-CM

## 2017-03-07 DIAGNOSIS — R1031 Right lower quadrant pain: Secondary | ICD-10-CM | POA: Diagnosis not present

## 2017-03-07 DIAGNOSIS — K219 Gastro-esophageal reflux disease without esophagitis: Secondary | ICD-10-CM

## 2017-03-07 LAB — UA/M W/RFLX CULTURE, ROUTINE
Bilirubin, UA: NEGATIVE
Glucose, UA: NEGATIVE
Ketones, UA: NEGATIVE
Leukocytes, UA: NEGATIVE
Nitrite, UA: NEGATIVE
Protein, UA: NEGATIVE
Specific Gravity, UA: 1.015 (ref 1.005–1.030)
Urobilinogen, Ur: 0.2 mg/dL (ref 0.2–1.0)
pH, UA: 6.5 (ref 5.0–7.5)

## 2017-03-07 LAB — MICROSCOPIC EXAMINATION
Bacteria, UA: NONE SEEN
Epithelial Cells (non renal): NONE SEEN /HPF
RBC, UA: NONE SEEN /HPF
WBC, UA: NONE SEEN /HPF

## 2017-03-07 MED ORDER — CLOTRIMAZOLE-BETAMETHASONE 1-0.05 % EX CREA: 1.0000 "application " | TOPICAL_CREAM | Freq: Two times a day (BID) | CUTANEOUS | 1 refills | Status: DC

## 2017-03-07 NOTE — Assessment & Plan Note (Signed)
Stable. Continue current regimen. Continue to monitor. Call with any concerns.  

## 2017-03-07 NOTE — Assessment & Plan Note (Signed)
Rechecking levels today. Continue diet and exercise. Call with any concerns.  

## 2017-03-07 NOTE — Assessment & Plan Note (Signed)
Stable. No concerns. Continue current regimen. Continue to monitor.

## 2017-03-07 NOTE — Assessment & Plan Note (Signed)
Continue to follow with urology. Rechecking levels today. Await results.

## 2017-03-07 NOTE — Progress Notes (Signed)
BP 121/73 (BP Location: Left Arm, Patient Position: Sitting, Cuff Size: Normal)   Pulse 77   Temp 98.2 F (36.8 C) (Oral)   Resp 17   Ht 5\' 6"  (1.676 m)   Wt 188 lb (85.3 kg)   SpO2 98%   BMI 30.34 kg/m    Subjective:    Patient ID: Jesus Guerrero, male    DOB: 01-08-1971, 46 y.o.   MRN: [id number]  HPI: Jesus Guerrero is a 46 y.o. male presenting on 03/07/2017 for comprehensive medical examination. Current medical complaints include:none  [facility] Interim Problems from his last visit: no  Depression Screen done today and results listed below:  Depression screen [facility] 2/9 03/07/2017 01/13/2016  Decreased Interest 0 0  Down, Depressed, Hopeless 0 0  PHQ - 2 Score 0 0    Past Medical History:  Past Medical History:  Diagnosis Date  . Autism   . Constipation   . GERD (gastroesophageal reflux disease)   . Gout   . High cholesterol   . History of kidney stones   . Overweight   . Prostatitis   . Suspicious nevus   . UTI (lower urinary tract infection)     Surgical History:  Past Surgical History:  Procedure Laterality Date  . CHOLECYSTECTOMY  2014  . CYSTOSCOPY W/ RETROGRADES Bilateral 06/29/2015   Procedure: CYSTOSCOPY WITH RETROGRADE PYELOGRAM;  Surgeon: Hollice Espy, MD;  Location: [facility] ORS;  Service: Urology;  Laterality: Bilateral;    Medications:  Current Outpatient Prescriptions on File Prior to Visit  Medication Sig  . acetaminophen (TYLENOL) 500 MG tablet Take 500 mg by mouth every 6 (six) hours as needed.  . hydrOXYzine (ATARAX/VISTARIL) 10 MG tablet Take 1 tablet (10 mg total) by mouth at bedtime as needed for itching.  Marland Kitchen ketoconazole (NIZORAL) 2 % cream Apply 1 application topically 2 (two) times daily.  Marland Kitchen loratadine (CLARITIN) 10 MG tablet Take 10 mg by mouth daily.  . meloxicam (MOBIC) 15 MG tablet Take 1 tablet (15 mg total) by mouth daily.  . NONFORMULARY OR COMPOUNDED ITEM Apply 1-2 g topically 4 (four) times daily.    Current Facility-Administered Medications on File Prior to Visit  Medication  . betamethasone acetate-betamethasone sodium phosphate (CELESTONE) injection 12 mg  . betamethasone acetate-betamethasone sodium phosphate (CELESTONE) injection 3 mg    Allergies:  Allergies  Allergen Reactions  . Bactrim [Sulfamethoxazole-Trimethoprim] Rash  . Codeine Swelling    Social History:  Social History   Social History  . Marital status: Single    Spouse name: N/A  . Number of children: N/A  . Years of education: N/A   Occupational History  . Not on file.   Social History Main Topics  . Smoking status: Never Smoker  . Smokeless tobacco: Never Used  . Alcohol use No  . Drug use: No  . Sexual activity: Not on file   Other Topics Concern  . Not on file   Social History Narrative  . No narrative on file   History  Smoking Status  . Never Smoker  Smokeless Tobacco  . Never Used   History  Alcohol Use No    Family History:  Family History  Problem Relation Age of Onset  . Congestive Heart Failure Father   . Kidney failure Father     Past medical history, surgical history, medications, allergies, family history and social history reviewed with patient today and changes made to appropriate areas of the chart.  Review of Systems  Constitutional: Negative.   HENT: Negative.   Eyes: Negative.   Respiratory: Negative.   Cardiovascular: Negative.   Gastrointestinal: Positive for abdominal pain. Negative for blood in stool, constipation, diarrhea, heartburn, melena, nausea and vomiting.  Genitourinary: Negative.   Musculoskeletal: Positive for back pain. Negative for falls, joint pain, myalgias and neck pain.  Skin: Positive for itching. Negative for rash.  Neurological: Negative.   Endo/Heme/Allergies: Positive for polydipsia. Negative for environmental allergies. Does not bruise/bleed easily.  Psychiatric/Behavioral: Negative.     All other ROS negative except what  is listed above and in the HPI.      Objective:    BP 121/73 (BP Location: Left Arm, Patient Position: Sitting, Cuff Size: Normal)   Pulse 77   Temp 98.2 F (36.8 C) (Oral)   Resp 17   Ht 5\' 6"  (1.676 m)   Wt 188 lb (85.3 kg)   SpO2 98%   BMI 30.34 kg/m   Wt Readings from Last 3 Encounters:  03/07/17 188 lb (85.3 kg)  12/21/16 192 lb 11.2 oz (87.4 kg)  12/13/16 193 lb 3.2 oz (87.6 kg)    Physical Exam  Constitutional: He is oriented to person, place, and time. He appears well-developed and well-nourished. No distress.  HENT:  Head: Normocephalic and atraumatic.  Right Ear: Hearing, tympanic membrane, external ear and ear canal normal.  Left Ear: Hearing, tympanic membrane, external ear and ear canal normal.  Nose: Nose normal.  Mouth/Throat: Uvula is midline, oropharynx is clear and moist and mucous membranes are normal. No oropharyngeal exudate.  Eyes: Conjunctivae, EOM and lids are normal. Pupils are equal, round, and reactive to light. Right eye exhibits no discharge. Left eye exhibits no discharge. No scleral icterus.  Neck: Normal range of motion. Neck supple. No JVD present. No tracheal deviation present. No thyromegaly present.  Cardiovascular: Normal rate, regular rhythm, normal heart sounds and intact distal pulses.  Exam reveals no gallop and no friction rub.   No murmur heard. Pulmonary/Chest: Effort normal and breath sounds normal. No stridor. No respiratory distress. He has no wheezes. He has no rales. He exhibits no tenderness.  Abdominal: Soft. Bowel sounds are normal. He exhibits no distension and no mass. There is no tenderness. There is no rebound and no guarding.  Genitourinary:  Genitourinary Comments: Genital exam deferred- done at urology  Musculoskeletal: Normal range of motion. He exhibits no edema, tenderness or deformity.  Lymphadenopathy:    He has no cervical adenopathy.  Neurological: He is alert and oriented to person, place, and time. He displays  normal reflexes. No cranial nerve deficit. He exhibits normal muscle tone. Coordination normal.  Skin: Skin is warm, dry and intact. No rash noted. He is not diaphoretic. There is erythema. No pallor.  Excoriated irritated skin   Psychiatric: He has a normal mood and affect. His speech is normal and behavior is normal. Judgment and thought content normal. Cognition and memory are normal.  Nursing note and vitals reviewed.   Results for orders placed or performed in visit on 12/21/16  Uric acid  Result Value Ref Range   Uric Acid 8.1 3.7 - 8.6 mg/dL      Assessment & Plan:   Problem List Items Addressed This Visit      Digestive   GERD (gastroesophageal reflux disease)    Stable. Continue current regimen. Continue to monitor. Call with any concerns.       Relevant Orders   CBC with Differential/Platelet  Comprehensive metabolic panel     Musculoskeletal and Integument   Eczema    Stable. Continue current regimen. Continue to monitor. Call with any concerns.         Genitourinary   Microscopic hematuria    Continue to follow with urology. Rechecking levels today. Await results.       Relevant Orders   UA/M w/rflx Culture, Routine     Other   Overweight   Relevant Orders   Comprehensive metabolic panel   TSH   Abdominal pain    Likely due to recent GI bug. Will check labs. Await results.       Acute anxiety    Stable. No concerns. Continue current regimen. Continue to monitor.       Hyperlipidemia    Rechecking levels today. Continue diet and exercise. Call with any concerns.       Relevant Orders   Lipid Panel w/o Chol/HDL Ratio    Other Visit Diagnoses    Routine general medical examination at a health care facility    -  Primary   Up to date on vaccines. Screening labs checked today. Continue diet and exercise. Call with any concerns.    Relevant Orders   CBC with Differential/Platelet   Comprehensive metabolic panel   Lipid Panel w/o Chol/HDL Ratio    TSH   UA/M w/rflx Culture, Routine       Discussed aspirin prophylaxis for myocardial infarction prevention and decision was it was not indicated  LABORATORY TESTING:  Health maintenance labs ordered today as discussed above.   IMMUNIZATIONS:   - Tdap: Tetanus vaccination status reviewed: last tetanus booster within 10 years. - Influenza: Up to date - Pneumovax: Not applicable  PATIENT COUNSELING:    Sexuality: Discussed sexually transmitted diseases, partner selection, use of condoms, avoidance of unintended pregnancy  and contraceptive alternatives.   Advised to avoid cigarette smoking.  I discussed with the patient that most people either abstain from alcohol or drink within safe limits (<=14/week and <=4 drinks/occasion for males, <=7/weeks and <= 3 drinks/occasion for females) and that the risk for alcohol disorders and other health effects rises proportionally with the number of drinks per week and how often a drinker exceeds daily limits.  Discussed cessation/primary prevention of drug use and availability of treatment for abuse.   Diet: Encouraged to adjust caloric intake to maintain  or achieve ideal body weight, to reduce intake of dietary saturated fat and total fat, to limit sodium intake by avoiding high sodium foods and not adding table salt, and to maintain adequate dietary potassium and calcium preferably from fresh fruits, vegetables, and low-fat dairy products.    stressed the importance of regular exercise  Injury prevention: Discussed safety belts, safety helmets, smoke detector, smoking near bedding or upholstery.   Dental health: Discussed importance of regular tooth brushing, flossing, and dental visits.   Follow up plan: NEXT PREVENTATIVE PHYSICAL DUE IN 1 YEAR. Return in about 3 months (around 06/07/2017).

## 2017-03-07 NOTE — Patient Instructions (Addendum)
 Health Maintenance, Male A healthy lifestyle and preventive care is important for your health and wellness. Ask your health care provider about what schedule of regular examinations is right for you. What should I know about weight and diet?  Eat a Healthy Diet  Eat plenty of vegetables, fruits, whole grains, low-fat dairy products, and lean protein.  Do not eat a lot of foods high in solid fats, added sugars, or salt. Maintain a Healthy Weight  Regular exercise can help you achieve or maintain a healthy weight. You should:  Do at least 150 minutes of exercise each week. The exercise should increase your heart rate and make you sweat (moderate-intensity exercise).  Do strength-training exercises at least twice a week. Watch Your Levels of Cholesterol and Blood Lipids  Have your blood tested for lipids and cholesterol every 5 years starting at 47 years of age. If you are at high risk for heart disease, you should start having your blood tested when you are 47 years old. You may need to have your cholesterol levels checked more often if:  Your lipid or cholesterol levels are high.  You are older than 47 years of age.  You are at high risk for heart disease. What should I know about cancer screening? Many types of cancers can be detected early and may often be prevented. Lung Cancer  You should be screened every year for lung cancer if:  You are a current smoker who has smoked for at least 30 years.  You are a former smoker who has quit within the past 15 years.  Talk to your health care provider about your screening options, when you should start screening, and how often you should be screened. Colorectal Cancer  Routine colorectal cancer screening usually begins at 47 years of age and should be repeated every 5-10 years until you are 47 years old. You may need to be screened more often if early forms of precancerous polyps or small growths are found. Your health care provider  may recommend screening at an earlier age if you have risk factors for colon cancer.  Your health care provider may recommend using home test kits to check for hidden blood in the stool.  A small camera at the end of a tube can be used to examine your colon (sigmoidoscopy or colonoscopy). This checks for the earliest forms of colorectal cancer. Prostate and Testicular Cancer  Depending on your age and overall health, your health care provider may do certain tests to screen for prostate and testicular cancer.  Talk to your health care provider about any symptoms or concerns you have about testicular or prostate cancer. Skin Cancer  Check your skin from head to toe regularly.  Tell your health care provider about any new moles or changes in moles, especially if:  There is a change in a mole's size, shape, or color.  You have a mole that is larger than a pencil eraser.  Always use sunscreen. Apply sunscreen liberally and repeat throughout the day.  Protect yourself by wearing long sleeves, pants, a wide-brimmed hat, and sunglasses when outside. What should I know about heart disease, diabetes, and high blood pressure?  If you are 18-39 years of age, have your blood pressure checked every 3-5 years. If you are 40 years of age or older, have your blood pressure checked every year. You should have your blood pressure measured twice-once when you are at a hospital or clinic, and once when you are not at   hospital or clinic. Record the average of the two measurements. To check your blood pressure when you are not at a hospital or clinic, you can use:  An automated blood pressure machine at a pharmacy.  A home blood pressure monitor.  Talk to your health care provider about your target blood pressure.  If you are between 68-69 years old, ask your health care provider if you should take aspirin to prevent heart disease.  Have regular diabetes screenings by checking your fasting blood sugar  level.  If you are at a normal weight and have a low risk for diabetes, have this test once every three years after the age of 79.  If you are overweight and have a high risk for diabetes, consider being tested at a younger age or more often.  A one-time screening for abdominal aortic aneurysm (AAA) by ultrasound is recommended for men aged 72-75 years who are current or former smokers. What should I know about preventing infection? Hepatitis B  If you have a higher risk for hepatitis B, you should be screened for this virus. Talk with your health care provider to find out if you are at risk for hepatitis B infection. Hepatitis C  Blood testing is recommended for:  Everyone born from [birth year] through 1966.  Anyone with known risk factors for hepatitis C. Sexually Transmitted Diseases (STDs)  You should be screened each year for STDs including gonorrhea and chlamydia if:  You are sexually active and are younger than 47 years of age.  You are older than 47 years of age and your health care provider tells you that you are at risk for this type of infection.  Your sexual activity has changed since you were last screened and you are at an increased risk for chlamydia or gonorrhea. Ask your health care provider if you are at risk.  Talk with your health care provider about whether you are at high risk of being infected with HIV. Your health care provider may recommend a prescription medicine to help prevent HIV infection. What else can I do?  Schedule regular health, dental, and eye exams.  Stay current with your vaccines (immunizations).  Do not use any tobacco products, such as cigarettes, chewing tobacco, and e-cigarettes. If you need help quitting, ask your health care provider.  Limit alcohol intake to no more than 2 drinks per day. One drink equals 12 ounces of beer, 5 ounces of wine, or 1 ounces of hard liquor.  Do not use street drugs.  Do not share needles.  Ask your health  care provider for help if you need support or information about quitting drugs.  Tell your health care provider if you often feel depressed.  Tell your health care provider if you have ever been abused or do not feel safe at home. This information is not intended to replace advice given to you by your health care provider. Make sure you discuss any questions you have with your health care provider. Document Released: 06/14/2008 Document Revised: 08/15/2016 Document Reviewed: 09/20/2015 Elsevier Interactive Patient Education  2017 [place].  Back Exercises If you have pain in your back, do these exercises 2-3 times each day or as told by your doctor. When the pain goes away, do the exercises once each day, but repeat the steps more times for each exercise (do more repetitions). If you do not have pain in your back, do these exercises once each day or as told by your doctor. Exercises Single Knee  to Chest   Do these steps 3-5 times in a row for each leg: 1. Lie on your back on a firm bed or the floor with your legs stretched out. 2. Bring one knee to your chest. 3. Hold your knee to your chest by grabbing your knee or thigh. 4. Pull on your knee until you feel a gentle stretch in your lower back. 5. Keep doing the stretch for 10-30 seconds. 6. Slowly let go of your leg and straighten it. Pelvic Tilt   Do these steps 5-10 times in a row: 1. Lie on your back on a firm bed or the floor with your legs stretched out. 2. Bend your knees so they point up to the ceiling. Your feet should be flat on the floor. 3. Tighten your lower belly (abdomen) muscles to press your lower back against the floor. This will make your tailbone point up to the ceiling instead of pointing down to your feet or the floor. 4. Stay in this position for 5-10 seconds while you gently tighten your muscles and breathe evenly. Cat-Cow   Do these steps until your lower back bends more easily: 1. Get on your hands and  knees on a firm surface. Keep your hands under your shoulders, and keep your knees under your hips. You may put padding under your knees. 2. Let your head hang down, and make your tailbone point down to the floor so your lower back is round like the back of a cat. 3. Stay in this position for 5 seconds. 4. Slowly lift your head and make your tailbone point up to the ceiling so your back hangs low (sags) like the back of a cow. 5. Stay in this position for 5 seconds. Press-Ups   Do these steps 5-10 times in a row: 1. Lie on your belly (face-down) on the floor. 2. Place your hands near your head, about shoulder-width apart. 3. While you keep your back relaxed and keep your hips on the floor, slowly straighten your arms to raise the top half of your body and lift your shoulders. Do not use your back muscles. To make yourself more comfortable, you may change where you place your hands. 4. Stay in this position for 5 seconds. 5. Slowly return to lying flat on the floor. Bridges   Do these steps 10 times in a row: 1. Lie on your back on a firm surface. 2. Bend your knees so they point up to the ceiling. Your feet should be flat on the floor. 3. Tighten your butt muscles and lift your butt off of the floor until your waist is almost as high as your knees. If you do not feel the muscles working in your butt and the back of your thighs, slide your feet 1-2 inches farther away from your butt. 4. Stay in this position for 3-5 seconds. 5. Slowly lower your butt to the floor, and let your butt muscles relax. If this exercise is too easy, try doing it with your arms crossed over your chest. Belly Crunches   Do these steps 5-10 times in a row: 1. Lie on your back on a firm bed or the floor with your legs stretched out. 2. Bend your knees so they point up to the ceiling. Your feet should be flat on the floor. 3. Cross your arms over your chest. 4. Tip your chin a little bit toward your chest but do not  bend your neck. 5. Tighten your belly muscles and  slowly raise your chest just enough to lift your shoulder blades a tiny bit off of the floor. 6. Slowly lower your chest and your head to the floor. Back Lifts  Do these steps 5-10 times in a row: 1. Lie on your belly (face-down) with your arms at your sides, and rest your forehead on the floor. 2. Tighten the muscles in your legs and your butt. 3. Slowly lift your chest off of the floor while you keep your hips on the floor. Keep the back of your head in line with the curve in your back. Look at the floor while you do this. 4. Stay in this position for 3-5 seconds. 5. Slowly lower your chest and your face to the floor. Contact a doctor if:  Your back pain gets a lot worse when you do an exercise.  Your back pain does not lessen 2 hours after you exercise. If you have any of these problems, stop doing the exercises. Do not do them again unless your doctor says it is okay. Get help right away if:  You have sudden, very bad back pain. If this happens, stop doing the exercises. Do not do them again unless your doctor says it is okay. This information is not intended to replace advice given to you by your health care provider. Make sure you discuss any questions you have with your health care provider. Document Released: 01/19/2011 Document Revised: 05/24/2016 Document Reviewed: 02/10/2015 Elsevier Interactive Patient Education  2017 [employer].

## 2017-03-07 NOTE — Assessment & Plan Note (Signed)
Likely due to recent GI bug. Will check labs. Await results.

## 2017-03-08 ENCOUNTER — Encounter: Payer: Self-pay | Admitting: Family Medicine

## 2017-03-08 LAB — CBC WITH DIFFERENTIAL/PLATELET
Basophils Absolute: 0 10*3/uL (ref 0.0–0.2)
Basos: 0 %
EOS (ABSOLUTE): 0.1 10*3/uL (ref 0.0–0.4)
EOS: 1 %
HEMATOCRIT: 44.2 % (ref 37.5–51.0)
HEMOGLOBIN: 15.1 g/dL (ref 13.0–17.7)
IMMATURE GRANULOCYTES: 0 %
Immature Grans (Abs): 0 10*3/uL (ref 0.0–0.1)
LYMPHS ABS: 1.8 10*3/uL (ref 0.7–3.1)
Lymphs: 25 %
MCH: 30.1 pg (ref 26.6–33.0)
MCHC: 34.2 g/dL (ref 31.5–35.7)
MCV: 88 fL (ref 79–97)
MONOCYTES: 5 %
Monocytes Absolute: 0.4 10*3/uL (ref 0.1–0.9)
NEUTROS PCT: 69 %
Neutrophils Absolute: 4.8 10*3/uL (ref 1.4–7.0)
Platelets: 229 10*3/uL (ref 150–379)
RBC: 5.02 x10E6/uL (ref 4.14–5.80)
RDW: 14.1 % (ref 12.3–15.4)
WBC: 7 10*3/uL (ref 3.4–10.8)

## 2017-03-08 LAB — COMPREHENSIVE METABOLIC PANEL
A/G RATIO: 1.6 (ref 1.2–2.2)
ALBUMIN: 4.6 g/dL (ref 3.5–5.5)
ALT: 17 IU/L (ref 0–44)
AST: 18 IU/L (ref 0–40)
Alkaline Phosphatase: 61 IU/L (ref 39–117)
BUN / CREAT RATIO: 24 — AB (ref 9–20)
BUN: 26 mg/dL — ABNORMAL HIGH (ref 6–24)
Bilirubin Total: 0.4 mg/dL (ref 0.0–1.2)
CO2: 27 mmol/L (ref 18–29)
CREATININE: 1.09 mg/dL (ref 0.76–1.27)
Calcium: 9.6 mg/dL (ref 8.7–10.2)
Chloride: 98 mmol/L (ref 96–106)
GFR calc non Af Amer: 80 mL/min/{1.73_m2} (ref >59)
GFR, EST AFRICAN AMERICAN: 93 mL/min/{1.73_m2} (ref >59)
GLOBULIN, TOTAL: 2.9 g/dL (ref 1.5–4.5)
Glucose: 91 mg/dL (ref 65–99)
Potassium: 4.7 mmol/L (ref 3.5–5.2)
SODIUM: 140 mmol/L (ref 134–144)
Total Protein: 7.5 g/dL (ref 6.0–8.5)

## 2017-03-08 LAB — TSH: TSH: 2.37 u[IU]/mL (ref 0.450–4.500)

## 2017-03-08 LAB — LIPID PANEL W/O CHOL/HDL RATIO
CHOLESTEROL TOTAL: 208 mg/dL — AB (ref 100–199)
HDL: 46 mg/dL (ref >39)
LDL Calculated: 122 mg/dL — ABNORMAL HIGH (ref 0–99)
TRIGLYCERIDES: 200 mg/dL — AB (ref 0–149)
VLDL CHOLESTEROL CAL: 40 mg/dL (ref 5–40)

## 2017-03-11 ENCOUNTER — Encounter: Payer: Self-pay | Admitting: Family Medicine

## 2017-03-12 ENCOUNTER — Encounter: Payer: Self-pay | Admitting: Family Medicine

## 2017-03-12 DIAGNOSIS — R350 Frequency of micturition: Secondary | ICD-10-CM

## 2017-03-12 DIAGNOSIS — R3129 Other microscopic hematuria: Secondary | ICD-10-CM

## 2017-03-12 NOTE — Telephone Encounter (Signed)
All set!

## 2017-03-12 NOTE — Telephone Encounter (Signed)
Routing to referral coordinator.

## 2017-03-13 ENCOUNTER — Encounter: Payer: Self-pay | Admitting: Family Medicine

## 2017-03-18 ENCOUNTER — Encounter: Payer: Self-pay | Admitting: Urology

## 2017-03-18 ENCOUNTER — Encounter: Payer: Self-pay | Admitting: Family Medicine

## 2017-03-18 ENCOUNTER — Ambulatory Visit (INDEPENDENT_AMBULATORY_CARE_PROVIDER_SITE_OTHER): Payer: Medicaid Other | Admitting: Urology

## 2017-03-18 VITALS — BP 137/75 | HR 91 | Ht 64.0 in | Wt 194.4 lb

## 2017-03-18 DIAGNOSIS — N3020 Other chronic cystitis without hematuria: Secondary | ICD-10-CM | POA: Diagnosis not present

## 2017-03-18 DIAGNOSIS — R3129 Other microscopic hematuria: Secondary | ICD-10-CM | POA: Diagnosis not present

## 2017-03-18 DIAGNOSIS — N3289 Other specified disorders of bladder: Secondary | ICD-10-CM | POA: Diagnosis not present

## 2017-03-18 DIAGNOSIS — N200 Calculus of kidney: Secondary | ICD-10-CM

## 2017-03-18 LAB — URINALYSIS, COMPLETE
Bilirubin, UA: NEGATIVE
GLUCOSE, UA: NEGATIVE
Ketones, UA: NEGATIVE
LEUKOCYTES UA: NEGATIVE
Nitrite, UA: NEGATIVE
PROTEIN UA: NEGATIVE
Specific Gravity, UA: 1.01 (ref 1.005–1.030)
Urobilinogen, Ur: 0.2 mg/dL (ref 0.2–1.0)
pH, UA: 5 (ref 5.0–7.5)

## 2017-03-18 LAB — MICROSCOPIC EXAMINATION
Bacteria, UA: NONE SEEN
Epithelial Cells (non renal): NONE SEEN /hpf (ref 0–10)

## 2017-03-18 MED ORDER — CIPROFLOXACIN HCL 250 MG PO TABS: 250.0000 mg | ORAL_TABLET | Freq: Two times a day (BID) | ORAL | 0 refills | Status: DC

## 2017-03-18 NOTE — Progress Notes (Signed)
03/18/2017 8:46 AM   Jesus Guerrero 06-20-1971 [id number]  Referring provider: Valerie Roys, DO [place], [place] [address]  No chief complaint on file.   HPI: Jesus Guerrero 2016:    Jesus Guerrero is a 46 year old white male with autism who presented to the emergency room on 06/07/2015 with bilateral flank pain.  An abdominal  and pelvic CT with contrast was performed and a mild circumferential bladder thickening was noted.   He complains of a few months history of a weak dysuria and getting up 1-3 times a night to urinate.  He also has a remote history of spontaneous passage of a kidney stone.    Has had low grade positive c/s in the past   Today The patient had a negative workup for microscopic hematuria in 2016. He was cystoscoped under anesthesia with bilateral retrogrades.  He was told recently that he has microscopic hematuria again.  In the last 2 weeks he has nonspecific lower left back pain and mid back pain. He is getting up twice at night which is abnormal for him. He voids every 3 or 4 hours during the day. He might have a little bit of intermittent dysuria. He thinks he may have passed a stone in 2013  Modifying factors: There are no other modifying factors  Associated signs and symptoms: There are no other associated signs and symptoms Aggravating and relieving factors: There are no other aggravating or relieving factors Severity: Mild Duration: Persistent   PMH: Past Medical History:  Diagnosis Date  . Autism   . Constipation   . GERD (gastroesophageal reflux disease)   . Gout   . High cholesterol   . History of kidney stones   . Overweight   . Prostatitis   . Suspicious nevus   . UTI (lower urinary tract infection)     Surgical History: Past Surgical History:  Procedure Laterality Date  . CHOLECYSTECTOMY  2014  . CYSTOSCOPY W/ RETROGRADES Bilateral 06/29/2015   Procedure: CYSTOSCOPY WITH RETROGRADE PYELOGRAM;  Surgeon: Hollice Espy, MD;   Location: [facility] ORS;  Service: Urology;  Laterality: Bilateral;    Home Medications:  Allergies as of 03/18/2017      Reactions   Bactrim [sulfamethoxazole-trimethoprim] Rash   Codeine Swelling      Medication List       Accurate as of 03/18/17  8:46 AM. Always use your most recent med list.          acetaminophen 500 MG tablet Commonly known as:  TYLENOL Take 500 mg by mouth every 6 (six) hours as needed.   clotrimazole-betamethasone cream Commonly known as:  LOTRISONE Apply 1 application topically 2 (two) times daily.   hydrOXYzine 10 MG tablet Commonly known as:  ATARAX/VISTARIL Take 1 tablet (10 mg total) by mouth at bedtime as needed for itching.   ketoconazole 2 % cream Commonly known as:  NIZORAL Apply 1 application topically 2 (two) times daily.   loratadine 10 MG tablet Commonly known as:  CLARITIN Take 10 mg by mouth daily.   meloxicam 15 MG tablet Commonly known as:  MOBIC Take 1 tablet (15 mg total) by mouth daily.   NONFORMULARY OR COMPOUNDED ITEM Apply 1-2 g topically 4 (four) times daily.       Allergies:  Allergies  Allergen Reactions  . Bactrim [Sulfamethoxazole-Trimethoprim] Rash  . Codeine Swelling    Family History: Family History  Problem Relation Age of Onset  . Congestive Heart Failure Father   .  Kidney failure Father   . Hematuria Neg Hx   . Kidney Stones Neg Hx   . Prostate cancer Neg Hx     Social History:  reports that he has never smoked. He has never used smokeless tobacco. He reports that he does not drink alcohol or use drugs.  ROS: UROLOGY Frequent Urination?: Yes Hard to postpone urination?: No Burning/pain with urination?: Yes Get up at night to urinate?: Yes Leakage of urine?: No Urine stream starts and stops?: No Trouble starting stream?: No Do you have to strain to urinate?: No Blood in urine?: Yes Urinary tract infection?: No Sexually transmitted disease?: No Injury to kidneys or bladder?: No Painful  intercourse?: No Weak stream?: No Erection problems?: No Penile pain?: No                                      Physical Exam: BP 137/75   Pulse 91   Ht 5\' 4"  (1.626 m)   Wt 88.2 kg (194 lb 6.4 oz)   BMI 33.37 kg/m   Constitutional:  Alert and oriented, No acute distress. HEENT: [place] AT, moist mucus membranes.  Trachea midline, no masses. Cardiovascular: No clubbing, cyanosis, or edema. Respiratory: Normal respiratory effort, no increased work of breathing. GI: Abdomen is soft, nontender, nondistended, no abdominal masses GU: No CVA tenderness. Non toxic Skin: No rashes, bruises or suspicious lesions. Lymph: No cervical or inguinal adenopathy. Neurologic: Grossly intact, no focal deficits, moving all 4 extremities. Psychiatric: Normal mood and affect.  Laboratory Data: Lab Results  Component Value Date   WBC 7.0 03/07/2017   HGB 13.7 06/29/2015   HCT 44.2 03/07/2017   MCV 88 03/07/2017   PLT 229 03/07/2017    Lab Results  Component Value Date   CREATININE 1.09 03/07/2017    No results found for: PSA  No results found for: TESTOSTERONE  No results found for: HGBA1C  Urinalysis    Component Value Date/Time   COLORURINE STRAW (A) 06/07/2015 2045   APPEARANCEUR Clear 03/07/2017 0816   LABSPEC 1.002 (L) 06/07/2015 2045   LABSPEC 1.005 11/30/2014 1342   PHURINE 7.0 06/07/2015 2045   GLUCOSEU Negative 03/07/2017 0816   GLUCOSEU Negative 11/30/2014 1342   HGBUR 2+ (A) 06/07/2015 2045   BILIRUBINUR Negative 03/07/2017 0816   BILIRUBINUR Negative 11/30/2014 1342   KETONESUR 1+ (A) 06/07/2015 2045   PROTEINUR Negative 03/07/2017 0816   PROTEINUR NEGATIVE 06/07/2015 2045   NITRITE Negative 03/07/2017 0816   NITRITE NEGATIVE 06/07/2015 2045   LEUKOCYTESUR Negative 03/07/2017 0816   LEUKOCYTESUR Negative 11/30/2014 1342    Pertinent Imaging: none  Assessment & Plan:  The patient has mild burning and mild nighttime frequency. The urinalysis  was normal today but I sent it for culture. I thought it was reasonable to get a renal ultrasound and empirically treat him for urinary tract infection  Ciprofloxacin sent. Screening renal ultrasound this week ordered. Reevaluate in approximately 3 weeks  1. Bladder wall thickening  - Urinalysis, Complete - CULTURE, URINE COMPREHENSIVE  2. Microscopic hematuria  - Urinalysis, Complete - CULTURE, URINE COMPREHENSIVE   No Follow-up on file.  Reece Packer, MD  [facility] [address] [place], [place] [address] [phone]

## 2017-03-20 ENCOUNTER — Encounter: Payer: Self-pay | Admitting: Family Medicine

## 2017-03-20 LAB — CULTURE, URINE COMPREHENSIVE

## 2017-03-24 ENCOUNTER — Encounter: Payer: Self-pay | Admitting: Family Medicine

## 2017-03-27 ENCOUNTER — Ambulatory Visit
Admission: RE | Admit: 2017-03-27 | Discharge: 2017-03-27 | Disposition: A | Discharge status: Home / Self Care | Payer: Medicaid Other | Source: Ambulatory Visit | Attending: Urology | Admitting: Urology

## 2017-03-27 DIAGNOSIS — N200 Calculus of kidney: Secondary | ICD-10-CM | POA: Insufficient documentation

## 2017-04-02 ENCOUNTER — Encounter: Payer: Self-pay | Admitting: Family Medicine

## 2017-04-07 ENCOUNTER — Encounter: Payer: Self-pay | Admitting: Family Medicine

## 2017-04-15 ENCOUNTER — Ambulatory Visit (INDEPENDENT_AMBULATORY_CARE_PROVIDER_SITE_OTHER): Payer: Medicaid Other | Admitting: Podiatry

## 2017-04-15 ENCOUNTER — Encounter: Payer: Self-pay | Admitting: Podiatry

## 2017-04-15 DIAGNOSIS — M779 Enthesopathy, unspecified: Secondary | ICD-10-CM

## 2017-04-15 DIAGNOSIS — M7750 Other enthesopathy of unspecified foot: Secondary | ICD-10-CM

## 2017-04-15 NOTE — Progress Notes (Signed)
He presents today states that he is about 75% better. He states that he gets improvement with these injection.  Objective: Vital signs are stable he is alert and oriented 3 still has some tenderness on palpation of the sinus tarsi but then much more superficial than previously noted. He has less pain on palpation of the first metatarsophalangeal joint left foot.  Assessment: Capsulitis subtalar joint bilateral. Capsulitis first metatarsophalangeal joint left.  Plan: Dexamethasone injections were injected into each one of these joints after sterile Betadine skin prep. Follow up with him in 10 weeks.

## 2017-04-16 ENCOUNTER — Ambulatory Visit (INDEPENDENT_AMBULATORY_CARE_PROVIDER_SITE_OTHER): Payer: Medicaid Other | Admitting: Urology

## 2017-04-16 ENCOUNTER — Encounter: Payer: Self-pay | Admitting: Urology

## 2017-04-16 VITALS — BP 128/73 | HR 80 | Ht 64.0 in | Wt 191.8 lb

## 2017-04-16 DIAGNOSIS — N3289 Other specified disorders of bladder: Secondary | ICD-10-CM | POA: Diagnosis not present

## 2017-04-16 DIAGNOSIS — R3129 Other microscopic hematuria: Secondary | ICD-10-CM | POA: Diagnosis not present

## 2017-04-16 DIAGNOSIS — R351 Nocturia: Secondary | ICD-10-CM

## 2017-04-16 LAB — URINALYSIS, COMPLETE
Bilirubin, UA: NEGATIVE
Glucose, UA: NEGATIVE
Ketones, UA: NEGATIVE
Leukocytes, UA: NEGATIVE
NITRITE UA: NEGATIVE
Protein, UA: NEGATIVE
Specific Gravity, UA: 1.025 (ref 1.005–1.030)
UUROB: 0.2 mg/dL (ref 0.2–1.0)
pH, UA: 5 (ref 5.0–7.5)

## 2017-04-16 LAB — MICROSCOPIC EXAMINATION
Bacteria, UA: NONE SEEN
Epithelial Cells (non renal): NONE SEEN /hpf (ref 0–10)
WBC UA: NONE SEEN /HPF (ref 0–?)

## 2017-04-16 NOTE — Progress Notes (Signed)
04/16/2017 3:07 PM   Jesus Guerrero [birth year] [id number]  Referring provider: Valerie Roys, DO [place], [place] [address]  Chief Complaint  Patient presents with  . Follow-up    bladder wall thickening , hematuria    HPI:  F/u back pain and LUTS. Seen Mar 2018 with 2 weeks he has nonspecific lower left back pain and mid back pain. He is getting up twice at night which is normal for him. He voids every 3 or 4 hours during the day. UA was clear. He might have a little bit of intermittent dysuria.   He was seen in 2016 for MH, nocturia x 1-3 and dysuria - similar symptoms. CT was performed and a mild circumferential bladder thickening was noted. He was cystoscoped under anesthesia with bilateral retrogrades that were normal.   He thinks he may have passed a stone in 2013.   Today, pt is seen for the above. Renal US showed no hydro, stone or mass. 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 Recent bun was 26 and cr 1.09. Blood pressure 128/73 which is at his baseline looking back.. No BP meds or h/o HTN (RAS was mentioned on the renal u/s). Looking back at 2016 CT, Rt kidney is smaller than left, so this is a stable finding.   UA today clear.  He's been doing well. Still some low back pain. Looking back he did not have [place] Mar 2018 (there was blood on dip, not under microscope).    PMH: Past Medical History:  Diagnosis Date  . Autism   . Constipation   . GERD (gastroesophageal reflux disease)   . Gout   . High cholesterol   . History of kidney stones   . Overweight   . Prostatitis   . Suspicious nevus   . UTI (lower urinary tract infection)     Surgical History: Past Surgical History:  Procedure Laterality Date  . CHOLECYSTECTOMY  2014  . CYSTOSCOPY W/ RETROGRADES Bilateral 06/29/2015   Procedure: CYSTOSCOPY WITH RETROGRADE PYELOGRAM;  Surgeon: Hollice Espy, MD;  Location: [facility] ORS;  Service: Urology;  Laterality: Bilateral;    Home  Medications:  Allergies as of 04/16/2017      Reactions   Bactrim [sulfamethoxazole-trimethoprim] Rash   Codeine Swelling      Medication List       Accurate as of 04/16/17  3:07 PM. Always use your most recent med list.          acetaminophen 500 MG tablet Commonly known as:  TYLENOL Take 500 mg by mouth every 6 (six) hours as needed.   clotrimazole-betamethasone cream Commonly known as:  LOTRISONE Apply 1 application topically 2 (two) times daily.   hydrOXYzine 10 MG tablet Commonly known as:  ATARAX/VISTARIL Take 1 tablet (10 mg total) by mouth at bedtime as needed for itching.   ketoconazole 2 % cream Commonly known as:  NIZORAL Apply 1 application topically 2 (two) times daily.   loratadine 10 MG tablet Commonly known as:  CLARITIN Take 10 mg by mouth daily.   meloxicam 15 MG tablet Commonly known as:  MOBIC Take 1 tablet (15 mg total) by mouth daily.   NONFORMULARY OR COMPOUNDED ITEM Apply 1-2 g topically 4 (four) times daily.       Allergies:  Allergies  Allergen Reactions  . Bactrim [Sulfamethoxazole-Trimethoprim] Rash  . Codeine Swelling    Family History: Family History  Problem Relation Age of Onset  .  Congestive Heart Failure Father   . Kidney failure Father   . Hematuria Neg Hx   . Kidney Stones Neg Hx   . Prostate cancer Neg Hx     Social History:  reports that he has never smoked. He has never used smokeless tobacco. He reports that he does not drink alcohol or use drugs.  ROS: UROLOGY Frequent Urination?: Yes Hard to postpone urination?: No Burning/pain with urination?: No Get up at night to urinate?: Yes Leakage of urine?: No Urine stream starts and stops?: No Trouble starting stream?: No Do you have to strain to urinate?: No Blood in urine?: Yes Urinary tract infection?: Yes Sexually transmitted disease?: No Injury to kidneys or bladder?: No Painful intercourse?: No Weak stream?: No Erection problems?: No Penile pain?:  No  Gastrointestinal Nausea?: No Vomiting?: No Indigestion/heartburn?: No Diarrhea?: No Constipation?: No  Constitutional Fever: No Night sweats?: No Weight loss?: No Fatigue?: No  Skin Skin rash/lesions?: No Itching?: No  Eyes Blurred vision?: No Double vision?: No  Ears/Nose/Throat Sore throat?: No Sinus problems?: Yes  Hematologic/Lymphatic Swollen glands?: No Easy bruising?: No  Cardiovascular Leg swelling?: No Chest pain?: No  Respiratory Cough?: No Shortness of breath?: No  Endocrine Excessive thirst?: No  Musculoskeletal Back pain?: Yes Joint pain?: No  Neurological Headaches?: No Dizziness?: No  Psychologic Depression?: No Anxiety?: No  Physical Exam: BP 128/73   Pulse 80   Ht 5\' 4"  (1.626 m)   Wt 87 kg (191 lb 12.8 oz)   BMI 32.92 kg/m   Constitutional:  Alert and oriented, No acute distress. HEENT: [place] AT, moist mucus membranes.  Trachea midline, no masses. Cardiovascular: No clubbing, cyanosis, or edema. Respiratory: Normal respiratory effort, no increased work of breathing. GI: Abdomen is soft, nontender, nondistended, no abdominal masses GU: No CVA tenderness.  Skin: No rashes, bruises or suspicious lesions. Neurologic: Grossly intact, no focal deficits, moving all 4 extremities. Psychiatric: Normal mood and affect.  Laboratory Data: Lab Results  Component Value Date   WBC 7.0 03/07/2017   HGB 13.7 06/29/2015   HCT 44.2 03/07/2017   MCV 88 03/07/2017   PLT 229 03/07/2017    Lab Results  Component Value Date   CREATININE 1.09 03/07/2017    No results found for: PSA  No results found for: TESTOSTERONE  No results found for: HGBA1C  Urinalysis    Component Value Date/Time   COLORURINE STRAW (A) 06/07/2015 2045   APPEARANCEUR Clear 03/18/2017 0820   LABSPEC 1.002 (L) 06/07/2015 2045   LABSPEC 1.005 11/30/2014 1342   PHURINE 7.0 06/07/2015 2045   GLUCOSEU Negative 03/18/2017 0820   GLUCOSEU Negative 11/30/2014  1342   HGBUR 2+ (A) 06/07/2015 2045   BILIRUBINUR Negative 03/18/2017 0820   BILIRUBINUR Negative 11/30/2014 1342   KETONESUR 1+ (A) 06/07/2015 2045   PROTEINUR Negative 03/18/2017 0820   PROTEINUR NEGATIVE 06/07/2015 2045   NITRITE Negative 03/18/2017 0820   NITRITE NEGATIVE 06/07/2015 2045   LEUKOCYTESUR Negative 03/18/2017 0820   LEUKOCYTESUR Negative 11/30/2014 1342    Pertinent Imaging: Renal US and CT  Assessment & Plan:    1. Microscopic hematuria - improved, resolved  - Urinalysis, Complete  2. Bladder wall thickening Prior eval negative   3. Back pain - cleared from a GU pt of view. See PCP if it continues.   4. Dysuria, nocturia - stable   See in 1 year for UA, H&P   No Follow-up on file.  Festus Aloe, [facility] [address]  [address] [place], [place] [address] [phone]

## 2017-05-05 ENCOUNTER — Encounter: Payer: Self-pay | Admitting: Family Medicine

## 2017-05-06 ENCOUNTER — Encounter: Payer: Self-pay | Admitting: Family Medicine

## 2017-05-06 NOTE — Telephone Encounter (Signed)
Tried to call patient, no answer, left message for patient to return my call and schedule an appointment.

## 2017-05-07 ENCOUNTER — Encounter: Payer: Self-pay | Admitting: Family Medicine

## 2017-05-07 ENCOUNTER — Ambulatory Visit (INDEPENDENT_AMBULATORY_CARE_PROVIDER_SITE_OTHER): Payer: Medicaid Other | Admitting: Family Medicine

## 2017-05-07 VITALS — BP 124/75 | HR 85 | Temp 98.6°F | Wt 189.0 lb

## 2017-05-07 DIAGNOSIS — B9789 Other viral agents as the cause of diseases classified elsewhere: Secondary | ICD-10-CM

## 2017-05-07 DIAGNOSIS — J069 Acute upper respiratory infection, unspecified: Secondary | ICD-10-CM | POA: Diagnosis not present

## 2017-05-07 MED ORDER — FLUTICASONE PROPIONATE 50 MCG/ACT NA SUSP: 2.0000 | Freq: Every day | NASAL | 6 refills | Status: DC

## 2017-05-07 MED ORDER — BENZONATATE 100 MG PO CAPS: 200.0000 mg | ORAL_CAPSULE | Freq: Three times a day (TID) | ORAL | 0 refills | Status: DC | PRN

## 2017-05-07 NOTE — Patient Instructions (Signed)
Follow up as needed

## 2017-05-07 NOTE — Progress Notes (Signed)
   BP 124/75   Pulse 85   Temp 98.6 F (37 C)   Wt 189 lb (85.7 kg)   SpO2 96%   BMI 32.44 kg/m    Subjective:    Patient ID: Jesus Guerrero, male    DOB: Aug 01, 1971, 46 y.o.   MRN: [id number]  HPI: Jesus Guerrero is a 46 y.o. male  Chief Complaint  Patient presents with  . URI    x 3 days, head/chest congestion, productive cough, runny nose, felt feverish, some body aches. No sore throat, no ear ache.   Patient presents with 3 day hx of congestion, subjective fevers, sinus pressure, itchy eyes, and productive cough. Denies ear pain, sore throat, wheezing, SOB. Has been taking allegra off and on and alka seltzer plus with some relief. States cough is most bothersome symptom, has been keeping him up at night. Multiple sick contacts at home.    Relevant past medical, surgical, family and social history reviewed and updated as indicated. Interim medical history since our last visit reviewed. Allergies and medications reviewed and updated.  Review of Systems  Constitutional: Positive for fever.  HENT: Positive for congestion and sinus pressure.   Eyes: Positive for itching.  Respiratory: Positive for cough.   Cardiovascular: Negative.   Gastrointestinal: Negative.   Genitourinary: Negative.   Musculoskeletal: Negative.   Neurological: Negative.   Psychiatric/Behavioral: Negative.     Per HPI unless specifically indicated above     Objective:    BP 124/75   Pulse 85   Temp 98.6 F (37 C)   Wt 189 lb (85.7 kg)   SpO2 96%   BMI 32.44 kg/m   Wt Readings from Last 3 Encounters:  05/07/17 189 lb (85.7 kg)  04/16/17 191 lb 12.8 oz (87 kg)  03/18/17 194 lb 6.4 oz (88.2 kg)    Physical Exam  Constitutional: He is oriented to person, place, and time. He appears well-developed and well-nourished. No distress.  HENT:  Head: Atraumatic.  Nasal mucosa injected and slightly boggy Oropharynx erythematous without exudates  Eyes: Conjunctivae are normal. Pupils are equal,  round, and reactive to light.  Neck: Normal range of motion. Neck supple.  Cardiovascular: Normal rate and intact distal pulses.   Pulmonary/Chest: Effort normal and breath sounds normal. No respiratory distress.  Musculoskeletal: Normal range of motion.  Neurological: He is alert and oriented to person, place, and time.  Skin: Skin is warm and dry.  Psychiatric: He has a normal mood and affect. His behavior is normal.  Nursing note and vitals reviewed.     Assessment & Plan:   Problem List Items Addressed This Visit    None    Visit Diagnoses    Viral URI with cough    -  Primary   Continue alka setlzer and allergy tab, add flonase and tessalon perles. Given codeine allergy, will forgo tussionex. Delsym samples given for prn use.        Follow up plan: Return if symptoms worsen or fail to improve.

## 2017-05-25 ENCOUNTER — Encounter: Payer: Self-pay | Admitting: Family Medicine

## 2017-05-28 ENCOUNTER — Telehealth: Payer: Self-pay | Admitting: Family Medicine

## 2017-05-28 NOTE — Telephone Encounter (Signed)
Patient saw eye doctor and his headaches have resolved.

## 2017-05-28 NOTE — Telephone Encounter (Signed)
Patient has went to the eye doctor and he seems to be doing better. Will call and schedule an appointment if he has further issues.

## 2017-06-11 ENCOUNTER — Encounter: Payer: Self-pay | Admitting: Family Medicine

## 2017-06-11 NOTE — Telephone Encounter (Signed)
Routing to provider  

## 2017-06-18 ENCOUNTER — Ambulatory Visit (INDEPENDENT_AMBULATORY_CARE_PROVIDER_SITE_OTHER): Payer: Medicaid Other | Admitting: Family Medicine

## 2017-06-18 ENCOUNTER — Encounter: Payer: Self-pay | Admitting: Family Medicine

## 2017-06-18 VITALS — BP 121/72 | HR 78 | Temp 97.8°F | Wt 187.8 lb

## 2017-06-18 DIAGNOSIS — E782 Mixed hyperlipidemia: Secondary | ICD-10-CM

## 2017-06-18 NOTE — Progress Notes (Signed)
BP 121/72 (BP Location: Left Arm, Patient Position: Sitting, Cuff Size: Large)   Pulse 78   Temp 97.8 F (36.6 C)   Wt 187 lb 12.8 oz (85.2 kg)   SpO2 98%   BMI 32.24 kg/m    Subjective:    Patient ID: KAMERAN MCNEESE, male    DOB: [birth year], 46 y.o.   MRN: [id number]  HPI: TYAIRE ODEM is a 46 y.o. male  Chief Complaint  Patient presents with  . Hyperlipidemia   HYPERLIPIDEMIA Hyperlipidemia status: fair compliance- lives in halfway house, so food choices limited Satisfied with current treatment?  yes Side effects:  Not on anything Past cholesterol meds: none Supplements: none Aspirin:  no The 10-year ASCVD risk score [name], et al., 2013) is: 2.7%   Values used to calculate the score:     Age: 54 years     Sex: Male     Is Non-Hispanic African American: No     Diabetic: No     Tobacco smoker: No     Systolic Blood Pressure: 505 mmHg     Is BP treated: No     HDL Cholesterol: 46 mg/dL     Total Cholesterol: 208 mg/dL Chest pain:  no Coronary artery disease:  no Family history CAD:  no  Relevant past medical, surgical, family and social history reviewed and updated as indicated. Interim medical history since our last visit reviewed. Allergies and medications reviewed and updated.  Review of Systems  Constitutional: Negative.   Respiratory: Negative.   Cardiovascular: Negative.   Psychiatric/Behavioral: Negative.     Per HPI unless specifically indicated above     Objective:    BP 121/72 (BP Location: Left Arm, Patient Position: Sitting, Cuff Size: Large)   Pulse 78   Temp 97.8 F (36.6 C)   Wt 187 lb 12.8 oz (85.2 kg)   SpO2 98%   BMI 32.24 kg/m   Wt Readings from Last 3 Encounters:  06/18/17 187 lb 12.8 oz (85.2 kg)  05/07/17 189 lb (85.7 kg)  04/16/17 191 lb 12.8 oz (87 kg)    Physical Exam  Constitutional: He is oriented to person, place, and time. He appears well-developed and well-nourished. No distress.  HENT:  Head:  Normocephalic and atraumatic.  Right Ear: Hearing normal.  Left Ear: Hearing normal.  Nose: Nose normal.  Eyes: Conjunctivae and lids are normal. Right eye exhibits no discharge. Left eye exhibits no discharge. No scleral icterus.  Cardiovascular: Normal rate, regular rhythm, normal heart sounds and intact distal pulses.  Exam reveals no gallop and no friction rub.   No murmur heard. Pulmonary/Chest: Effort normal and breath sounds normal. No respiratory distress. He has no wheezes. He has no rales. He exhibits no tenderness.  Musculoskeletal: Normal range of motion.  Neurological: He is alert and oriented to person, place, and time.  Skin: Skin is warm, dry and intact. No rash noted. He is not diaphoretic. No erythema. No pallor.  Psychiatric: He has a normal mood and affect. His speech is normal and behavior is normal. Judgment and thought content normal. Cognition and memory are normal.  Nursing note and vitals reviewed.   Results for orders placed or performed in visit on 04/16/17  Microscopic Examination  Result Value Ref Range   WBC, UA None seen 0 - 5 /hpf   RBC, UA 0-2 0 - 2 /hpf   Epithelial Cells (non renal) None seen 0 - 10 /hpf   Bacteria, UA None  seen None seen/Few  Urinalysis, Complete  Result Value Ref Range   Specific Gravity, UA 1.025 1.005 - 1.030   pH, UA 5.0 5.0 - 7.5   Color, UA Yellow Yellow   Appearance Ur Clear Clear   Leukocytes, UA Negative Negative   Protein, UA Negative Negative/Trace   Glucose, UA Negative Negative   Ketones, UA Negative Negative   RBC, UA 1+ (A) Negative   Bilirubin, UA Negative Negative   Urobilinogen, Ur 0.2 0.2 - 1.0 mg/dL   Nitrite, UA Negative Negative   Microscopic Examination See below:       Assessment & Plan:   Problem List Items Addressed This Visit      Other   Hyperlipidemia - Primary    Rechecking levels today. Await results. Continue diet and exercise.       Relevant Orders   Lipid Panel w/o Chol/HDL Ratio         Follow up plan: Return in about 3 months (around 09/18/2017) for Follow up.

## 2017-06-18 NOTE — Assessment & Plan Note (Signed)
Rechecking levels today. Await results. Continue diet and exercise.  

## 2017-06-19 ENCOUNTER — Ambulatory Visit (INDEPENDENT_AMBULATORY_CARE_PROVIDER_SITE_OTHER): Payer: Medicaid Other | Admitting: Podiatry

## 2017-06-19 ENCOUNTER — Encounter: Payer: Self-pay | Admitting: Podiatry

## 2017-06-19 DIAGNOSIS — M779 Enthesopathy, unspecified: Secondary | ICD-10-CM

## 2017-06-19 DIAGNOSIS — M7750 Other enthesopathy of unspecified foot: Secondary | ICD-10-CM

## 2017-06-19 LAB — LIPID PANEL W/O CHOL/HDL RATIO
CHOLESTEROL TOTAL: 244 mg/dL — AB (ref 100–199)
HDL: 42 mg/dL (ref >39)
LDL CALC: 144 mg/dL — AB (ref 0–99)
TRIGLYCERIDES: 290 mg/dL — AB (ref 0–149)
VLDL CHOLESTEROL CAL: 58 mg/dL — AB (ref 5–40)

## 2017-06-19 NOTE — Progress Notes (Signed)
He presents today for follow-up of his capsulitis subtalar joints bilateral and first metatarsophalangeal joint left foot. He states that everything seems to be doing better but he like another set of injections today just to make sure that he goes on to heal.  Objective: Tenderness on N range of motion of the subtalar joint and on palpation of the sinus tarsi bilaterally. He also has some tenderness on palpation of the first metatarsophalangeal joint left none on the right.  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  Plan: I injected all of the spaces today with Kenalog and local anesthetic subtalar joint and dexamethasone first metatarsophalangeal joint after sterile Betadine skin prep. Follow-up with me in 3 months.

## 2017-06-23 ENCOUNTER — Encounter: Payer: Self-pay | Admitting: Family Medicine

## 2017-06-29 ENCOUNTER — Encounter: Payer: Self-pay | Admitting: Family Medicine

## 2017-06-30 ENCOUNTER — Encounter: Payer: Self-pay | Admitting: Family Medicine

## 2017-07-01 MED ORDER — HYDROXYZINE HCL 10 MG PO TABS: 10.0000 mg | ORAL_TABLET | Freq: Every evening | ORAL | 6 refills | Status: DC | PRN

## 2017-07-15 ENCOUNTER — Encounter: Payer: Self-pay | Admitting: Family Medicine

## 2017-07-17 ENCOUNTER — Telehealth: Payer: Self-pay

## 2017-07-17 NOTE — Telephone Encounter (Signed)
Called and left patient a VM about scheduling appointment regarding the rash he states he has (see patient emails in chart review). I asked for him to please give [place] a call to schedule an appointment when he could.

## 2017-07-19 ENCOUNTER — Encounter: Payer: Self-pay | Admitting: Family Medicine

## 2017-07-24 ENCOUNTER — Encounter: Payer: Self-pay | Admitting: Family Medicine

## 2017-07-25 ENCOUNTER — Encounter: Payer: Self-pay | Admitting: Family Medicine

## 2017-08-03 ENCOUNTER — Encounter: Payer: Self-pay | Admitting: Family Medicine

## 2017-08-04 ENCOUNTER — Encounter: Payer: Self-pay | Admitting: Family Medicine

## 2017-08-06 ENCOUNTER — Encounter: Payer: Self-pay | Admitting: Family Medicine

## 2017-08-06 ENCOUNTER — Ambulatory Visit (INDEPENDENT_AMBULATORY_CARE_PROVIDER_SITE_OTHER): Payer: Medicaid Other | Admitting: Family Medicine

## 2017-08-06 VITALS — BP 133/74 | HR 83 | Temp 98.5°F | Wt 184.3 lb

## 2017-08-06 DIAGNOSIS — R1031 Right lower quadrant pain: Secondary | ICD-10-CM

## 2017-08-06 LAB — CBC WITH DIFFERENTIAL/PLATELET
HEMATOCRIT: 44.8 % (ref 37.5–51.0)
HEMOGLOBIN: 15.2 g/dL (ref 13.0–17.7)
Lymphocytes Absolute: 1.5 10*3/uL (ref 0.7–3.1)
Lymphs: 23 %
MCH: 30.2 pg (ref 26.6–33.0)
MCHC: 33.9 g/dL (ref 31.5–35.7)
MCV: 89 fL (ref 79–97)
MID (Absolute): 0.3 10*3/uL (ref 0.1–1.6)
MID: 4 %
NEUTROS PCT: 73 %
Neutrophils Absolute: 4.9 10*3/uL (ref 1.4–7.0)
Platelets: 206 10*3/uL (ref 150–379)
RBC: 5.03 x10E6/uL (ref 4.14–5.80)
RDW: 13.6 % (ref 12.3–15.4)
WBC: 6.7 10*3/uL (ref 3.4–10.8)

## 2017-08-06 LAB — UA/M W/RFLX CULTURE, ROUTINE
BILIRUBIN UA: NEGATIVE
Glucose, UA: NEGATIVE
Ketones, UA: NEGATIVE
LEUKOCYTES UA: NEGATIVE
NITRITE UA: NEGATIVE
PH UA: 5.5 (ref 5.0–7.5)
Protein, UA: NEGATIVE
SPEC GRAV UA: 1.02 (ref 1.005–1.030)
Urobilinogen, Ur: 0.2 mg/dL (ref 0.2–1.0)

## 2017-08-06 LAB — MICROSCOPIC EXAMINATION: WBC, UA: NONE SEEN /hpf (ref 0–?)

## 2017-08-06 MED ORDER — DICYCLOMINE HCL 10 MG PO CAPS: 10.0000 mg | ORAL_CAPSULE | Freq: Three times a day (TID) | ORAL | 0 refills | Status: DC

## 2017-08-06 NOTE — Progress Notes (Signed)
BP 133/74 (BP Location: Left Arm, Patient Position: Sitting, Cuff Size: Normal)   Pulse 83   Temp 98.5 F (36.9 C)   Wt 184 lb 5 oz (83.6 kg)   SpO2 98%   BMI 31.64 kg/m    Subjective:    Patient ID: Jesus Guerrero, male    DOB: Jun 15, 1971, 46 y.o.   MRN: [id number]  HPI: Jesus Guerrero is a 46 y.o. male  Chief Complaint  Patient presents with  . Abdominal Pain    Patient states that he had a stomach virus over the weekend, now he is having sharpe stabbing pains in his lower right abdomen whenn he eats.    ABDOMINAL PAIN  Duration: Saturday Onset: sudden Severity: moderate to severe Quality: sharp then dull Location:  RLQ  Episode duration: constant Radiation: no Frequency: constant Alleviating factors: nothing Aggravating factors: eating Status: stable Treatments attempted: antacids Fever: no Nausea: no Vomiting: yes- not Since Saturday Weight loss: no Decreased appetite: yes Diarrhea: no Constipation: yes Blood in stool: no Heartburn: no Jaundice: no Rash: no Dysuria/urinary frequency: no Hematuria: no History of sexually transmitted disease: no Recurrent NSAID use: no  Relevant past medical, surgical, family and social history reviewed and updated as indicated. Interim medical history since our last visit reviewed. Allergies and medications reviewed and updated.  Review of Systems  Constitutional: Negative.   Respiratory: Negative.   Cardiovascular: Negative.   Gastrointestinal: Positive for abdominal pain and constipation. Negative for abdominal distention, anal bleeding, blood in stool, diarrhea, nausea, rectal pain and vomiting.  Genitourinary: Negative.   Psychiatric/Behavioral: Negative.     Per HPI unless specifically indicated above     Objective:    BP 133/74 (BP Location: Left Arm, Patient Position: Sitting, Cuff Size: Normal)   Pulse 83   Temp 98.5 F (36.9 C)   Wt 184 lb 5 oz (83.6 kg)   SpO2 98%   BMI 31.64 kg/m   Wt  Readings from Last 3 Encounters:  08/06/17 184 lb 5 oz (83.6 kg)  06/18/17 187 lb 12.8 oz (85.2 kg)  05/07/17 189 lb (85.7 kg)    Physical Exam  Constitutional: He is oriented to person, place, and time. He appears well-developed and well-nourished. No distress.  HENT:  Head: Normocephalic and atraumatic.  Right Ear: Hearing normal.  Left Ear: Hearing normal.  Nose: Nose normal.  Eyes: Conjunctivae and lids are normal. Right eye exhibits no discharge. Left eye exhibits no discharge. No scleral icterus.  Cardiovascular: Normal rate, regular rhythm, normal heart sounds and intact distal pulses.  Exam reveals no gallop and no friction rub.   No murmur heard. Pulmonary/Chest: Effort normal and breath sounds normal. No respiratory distress. He has no wheezes. He has no rales. He exhibits no tenderness.  Abdominal: Soft. Bowel sounds are normal. He exhibits no distension and no mass. There is generalized tenderness. There is no rebound and no guarding.  Vaguely diffusely tender with some increased tenderness RLQ  Musculoskeletal: Normal range of motion.  Neurological: He is alert and oriented to person, place, and time.  Skin: Skin is warm, dry and intact. No rash noted. He is not diaphoretic. No erythema. No pallor.  Psychiatric: He has a normal mood and affect. His speech is normal and behavior is normal. Judgment and thought content normal. Cognition and memory are normal.  Nursing note and vitals reviewed.   Results for orders placed or performed in visit on 06/18/17  Lipid Panel w/o Chol/HDL Ratio  Result  Value Ref Range   Cholesterol, Total 244 (H) 100 - 199 mg/dL   Triglycerides 290 (H) 0 - 149 mg/dL   HDL 42 >39 mg/dL   VLDL Cholesterol Cal 58 (H) 5 - 40 mg/dL   LDL Calculated 144 (H) 0 - 99 mg/dL      Assessment & Plan:   Problem List Items Addressed This Visit    None    Visit Diagnoses    RLQ abdominal pain    -  Primary   Likely from viral GI bug over the weekend. UA  and CBC normal. Start bentyl. Will see back in 2-3 days to make sure he's getting better.   Relevant Orders   CBC With Differential/Platelet   UA/M w/rflx Culture, Routine       Follow up plan: Return 2-3 days, for Follow up belly pain.

## 2017-08-07 ENCOUNTER — Encounter: Payer: Self-pay | Admitting: Family Medicine

## 2017-08-09 ENCOUNTER — Encounter: Payer: Self-pay | Admitting: Family Medicine

## 2017-08-09 ENCOUNTER — Ambulatory Visit (INDEPENDENT_AMBULATORY_CARE_PROVIDER_SITE_OTHER): Payer: Medicaid Other | Admitting: Family Medicine

## 2017-08-09 VITALS — BP 125/73 | HR 79 | Temp 97.6°F | Wt 185.2 lb

## 2017-08-09 DIAGNOSIS — R42 Dizziness and giddiness: Secondary | ICD-10-CM | POA: Diagnosis not present

## 2017-08-09 DIAGNOSIS — R1031 Right lower quadrant pain: Secondary | ICD-10-CM

## 2017-08-09 NOTE — Progress Notes (Signed)
BP 125/73 (BP Location: Left Arm, Patient Position: Sitting, Cuff Size: Normal)   Pulse 79   Temp 97.6 F (36.4 C)   Wt 185 lb 4 oz (84 kg)   SpO2 98%   BMI 31.80 kg/m    Subjective:    Patient ID: Jesus Guerrero, male    DOB: 1971-12-16, 46 y.o.   MRN: [id number]  HPI: [name] is a 46 y.o. male  Chief Complaint  Patient presents with  . Abdominal Pain  . Dizziness   Riese notes that his belly is better! He is feeling more like himself. No pain. Tolerating the bentyl well. He does note that he was a little dizzy yesterday- mainly light headed. He did not drink a lot of fluids and was out in the heat. He is feeling better today and not having any problems with dizziness today. Otherwise doing well with no other concerns or complaints at this time.   Relevant past medical, surgical, family and social history reviewed and updated as indicated. Interim medical history since our last visit reviewed. Allergies and medications reviewed and updated.  Review of Systems  Constitutional: Negative.   Respiratory: Negative.   Cardiovascular: Negative.   Gastrointestinal: Negative.   Genitourinary: Negative.   Neurological: Positive for light-headedness. Negative for dizziness, tremors, seizures, syncope, facial asymmetry, speech difficulty, weakness, numbness and headaches.  Psychiatric/Behavioral: Negative.     Per HPI unless specifically indicated above     Objective:    BP 125/73 (BP Location: Left Arm, Patient Position: Sitting, Cuff Size: Normal)   Pulse 79   Temp 97.6 F (36.4 C)   Wt 185 lb 4 oz (84 kg)   SpO2 98%   BMI 31.80 kg/m   Wt Readings from Last 3 Encounters:  08/09/17 185 lb 4 oz (84 kg)  08/06/17 184 lb 5 oz (83.6 kg)  06/18/17 187 lb 12.8 oz (85.2 kg)    Physical Exam  Constitutional: He is oriented to person, place, and time. He appears well-developed and well-nourished. No distress.  HENT:  Head: Normocephalic and atraumatic.  Right  Ear: Hearing normal.  Left Ear: Hearing normal.  Nose: Nose normal.  Eyes: Conjunctivae and lids are normal. Right eye exhibits no discharge. Left eye exhibits no discharge. No scleral icterus.  Cardiovascular: Normal rate, regular rhythm, normal heart sounds and intact distal pulses.  Exam reveals no gallop and no friction rub.   No murmur heard. Pulmonary/Chest: Effort normal and breath sounds normal. No respiratory distress. He has no wheezes. He has no rales. He exhibits no tenderness.  Abdominal: Soft. Bowel sounds are normal. He exhibits no distension and no mass. There is no tenderness. There is no rebound and no guarding.  Musculoskeletal: Normal range of motion.  Neurological: He is alert and oriented to person, place, and time.  Skin: Skin is intact. No rash noted. He is not diaphoretic.  Psychiatric: He has a normal mood and affect. His speech is normal and behavior is normal. Judgment and thought content normal. Cognition and memory are normal.  Nursing note and vitals reviewed.   Results for orders placed or performed in visit on 08/06/17  Microscopic Examination  Result Value Ref Range   WBC, UA None seen 0 - 5 /hpf   RBC, UA 0-2 0 - 2 /hpf   Epithelial Cells (non renal) 0-10 0 - 10 /hpf  CBC With Differential/Platelet  Result Value Ref Range   WBC 6.7 3.4 - 10.8 x10E3/uL   RBC  5.03 4.14 - 5.80 x10E6/uL   Hemoglobin 15.2 13.0 - 17.7 g/dL   Hematocrit 44.8 37.5 - 51.0 %   MCV 89 79 - 97 fL   MCH 30.2 26.6 - 33.0 pg   MCHC 33.9 31.5 - 35.7 g/dL   RDW 13.6 12.3 - 15.4 %   Platelets 206 150 - 379 x10E3/uL   Neutrophils 73 Not Estab. %   Lymphs 23 Not Estab. %   MID 4 Not Estab. %   Neutrophils Absolute 4.9 1.4 - 7.0 x10E3/uL   Lymphocytes Absolute 1.5 0.7 - 3.1 x10E3/uL   MID (Absolute) 0.3 0.1 - 1.6 X10E3/uL  UA/M w/rflx Culture, Routine  Result Value Ref Range   Specific Gravity, UA 1.020 1.005 - 1.030   pH, UA 5.5 5.0 - 7.5   Color, UA Yellow Yellow    Appearance Ur Clear Clear   Leukocytes, UA Negative Negative   Protein, UA Negative Negative/Trace   Glucose, UA Negative Negative   Ketones, UA Negative Negative   RBC, UA 1+ (A) Negative   Bilirubin, UA Negative Negative   Urobilinogen, Ur 0.2 0.2 - 1.0 mg/dL   Nitrite, UA Negative Negative   Microscopic Examination See below:       Assessment & Plan:   Problem List Items Addressed This Visit    None    Visit Diagnoses    RLQ abdominal pain    -  Primary   Resolved. Feeling better.    Dizziness       Did not drink a lot of water yesterday. Advised increasing fluids. Call if continues       Follow up plan: Return As scheduled.

## 2017-08-10 ENCOUNTER — Encounter: Payer: Self-pay | Admitting: Family Medicine

## 2017-09-02 ENCOUNTER — Other Ambulatory Visit: Payer: Self-pay | Admitting: Family Medicine

## 2017-09-12 ENCOUNTER — Encounter: Payer: Self-pay | Admitting: Podiatry

## 2017-09-12 ENCOUNTER — Encounter: Payer: Self-pay | Admitting: Family Medicine

## 2017-09-13 ENCOUNTER — Encounter: Payer: Self-pay | Admitting: Family Medicine

## 2017-09-13 ENCOUNTER — Encounter: Payer: Self-pay | Admitting: Podiatry

## 2017-09-13 ENCOUNTER — Ambulatory Visit (INDEPENDENT_AMBULATORY_CARE_PROVIDER_SITE_OTHER): Payer: Medicaid Other | Admitting: Podiatry

## 2017-09-13 DIAGNOSIS — M10072 Idiopathic gout, left ankle and foot: Secondary | ICD-10-CM

## 2017-09-13 MED ORDER — COLCHICINE 0.6 MG PO TABS: 0.6000 mg | ORAL_TABLET | Freq: Every day | ORAL | 0 refills | Status: DC

## 2017-09-16 ENCOUNTER — Encounter: Payer: Self-pay | Admitting: Family Medicine

## 2017-09-16 NOTE — Telephone Encounter (Signed)
Message sent to patient

## 2017-09-17 NOTE — Progress Notes (Signed)
   HPI: Patient is a 46 year old male with a PMHx of gout presenting today with a complaint of pain in the first MPJ of the left foot that began 2 days ago. He reports associated swelling. Bearing weight on the area increases the pain. He has not done anything to treat his symptoms. He is here for further evaluation and treatment.  Past Medical History:  Diagnosis Date  . Autism   . Constipation   . GERD (gastroesophageal reflux disease)   . Gout   . High cholesterol   . History of kidney stones   . Overweight   . Prostatitis   . Suspicious nevus   . UTI (lower urinary tract infection)      Physical Exam: General: The patient is alert and oriented x3 in no acute distress.  Dermatology: Skin is warm, dry and supple bilateral lower extremities. Negative for open lesions or macerations.  Vascular: Palpable pedal pulses bilaterally. No edema or erythema noted. Capillary refill within normal limits.  Neurological: Epicritic and protective threshold grossly intact bilaterally.   Musculoskeletal Exam: Inflamed first MPJ of the left foot with erythema and edema. Pain on palpation to the first MPJ of the left foot. Range of motion within normal limits to all pedal and ankle joints bilateral. Muscle strength 5/5 in all groups bilateral.   Assessment: - Gout first MPJ left   Plan of Care:  - Injection of 0.5 mLs Celestone Soluspan injected into 1st MPJ of the left foot. - Prescription for colchicine 0.6 mg given to patient. - Discussed maintaining healthy diet that does not elevate uric acid levels. - Return to clinic in 4 weeks.   Edrick Kins, DPM [facility]  Dr. Edrick Kins, DPM    [address], [place] [address]                Office [phone]  Fax [phone]

## 2017-09-18 ENCOUNTER — Encounter: Payer: Self-pay | Admitting: Family Medicine

## 2017-09-18 ENCOUNTER — Ambulatory Visit: Payer: Medicaid Other | Admitting: Podiatry

## 2017-09-18 NOTE — Telephone Encounter (Signed)
Routing to provider  

## 2017-09-19 ENCOUNTER — Ambulatory Visit (INDEPENDENT_AMBULATORY_CARE_PROVIDER_SITE_OTHER): Payer: Medicaid Other | Admitting: Family Medicine

## 2017-09-19 ENCOUNTER — Encounter: Payer: Self-pay | Admitting: Family Medicine

## 2017-09-19 VITALS — BP 133/73 | HR 81 | Temp 98.0°F | Wt 180.4 lb

## 2017-09-19 DIAGNOSIS — R1031 Right lower quadrant pain: Secondary | ICD-10-CM | POA: Diagnosis not present

## 2017-09-19 DIAGNOSIS — L309 Dermatitis, unspecified: Secondary | ICD-10-CM

## 2017-09-19 DIAGNOSIS — E782 Mixed hyperlipidemia: Secondary | ICD-10-CM | POA: Diagnosis not present

## 2017-09-19 DIAGNOSIS — Z23 Encounter for immunization: Secondary | ICD-10-CM

## 2017-09-19 NOTE — Assessment & Plan Note (Signed)
Rechecking levels today. Continue current regimen. Continue to monitor. Call with any concerns.  

## 2017-09-19 NOTE — Progress Notes (Signed)
BP 133/73   Pulse 81   Temp 98 F (36.7 C)   Wt 180 lb 6.4 oz (81.8 kg)   SpO2 98%   BMI 30.97 kg/m    Subjective:    Patient ID: Jesus Guerrero, male    DOB: Jan 30, 1971, 46 y.o.   MRN: [id number]  HPI: Jesus Guerrero is a 46 y.o. male  Chief Complaint  Patient presents with  . Hyperlipidemia    3 month f/up    HYPERLIPIDEMIA Hyperlipidemia status: Stable. Has been watching his diet as much as he can Satisfied with current treatment?  yes Supplements: none Aspirin:  no The 10-year ASCVD risk score [name], et al., 2013) is: 4.5%   Values used to calculate the score:     Age: 28 years     Sex: Male     Is Non-Hispanic African American: No     Diabetic: No     Tobacco smoker: No     Systolic Blood Pressure: 725 mmHg     Is BP treated: No     HDL Cholesterol: 42 mg/dL     Total Cholesterol: 244 mg/dL Chest pain:  no Coronary artery disease:  no Family history CAD:  unknown  Toe is doing OK. Belly is doing well. Continuing to have some issues with itching. Medicine is helpin.    Relevant past medical, surgical, family and social history reviewed and updated as indicated. Interim medical history since our last visit reviewed. Allergies and medications reviewed and updated.  Review of Systems  Constitutional: Negative.   Respiratory: Negative.   Musculoskeletal: Negative.   Skin: Negative for color change, pallor, rash and wound.  Psychiatric/Behavioral: Negative.     Per HPI unless specifically indicated above     Objective:    BP 133/73   Pulse 81   Temp 98 F (36.7 C)   Wt 180 lb 6.4 oz (81.8 kg)   SpO2 98%   BMI 30.97 kg/m   Wt Readings from Last 3 Encounters:  09/19/17 180 lb 6.4 oz (81.8 kg)  08/09/17 185 lb 4 oz (84 kg)  08/06/17 184 lb 5 oz (83.6 kg)    Physical Exam  Constitutional: He is oriented to person, place, and time. He appears well-developed and well-nourished. No distress.  HENT:  Head: Normocephalic and atraumatic.    Right Ear: Hearing normal.  Left Ear: Hearing normal.  Nose: Nose normal.  Eyes: Conjunctivae and lids are normal. Right eye exhibits no discharge. Left eye exhibits no discharge. No scleral icterus.  Cardiovascular: Normal rate, regular rhythm, normal heart sounds and intact distal pulses.  Exam reveals no gallop and no friction rub.   No murmur heard. Pulmonary/Chest: Effort normal and breath sounds normal. No respiratory distress. He has no wheezes. He has no rales. He exhibits no tenderness.  Musculoskeletal: Normal range of motion.  Neurological: He is alert and oriented to person, place, and time.  Skin: Skin is warm, dry and intact. No rash noted. He is not diaphoretic. No erythema. No pallor.  Psychiatric: He has a normal mood and affect. His speech is normal and behavior is normal. Judgment and thought content normal. Cognition and memory are normal.  Nursing note and vitals reviewed.   Results for orders placed or performed in visit on 08/06/17  Microscopic Examination  Result Value Ref Range   WBC, UA None seen 0 - 5 /hpf   RBC, UA 0-2 0 - 2 /hpf   Epithelial Cells (non  renal) 0-10 0 - 10 /hpf  CBC With Differential/Platelet  Result Value Ref Range   WBC 6.7 3.4 - 10.8 x10E3/uL   RBC 5.03 4.14 - 5.80 x10E6/uL   Hemoglobin 15.2 13.0 - 17.7 g/dL   Hematocrit 44.8 37.5 - 51.0 %   MCV 89 79 - 97 fL   MCH 30.2 26.6 - 33.0 pg   MCHC 33.9 31.5 - 35.7 g/dL   RDW 13.6 12.3 - 15.4 %   Platelets 206 150 - 379 x10E3/uL   Neutrophils 73 Not Estab. %   Lymphs 23 Not Estab. %   MID 4 Not Estab. %   Neutrophils Absolute 4.9 1.4 - 7.0 x10E3/uL   Lymphocytes Absolute 1.5 0.7 - 3.1 x10E3/uL   MID (Absolute) 0.3 0.1 - 1.6 X10E3/uL  UA/M w/rflx Culture, Routine  Result Value Ref Range   Specific Gravity, UA 1.020 1.005 - 1.030   pH, UA 5.5 5.0 - 7.5   Color, UA Yellow Yellow   Appearance Ur Clear Clear   Leukocytes, UA Negative Negative   Protein, UA Negative Negative/Trace    Glucose, UA Negative Negative   Ketones, UA Negative Negative   RBC, UA 1+ (A) Negative   Bilirubin, UA Negative Negative   Urobilinogen, Ur 0.2 0.2 - 1.0 mg/dL   Nitrite, UA Negative Negative   Microscopic Examination See below:       Assessment & Plan:   Problem List Items Addressed This Visit      Musculoskeletal and Integument   Eczema    Stable. Continue current regimen. Call with any concerns.         Other   Abdominal pain    Better. Continue PRN Bentyl. Call with any concerns.       Hyperlipidemia - Primary    Rechecking levels today. Continue current regimen. Continue to monitor. Call with any concerns.       Relevant Orders   Lipid Panel w/o Chol/HDL Ratio    Other Visit Diagnoses    Need for influenza vaccination       Flu shot given today.   Relevant Orders   Flu Vaccine QUAD 36+ mos IM (Completed)       Follow up plan: Return 3-6 months, for follow up .

## 2017-09-19 NOTE — Patient Instructions (Addendum)

## 2017-09-19 NOTE — Assessment & Plan Note (Signed)
Stable. Continue current regimen. Call with any concerns.  

## 2017-09-19 NOTE — Assessment & Plan Note (Signed)
Better. Continue PRN Bentyl. Call with any concerns.

## 2017-09-20 ENCOUNTER — Encounter: Payer: Self-pay | Admitting: Family Medicine

## 2017-09-20 LAB — LIPID PANEL W/O CHOL/HDL RATIO
Cholesterol, Total: 245 mg/dL — ABNORMAL HIGH (ref 100–199)
HDL: 38 mg/dL — ABNORMAL LOW (ref >39)
LDL Calculated: 141 mg/dL — ABNORMAL HIGH (ref 0–99)
Triglycerides: 328 mg/dL — ABNORMAL HIGH (ref 0–149)
VLDL Cholesterol Cal: 66 mg/dL — ABNORMAL HIGH (ref 5–40)

## 2017-09-20 MED ORDER — ATORVASTATIN CALCIUM 40 MG PO TABS: 40.0000 mg | ORAL_TABLET | Freq: Every day | ORAL | 3 refills | Status: DC

## 2017-09-20 NOTE — Telephone Encounter (Signed)
Routing to provider  

## 2017-09-21 ENCOUNTER — Encounter: Payer: Self-pay | Admitting: Family Medicine

## 2017-09-23 MED ORDER — BETAMETHASONE SOD PHOS & ACET 6 (3-3) MG/ML IJ SUSP: 3.0000 mg | Freq: Once | INTRAMUSCULAR | Status: DC

## 2017-09-27 ENCOUNTER — Encounter: Payer: Self-pay | Admitting: Family Medicine

## 2017-09-30 NOTE — Telephone Encounter (Signed)
Called and left a message for patient to call the office and schedule an appointment.

## 2017-10-13 ENCOUNTER — Encounter: Payer: Self-pay | Admitting: Family Medicine

## 2017-10-15 ENCOUNTER — Encounter: Payer: Self-pay | Admitting: Family Medicine

## 2017-10-19 ENCOUNTER — Encounter: Payer: Self-pay | Admitting: Family Medicine

## 2017-10-20 ENCOUNTER — Encounter: Payer: Self-pay | Admitting: Family Medicine

## 2017-10-21 ENCOUNTER — Ambulatory Visit: Payer: Self-pay | Admitting: *Deleted

## 2017-10-21 ENCOUNTER — Encounter: Payer: Self-pay | Admitting: Family Medicine

## 2017-10-21 NOTE — Telephone Encounter (Signed)
Pt  Sent im message  Through  My  Chart   About arm swelling   Reason for Disposition . [1] MILD pain (e.g., does not interfere with normal activities) AND [2] present > 7 days  Answer Assessment - Initial Assessment Questions 1. ONSET: "When did the pain start?"    2  Weeks   2. LOCATION: "Where is the pain located?"      L  Elbow   And   Shoulder   3. PAIN: "How bad is the pain?" (Scale 1-10; or mild, moderate, severe)   - MILD (1-3): doesn't interfere with normal activities   - MODERATE (4-7): interferes with normal activities (e.g., work or school) or awakens from sleep   - SEVERE (8-10): excruciating pain, unable to do any normal activities, unable to hold a cup of water     7  4. WORK OR EXERCISE: "Has there been any recent work or exercise that involved this part of the body?"    No  5. CAUSE: "What do you think is causing the arm pain?"     Medication   Statin   6. OTHER SYMPTOMS: "Do you have any other symptoms?" (e.g., neck pain, swelling, rash, fever, numbness, weakness)  no 7. PREGNANCY: "Is there any chance you are pregnant?" "When was your last menstrual period?"     none  Protocols used: ARM PAIN-A-AH

## 2017-10-22 ENCOUNTER — Ambulatory Visit (INDEPENDENT_AMBULATORY_CARE_PROVIDER_SITE_OTHER): Payer: Medicaid Other | Admitting: Family Medicine

## 2017-10-22 ENCOUNTER — Encounter: Payer: Self-pay | Admitting: Family Medicine

## 2017-10-22 VITALS — BP 130/70 | HR 77 | Temp 98.5°F | Wt 183.2 lb

## 2017-10-22 DIAGNOSIS — E782 Mixed hyperlipidemia: Secondary | ICD-10-CM | POA: Diagnosis not present

## 2017-10-22 DIAGNOSIS — M25522 Pain in left elbow: Secondary | ICD-10-CM | POA: Diagnosis not present

## 2017-10-22 MED ORDER — PREDNISONE 50 MG PO TABS: 50.0000 mg | ORAL_TABLET | Freq: Every day | ORAL | 0 refills | Status: DC

## 2017-10-22 NOTE — Patient Instructions (Signed)
No meloxicam with prednisone Take prednisone in MORNING (before noon) Take 1/2 cholesterol medicine Follow up with me in 2 weeks

## 2017-10-22 NOTE — Progress Notes (Signed)
BP 130/70 (BP Location: Left Arm, Patient Position: Sitting, Cuff Size: Normal)   Pulse 77   Temp 98.5 F (36.9 C)   Wt 183 lb 4 oz (83.1 kg)   SpO2 98%   BMI 31.45 kg/m    Subjective:    Patient ID: Jesus Guerrero, male    DOB: June 19, 1971, 46 y.o.   MRN: [id number]  HPI: Jesus Guerrero is a 46 y.o. male  Chief Complaint  Patient presents with  . Elbow Pain    Left   ARM PAIN Duration: Since Friday Location: left elbow Mechanism of injury: unknown Onset: gradual Severity: 7/10  Quality:  sharp Frequency: constant Radiation: no Aggravating factors: bending  Alleviating factors: nothing  Status: stable Treatments attempted: rest, meloxicam  Relief with NSAIDs?:  no Swelling: yes Redness: no  Warmth: yes Trauma: no Chest pain: no  Shortness of breath: no  Fever: no Decreased sensation: no Paresthesias: no Weakness: no  HYPERLIPIDEMIA Hyperlipidemia status: good compliance Satisfied with current treatment?  no Side effects:  yes- myalgias Medication compliance: good compliance Past cholesterol meds: atorvastatin Supplements: none Aspirin:  no The 10-year ASCVD risk score [name], et al., 2013) is: 4.9%   Values used to calculate the score:     Age: 42 years     Sex: Male     Is Non-Hispanic African American: No     Diabetic: No     Tobacco smoker: No     Systolic Blood Pressure: 341 mmHg     Is BP treated: No     HDL Cholesterol: 38 mg/dL     Total Cholesterol: 245 mg/dL Chest pain:  no Coronary artery disease:  no   Relevant past medical, surgical, family and social history reviewed and updated as indicated. Interim medical history since our last visit reviewed. Allergies and medications reviewed and updated.  Review of Systems  Constitutional: Negative.   Respiratory: Negative.   Cardiovascular: Negative.   Musculoskeletal: Positive for arthralgias and myalgias. Negative for back pain, gait problem, joint swelling, neck pain and  neck stiffness.  Psychiatric/Behavioral: Negative.     Per HPI unless specifically indicated above     Objective:    BP 130/70 (BP Location: Left Arm, Patient Position: Sitting, Cuff Size: Normal)   Pulse 77   Temp 98.5 F (36.9 C)   Wt 183 lb 4 oz (83.1 kg)   SpO2 98%   BMI 31.45 kg/m   Wt Readings from Last 3 Encounters:  10/22/17 183 lb 4 oz (83.1 kg)  09/19/17 180 lb 6.4 oz (81.8 kg)  08/09/17 185 lb 4 oz (84 kg)    Physical Exam  Constitutional: He is oriented to person, place, and time. He appears well-developed and well-nourished. No distress.  HENT:  Head: Normocephalic and atraumatic.  Right Ear: Hearing normal.  Left Ear: Hearing normal.  Nose: Nose normal.  Eyes: Conjunctivae and lids are normal. Right eye exhibits no discharge. Left eye exhibits no discharge. No scleral icterus.  Cardiovascular: Normal rate, regular rhythm, normal heart sounds and intact distal pulses.  Exam reveals no gallop and no friction rub.   No murmur heard. Pulmonary/Chest: Effort normal and breath sounds normal. No respiratory distress. He has no wheezes. He has no rales. He exhibits no tenderness.  Musculoskeletal: Normal range of motion. He exhibits tenderness (generalized to elbow- no specific spots, no redness, no swelling, mild tenderness). He exhibits no edema or deformity.  Neurological: He is alert and oriented to person,  place, and time.  Skin: Skin is warm, dry and intact. No rash noted. He is not diaphoretic. No erythema. No pallor.  Psychiatric: He has a normal mood and affect. His speech is normal and behavior is normal. Judgment and thought content normal. Cognition and memory are normal.  Nursing note reviewed.   Results for orders placed or performed in visit on 09/19/17  Lipid Panel w/o Chol/HDL Ratio  Result Value Ref Range   Cholesterol, Total 245 (H) 100 - 199 mg/dL   Triglycerides 328 (H) 0 - 149 mg/dL   HDL 38 (L) >39 mg/dL   VLDL Cholesterol Cal 66 (H) 5 - 40  mg/dL   LDL Calculated 141 (H) 0 - 99 mg/dL      Assessment & Plan:   Problem List Items Addressed This Visit      Other   Hyperlipidemia    Having some myalgias. Will decrease to 20mg  daily and recheck in 2 weeks to see how he's doing.        Other Visit Diagnoses    Elbow pain, left    -  Primary   Will treat with 5 day burst of prednisone. Recheck 2 weeks to see how he's doing.        Follow up plan: Return in about 2 weeks (around 11/05/2017) for Follow up elbow pain and HLD.

## 2017-10-22 NOTE — Assessment & Plan Note (Signed)
Having some myalgias. Will decrease to 20mg  daily and recheck in 2 weeks to see how he's doing.

## 2017-11-07 ENCOUNTER — Encounter: Payer: Self-pay | Admitting: Family Medicine

## 2017-11-12 ENCOUNTER — Ambulatory Visit: Payer: Medicaid Other | Admitting: Family Medicine

## 2017-11-12 ENCOUNTER — Encounter: Payer: Self-pay | Admitting: Family Medicine

## 2017-11-12 VITALS — BP 123/73 | HR 81 | Temp 97.5°F | Wt 187.0 lb

## 2017-11-12 DIAGNOSIS — R05 Cough: Secondary | ICD-10-CM | POA: Diagnosis not present

## 2017-11-12 DIAGNOSIS — E782 Mixed hyperlipidemia: Secondary | ICD-10-CM | POA: Diagnosis not present

## 2017-11-12 DIAGNOSIS — L739 Follicular disorder, unspecified: Secondary | ICD-10-CM | POA: Diagnosis not present

## 2017-11-12 DIAGNOSIS — R059 Cough, unspecified: Secondary | ICD-10-CM

## 2017-11-12 DIAGNOSIS — M25522 Pain in left elbow: Secondary | ICD-10-CM

## 2017-11-12 MED ORDER — MUPIROCIN CALCIUM 2 % NA OINT: TOPICAL_OINTMENT | NASAL | 0 refills | Status: DC

## 2017-11-12 MED ORDER — ATORVASTATIN CALCIUM 20 MG PO TABS: 20.0000 mg | ORAL_TABLET | Freq: Every day | ORAL | 1 refills | Status: DC

## 2017-11-12 MED ORDER — MONTELUKAST SODIUM 10 MG PO TABS: 10.0000 mg | ORAL_TABLET | Freq: Every day | ORAL | 1 refills | Status: DC

## 2017-11-12 NOTE — Assessment & Plan Note (Signed)
Doing better with 20mg . Will recheck levels today. Call with any concerns.

## 2017-11-12 NOTE — Progress Notes (Signed)
BP 123/73 (BP Location: Left Arm, Patient Position: Sitting, Cuff Size: Normal)   Pulse 81   Temp (!) 97.5 F (36.4 C)   Wt 187 lb (84.8 kg)   SpO2 97%   BMI 32.10 kg/m    Subjective:    Patient ID: Jesus Guerrero, male    DOB: 08-26-1971, 46 y.o.   MRN: [id number]  HPI: Jesus Guerrero is a 46 y.o. male  Chief Complaint  Patient presents with  . Hyperlipidemia  . Skin Problem  . Cough   HYPERLIPIDEMIA Hyperlipidemia status: excellent compliance Satisfied with current treatment?  yes Side effects:  no Medication compliance: excellent compliance Past cholesterol meds: atorvastatin Supplements: none Aspirin:  no The 10-year ASCVD risk score [name], et al., 2013) is: 4.5%   Values used to calculate the score:     Age: 37 years     Sex: Male     Is Non-Hispanic African American: No     Diabetic: No     Tobacco smoker: No     Systolic Blood Pressure: 409 mmHg     Is BP treated: No     HDL Cholesterol: 38 mg/dL     Total Cholesterol: 245 mg/dL Chest pain:  no  SKIN LESION Duration: about a week Location: chest Painful: yes Itching: no Onset: sudden Context: not changing Associated signs and symptoms: none History of skin cancer: no History of precancerous skin lesions: no Family history of skin cancer: no  UPPER RESPIRATORY TRACT INFECTION Duration: about a month Worst symptom: cough Fever: no Cough: yes Shortness of breath: no Wheezing: no Chest pain: no Chest tightness: no Chest congestion: yes Nasal congestion: yes Runny nose: yes Post nasal drip: yes Sneezing: yes Sore throat: no Swollen glands: no Sinus pressure: yes Headache: yes Face pain: no Toothache: no Ear pain: no  Ear pressure: yes bilateral Eyes red/itching:no Eye drainage/crusting: no  Vomiting: no Rash: no Fatigue: no Sick contacts: yes Strep contacts: no  Context: stable Recurrent sinusitis: no Relief with OTC cold/cough medications: no  Treatments  attempted: anti-histamine   Elbow pain is resolved. Feels better.   Relevant past medical, surgical, family and social history reviewed and updated as indicated. Interim medical history since our last visit reviewed. Allergies and medications reviewed and updated.  Review of Systems  Constitutional: Negative.   HENT: Positive for congestion, postnasal drip and rhinorrhea. Negative for dental problem, drooling, ear discharge, ear pain, facial swelling, hearing loss, mouth sores, nosebleeds, sinus pressure, sinus pain, sneezing, sore throat, tinnitus, trouble swallowing and voice change.   Respiratory: Positive for cough. Negative for apnea, choking, chest tightness, shortness of breath, wheezing and stridor.   Cardiovascular: Negative.   Psychiatric/Behavioral: Negative.     Per HPI unless specifically indicated above     Objective:    BP 123/73 (BP Location: Left Arm, Patient Position: Sitting, Cuff Size: Normal)   Pulse 81   Temp (!) 97.5 F (36.4 C)   Wt 187 lb (84.8 kg)   SpO2 97%   BMI 32.10 kg/m   Wt Readings from Last 3 Encounters:  11/12/17 187 lb (84.8 kg)  10/22/17 183 lb 4 oz (83.1 kg)  09/19/17 180 lb 6.4 oz (81.8 kg)    Physical Exam  Constitutional: He is oriented to person, place, and time. He appears well-developed and well-nourished. No distress.  HENT:  Head: Normocephalic and atraumatic.  Right Ear: Hearing and external ear normal.  Left Ear: Hearing and external ear normal.  Nose: Nose normal.  Mouth/Throat: Oropharynx is clear and moist. No oropharyngeal exudate.  Eyes: Conjunctivae, EOM and lids are normal. Pupils are equal, round, and reactive to light. Right eye exhibits no discharge. Left eye exhibits no discharge. No scleral icterus.  Neck: Normal range of motion. Neck supple. No JVD present. No tracheal deviation present. No thyromegaly present.  Cardiovascular: Normal rate, regular rhythm, normal heart sounds and intact distal pulses. Exam reveals  no gallop and no friction rub.  No murmur heard. Pulmonary/Chest: Effort normal and breath sounds normal. No stridor. No respiratory distress. He has no wheezes. He has no rales. He exhibits no tenderness.  Musculoskeletal: Normal range of motion.  Lymphadenopathy:    He has no cervical adenopathy.  Neurological: He is alert and oriented to person, place, and time.  Skin: Skin is warm, dry and intact. No rash noted. He is not diaphoretic. No erythema. No pallor.  Excoriated, erythematous spot on center of chest   Psychiatric: He has a normal mood and affect. His speech is normal and behavior is normal. Judgment and thought content normal. Cognition and memory are normal.  Nursing note and vitals reviewed.   Results for orders placed or performed in visit on 09/19/17  Lipid Panel w/o Chol/HDL Ratio  Result Value Ref Range   Cholesterol, Total 245 (H) 100 - 199 mg/dL   Triglycerides 328 (H) 0 - 149 mg/dL   HDL 38 (L) >39 mg/dL   VLDL Cholesterol Cal 66 (H) 5 - 40 mg/dL   LDL Calculated 141 (H) 0 - 99 mg/dL      Assessment & Plan:   Problem List Items Addressed This Visit      Other   Hyperlipidemia - Primary    Doing better with 20mg . Will recheck levels today. Call with any concerns.       Relevant Medications   atorvastatin (LIPITOR) 20 MG tablet   Other Relevant Orders   Lipid Panel w/o Chol/HDL Ratio   Comprehensive metabolic panel    Other Visit Diagnoses    Elbow pain, left       Resolved.    Folliculitis       Will treat with bactroban. Call with any concerns.    Cough       Likely allergic. Will start singulair. Call with any concerns or if not improving.        Follow up plan: Return in about 3 months (around 02/12/2018).

## 2017-11-13 LAB — COMPREHENSIVE METABOLIC PANEL
A/G RATIO: 1.5 (ref 1.2–2.2)
ALK PHOS: 86 IU/L (ref 39–117)
ALT: 19 IU/L (ref 0–44)
AST: 14 IU/L (ref 0–40)
Albumin: 4 g/dL (ref 3.5–5.5)
BUN/Creatinine Ratio: 14 (ref 9–20)
BUN: 16 mg/dL (ref 6–24)
Bilirubin Total: 0.6 mg/dL (ref 0.0–1.2)
CALCIUM: 9.2 mg/dL (ref 8.7–10.2)
CHLORIDE: 103 mmol/L (ref 96–106)
CO2: 25 mmol/L (ref 20–29)
Creatinine, Ser: 1.16 mg/dL (ref 0.76–1.27)
GFR calc Af Amer: 86 mL/min/{1.73_m2} (ref >59)
GFR, EST NON AFRICAN AMERICAN: 75 mL/min/{1.73_m2} (ref >59)
GLOBULIN, TOTAL: 2.7 g/dL (ref 1.5–4.5)
Glucose: 91 mg/dL (ref 65–99)
POTASSIUM: 4.6 mmol/L (ref 3.5–5.2)
SODIUM: 143 mmol/L (ref 134–144)
Total Protein: 6.7 g/dL (ref 6.0–8.5)

## 2017-11-13 LAB — LIPID PANEL W/O CHOL/HDL RATIO
CHOLESTEROL TOTAL: 152 mg/dL (ref 100–199)
HDL: 45 mg/dL (ref >39)
LDL CALC: 79 mg/dL (ref 0–99)
Triglycerides: 138 mg/dL (ref 0–149)
VLDL CHOLESTEROL CAL: 28 mg/dL (ref 5–40)

## 2017-11-23 ENCOUNTER — Encounter: Payer: Self-pay | Admitting: Podiatry

## 2017-11-23 ENCOUNTER — Encounter: Payer: Self-pay | Admitting: Family Medicine

## 2017-11-25 ENCOUNTER — Encounter: Payer: Self-pay | Admitting: Podiatry

## 2017-12-01 ENCOUNTER — Encounter: Payer: Self-pay | Admitting: Podiatry

## 2017-12-02 ENCOUNTER — Encounter: Payer: Self-pay | Admitting: Family Medicine

## 2017-12-02 ENCOUNTER — Encounter: Payer: Self-pay | Admitting: Podiatry

## 2017-12-02 ENCOUNTER — Ambulatory Visit: Payer: Medicaid Other | Admitting: Family Medicine

## 2017-12-02 VITALS — BP 137/74 | HR 87 | Temp 98.2°F | Wt 183.3 lb

## 2017-12-02 DIAGNOSIS — R252 Cramp and spasm: Secondary | ICD-10-CM

## 2017-12-02 DIAGNOSIS — M1A9XX0 Chronic gout, unspecified, without tophus (tophi): Secondary | ICD-10-CM | POA: Insufficient documentation

## 2017-12-02 DIAGNOSIS — M10072 Idiopathic gout, left ankle and foot: Secondary | ICD-10-CM

## 2017-12-02 MED ORDER — CYCLOBENZAPRINE HCL 10 MG PO TABS: 10.0000 mg | ORAL_TABLET | Freq: Every day | ORAL | 0 refills | Status: DC

## 2017-12-02 MED ORDER — COLCHICINE 0.6 MG PO TABS: 0.6000 mg | ORAL_TABLET | Freq: Two times a day (BID) | ORAL | 0 refills | Status: DC

## 2017-12-02 NOTE — Progress Notes (Signed)
BP 137/74 (BP Location: Left Arm, Patient Position: Sitting, Cuff Size: Normal)   Pulse 87   Temp 98.2 F (36.8 C)   Wt 183 lb 5 oz (83.2 kg)   SpO2 98%   BMI 31.47 kg/m    Subjective:    Patient ID: Jesus Guerrero, male    DOB: February 08, 1971, 46 y.o.   MRN: [id number]  HPI: Jesus Guerrero is a 46 y.o. male  Chief Complaint  Patient presents with  . Leg Pain    Patient states that he woke up in the middle of the night and couldn't hardle put any weight on it.    LEG CRAMPS Duration: started hurting in the middle of the night, couldn't put any weight on it Pain: yes Severity: 9/10  Quality:  cramping Location:  calves on the R only Bilateral:  no Onset: sudden Frequency: constant Time of  day:   night time Sudden unintentional leg jerking:   no Paresthesias:   no Decreased sensation:  no Weakness:   yes Insomnia:   no Fatigue:   no Status: stable   Relevant past medical, surgical, family and social history reviewed and updated as indicated. Interim medical history since our last visit reviewed. Allergies and medications reviewed and updated.  Review of Systems  Constitutional: Negative.   Respiratory: Negative.   Cardiovascular: Negative.   Musculoskeletal: Positive for myalgias. Negative for arthralgias, back pain, gait problem, joint swelling, neck pain and neck stiffness.  Skin: Positive for color change. Negative for pallor, rash and wound.  Psychiatric/Behavioral: Negative.     Per HPI unless specifically indicated above     Objective:    BP 137/74 (BP Location: Left Arm, Patient Position: Sitting, Cuff Size: Normal)   Pulse 87   Temp 98.2 F (36.8 C)   Wt 183 lb 5 oz (83.2 kg)   SpO2 98%   BMI 31.47 kg/m   Wt Readings from Last 3 Encounters:  12/02/17 183 lb 5 oz (83.2 kg)  11/12/17 187 lb (84.8 kg)  10/22/17 183 lb 4 oz (83.1 kg)    Physical Exam  Constitutional: He is oriented to person, place, and time. He appears well-developed and  well-nourished. No distress.  HENT:  Head: Normocephalic and atraumatic.  Right Ear: Hearing normal.  Left Ear: Hearing normal.  Nose: Nose normal.  Eyes: Conjunctivae and lids are normal. Right eye exhibits no discharge. Left eye exhibits no discharge. No scleral icterus.  Cardiovascular: Normal rate, regular rhythm, normal heart sounds and intact distal pulses. Exam reveals no gallop and no friction rub.  No murmur heard. Pulmonary/Chest: Effort normal and breath sounds normal. No respiratory distress. He has no wheezes. He has no rales. He exhibits no tenderness.  Musculoskeletal: Normal range of motion. He exhibits edema (Red, swollen, tender L great toe, R leg soft, non-tender, no redness, no heat).  Neurological: He is alert and oriented to person, place, and time.  Skin: Skin is intact. No rash noted. He is not diaphoretic.  Psychiatric: He has a normal mood and affect. His speech is normal and behavior is normal. Judgment and thought content normal. Cognition and memory are normal.  Nursing note and vitals reviewed.   Results for orders placed or performed in visit on 11/12/17  Lipid Panel w/o Chol/HDL Ratio  Result Value Ref Range   Cholesterol, Total 152 100 - 199 mg/dL   Triglycerides 138 0 - 149 mg/dL   HDL 45 >39 mg/dL   VLDL Cholesterol Cal 28  5 - 40 mg/dL   LDL Calculated 79 0 - 99 mg/dL  Comprehensive metabolic panel  Result Value Ref Range   Glucose 91 65 - 99 mg/dL   BUN 16 6 - 24 mg/dL   Creatinine, Ser 1.16 0.76 - 1.27 mg/dL   GFR calc non Af Amer 75 >59 mL/min/1.73   GFR calc Af Amer 86 >59 mL/min/1.73   BUN/Creatinine Ratio 14 9 - 20   Sodium 143 134 - 144 mmol/L   Potassium 4.6 3.5 - 5.2 mmol/L   Chloride 103 96 - 106 mmol/L   CO2 25 20 - 29 mmol/L   Calcium 9.2 8.7 - 10.2 mg/dL   Total Protein 6.7 6.0 - 8.5 g/dL   Albumin 4.0 3.5 - 5.5 g/dL   Globulin, Total 2.7 1.5 - 4.5 g/dL   Albumin/Globulin Ratio 1.5 1.2 - 2.2   Bilirubin Total 0.6 0.0 - 1.2  mg/dL   Alkaline Phosphatase 86 39 - 117 IU/L   AST 14 0 - 40 IU/L   ALT 19 0 - 44 IU/L      Assessment & Plan:   Problem List Items Addressed This Visit      Musculoskeletal and Integument   Acute idiopathic gout involving toe of left foot    Will treat with colchicine. Will check uric acid- if elevated, will add allopurinol. Continue to monitor. Follow up with podiatry as scheduled on Monday.      Relevant Medications   colchicine 0.6 MG tablet   cyclobenzaprine (FLEXERIL) 10 MG tablet   Other Relevant Orders   Uric acid    Other Visit Diagnoses    Leg cramp    -  Primary   Will check BMP. Increase fluids. Start flexeril at night. Call with any concerns.    Relevant Orders   Basic metabolic panel       Follow up plan: Return As scheduled.

## 2017-12-02 NOTE — Assessment & Plan Note (Signed)
Will treat with colchicine. Will check uric acid- if elevated, will add allopurinol. Continue to monitor. Follow up with podiatry as scheduled on Monday.

## 2017-12-03 ENCOUNTER — Telehealth: Payer: Self-pay | Admitting: Family Medicine

## 2017-12-03 LAB — BASIC METABOLIC PANEL
BUN/Creatinine Ratio: 25 — ABNORMAL HIGH (ref 9–20)
BUN: 23 mg/dL (ref 6–24)
CALCIUM: 8.8 mg/dL (ref 8.7–10.2)
CO2: 24 mmol/L (ref 20–29)
Chloride: 104 mmol/L (ref 96–106)
Creatinine, Ser: 0.93 mg/dL (ref 0.76–1.27)
GFR calc Af Amer: 113 mL/min/{1.73_m2} (ref >59)
GFR, EST NON AFRICAN AMERICAN: 97 mL/min/{1.73_m2} (ref >59)
GLUCOSE: 95 mg/dL (ref 65–99)
POTASSIUM: 3.9 mmol/L (ref 3.5–5.2)
SODIUM: 143 mmol/L (ref 134–144)

## 2017-12-03 LAB — URIC ACID: URIC ACID: 9.1 mg/dL — AB (ref 3.7–8.6)

## 2017-12-03 MED ORDER — ALLOPURINOL 100 MG PO TABS: 100.0000 mg | ORAL_TABLET | Freq: Every day | ORAL | 6 refills | Status: DC

## 2017-12-03 MED ORDER — COLCHICINE 0.6 MG PO CAPS: 0.6000 mg | ORAL_CAPSULE | Freq: Two times a day (BID) | ORAL | 0 refills | Status: DC

## 2017-12-03 NOTE — Telephone Encounter (Signed)
Please let him know that his electrolytes were good, but his uric acid was high, so I'm starting him on some medicine for it. He should continue the colchicine with it and he can take it with all his medicine- there is no interactions. I'd like to see him in a month to recheck his levels. Thanks!

## 2017-12-03 NOTE — Telephone Encounter (Signed)
Pt  Notified  Of lab   Results    And  He  Was  Advised  On  The  Addition  Of allopurinal  To  His  Existing  meds.   Was  Informed   Of  The  Pharmacy   It  Was   Sent to .  He was  Advised  It  Would  Not interfere   With  The  Medicines  He  Is  On.  He  Was  Informed  Of  The  followup  In  One month  For labs  And  He  Stated  He  Would  Call  Back on that and  Schedule  It .

## 2017-12-04 ENCOUNTER — Ambulatory Visit: Payer: Medicaid Other | Admitting: Family Medicine

## 2017-12-07 ENCOUNTER — Encounter: Payer: Self-pay | Admitting: Family Medicine

## 2017-12-09 ENCOUNTER — Ambulatory Visit: Payer: Medicaid Other | Admitting: Podiatry

## 2017-12-16 ENCOUNTER — Encounter: Payer: Self-pay | Admitting: Family Medicine

## 2017-12-17 ENCOUNTER — Ambulatory Visit: Payer: Medicaid Other | Admitting: Family Medicine

## 2017-12-18 ENCOUNTER — Ambulatory Visit: Payer: Medicaid Other | Admitting: Podiatry

## 2017-12-20 ENCOUNTER — Encounter: Payer: Self-pay | Admitting: Family Medicine

## 2018-01-03 ENCOUNTER — Encounter: Payer: Self-pay | Admitting: Family Medicine

## 2018-01-06 ENCOUNTER — Encounter: Payer: Self-pay | Admitting: Family Medicine

## 2018-01-06 ENCOUNTER — Ambulatory Visit: Payer: Medicaid Other | Admitting: Family Medicine

## 2018-01-06 VITALS — BP 130/72 | HR 79 | Temp 98.5°F | Wt 181.4 lb

## 2018-01-06 DIAGNOSIS — S5002XA Contusion of left elbow, initial encounter: Secondary | ICD-10-CM

## 2018-01-06 DIAGNOSIS — M10072 Idiopathic gout, left ankle and foot: Secondary | ICD-10-CM

## 2018-01-06 MED ORDER — IBUPROFEN 800 MG PO TABS: 800.0000 mg | ORAL_TABLET | Freq: Three times a day (TID) | ORAL | 0 refills | Status: AC | PRN

## 2018-01-06 NOTE — Assessment & Plan Note (Signed)
Feeling well. Rechecking levels today. Await results. Call with any concerns.

## 2018-01-06 NOTE — Progress Notes (Signed)
BP 130/72 (BP Location: Left Arm, Patient Position: Sitting, Cuff Size: Normal)   Pulse 79   Temp 98.5 F (36.9 C)   Wt 181 lb 7 oz (82.3 kg)   SpO2 98%   BMI 31.14 kg/m    Subjective:    Patient ID: Jesus Guerrero, male    DOB: 1971/03/04, 47 y.o.   MRN: [id number]  HPI: Jesus Guerrero is a 47 y.o. male  Chief Complaint  Patient presents with  . Gout  . Elbow Pain   GOUT- started on allopurinol last month for gout Duration:diagnosed about a month ago Right 1st metatarsophalangeal pain: no Left 1st metatarsophalangeal pain: yes Right knee pain: no Left knee pain: no Severity: moderate  Quality: sharp and aching Swelling: yes Redness: yes Trauma: no Recent dietary change or indiscretion: no Fevers: no Nausea/vomiting: no Status:  better Treatments attempted: cochicine, allopurinol  ARM PAIN- tripped down the stairs 3 days ago, missed the bottom step and hit his elbow on the door jam. Now sore to the touch and hard to move it. Duration: 3 days Location:  elbow Mechanism of injury: trauma Onset: sudden Severity: moderate  Quality:  aching Frequency: constant Radiation: no Aggravating factors: going to bed   Alleviating factors: nothing  Status: stable Treatments attempted: none  Relief with NSAIDs?:  No NSAIDs Taken Swelling: yes Redness: no  Warmth: no Trauma: yes Chest pain: no  Shortness of breath: no  Fever: no Decreased sensation: no Paresthesias: no Weakness: no  Relevant past medical, surgical, family and social history reviewed and updated as indicated. Interim medical history since our last visit reviewed. Allergies and medications reviewed and updated.  Review of Systems  Constitutional: Negative.   Respiratory: Negative.   Cardiovascular: Negative.   Musculoskeletal: Positive for arthralgias and joint swelling. Negative for back pain, gait problem, myalgias, neck pain and neck stiffness.  Neurological: Negative.     Psychiatric/Behavioral: Negative.     Per HPI unless specifically indicated above     Objective:    BP 130/72 (BP Location: Left Arm, Patient Position: Sitting, Cuff Size: Normal)   Pulse 79   Temp 98.5 F (36.9 C)   Wt 181 lb 7 oz (82.3 kg)   SpO2 98%   BMI 31.14 kg/m   Wt Readings from Last 3 Encounters:  01/06/18 181 lb 7 oz (82.3 kg)  12/02/17 183 lb 5 oz (83.2 kg)  11/12/17 187 lb (84.8 kg)    Physical Exam  Constitutional: He is oriented to person, place, and time. He appears well-developed and well-nourished. No distress.  HENT:  Head: Normocephalic and atraumatic.  Right Ear: Hearing normal.  Left Ear: Hearing normal.  Nose: Nose normal.  Eyes: Conjunctivae and lids are normal. Right eye exhibits no discharge. Left eye exhibits no discharge. No scleral icterus.  Cardiovascular: Normal rate, regular rhythm, normal heart sounds and intact distal pulses. Exam reveals no gallop and no friction rub.  No murmur heard. Pulmonary/Chest: Effort normal and breath sounds normal. No respiratory distress. He has no wheezes. He has no rales. He exhibits no tenderness.  Musculoskeletal: Normal range of motion. He exhibits tenderness. He exhibits no edema or deformity.  Mild tenderness to L elbow. No bruising, no swelling. FROM  Neurological: He is alert and oriented to person, place, and time.  Skin: Skin is warm, dry and intact. No rash noted. He is not diaphoretic. No erythema. No pallor.  Psychiatric: He has a normal mood and affect. His speech is normal  and behavior is normal. Judgment and thought content normal. Cognition and memory are normal.  Nursing note and vitals reviewed.   Results for orders placed or performed in visit on 02/22/35  Basic metabolic panel  Result Value Ref Range   Glucose 95 65 - 99 mg/dL   BUN 23 6 - 24 mg/dL   Creatinine, Ser 0.93 0.76 - 1.27 mg/dL   GFR calc non Af Amer 97 >59 mL/min/1.73   GFR calc Af Amer 113 >59 mL/min/1.73    BUN/Creatinine Ratio 25 (H) 9 - 20   Sodium 143 134 - 144 mmol/L   Potassium 3.9 3.5 - 5.2 mmol/L   Chloride 104 96 - 106 mmol/L   CO2 24 20 - 29 mmol/L   Calcium 8.8 8.7 - 10.2 mg/dL  Uric acid  Result Value Ref Range   Uric Acid 9.1 (H) 3.7 - 8.6 mg/dL      Assessment & Plan:   Problem List Items Addressed This Visit      Musculoskeletal and Integument   Acute idiopathic gout involving toe of left foot - Primary    Feeling well. Rechecking levels today. Await results. Call with any concerns.       Relevant Medications   ibuprofen (ADVIL,MOTRIN) 800 MG tablet   Other Relevant Orders   Comprehensive metabolic panel   Uric acid    Other Visit Diagnoses    Contusion of left elbow, initial encounter       No sign of fracture. Will change from meloxicam to ibuprofen. Call with any concerns.        Follow up plan: Return in about 3 months (around 04/06/2018) for Physical, OK to cancel Feb appointment.

## 2018-01-06 NOTE — Patient Instructions (Signed)
STOP MELOXICAM. 

## 2018-01-07 ENCOUNTER — Encounter: Payer: Self-pay | Admitting: Family Medicine

## 2018-01-07 LAB — COMPREHENSIVE METABOLIC PANEL
ALBUMIN: 4.4 g/dL (ref 3.5–5.5)
ALT: 26 IU/L (ref 0–44)
AST: 22 IU/L (ref 0–40)
Albumin/Globulin Ratio: 1.4 (ref 1.2–2.2)
Alkaline Phosphatase: 89 IU/L (ref 39–117)
BUN / CREAT RATIO: 18 (ref 9–20)
BUN: 18 mg/dL (ref 6–24)
Bilirubin Total: 0.7 mg/dL (ref 0.0–1.2)
CHLORIDE: 106 mmol/L (ref 96–106)
CO2: 22 mmol/L (ref 20–29)
Calcium: 9.4 mg/dL (ref 8.7–10.2)
Creatinine, Ser: 1.01 mg/dL (ref 0.76–1.27)
GFR calc non Af Amer: 88 mL/min/{1.73_m2} (ref >59)
GFR, EST AFRICAN AMERICAN: 101 mL/min/{1.73_m2} (ref >59)
GLOBULIN, TOTAL: 3.2 g/dL (ref 1.5–4.5)
Glucose: 88 mg/dL (ref 65–99)
Potassium: 4.6 mmol/L (ref 3.5–5.2)
SODIUM: 146 mmol/L — AB (ref 134–144)
TOTAL PROTEIN: 7.6 g/dL (ref 6.0–8.5)

## 2018-01-07 LAB — URIC ACID: URIC ACID: 7.3 mg/dL (ref 3.7–8.6)

## 2018-01-21 ENCOUNTER — Encounter: Payer: Self-pay | Admitting: Family Medicine

## 2018-01-22 ENCOUNTER — Encounter: Payer: Self-pay | Admitting: Family Medicine

## 2018-01-22 ENCOUNTER — Other Ambulatory Visit: Payer: Self-pay | Admitting: Family Medicine

## 2018-01-22 DIAGNOSIS — M25522 Pain in left elbow: Secondary | ICD-10-CM

## 2018-01-23 ENCOUNTER — Encounter: Payer: Self-pay | Admitting: Family Medicine

## 2018-01-24 ENCOUNTER — Ambulatory Visit (INDEPENDENT_AMBULATORY_CARE_PROVIDER_SITE_OTHER): Payer: Medicaid Other | Admitting: Orthopaedic Surgery

## 2018-01-24 ENCOUNTER — Encounter: Payer: Self-pay | Admitting: Family Medicine

## 2018-01-24 ENCOUNTER — Telehealth: Payer: Self-pay | Admitting: Family Medicine

## 2018-01-24 NOTE — Telephone Encounter (Signed)
Contacted patient and advised that a new referral was sent to [facility] and that someone would contact him next week with an appointment date and time.

## 2018-01-25 ENCOUNTER — Encounter: Payer: Self-pay | Admitting: Family Medicine

## 2018-01-26 NOTE — Telephone Encounter (Signed)
Patient referred to [facility].

## 2018-02-14 ENCOUNTER — Ambulatory Visit: Payer: Medicaid Other | Admitting: Family Medicine

## 2018-03-06 ENCOUNTER — Encounter: Payer: Self-pay | Admitting: Family Medicine

## 2018-03-07 ENCOUNTER — Other Ambulatory Visit: Payer: Self-pay | Admitting: Family Medicine

## 2018-03-07 ENCOUNTER — Encounter: Payer: Self-pay | Admitting: Family Medicine

## 2018-03-07 MED ORDER — KETOCONAZOLE 2 % EX SHAM
1.0000 | MEDICATED_SHAMPOO | CUTANEOUS | 0 refills | Status: DC
Start: 2018-03-10 — End: 2018-10-17

## 2018-03-10 MED ORDER — TRIAMCINOLONE ACETONIDE 0.5 % EX OINT: 1.0000 "application " | TOPICAL_OINTMENT | Freq: Two times a day (BID) | CUTANEOUS | 1 refills | Status: DC

## 2018-03-13 ENCOUNTER — Encounter: Payer: Self-pay | Admitting: Family Medicine

## 2018-03-19 ENCOUNTER — Encounter: Payer: Self-pay | Admitting: Family Medicine

## 2018-03-20 ENCOUNTER — Encounter: Payer: Self-pay | Admitting: Family Medicine

## 2018-03-20 ENCOUNTER — Ambulatory Visit (INDEPENDENT_AMBULATORY_CARE_PROVIDER_SITE_OTHER): Payer: Medicaid Other | Admitting: Family Medicine

## 2018-03-20 VITALS — BP 125/73 | HR 89 | Temp 97.7°F | Wt 188.0 lb

## 2018-03-20 DIAGNOSIS — M10072 Idiopathic gout, left ankle and foot: Secondary | ICD-10-CM | POA: Diagnosis not present

## 2018-03-20 MED ORDER — COLCHICINE 0.6 MG PO TABS: 0.6000 mg | ORAL_TABLET | Freq: Two times a day (BID) | ORAL | 0 refills | Status: DC

## 2018-03-20 NOTE — Progress Notes (Signed)
BP 125/73 (BP Location: Left Arm, Patient Position: Sitting, Cuff Size: Normal)   Pulse 89   Temp 97.7 F (36.5 C) (Oral)   Wt 188 lb (85.3 kg)   SpO2 97%   BMI 32.27 kg/m    Subjective:    Patient ID: Jesus Guerrero, male    DOB: 20-Feb-1971, 47 y.o.   MRN: [id number]  HPI: Jesus Guerrero is a 47 y.o. male  Chief Complaint  Patient presents with  . Gout   GOUT- started on allopurinol 3 months ago for gout Duration: started last night Right 1st metatarsophalangeal pain: no Left 1st metatarsophalangeal pain: yes Right knee pain: no Left knee pain: no Severity: moderate  Quality: sore and aching Swelling: yes Redness: yes Trauma: no Recent dietary change or indiscretion: no Fevers: no Nausea/vomiting: no Status:  better Treatments attempted: cochicine, allopurinol  Relevant past medical, surgical, family and social history reviewed and updated as indicated. Interim medical history since our last visit reviewed. Allergies and medications reviewed and updated.  Review of Systems  Constitutional: Negative.   Respiratory: Negative.   Cardiovascular: Negative.   Musculoskeletal: Positive for arthralgias. Negative for back pain, gait problem, joint swelling, myalgias, neck pain and neck stiffness.  Skin: Positive for color change. Negative for pallor, rash and wound.  Psychiatric/Behavioral: Negative.     Per HPI unless specifically indicated above     Objective:    BP 125/73 (BP Location: Left Arm, Patient Position: Sitting, Cuff Size: Normal)   Pulse 89   Temp 97.7 F (36.5 C) (Oral)   Wt 188 lb (85.3 kg)   SpO2 97%   BMI 32.27 kg/m   Wt Readings from Last 3 Encounters:  03/20/18 188 lb (85.3 kg)  01/06/18 181 lb 7 oz (82.3 kg)  12/02/17 183 lb 5 oz (83.2 kg)    Physical Exam  Constitutional: He is oriented to person, place, and time. He appears well-developed and well-nourished. No distress.  HENT:  Head: Normocephalic and atraumatic.  Right  Ear: Hearing normal.  Left Ear: Hearing normal.  Nose: Nose normal.  Eyes: Conjunctivae and lids are normal. Right eye exhibits no discharge. Left eye exhibits no discharge. No scleral icterus.  Cardiovascular: Normal rate, regular rhythm, normal heart sounds and intact distal pulses. Exam reveals no gallop and no friction rub.  No murmur heard. Pulmonary/Chest: Effort normal and breath sounds normal. No respiratory distress. He has no wheezes. He has no rales. He exhibits no tenderness.  Musculoskeletal: Normal range of motion. He exhibits edema (swelling and redness L great toe) and tenderness.  Neurological: He is alert and oriented to person, place, and time.  Skin: Skin is warm, dry and intact. No rash noted. He is not diaphoretic. No erythema. No pallor.  Psychiatric: He has a normal mood and affect. His speech is normal and behavior is normal. Judgment and thought content normal. Cognition and memory are normal.  Nursing note and vitals reviewed.   Results for orders placed or performed in visit on 01/06/18  Comprehensive metabolic panel  Result Value Ref Range   Glucose 88 65 - 99 mg/dL   BUN 18 6 - 24 mg/dL   Creatinine, Ser 1.01 0.76 - 1.27 mg/dL   GFR calc non Af Amer 88 >59 mL/min/1.73   GFR calc Af Amer 101 >59 mL/min/1.73   BUN/Creatinine Ratio 18 9 - 20   Sodium 146 (H) 134 - 144 mmol/L   Potassium 4.6 3.5 - 5.2 mmol/L   Chloride 106  96 - 106 mmol/L   CO2 22 20 - 29 mmol/L   Calcium 9.4 8.7 - 10.2 mg/dL   Total Protein 7.6 6.0 - 8.5 g/dL   Albumin 4.4 3.5 - 5.5 g/dL   Globulin, Total 3.2 1.5 - 4.5 g/dL   Albumin/Globulin Ratio 1.4 1.2 - 2.2   Bilirubin Total 0.7 0.0 - 1.2 mg/dL   Alkaline Phosphatase 89 39 - 117 IU/L   AST 22 0 - 40 IU/L   ALT 26 0 - 44 IU/L  Uric acid  Result Value Ref Range   Uric Acid 7.3 3.7 - 8.6 mg/dL      Assessment & Plan:   Problem List Items Addressed This Visit      Musculoskeletal and Integument   Acute idiopathic gout  involving toe of left foot - Primary    Will treat with colchicine. Rechecking levels. Call with any concerns.       Relevant Medications   colchicine 0.6 MG tablet   Other Relevant Orders   Uric acid   Comprehensive metabolic panel       Follow up plan: Return As scheduled.

## 2018-03-20 NOTE — Assessment & Plan Note (Signed)
Will treat with colchicine. Rechecking levels. Call with any concerns.

## 2018-03-21 LAB — COMPREHENSIVE METABOLIC PANEL
A/G RATIO: 1.7 (ref 1.2–2.2)
ALT: 18 IU/L (ref 0–44)
AST: 20 IU/L (ref 0–40)
Albumin: 4.3 g/dL (ref 3.5–5.5)
Alkaline Phosphatase: 77 IU/L (ref 39–117)
BUN/Creatinine Ratio: 22 — ABNORMAL HIGH (ref 9–20)
BUN: 27 mg/dL — ABNORMAL HIGH (ref 6–24)
Bilirubin Total: 0.3 mg/dL (ref 0.0–1.2)
CO2: 23 mmol/L (ref 20–29)
Calcium: 10.1 mg/dL (ref 8.7–10.2)
Chloride: 103 mmol/L (ref 96–106)
Creatinine, Ser: 1.24 mg/dL (ref 0.76–1.27)
GFR, EST AFRICAN AMERICAN: 79 mL/min/{1.73_m2} (ref >59)
GFR, EST NON AFRICAN AMERICAN: 68 mL/min/{1.73_m2} (ref >59)
GLOBULIN, TOTAL: 2.6 g/dL (ref 1.5–4.5)
Glucose: 99 mg/dL (ref 65–99)
POTASSIUM: 4.6 mmol/L (ref 3.5–5.2)
Sodium: 141 mmol/L (ref 134–144)
TOTAL PROTEIN: 6.9 g/dL (ref 6.0–8.5)

## 2018-03-21 LAB — URIC ACID: URIC ACID: 6.5 mg/dL (ref 3.7–8.6)

## 2018-04-02 ENCOUNTER — Encounter: Payer: Self-pay | Admitting: Family Medicine

## 2018-04-03 ENCOUNTER — Encounter: Payer: Self-pay | Admitting: Family Medicine

## 2018-04-03 ENCOUNTER — Ambulatory Visit: Payer: Medicaid Other | Admitting: Family Medicine

## 2018-04-03 VITALS — BP 116/66 | HR 75 | Temp 98.0°F | Wt 185.7 lb

## 2018-04-03 DIAGNOSIS — J069 Acute upper respiratory infection, unspecified: Secondary | ICD-10-CM

## 2018-04-03 MED ORDER — PREDNISONE 10 MG PO TABS: ORAL_TABLET | ORAL | 0 refills | Status: DC

## 2018-04-03 MED ORDER — GUAIFENESIN ER 600 MG PO TB12
600.0000 mg | ORAL_TABLET | Freq: Two times a day (BID) | ORAL | 0 refills | Status: DC | PRN
Start: 2018-04-03 — End: 2018-04-15

## 2018-04-03 NOTE — Patient Instructions (Signed)
Follow up as needed

## 2018-04-03 NOTE — Progress Notes (Signed)
BP 116/66 (BP Location: Right Arm, Patient Position: Sitting, Cuff Size: Normal)   Pulse 75   Temp 98 F (36.7 C) (Oral)   Wt 185 lb 11.2 oz (84.2 kg)   SpO2 97%   BMI 31.88 kg/m    Subjective:    Patient ID: Jesus Guerrero, male    DOB: 1971/07/16, 47 y.o.   MRN: [id number]  HPI: Jesus Guerrero is a 47 y.o. male  Chief Complaint  Patient presents with  . Cough    x's 1 week. Patient states his chest is sore from coughing.  . Nasal Congestion   Congestion, fatigue, productive cough, wheezing, chest soreness with coughing x 1 week. Trying dayquil and nyquil with no relief, and is compliant with allergy regimen. Denies fever, chills, sore throat, CP, SOB. No sick contacts.   . Past Medical History:  Diagnosis Date  . Autism   . Constipation   . GERD (gastroesophageal reflux disease)   . Gout   . High cholesterol   . History of kidney stones   . Overweight   . Prostatitis   . Suspicious nevus   . UTI (lower urinary tract infection)    Social History   Socioeconomic History  . Marital status: Single    Spouse name: Not on file  . Number of children: Not on file  . Years of education: Not on file  . Highest education level: Not on file  Occupational History  . Not on file  Social Needs  . Financial resource strain: Not on file  . Food insecurity:    Worry: Not on file    Inability: Not on file  . Transportation needs:    Medical: Not on file    Non-medical: Not on file  Tobacco Use  . Smoking status: Never Smoker  . Smokeless tobacco: Never Used  Substance and Sexual Activity  . Alcohol use: No  . Drug use: No  . Sexual activity: Not on file  Lifestyle  . Physical activity:    Days per week: Not on file    Minutes per session: Not on file  . Stress: Not on file  Relationships  . Social connections:    Talks on phone: Not on file    Gets together: Not on file    Attends religious service: Not on file    Active member of club or organization:  Not on file    Attends meetings of clubs or organizations: Not on file    Relationship status: Not on file  . Intimate partner violence:    Fear of current or ex partner: Not on file    Emotionally abused: Not on file    Physically abused: Not on file    Forced sexual activity: Not on file  Other Topics Concern  . Not on file  Social History Narrative  . Not on file    Relevant past medical, surgical, family and social history reviewed and updated as indicated. Interim medical history since our last visit reviewed. Allergies and medications reviewed and updated.  Review of Systems  Per HPI unless specifically indicated above     Objective:    BP 116/66 (BP Location: Right Arm, Patient Position: Sitting, Cuff Size: Normal)   Pulse 75   Temp 98 F (36.7 C) (Oral)   Wt 185 lb 11.2 oz (84.2 kg)   SpO2 97%   BMI 31.88 kg/m   Wt Readings from Last 3 Encounters:  04/03/18 185 lb 11.2 oz (  84.2 kg)  03/20/18 188 lb (85.3 kg)  01/06/18 181 lb 7 oz (82.3 kg)    Physical Exam  Constitutional: He is oriented to person, place, and time. He appears well-developed and well-nourished. No distress.  HENT:  Head: Atraumatic.  Right Ear: External ear normal.  Left Ear: External ear normal.  Nose: Nose normal.  Mouth/Throat: Oropharynx is clear and moist. No oropharyngeal exudate.  Eyes: Pupils are equal, round, and reactive to light. Conjunctivae are normal.  Neck: Normal range of motion. Neck supple.  Cardiovascular: Normal rate and normal heart sounds.  Pulmonary/Chest: Effort normal and breath sounds normal. No respiratory distress.  Musculoskeletal: Normal range of motion.  Neurological: He is alert and oriented to person, place, and time.  Skin: Skin is warm and dry.  Psychiatric: He has a normal mood and affect. His behavior is normal.  Nursing note and vitals reviewed.   Results for orders placed or performed in visit on 03/20/18  Uric acid  Result Value Ref Range   Uric  Acid 6.5 3.7 - 8.6 mg/dL  Comprehensive metabolic panel  Result Value Ref Range   Glucose 99 65 - 99 mg/dL   BUN 27 (H) 6 - 24 mg/dL   Creatinine, Ser 1.24 0.76 - 1.27 mg/dL   GFR calc non Af Amer 68 >59 mL/min/1.73   GFR calc Af Amer 79 >59 mL/min/1.73   BUN/Creatinine Ratio 22 (H) 9 - 20   Sodium 141 134 - 144 mmol/L   Potassium 4.6 3.5 - 5.2 mmol/L   Chloride 103 96 - 106 mmol/L   CO2 23 20 - 29 mmol/L   Calcium 10.1 8.7 - 10.2 mg/dL   Total Protein 6.9 6.0 - 8.5 g/dL   Albumin 4.3 3.5 - 5.5 g/dL   Globulin, Total 2.6 1.5 - 4.5 g/dL   Albumin/Globulin Ratio 1.7 1.2 - 2.2   Bilirubin Total 0.3 0.0 - 1.2 mg/dL   Alkaline Phosphatase 77 39 - 117 IU/L   AST 20 0 - 40 IU/L   ALT 18 0 - 44 IU/L      Assessment & Plan:   Problem List Items Addressed This Visit    None    Visit Diagnoses    Upper respiratory tract infection, unspecified type    -  Primary   Will tx with prednisone taper, plain mucinex, delsym, and supportive care. Pt to f/u if no improvement over next few days for CXR and re-eval       Follow up plan: Return for as scheduled.

## 2018-04-05 ENCOUNTER — Encounter: Payer: Self-pay | Admitting: Family Medicine

## 2018-04-05 ENCOUNTER — Encounter: Payer: Self-pay | Admitting: Podiatry

## 2018-04-07 ENCOUNTER — Encounter: Payer: Self-pay | Admitting: Family Medicine

## 2018-04-07 NOTE — Telephone Encounter (Signed)
Called and left a message for patient letting him know Dr. Durenda Age response.

## 2018-04-09 ENCOUNTER — Encounter: Payer: Self-pay | Admitting: Family Medicine

## 2018-04-10 ENCOUNTER — Encounter: Payer: Self-pay | Admitting: Family Medicine

## 2018-04-10 NOTE — Telephone Encounter (Signed)
Called and left a message letting the patient know that he will need to be seen.

## 2018-04-14 ENCOUNTER — Encounter: Payer: Self-pay | Admitting: Family Medicine

## 2018-04-15 ENCOUNTER — Ambulatory Visit: Payer: Medicaid Other | Admitting: Family Medicine

## 2018-04-15 ENCOUNTER — Encounter: Payer: Self-pay | Admitting: Family Medicine

## 2018-04-15 VITALS — BP 148/72 | HR 79 | Temp 97.6°F | Ht 64.4 in | Wt 186.3 lb

## 2018-04-15 DIAGNOSIS — E782 Mixed hyperlipidemia: Secondary | ICD-10-CM

## 2018-04-15 DIAGNOSIS — L309 Dermatitis, unspecified: Secondary | ICD-10-CM

## 2018-04-15 DIAGNOSIS — B029 Zoster without complications: Secondary | ICD-10-CM

## 2018-04-15 DIAGNOSIS — K219 Gastro-esophageal reflux disease without esophagitis: Secondary | ICD-10-CM

## 2018-04-15 DIAGNOSIS — Z0000 Encounter for general adult medical examination without abnormal findings: Secondary | ICD-10-CM | POA: Diagnosis not present

## 2018-04-15 DIAGNOSIS — S91209A Unspecified open wound of unspecified toe(s) with damage to nail, initial encounter: Secondary | ICD-10-CM

## 2018-04-15 DIAGNOSIS — R3129 Other microscopic hematuria: Secondary | ICD-10-CM

## 2018-04-15 DIAGNOSIS — M10072 Idiopathic gout, left ankle and foot: Secondary | ICD-10-CM

## 2018-04-15 LAB — MICROSCOPIC EXAMINATION: BACTERIA UA: NONE SEEN

## 2018-04-15 LAB — UA/M W/RFLX CULTURE, ROUTINE
BILIRUBIN UA: NEGATIVE
Glucose, UA: NEGATIVE
KETONES UA: NEGATIVE
Leukocytes, UA: NEGATIVE
NITRITE UA: NEGATIVE
Protein, UA: NEGATIVE
Urobilinogen, Ur: 0.2 mg/dL (ref 0.2–1.0)
pH, UA: 5.5 (ref 5.0–7.5)

## 2018-04-15 MED ORDER — MONTELUKAST SODIUM 10 MG PO TABS: 10.0000 mg | ORAL_TABLET | Freq: Every day | ORAL | 1 refills | Status: DC

## 2018-04-15 MED ORDER — ALLOPURINOL 100 MG PO TABS: 100.0000 mg | ORAL_TABLET | Freq: Every day | ORAL | 3 refills | Status: DC

## 2018-04-15 MED ORDER — ATORVASTATIN CALCIUM 20 MG PO TABS: 20.0000 mg | ORAL_TABLET | Freq: Every day | ORAL | 1 refills | Status: DC

## 2018-04-15 MED ORDER — VALACYCLOVIR HCL 1 G PO TABS: 1000.0000 mg | ORAL_TABLET | Freq: Two times a day (BID) | ORAL | 0 refills | Status: DC

## 2018-04-15 NOTE — Assessment & Plan Note (Signed)
Stable. Continue current regimen. Continue to monitor. Call with any concerns.  

## 2018-04-15 NOTE — Assessment & Plan Note (Signed)
Stable. Continue current regimen. Continue to monitor. Call with any concerns. Rechecking uric acid today. Refills given.

## 2018-04-15 NOTE — Patient Instructions (Addendum)
Health Maintenance, Male A healthy lifestyle and preventive care is important for your health and wellness. Ask your health care provider about what schedule of regular examinations is right for you. What should I know about weight and diet? Eat a Healthy Diet  Eat plenty of vegetables, fruits, whole grains, low-fat dairy products, and lean protein.  Do not eat a lot of foods high in solid fats, added sugars, or salt.  Maintain a Healthy Weight Regular exercise can help you achieve or maintain a healthy weight. You should:  Do at least 150 minutes of exercise each week. The exercise should increase your heart rate and make you sweat (moderate-intensity exercise).  Do strength-training exercises at least twice a week.  Watch Your Levels of Cholesterol and Blood Lipids  Have your blood tested for lipids and cholesterol every 5 years starting at 48 years of age. If you are at high risk for heart disease, you should start having your blood tested when you are 48 years old. You may need to have your cholesterol levels checked more often if: ? Your lipid or cholesterol levels are high. ? You are older than 48 years of age. ? You are at high risk for heart disease.  What should I know about cancer screening? Many types of cancers can be detected early and may often be prevented. Lung Cancer  You should be screened every year for lung cancer if: ? You are a current smoker who has smoked for at least 30 years. ? You are a former smoker who has quit within the past 15 years.  Talk to your health care provider about your screening options, when you should start screening, and how often you should be screened.  Colorectal Cancer  Routine colorectal cancer screening usually begins at 48 years of age and should be repeated every 5-10 years until you are 48 years old. You may need to be screened more often if early forms of precancerous polyps or small growths are found. Your health care provider  may recommend screening at an earlier age if you have risk factors for colon cancer.  Your health care provider may recommend using home test kits to check for hidden blood in the stool.  A small camera at the end of a tube can be used to examine your colon (sigmoidoscopy or colonoscopy). This checks for the earliest forms of colorectal cancer.  Prostate and Testicular Cancer  Depending on your age and overall health, your health care provider may do certain tests to screen for prostate and testicular cancer.  Talk to your health care provider about any symptoms or concerns you have about testicular or prostate cancer.  Skin Cancer  Check your skin from head to toe regularly.  Tell your health care provider about any new moles or changes in moles, especially if: ? There is a change in a mole's size, shape, or color. ? You have a mole that is larger than a pencil eraser.  Always use sunscreen. Apply sunscreen liberally and repeat throughout the day.  Protect yourself by wearing long sleeves, pants, a wide-brimmed hat, and sunglasses when outside.  What should I know about heart disease, diabetes, and high blood pressure?  If you are 18-39 years of age, have your blood pressure checked every 3-5 years. If you are 40 years of age or older, have your blood pressure checked every year. You should have your blood pressure measured twice-once when you are at a hospital or clinic, and once when   you are not at a hospital or clinic. Record the average of the two measurements. To check your blood pressure when you are not at a hospital or clinic, you can use: ? An automated blood pressure machine at a pharmacy. ? A home blood pressure monitor.  Talk to your health care provider about your target blood pressure.  If you are between 60-38 years old, ask your health care provider if you should take aspirin to prevent heart disease.  Have regular diabetes screenings by checking your fasting blood  sugar level. ? If you are at a normal weight and have a low risk for diabetes, have this test once every three years after the age of 74. ? If you are overweight and have a high risk for diabetes, consider being tested at a younger age or more often.  A one-time screening for abdominal aortic aneurysm (AAA) by ultrasound is recommended for men aged 46-75 years who are current or former smokers. What should I know about preventing infection? Hepatitis B If you have a higher risk for hepatitis B, you should be screened for this virus. Talk with your health care provider to find out if you are at risk for hepatitis B infection. Hepatitis C Blood testing is recommended for:  Everyone born from [birth year] through 1966.  Anyone with known risk factors for hepatitis C.  Sexually Transmitted Diseases (STDs)  You should be screened each year for STDs including gonorrhea and chlamydia if: ? You are sexually active and are younger than 48 years of age. ? You are older than 48 years of age and your health care provider tells you that you are at risk for this type of infection. ? Your sexual activity has changed since you were last screened and you are at an increased risk for chlamydia or gonorrhea. Ask your health care provider if you are at risk.  Talk with your health care provider about whether you are at high risk of being infected with HIV. Your health care provider may recommend a prescription medicine to help prevent HIV infection.  What else can I do?  Schedule regular health, dental, and eye exams.  Stay current with your vaccines (immunizations).  Do not use any tobacco products, such as cigarettes, chewing tobacco, and e-cigarettes. If you need help quitting, ask your health care provider.  Limit alcohol intake to no more than 2 drinks per day. One drink equals 12 ounces of beer, 5 ounces of wine, or 1 ounces of hard liquor.  Do not use street drugs.  Do not share needles.  Ask your  health care provider for help if you need support or information about quitting drugs.  Tell your health care provider if you often feel depressed.  Tell your health care provider if you have ever been abused or do not feel safe at home. This information is not intended to replace advice given to you by your health care provider. Make sure you discuss any questions you have with your health care provider. Document Released: 06/14/2008 Document Revised: 08/15/2016 Document Reviewed: 09/20/2015 Elsevier Interactive Patient Education  2018 [place], which is also known as herpes zoster, is an infection that causes a painful skin rash and fluid-filled blisters. Shingles is not related to genital herpes, which is a sexually transmitted infection. Shingles only develops in people who:  Have had chickenpox.  Have received the chickenpox vaccine. (This is rare.)  What are the causes? Shingles is caused by varicella-zoster virus (  VZV). This is the same virus that causes chickenpox. After exposure to VZV, the virus stays in the body in an inactive (dormant) state. Shingles develops if the virus reactivates. This can happen many years after the initial exposure to VZV. It is not known what causes this virus to reactivate. What increases the risk? People who have had chickenpox or received the chickenpox vaccine are at risk for shingles. Infection is more common in people who:  Are older than age 36.  Have a weakened defense (immune) system, such as those with HIV, AIDS, or cancer.  Are taking medicines that weaken the immune system, such as transplant medicines.  Are under great stress.  What are the signs or symptoms? Early symptoms of this condition include itching, tingling, and pain in an area on your skin. Pain may be described as burning, stabbing, or throbbing. A few days or weeks after symptoms start, a painful red rash appears, usually on one side of the body in  a bandlike or beltlike pattern. The rash eventually turns into fluid-filled blisters that break open, scab over, and dry up in about 2-3 weeks. At any time during the infection, you may also develop:  A fever.  Chills.  A headache.  An upset stomach.  How is this diagnosed? This condition is diagnosed with a skin exam. Sometimes, skin or fluid samples are taken from the blisters before a diagnosis is made. These samples are examined under a microscope or sent to a lab for testing. How is this treated? There is no specific cure for this condition. Your health care provider will probably prescribe medicines to help you manage pain, recover more quickly, and avoid long-term problems. Medicines may include:  Antiviral drugs.  Anti-inflammatory drugs.  Pain medicines.  If the area involved is on your face, you may be referred to a specialist, such as an eye doctor (ophthalmologist) or an ear, nose, and throat (ENT) doctor to help you avoid eye problems, chronic pain, or disability. Follow these instructions at home: Medicines  Take medicines only as directed by your health care provider.  Apply an anti-itch or numbing cream to the affected area as directed by your health care provider. Blister and Rash Care  Take a cool bath or apply cool compresses to the area of the rash or blisters as directed by your health care provider. This may help with pain and itching.  Keep your rash covered with a loose bandage (dressing). Wear loose-fitting clothing to help ease the pain of material rubbing against the rash.  Keep your rash and blisters clean with mild soap and cool water or as directed by your health care provider.  Check your rash every day for signs of infection. These include redness, swelling, and pain that lasts or increases.  Do not pick your blisters.  Do not scratch your rash. General instructions  Rest as directed by your health care provider.  Keep all follow-up visits  as directed by your health care provider. This is important.  Until your blisters scab over, your infection can cause chickenpox in people who have never had it or been vaccinated against it. To prevent this from happening, avoid contact with other people, especially: ? Babies. ? Pregnant women. ? Children who have eczema. ? Elderly people who have transplants. ? People who have chronic illnesses, such as leukemia or AIDS. Contact a health care provider if:  Your pain is not relieved with prescribed medicines.  Your pain does not get better after  the rash heals.  Your rash looks infected. Signs of infection include redness, swelling, and pain that lasts or increases. Get help right away if:  The rash is on your face or nose.  You have facial pain, pain around your eye area, or loss of feeling on one side of your face.  You have ear pain or you have ringing in your ear.  You have loss of taste.  Your condition gets worse. This information is not intended to replace advice given to you by your health care provider. Make sure you discuss any questions you have with your health care provider. Document Released: 12/17/2005 Document Revised: 08/12/2016 Document Reviewed: 10/28/2014 Elsevier Interactive Patient Education  2018 [place].  Nail Bed Injury The nail bed is the soft tissue under a fingernail or toenail that is the origin of new nail growth. Various types of injuries can occur at the nail bed. These injuries may involve bruising or bleeding under the nail, cuts (lacerations) in the nail or nail bed, or loss of a part of the nail or the whole nail (avulsion). In some cases, a nail bed injury accompanies another injury, such as a break (fracture) of the bone at the tip of the finger or toe. The nail bed includes the growth center of the nail. If this growth center is damaged, the injured nail may not grow back normally, or it may not grow at all. The regrown nail might have an  abnormal shape or appearance. It can take several months for a damaged or torn-off nail to regrow. Depending on the nature and extent of the nail bed injury, there may be a permanent disruption of normal nail growth. What are the causes? This condition is usually caused by crushing, pinching, cutting, or tearing injuries of the fingertip or toe. These injuries may occur when a finger or toe gets caught in a door, hit by a hammer, or damaged in accidents involving electrical tools or power machinery. What are the signs or symptoms? Symptoms may vary depending on the type of injury. Symptoms may include:  Pain in the injured area.  Bleeding.  Swelling.  Discoloration.  Collection of blood under the nail (hematoma).  Deformed or split nail.  A loose nail that is not stuck to the nail bed.  Loss of all or part of the nail.  How is this diagnosed? This condition is diagnosed based on:  Your medical history. You will be asked how the injury occurred.  A physical exam. Your health care provider will see if your nail is loose or if there is a laceration of the nail bed.  X-rays may be done to see if you have a fracture.  Your health care provider might also check for conditions that may affect healing, such as diabetes, nerve problems, or poor circulation. How is this treated? Treatment for this condition may depend on the type of injury. Sometimes, the injury may not require any treatment other than keeping the area clean and free of infection. Treatment may include:  Draining the collection of blood from under the nail. This can be done by making a small hole in the nail.  Removing all or part of your nail. This might be necessary in order to stitch (suture) any laceration in the nail bed.  Depending on the location and size of the nail bed injury, a torn off (avulsed) nail is sometimes stitched back in place to provide temporary protection to the nail bed until the new  nail grows  in.  Applying bandages (dressings) or splints to the area.  Antibiotic medicine to help prevent infection.  Pain medicine.  Receiving a tetanus shot. You may need a tetanus shot if: ? You cannot remember when you had your last tetanus shot. ? You have never had a tetanus shot. ? The injury broke your skin and you have not had a tetanus booster during the past 10 years.  For certain injuries, your health care provider may direct you to see a hand or foot specialist. Follow these instructions at home: Managing pain, stiffness, and swelling  Raise (elevate) the injured area above the level of your heart while you are sitting or lying down.  Keep your injury protected with dressings or splints as told by your health care provider.  Keep any dressings clean and dry. Change or remove your dressings only as told by your health care provider.  For an injured toenail: ? Limit walking on your injured leg. ? Wear an open-toed shoe when you walk. ? Try to avoid letting your leg hang down (dangle) while you are sitting or lying down. General instructions  Take over-the-counter and prescription medicines only as told by your health care provider.  If you were prescribed an antibiotic medicine, use it as told by your health care provider. Do not stop using the antibiotic even if you start to feel better.  Do not drive or use heavy machinery while taking prescription pain medicine.  Keep all follow-up visits as told by your health care provider. This is important. Contact a health care provider if:  You have pain that is not controlled with medicine.  You have more pain, drainage, or bleeding in the injured area.  You have redness, soreness, and swelling in the injured area.  You have a fever and your symptoms get worse. Get help right away if:  You have numbness or a blue discoloration of your finger or toe. Summary  The nail bed is the soft tissue under a fingernail or toenail  that includes the growth center of the nail. If this growth center is damaged, the injured nail may not grow back normally, or it may not grow at all.  Raise (elevate) the injured area above the level of your heart while you are sitting or lying down.  Keep any dressings clean and dry. Change or remove your dressings only as told by your health care provider.  Take over-the-counter and prescription medicines only as told by your health care provider. This information is not intended to replace advice given to you by your health care provider. Make sure you discuss any questions you have with your health care provider. Document Released: 01/24/2005 Document Revised: 11/07/2016 Document Reviewed: 11/07/2016 Elsevier Interactive Patient Education  2017 [place] Maintenance, Male A healthy lifestyle and preventive care is important for your health and wellness. Ask your health care provider about what schedule of regular examinations is right for you. What should I know about weight and diet? Eat a Healthy Diet  Eat plenty of vegetables, fruits, whole grains, low-fat dairy products, and lean protein.  Do not eat a lot of foods high in solid fats, added sugars, or salt.  Maintain a Healthy Weight Regular exercise can help you achieve or maintain a healthy weight. You should:  Do at least 150 minutes of exercise each week. The exercise should increase your heart rate and make you sweat (moderate-intensity exercise).  Do strength-training exercises at least twice a  week.  Watch Your Levels of Cholesterol and Blood Lipids  Have your blood tested for lipids and cholesterol every 5 years starting at 48 years of age. If you are at high risk for heart disease, you should start having your blood tested when you are 48 years old. You may need to have your cholesterol levels checked more often if: ? Your lipid or cholesterol levels are high. ? You are older than 48 years of age. ? You  are at high risk for heart disease.  What should I know about cancer screening? Many types of cancers can be detected early and may often be prevented. Lung Cancer  You should be screened every year for lung cancer if: ? You are a current smoker who has smoked for at least 30 years. ? You are a former smoker who has quit within the past 15 years.  Talk to your health care provider about your screening options, when you should start screening, and how often you should be screened.  Colorectal Cancer  Routine colorectal cancer screening usually begins at 48 years of age and should be repeated every 5-10 years until you are 48 years old. You may need to be screened more often if early forms of precancerous polyps or small growths are found. Your health care provider may recommend screening at an earlier age if you have risk factors for colon cancer.  Your health care provider may recommend using home test kits to check for hidden blood in the stool.  A small camera at the end of a tube can be used to examine your colon (sigmoidoscopy or colonoscopy). This checks for the earliest forms of colorectal cancer.  Prostate and Testicular Cancer  Depending on your age and overall health, your health care provider may do certain tests to screen for prostate and testicular cancer.  Talk to your health care provider about any symptoms or concerns you have about testicular or prostate cancer.  Skin Cancer  Check your skin from head to toe regularly.  Tell your health care provider about any new moles or changes in moles, especially if: ? There is a change in a mole's size, shape, or color. ? You have a mole that is larger than a pencil eraser.  Always use sunscreen. Apply sunscreen liberally and repeat throughout the day.  Protect yourself by wearing long sleeves, pants, a wide-brimmed hat, and sunglasses when outside.  What should I know about heart disease, diabetes, and high blood  pressure?  If you are 68-70 years of age, have your blood pressure checked every 3-5 years. If you are 72 years of age or older, have your blood pressure checked every year. You should have your blood pressure measured twice-once when you are at a hospital or clinic, and once when you are not at a hospital or clinic. Record the average of the two measurements. To check your blood pressure when you are not at a hospital or clinic, you can use: ? An automated blood pressure machine at a pharmacy. ? A home blood pressure monitor.  Talk to your health care provider about your target blood pressure.  If you are between 60-38 years old, ask your health care provider if you should take aspirin to prevent heart disease.  Have regular diabetes screenings by checking your fasting blood sugar level. ? If you are at a normal weight and have a low risk for diabetes, have this test once every three years after the age of 72. ?  If you are overweight and have a high risk for diabetes, consider being tested at a younger age or more often.  A one-time screening for abdominal aortic aneurysm (AAA) by ultrasound is recommended for men aged 60-75 years who are current or former smokers. What should I know about preventing infection? Hepatitis B If you have a higher risk for hepatitis B, you should be screened for this virus. Talk with your health care provider to find out if you are at risk for hepatitis B infection. Hepatitis C Blood testing is recommended for:  Everyone born from [birth year] through 1966.  Anyone with known risk factors for hepatitis C.  Sexually Transmitted Diseases (STDs)  You should be screened each year for STDs including gonorrhea and chlamydia if: ? You are sexually active and are younger than 48 years of age. ? You are older than 48 years of age and your health care provider tells you that you are at risk for this type of infection. ? Your sexual activity has changed since you were last  screened and you are at an increased risk for chlamydia or gonorrhea. Ask your health care provider if you are at risk.  Talk with your health care provider about whether you are at high risk of being infected with HIV. Your health care provider may recommend a prescription medicine to help prevent HIV infection.  What else can I do?  Schedule regular health, dental, and eye exams.  Stay current with your vaccines (immunizations).  Do not use any tobacco products, such as cigarettes, chewing tobacco, and e-cigarettes. If you need help quitting, ask your health care provider.  Limit alcohol intake to no more than 2 drinks per day. One drink equals 12 ounces of beer, 5 ounces of wine, or 1 ounces of hard liquor.  Do not use street drugs.  Do not share needles.  Ask your health care provider for help if you need support or information about quitting drugs.  Tell your health care provider if you often feel depressed.  Tell your health care provider if you have ever been abused or do not feel safe at home. This information is not intended to replace advice given to you by your health care provider. Make sure you discuss any questions you have with your health care provider. Document Released: 06/14/2008 Document Revised: 08/15/2016 Document Reviewed: 09/20/2015 Elsevier Interactive Patient Education  2018 [place], which is also known as herpes zoster, is an infection that causes a painful skin rash and fluid-filled blisters. Shingles is not related to genital herpes, which is a sexually transmitted infection. Shingles only develops in people who:  Have had chickenpox.  Have received the chickenpox vaccine. (This is rare.)  What are the causes? Shingles is caused by varicella-zoster virus (VZV). This is the same virus that causes chickenpox. After exposure to VZV, the virus stays in the body in an inactive (dormant) state. Shingles develops if the virus  reactivates. This can happen many years after the initial exposure to VZV. It is not known what causes this virus to reactivate. What increases the risk? People who have had chickenpox or received the chickenpox vaccine are at risk for shingles. Infection is more common in people who:  Are older than age 42.  Have a weakened defense (immune) system, such as those with HIV, AIDS, or cancer.  Are taking medicines that weaken the immune system, such as transplant medicines.  Are under great stress.  What are the signs  or symptoms? Early symptoms of this condition include itching, tingling, and pain in an area on your skin. Pain may be described as burning, stabbing, or throbbing. A few days or weeks after symptoms start, a painful red rash appears, usually on one side of the body in a bandlike or beltlike pattern. The rash eventually turns into fluid-filled blisters that break open, scab over, and dry up in about 2-3 weeks. At any time during the infection, you may also develop:  A fever.  Chills.  A headache.  An upset stomach.  How is this diagnosed? This condition is diagnosed with a skin exam. Sometimes, skin or fluid samples are taken from the blisters before a diagnosis is made. These samples are examined under a microscope or sent to a lab for testing. How is this treated? There is no specific cure for this condition. Your health care provider will probably prescribe medicines to help you manage pain, recover more quickly, and avoid long-term problems. Medicines may include:  Antiviral drugs.  Anti-inflammatory drugs.  Pain medicines.  If the area involved is on your face, you may be referred to a specialist, such as an eye doctor (ophthalmologist) or an ear, nose, and throat (ENT) doctor to help you avoid eye problems, chronic pain, or disability. Follow these instructions at home: Medicines  Take medicines only as directed by your health care provider.  Apply an  anti-itch or numbing cream to the affected area as directed by your health care provider. Blister and Rash Care  Take a cool bath or apply cool compresses to the area of the rash or blisters as directed by your health care provider. This may help with pain and itching.  Keep your rash covered with a loose bandage (dressing). Wear loose-fitting clothing to help ease the pain of material rubbing against the rash.  Keep your rash and blisters clean with mild soap and cool water or as directed by your health care provider.  Check your rash every day for signs of infection. These include redness, swelling, and pain that lasts or increases.  Do not pick your blisters.  Do not scratch your rash. General instructions  Rest as directed by your health care provider.  Keep all follow-up visits as directed by your health care provider. This is important.  Until your blisters scab over, your infection can cause chickenpox in people who have never had it or been vaccinated against it. To prevent this from happening, avoid contact with other people, especially: ? Babies. ? Pregnant women. ? Children who have eczema. ? Elderly people who have transplants. ? People who have chronic illnesses, such as leukemia or AIDS. Contact a health care provider if:  Your pain is not relieved with prescribed medicines.  Your pain does not get better after the rash heals.  Your rash looks infected. Signs of infection include redness, swelling, and pain that lasts or increases. Get help right away if:  The rash is on your face or nose.  You have facial pain, pain around your eye area, or loss of feeling on one side of your face.  You have ear pain or you have ringing in your ear.  You have loss of taste.  Your condition gets worse. This information is not intended to replace advice given to you by your health care provider. Make sure you discuss any questions you have with your health care  provider. Document Released: 12/17/2005 Document Revised: 08/12/2016 Document Reviewed: 10/28/2014 Elsevier Interactive Patient Education  2018  [employer].  Nail Bed Injury The nail bed is the soft tissue under a fingernail or toenail that is the origin of new nail growth. Various types of injuries can occur at the nail bed. These injuries may involve bruising or bleeding under the nail, cuts (lacerations) in the nail or nail bed, or loss of a part of the nail or the whole nail (avulsion). In some cases, a nail bed injury accompanies another injury, such as a break (fracture) of the bone at the tip of the finger or toe. The nail bed includes the growth center of the nail. If this growth center is damaged, the injured nail may not grow back normally, or it may not grow at all. The regrown nail might have an abnormal shape or appearance. It can take several months for a damaged or torn-off nail to regrow. Depending on the nature and extent of the nail bed injury, there may be a permanent disruption of normal nail growth. What are the causes? This condition is usually caused by crushing, pinching, cutting, or tearing injuries of the fingertip or toe. These injuries may occur when a finger or toe gets caught in a door, hit by a hammer, or damaged in accidents involving electrical tools or power machinery. What are the signs or symptoms? Symptoms may vary depending on the type of injury. Symptoms may include:  Pain in the injured area.  Bleeding.  Swelling.  Discoloration.  Collection of blood under the nail (hematoma).  Deformed or split nail.  A loose nail that is not stuck to the nail bed.  Loss of all or part of the nail.  How is this diagnosed? This condition is diagnosed based on:  Your medical history. You will be asked how the injury occurred.  A physical exam. Your health care provider will see if your nail is loose or if there is a laceration of the nail bed.  X-rays may be  done to see if you have a fracture.  Your health care provider might also check for conditions that may affect healing, such as diabetes, nerve problems, or poor circulation. How is this treated? Treatment for this condition may depend on the type of injury. Sometimes, the injury may not require any treatment other than keeping the area clean and free of infection. Treatment may include:  Draining the collection of blood from under the nail. This can be done by making a small hole in the nail.  Removing all or part of your nail. This might be necessary in order to stitch (suture) any laceration in the nail bed.  Depending on the location and size of the nail bed injury, a torn off (avulsed) nail is sometimes stitched back in place to provide temporary protection to the nail bed until the new nail grows in.  Applying bandages (dressings) or splints to the area.  Antibiotic medicine to help prevent infection.  Pain medicine.  Receiving a tetanus shot. You may need a tetanus shot if: ? You cannot remember when you had your last tetanus shot. ? You have never had a tetanus shot. ? The injury broke your skin and you have not had a tetanus booster during the past 10 years.  For certain injuries, your health care provider may direct you to see a hand or foot specialist. Follow these instructions at home: Managing pain, stiffness, and swelling  Raise (elevate) the injured area above the level of your heart while you are sitting or lying down.  Keep  your injury protected with dressings or splints as told by your health care provider.  Keep any dressings clean and dry. Change or remove your dressings only as told by your health care provider.  For an injured toenail: ? Limit walking on your injured leg. ? Wear an open-toed shoe when you walk. ? Try to avoid letting your leg hang down (dangle) while you are sitting or lying down. General instructions  Take over-the-counter and  prescription medicines only as told by your health care provider.  If you were prescribed an antibiotic medicine, use it as told by your health care provider. Do not stop using the antibiotic even if you start to feel better.  Do not drive or use heavy machinery while taking prescription pain medicine.  Keep all follow-up visits as told by your health care provider. This is important. Contact a health care provider if:  You have pain that is not controlled with medicine.  You have more pain, drainage, or bleeding in the injured area.  You have redness, soreness, and swelling in the injured area.  You have a fever and your symptoms get worse. Get help right away if:  You have numbness or a blue discoloration of your finger or toe. Summary  The nail bed is the soft tissue under a fingernail or toenail that includes the growth center of the nail. If this growth center is damaged, the injured nail may not grow back normally, or it may not grow at all.  Raise (elevate) the injured area above the level of your heart while you are sitting or lying down.  Keep any dressings clean and dry. Change or remove your dressings only as told by your health care provider.  Take over-the-counter and prescription medicines only as told by your health care provider. This information is not intended to replace advice given to you by your health care provider. Make sure you discuss any questions you have with your health care provider. Document Released: 01/24/2005 Document Revised: 11/07/2016 Document Reviewed: 11/07/2016 Elsevier Interactive Patient Education  2017 [employer].

## 2018-04-15 NOTE — Assessment & Plan Note (Signed)
Stable. Continue current regimen. Continue to monitor. Call with any concerns. Rechecking levels today. Refill of atorvastatin given.

## 2018-04-15 NOTE — Assessment & Plan Note (Signed)
Stable. Continue current regimen. Continue to monitor. Call with any concerns. Continue to follow with urology.

## 2018-04-15 NOTE — Progress Notes (Signed)
BP (!) 148/72 (BP Location: Left Arm, Patient Position: Sitting, Cuff Size: Normal)   Pulse 79   Temp 97.6 F (36.4 C)   Ht 5' 4.4" (1.636 m)   Wt 186 lb 5 oz (84.5 kg)   SpO2 99%   BMI 31.58 kg/m    Subjective:    Patient ID: Jesus Guerrero, male    DOB: 12-14-1971, 47 y.o.   MRN: [id number]  HPI: Jesus Guerrero is a 47 y.o. male presenting on 04/15/2018 for comprehensive medical examination. Current medical complaints include:  TOE PAIN- toenail came completely off.  Duration: 2 weeks Involved toe: rightpinky toe  Mechanism of injury: unknown Onset: sudden Severity: mild  Quality: sore Frequency: intermittent Radiation: no Aggravating factors: walking  Alleviating factors: nothing  Status: better Treatments attempted: nothing  Relief with NSAIDs?: No NSAIDs Taken Morning stiffness: no Redness: yes  Bruising: no Swelling: no Paresthesias / decreased sensation: no Fevers: no  HYPERLIPIDEMIA Hyperlipidemia status: excellent compliance Satisfied with current treatment?  yes Side effects:  no Medication compliance: excellent compliance Past cholesterol meds: atorvastain (lipitor) Supplements: none Aspirin:  no The 10-year ASCVD risk score [name], et al., 2013) is: 2.7%   Values used to calculate the score:     Age: 65 years     Sex: Male     Is Non-Hispanic African American: No     Diabetic: No     Tobacco smoker: No     Systolic Blood Pressure: 222 mmHg     Is BP treated: No     HDL Cholesterol: 45 mg/dL     Total Cholesterol: 152 mg/dL Chest pain:  no Coronary artery disease:  no  GOUT Duration:chronic Right 1st metatarsophalangeal pain: no Left 1st metatarsophalangeal pain: no Right knee pain: no Left knee pain: no Swelling: no Redness: no Trauma: no Recent dietary change or indiscretion: no Fevers: no Nausea/vomiting: no Status:  stable  He [facility]: [facility] Interim Problems from his last visit: no  Depression  Screen done today and results listed below:  Depression screen [facility] 2/9 03/20/2018 03/07/2017 01/13/2016  Decreased Interest 0 0 0  Down, Depressed, Hopeless 0 0 0  PHQ - 2 Score 0 0 0  Altered sleeping 0 - -  Tired, decreased energy 0 - -  Change in appetite 0 - -  Feeling bad or failure about yourself  0 - -  Trouble concentrating 0 - -  Moving slowly or fidgety/restless 0 - -  Suicidal thoughts 0 - -  PHQ-9 Score 0 - -    Past Medical History:  Past Medical History:  Diagnosis Date  . Autism   . Constipation   . GERD (gastroesophageal reflux disease)   . Gout   . High cholesterol   . History of kidney stones   . Overweight   . Prostatitis   . Suspicious nevus   . UTI (lower urinary tract infection)     Surgical History:  Past Surgical History:  Procedure Laterality Date  . CHOLECYSTECTOMY  2014  . CYSTOSCOPY W/ RETROGRADES Bilateral 06/29/2015   Procedure: CYSTOSCOPY WITH RETROGRADE PYELOGRAM;  Surgeon: Hollice Espy, MD;  Location: [facility] ORS;  Service: Urology;  Laterality: Bilateral;    Medications:  Current Outpatient Medications on File Prior to Visit  Medication Sig  . acetaminophen (TYLENOL) 500 MG tablet Take 500 mg by mouth every 6 (six) hours as needed.  Marland Kitchen ketoconazole (NIZORAL) 2 % shampoo Apply 1 application topically 2 (two)  times a week.  . meloxicam (MOBIC) 15 MG tablet Take 1 tablet (15 mg total) by mouth daily.  . mupirocin nasal ointment (BACTROBAN) 2 % Apply to spot on chest 2x a day  . triamcinolone ointment (KENALOG) 0.5 % Apply 1 application topically 2 (two) times daily.   Current Facility-Administered Medications on File Prior to Visit  Medication  . betamethasone acetate-betamethasone sodium phosphate (CELESTONE) injection 12 mg  . betamethasone acetate-betamethasone sodium phosphate (CELESTONE) injection 3 mg  . betamethasone acetate-betamethasone sodium phosphate (CELESTONE) injection 3 mg    Allergies:  Allergies  Allergen Reactions  .  Bactrim [Sulfamethoxazole-Trimethoprim] Rash  . Codeine Swelling    Social History:  Social History   Socioeconomic History  . Marital status: Single    Spouse name: Not on file  . Number of children: Not on file  . Years of education: Not on file  . Highest education level: Not on file  Occupational History  . Not on file  Social Needs  . Financial resource strain: Not on file  . Food insecurity:    Worry: Not on file    Inability: Not on file  . Transportation needs:    Medical: Not on file    Non-medical: Not on file  Tobacco Use  . Smoking status: Never Smoker  . Smokeless tobacco: Never Used  Substance and Sexual Activity  . Alcohol use: No  . Drug use: No  . Sexual activity: Not on file  Lifestyle  . Physical activity:    Days per week: Not on file    Minutes per session: Not on file  . Stress: Not on file  Relationships  . Social connections:    Talks on phone: Not on file    Gets together: Not on file    Attends religious service: Not on file    Active member of club or organization: Not on file    Attends meetings of clubs or organizations: Not on file    Relationship status: Not on file  . Intimate partner violence:    Fear of current or ex partner: Not on file    Emotionally abused: Not on file    Physically abused: Not on file    Forced sexual activity: Not on file  Other Topics Concern  . Not on file  Social History Narrative  . Not on file   Social History   Tobacco Use  Smoking Status Never Smoker  Smokeless Tobacco Never Used   Social History   Substance and Sexual Activity  Alcohol Use No    Family History:  Family History  Problem Relation Age of Onset  . Congestive Heart Failure Father   . Kidney failure Father   . Hematuria Neg Hx   . Kidney Stones Neg Hx   . Prostate cancer Neg Hx     Past medical history, surgical history, medications, allergies, family history and social history reviewed with patient today and changes  made to appropriate areas of the chart.   Review of Systems  Constitutional: Negative.   HENT: Negative.   Eyes: Negative.   Respiratory: Negative.   Cardiovascular: Negative.   Gastrointestinal: Negative.   Genitourinary: Negative.   Musculoskeletal: Negative.   Skin: Positive for rash. Negative for itching.  Neurological: Negative.   Endo/Heme/Allergies: Positive for environmental allergies. Negative for polydipsia. Does not bruise/bleed easily.  Psychiatric/Behavioral: Negative.     All other ROS negative except what is listed above and in the HPI.  Objective:    BP (!) 148/72 (BP Location: Left Arm, Patient Position: Sitting, Cuff Size: Normal)   Pulse 79   Temp 97.6 F (36.4 C)   Ht 5' 4.4" (1.636 m)   Wt 186 lb 5 oz (84.5 kg)   SpO2 99%   BMI 31.58 kg/m   Wt Readings from Last 3 Encounters:  04/15/18 186 lb 5 oz (84.5 kg)  04/03/18 185 lb 11.2 oz (84.2 kg)  03/20/18 188 lb (85.3 kg)    Physical Exam  Constitutional: He is oriented to person, place, and time. He appears well-developed and well-nourished. No distress.  HENT:  Head: Normocephalic and atraumatic.  Right Ear: Hearing and external ear normal.  Left Ear: Hearing and external ear normal.  Nose: Nose normal.  Mouth/Throat: Oropharynx is clear and moist. No oropharyngeal exudate.  Eyes: Pupils are equal, round, and reactive to light. Conjunctivae, EOM and lids are normal. Right eye exhibits no discharge. Left eye exhibits no discharge. No scleral icterus.  Neck: Normal range of motion. Neck supple. No JVD present. No tracheal deviation present. No thyromegaly present.  Cardiovascular: Normal rate, regular rhythm, normal heart sounds and intact distal pulses. Exam reveals no gallop and no friction rub.  No murmur heard. Pulmonary/Chest: Effort normal and breath sounds normal. No stridor. No respiratory distress. He has no wheezes. He has no rales. He exhibits no tenderness.  Abdominal: Soft. Bowel  sounds are normal. He exhibits no distension and no mass. There is no tenderness. There is no rebound and no guarding. No hernia.  Genitourinary:  Genitourinary Comments: Deferred- done at urology  Musculoskeletal: Normal range of motion. He exhibits no edema, tenderness or deformity.  Lymphadenopathy:    He has no cervical adenopathy.  Neurological: He is alert and oriented to person, place, and time. He displays normal reflexes. No cranial nerve deficit or sensory deficit. He exhibits normal muscle tone. Coordination normal.  Skin: Skin is warm, dry and intact. Capillary refill takes less than 2 seconds. Rash noted. He is not diaphoretic. No erythema. No pallor.     Avulsion of R pinky toe nail, no redness or swelling, nail beginning to grow back   Psychiatric: He has a normal mood and affect. His speech is normal and behavior is normal. Judgment and thought content normal. Cognition and memory are normal.  Nursing note and vitals reviewed.   Results for orders placed or performed in visit on 03/20/18  Uric acid  Result Value Ref Range   Uric Acid 6.5 3.7 - 8.6 mg/dL  Comprehensive metabolic panel  Result Value Ref Range   Glucose 99 65 - 99 mg/dL   BUN 27 (H) 6 - 24 mg/dL   Creatinine, Ser 1.24 0.76 - 1.27 mg/dL   GFR calc non Af Amer 68 >59 mL/min/1.73   GFR calc Af Amer 79 >59 mL/min/1.73   BUN/Creatinine Ratio 22 (H) 9 - 20   Sodium 141 134 - 144 mmol/L   Potassium 4.6 3.5 - 5.2 mmol/L   Chloride 103 96 - 106 mmol/L   CO2 23 20 - 29 mmol/L   Calcium 10.1 8.7 - 10.2 mg/dL   Total Protein 6.9 6.0 - 8.5 g/dL   Albumin 4.3 3.5 - 5.5 g/dL   Globulin, Total 2.6 1.5 - 4.5 g/dL   Albumin/Globulin Ratio 1.7 1.2 - 2.2   Bilirubin Total 0.3 0.0 - 1.2 mg/dL   Alkaline Phosphatase 77 39 - 117 IU/L   AST 20 0 - 40 IU/L  ALT 18 0 - 44 IU/L      Assessment & Plan:   Problem List Items Addressed This Visit      Digestive   GERD (gastroesophageal reflux disease)    Stable.  Continue current regimen. Continue to monitor. Call with any concerns.         Musculoskeletal and Integument   Eczema    Stable. Continue current regimen. Continue to monitor. Call with any concerns.       Acute idiopathic gout involving toe of left foot    Stable. Continue current regimen. Continue to monitor. Call with any concerns. Rechecking uric acid today. Refills given.       Relevant Medications   allopurinol (ZYLOPRIM) 100 MG tablet   Other Relevant Orders   CBC with Differential/Platelet   Comprehensive metabolic panel   TSH   Uric acid     Genitourinary   Microscopic hematuria    Stable. Continue current regimen. Continue to monitor. Call with any concerns. Continue to follow with urology.      Relevant Orders   CBC with Differential/Platelet   Comprehensive metabolic panel   TSH   UA/M w/rflx Culture, Routine     Other   Hyperlipidemia    Stable. Continue current regimen. Continue to monitor. Call with any concerns. Rechecking levels today. Refill of atorvastatin given.       Relevant Medications   atorvastatin (LIPITOR) 20 MG tablet   Other Relevant Orders   CBC with Differential/Platelet   Comprehensive metabolic panel   Lipid Panel w/o Chol/HDL Ratio   TSH    Other Visit Diagnoses    Routine general medical examination at a health care facility    -  Primary   Vaccines up to date. Screening labs checked today. Continue diet and exercise. Continue to monitor.    Avulsion of toenail, initial encounter       Healing well. No sign of infection.    Relevant Orders   CBC with Differential/Platelet   TSH   Herpes zoster without complication       Beginning to dry up. Will treat with valacyclovir. Call with any concerns or if not getting better.    Relevant Medications   valACYclovir (VALTREX) 1000 MG tablet   Other Relevant Orders   CBC with Differential/Platelet   TSH       LABORATORY TESTING:  Health maintenance labs ordered today as  discussed above.   IMMUNIZATIONS:   - Tdap: Tetanus vaccination status reviewed: last tetanus booster within 10 years. - Influenza: Up to date - Pneumovax: Not applicable  PATIENT COUNSELING:    Sexuality: Discussed sexually transmitted diseases, partner selection, use of condoms, avoidance of unintended pregnancy  and contraceptive alternatives.   Advised to avoid cigarette smoking.  I discussed with the patient that most people either abstain from alcohol or drink within safe limits (<=14/week and <=4 drinks/occasion for males, <=7/weeks and <= 3 drinks/occasion for females) and that the risk for alcohol disorders and other health effects rises proportionally with the number of drinks per week and how often a drinker exceeds daily limits.  Discussed cessation/primary prevention of drug use and availability of treatment for abuse.   Diet: Encouraged to adjust caloric intake to maintain  or achieve ideal body weight, to reduce intake of dietary saturated fat and total fat, to limit sodium intake by avoiding high sodium foods and not adding table salt, and to maintain adequate dietary potassium and calcium preferably from fresh fruits, vegetables, and  low-fat dairy products.    stressed the importance of regular exercise  Injury prevention: Discussed safety belts, safety helmets, smoke detector, smoking near bedding or upholstery.   Dental health: Discussed importance of regular tooth brushing, flossing, and dental visits.   Follow up plan: NEXT PREVENTATIVE PHYSICAL DUE IN 1 YEAR. Return in about 3 months (around 07/15/2018) for follow up (not fasting).

## 2018-04-16 ENCOUNTER — Encounter: Payer: Self-pay | Admitting: Family Medicine

## 2018-04-17 ENCOUNTER — Encounter: Payer: Self-pay | Admitting: Urology

## 2018-04-17 ENCOUNTER — Ambulatory Visit (INDEPENDENT_AMBULATORY_CARE_PROVIDER_SITE_OTHER): Payer: Medicaid Other | Admitting: Urology

## 2018-04-17 ENCOUNTER — Telehealth: Payer: Self-pay | Admitting: Family Medicine

## 2018-04-17 ENCOUNTER — Encounter: Payer: Self-pay | Admitting: Family Medicine

## 2018-04-17 VITALS — BP 155/73 | HR 88 | Resp 16 | Ht 64.0 in | Wt 185.0 lb

## 2018-04-17 DIAGNOSIS — R3129 Other microscopic hematuria: Secondary | ICD-10-CM | POA: Diagnosis not present

## 2018-04-17 LAB — URINALYSIS, COMPLETE
BILIRUBIN UA: NEGATIVE
GLUCOSE, UA: NEGATIVE
Ketones, UA: NEGATIVE
Leukocytes, UA: NEGATIVE
NITRITE UA: NEGATIVE
PROTEIN UA: NEGATIVE
Specific Gravity, UA: 1.015 (ref 1.005–1.030)
UUROB: 0.2 mg/dL (ref 0.2–1.0)
pH, UA: 5 (ref 5.0–7.5)

## 2018-04-17 LAB — MICROSCOPIC EXAMINATION
Bacteria, UA: NONE SEEN
Epithelial Cells (non renal): NONE SEEN /hpf (ref 0–10)
WBC UA: NONE SEEN /HPF (ref 0–5)

## 2018-04-17 NOTE — Telephone Encounter (Signed)
Please ignore the blood redraw, [facility] is looking for the samples of blood.   OK to give the lab results.

## 2018-04-17 NOTE — Progress Notes (Signed)
47 year old male who is seen today for follow-up 1 year status post and negative microscopic hematuria evaluation.  In the interval the patient is not had any significant urinary tract symptoms including gross hematuria, dysuria, or progressive irritative voiding symptoms.  He feels otherwise like he is doing quite well.  He has not been treated for any urinary tract infections has not had any flank pain or suprapubic pain.  Past Medical History:  Diagnosis Date  . Autism   . Constipation   . GERD (gastroesophageal reflux disease)   . Gout   . High cholesterol   . History of kidney stones   . Overweight   . Prostatitis   . Suspicious nevus   . UTI (lower urinary tract infection)    Past Surgical History:  Procedure Laterality Date  . CHOLECYSTECTOMY  2014  . CYSTOSCOPY W/ RETROGRADES Bilateral 06/29/2015   Procedure: CYSTOSCOPY WITH RETROGRADE PYELOGRAM;  Surgeon: Hollice Espy, MD;  Location: [facility];  Service: Urology;  Laterality: Bilateral;   Current Outpatient Medications on File Prior to Visit  Medication Sig Dispense Refill  . acetaminophen (TYLENOL) 500 MG tablet Take 500 mg by mouth every 6 (six) hours as needed.    Marland Kitchen allopurinol (ZYLOPRIM) 100 MG tablet Take 1 tablet (100 mg total) by mouth daily. 90 tablet 3  . atorvastatin (LIPITOR) 20 MG tablet Take 1 tablet (20 mg total) by mouth daily. 90 tablet 1  . ketoconazole (NIZORAL) 2 % shampoo Apply 1 application topically 2 (two) times a week. 120 mL 0  . meloxicam (MOBIC) 15 MG tablet Take 1 tablet (15 mg total) by mouth daily. 90 tablet 3  . montelukast (SINGULAIR) 10 MG tablet Take 1 tablet (10 mg total) by mouth at bedtime. 90 tablet 1  . mupirocin nasal ointment (BACTROBAN) 2 % Apply to spot on chest 2x a day 10 g 0  . triamcinolone ointment (KENALOG) 0.5 % Apply 1 application topically 2 (two) times daily. 45 g 1  . valACYclovir (VALTREX) 1000 MG tablet Take 1 tablet (1,000 mg total) by mouth 2 (two) times daily. 20  tablet 0   Current Facility-Administered Medications on File Prior to Visit  Medication Dose Route Frequency Provider Last Rate Last Dose  . betamethasone acetate-betamethasone sodium phosphate (CELESTONE) injection 12 mg  12 mg Intramuscular Once Daylene Katayama M, DPM      . betamethasone acetate-betamethasone sodium phosphate (CELESTONE) injection 3 mg  3 mg Intramuscular Once Daylene Katayama M, DPM      . betamethasone acetate-betamethasone sodium phosphate (CELESTONE) injection 3 mg  3 mg Intramuscular Once Edrick Kins, DPM       Vitals:   04/17/18 1154  BP: (!) 155/73  Pulse: 88  Resp: 16  SpO2: 98%  Weight: 83.9 kg (185 lb)  Height: 5\' 4"  (1.626 m)  NAD  Urine analysis today demonstrates 0-2 RBCs/hpf  Impression: Dipstick positive hematuria with no evidence of microscopic hematuria.  The patient has no significant symptoms today. Recommendation follow-up as needed.

## 2018-04-17 NOTE — Telephone Encounter (Signed)
Please make sure that he is taking his singulair as well as his claritin. He can also change to zyrtec or allegra rather than claritin

## 2018-04-17 NOTE — Telephone Encounter (Signed)
Please let him know that his thyroid and blood count came back normal but they lost his other tube of blood- if he comes back in they can redrawn him

## 2018-04-17 NOTE — Telephone Encounter (Signed)
Called patient, no answer, left a message for patient to return my call. OK for nurse triage to give results when he calls back.

## 2018-04-18 LAB — CBC WITH DIFFERENTIAL/PLATELET
BASOS ABS: 0 10*3/uL (ref 0.0–0.2)
Basos: 0 %
EOS (ABSOLUTE): 0.1 10*3/uL (ref 0.0–0.4)
Eos: 1 %
Hematocrit: 41.9 % (ref 37.5–51.0)
Hemoglobin: 13.8 g/dL (ref 13.0–17.7)
IMMATURE GRANULOCYTES: 0 %
Immature Grans (Abs): 0 10*3/uL (ref 0.0–0.1)
Lymphocytes Absolute: 1.6 10*3/uL (ref 0.7–3.1)
Lymphs: 19 %
MCH: 29.6 pg (ref 26.6–33.0)
MCHC: 32.9 g/dL (ref 31.5–35.7)
MCV: 90 fL (ref 79–97)
Monocytes Absolute: 0.9 10*3/uL (ref 0.1–0.9)
Monocytes: 10 %
NEUTROS ABS: 5.9 10*3/uL (ref 1.4–7.0)
NEUTROS PCT: 70 %
PLATELETS: 203 10*3/uL (ref 150–379)
RBC: 4.66 x10E6/uL (ref 4.14–5.80)
RDW: 14.7 % (ref 12.3–15.4)
WBC: 8.5 10*3/uL (ref 3.4–10.8)

## 2018-04-18 LAB — COMPREHENSIVE METABOLIC PANEL

## 2018-04-18 LAB — LIPID PANEL W/O CHOL/HDL RATIO

## 2018-04-18 LAB — URIC ACID

## 2018-04-18 LAB — TSH: TSH: 3.94 u[IU]/mL (ref 0.450–4.500)

## 2018-04-19 ENCOUNTER — Encounter: Payer: Self-pay | Admitting: Family Medicine

## 2018-04-20 ENCOUNTER — Encounter: Payer: Self-pay | Admitting: Family Medicine

## 2018-04-21 ENCOUNTER — Ambulatory Visit: Payer: Medicaid Other | Admitting: Family Medicine

## 2018-04-22 ENCOUNTER — Ambulatory Visit: Payer: Medicaid Other | Admitting: Family Medicine

## 2018-04-22 ENCOUNTER — Encounter: Payer: Self-pay | Admitting: Family Medicine

## 2018-04-22 VITALS — BP 131/72 | HR 86 | Temp 98.1°F | Wt 188.1 lb

## 2018-04-22 DIAGNOSIS — R05 Cough: Secondary | ICD-10-CM

## 2018-04-22 DIAGNOSIS — M10072 Idiopathic gout, left ankle and foot: Secondary | ICD-10-CM | POA: Diagnosis not present

## 2018-04-22 DIAGNOSIS — E782 Mixed hyperlipidemia: Secondary | ICD-10-CM | POA: Diagnosis not present

## 2018-04-22 DIAGNOSIS — J301 Allergic rhinitis due to pollen: Secondary | ICD-10-CM

## 2018-04-22 DIAGNOSIS — J309 Allergic rhinitis, unspecified: Secondary | ICD-10-CM | POA: Insufficient documentation

## 2018-04-22 DIAGNOSIS — R059 Cough, unspecified: Secondary | ICD-10-CM

## 2018-04-22 MED ORDER — ALBUTEROL SULFATE HFA 108 (90 BASE) MCG/ACT IN AERS: 2.0000 | INHALATION_SPRAY | Freq: Four times a day (QID) | RESPIRATORY_TRACT | 2 refills | Status: DC | PRN

## 2018-04-22 MED ORDER — CETIRIZINE HCL 10 MG PO TABS: 10.0000 mg | ORAL_TABLET | Freq: Every day | ORAL | 2 refills | Status: DC

## 2018-04-22 NOTE — Assessment & Plan Note (Signed)
Lab lost last blood draw- redrawn today. Await results.

## 2018-04-22 NOTE — Progress Notes (Signed)
BP 131/72 (BP Location: Left Arm, Patient Position: Sitting, Cuff Size: Normal)   Pulse 86   Temp 98.1 F (36.7 C)   Wt 188 lb 2 oz (85.3 kg)   SpO2 97%   BMI 32.29 kg/m    Subjective:    Patient ID: Jesus Guerrero, male    DOB: 01/26/1971, 47 y.o.   MRN: [id number]  HPI: Jesus Guerrero is a 47 y.o. male  Chief Complaint  Patient presents with  . URI    since Thursday   UPPER RESPIRATORY TRACT INFECTION Duration: 4-5 days Worst symptom: cough Fever: no, chills Cough: yes Shortness of breath: no Wheezing: no Chest pain: yes, with cough Chest tightness: yes Chest congestion: yes Nasal congestion: yes Runny nose: yes Post nasal drip: yes Sneezing: yes Sore throat: no Swollen glands: no Sinus pressure: yes Headache: yes Face pain: no Toothache: no Ear pain: no  Ear pressure: no  Eyes red/itching:yes Eye drainage/crusting: yes  Vomiting: no Rash: no Fatigue: no Sick contacts: no Strep contacts: no  Context: stable Recurrent sinusitis: no Relief with OTC cold/cough medications: no  Treatments attempted: claritin, singulair, dayquil and niquil   Relevant past medical, surgical, family and social history reviewed and updated as indicated. Interim medical history since our last visit reviewed. Allergies and medications reviewed and updated.  Review of Systems  Constitutional: Positive for fever. Negative for activity change, appetite change, chills, diaphoresis, fatigue and unexpected weight change.  HENT: Positive for congestion, postnasal drip, rhinorrhea, sinus pressure, sinus pain and sneezing. Negative for dental problem, drooling, ear discharge, ear pain, facial swelling, hearing loss, mouth sores, nosebleeds, sore throat, tinnitus, trouble swallowing and voice change.   Eyes: Positive for discharge, redness and itching. Negative for photophobia, pain and visual disturbance.  Respiratory: Positive for cough. Negative for apnea, choking, chest  tightness, shortness of breath, wheezing and stridor.   Cardiovascular: Negative.   Psychiatric/Behavioral: Negative.     Per HPI unless specifically indicated above     Objective:    BP 131/72 (BP Location: Left Arm, Patient Position: Sitting, Cuff Size: Normal)   Pulse 86   Temp 98.1 F (36.7 C)   Wt 188 lb 2 oz (85.3 kg)   SpO2 97%   BMI 32.29 kg/m   Wt Readings from Last 3 Encounters:  04/22/18 188 lb 2 oz (85.3 kg)  04/17/18 185 lb (83.9 kg)  04/15/18 186 lb 5 oz (84.5 kg)    Physical Exam  Constitutional: He is oriented to person, place, and time. He appears well-developed and well-nourished. No distress.  HENT:  Head: Normocephalic and atraumatic.  Right Ear: Hearing and external ear normal.  Left Ear: Hearing and external ear normal.  Nose: Nose normal.  Mouth/Throat: Oropharynx is clear and moist. No oropharyngeal exudate.  Eyes: Pupils are equal, round, and reactive to light. Conjunctivae, EOM and lids are normal. Right eye exhibits no discharge. Left eye exhibits no discharge. No scleral icterus.  Neck: Normal range of motion. Neck supple. No JVD present. No tracheal deviation present. No thyromegaly present.  Cardiovascular: Normal rate, regular rhythm, normal heart sounds and intact distal pulses. Exam reveals no gallop and no friction rub.  No murmur heard. Pulmonary/Chest: Effort normal. No stridor. No respiratory distress. He has wheezes. He has no rales. He exhibits no tenderness.  Musculoskeletal: Normal range of motion.  Lymphadenopathy:    He has no cervical adenopathy.  Neurological: He is alert and oriented to person, place, and time.  Skin: Skin  is warm, dry and intact. Capillary refill takes less than 2 seconds. No rash noted. He is not diaphoretic. No erythema. No pallor.  Psychiatric: He has a normal mood and affect. His speech is normal and behavior is normal. Judgment and thought content normal. Cognition and memory are normal.  Nursing note and  vitals reviewed.   Results for orders placed or performed in visit on 04/17/18  Microscopic Examination  Result Value Ref Range   WBC, UA None seen 0 - 5 /hpf   RBC, UA 0-2 0 - 2 /hpf   Epithelial Cells (non renal) None seen 0 - 10 /hpf   Bacteria, UA None seen None seen/Few  Urinalysis, Complete  Result Value Ref Range   Specific Gravity, UA 1.015 1.005 - 1.030   pH, UA 5.0 5.0 - 7.5   Color, UA Yellow Yellow   Appearance Ur Clear Clear   Leukocytes, UA Negative Negative   Protein, UA Negative Negative/Trace   Glucose, UA Negative Negative   Ketones, UA Negative Negative   RBC, UA 1+ (A) Negative   Bilirubin, UA Negative Negative   Urobilinogen, Ur 0.2 0.2 - 1.0 mg/dL   Nitrite, UA Negative Negative   Microscopic Examination See below:       Assessment & Plan:   Problem List Items Addressed This Visit      Respiratory   Allergic rhinitis - Primary    Not doing well. Continue singulair. Change from claritin to zyrtec. Add albuterol during allergy season. Call if not getting better or getting worse.         Musculoskeletal and Integument   Acute idiopathic gout involving toe of left foot    Lab lost last blood draw- redrawn today. Await results.       Relevant Orders   Comprehensive metabolic panel   Uric acid     Other   Hyperlipidemia    Lab lost last blood draw- redrawn today. Await results.       Relevant Orders   Comprehensive metabolic panel   Lipid Panel w/o Chol/HDL Ratio    Other Visit Diagnoses    Cough       Likely due to allergies. Will treat with albuterol.        Follow up plan: Return As scheduled.Marland Kitchen

## 2018-04-22 NOTE — Assessment & Plan Note (Signed)
Not doing well. Continue singulair. Change from claritin to zyrtec. Add albuterol during allergy season. Call if not getting better or getting worse.

## 2018-04-23 ENCOUNTER — Encounter: Payer: Self-pay | Admitting: Family Medicine

## 2018-04-23 LAB — LIPID PANEL W/O CHOL/HDL RATIO
CHOLESTEROL TOTAL: 149 mg/dL (ref 100–199)
HDL: 41 mg/dL (ref >39)
LDL Calculated: 63 mg/dL (ref 0–99)
TRIGLYCERIDES: 227 mg/dL — AB (ref 0–149)
VLDL Cholesterol Cal: 45 mg/dL — ABNORMAL HIGH (ref 5–40)

## 2018-04-23 LAB — COMPREHENSIVE METABOLIC PANEL
A/G RATIO: 1.5 (ref 1.2–2.2)
ALK PHOS: 79 IU/L (ref 39–117)
ALT: 24 IU/L (ref 0–44)
AST: 16 IU/L (ref 0–40)
Albumin: 4.1 g/dL (ref 3.5–5.5)
BUN/Creatinine Ratio: 23 — ABNORMAL HIGH (ref 9–20)
BUN: 26 mg/dL — ABNORMAL HIGH (ref 6–24)
Bilirubin Total: 0.3 mg/dL (ref 0.0–1.2)
CALCIUM: 9 mg/dL (ref 8.7–10.2)
CO2: 22 mmol/L (ref 20–29)
Chloride: 106 mmol/L (ref 96–106)
Creatinine, Ser: 1.11 mg/dL (ref 0.76–1.27)
GFR calc Af Amer: 90 mL/min/{1.73_m2} (ref >59)
GFR, EST NON AFRICAN AMERICAN: 78 mL/min/{1.73_m2} (ref >59)
GLOBULIN, TOTAL: 2.8 g/dL (ref 1.5–4.5)
Glucose: 99 mg/dL (ref 65–99)
Potassium: 4.5 mmol/L (ref 3.5–5.2)
SODIUM: 142 mmol/L (ref 134–144)
Total Protein: 6.9 g/dL (ref 6.0–8.5)

## 2018-04-23 LAB — URIC ACID: URIC ACID: 6.8 mg/dL (ref 3.7–8.6)

## 2018-04-24 ENCOUNTER — Encounter: Payer: Self-pay | Admitting: Family Medicine

## 2018-05-19 ENCOUNTER — Encounter: Payer: Self-pay | Admitting: Family Medicine

## 2018-05-21 ENCOUNTER — Ambulatory Visit: Payer: Medicaid Other | Admitting: Family Medicine

## 2018-05-24 ENCOUNTER — Encounter: Payer: Self-pay | Admitting: Family Medicine

## 2018-05-25 ENCOUNTER — Encounter: Payer: Self-pay | Admitting: Family Medicine

## 2018-05-26 ENCOUNTER — Encounter: Payer: Self-pay | Admitting: Family Medicine

## 2018-05-27 ENCOUNTER — Encounter: Payer: Self-pay | Admitting: Family Medicine

## 2018-05-27 ENCOUNTER — Ambulatory Visit: Payer: Medicaid Other | Admitting: Family Medicine

## 2018-05-27 NOTE — Telephone Encounter (Signed)
LVM for pt letting him know we could see him this afternoon at 1:45.

## 2018-05-28 ENCOUNTER — Ambulatory Visit (INDEPENDENT_AMBULATORY_CARE_PROVIDER_SITE_OTHER): Payer: Medicaid Other | Admitting: Family Medicine

## 2018-05-28 ENCOUNTER — Encounter: Payer: Self-pay | Admitting: Family Medicine

## 2018-05-28 VITALS — BP 126/75 | HR 88 | Temp 98.1°F | Wt 191.4 lb

## 2018-05-28 DIAGNOSIS — G44209 Tension-type headache, unspecified, not intractable: Secondary | ICD-10-CM | POA: Diagnosis not present

## 2018-05-28 DIAGNOSIS — L249 Irritant contact dermatitis, unspecified cause: Secondary | ICD-10-CM | POA: Diagnosis not present

## 2018-05-28 MED ORDER — KETOROLAC TROMETHAMINE 60 MG/2ML IM SOLN
60.0000 mg | Freq: Once | INTRAMUSCULAR | Status: AC
  Administered 2018-05-28: 60 mg via INTRAMUSCULAR

## 2018-05-28 MED ORDER — TRIAMCINOLONE ACETONIDE 0.5 % EX OINT: 1.0000 "application " | TOPICAL_OINTMENT | Freq: Two times a day (BID) | CUTANEOUS | 1 refills | Status: DC

## 2018-05-28 NOTE — Patient Instructions (Signed)
Heat Exhaustion Information °WHAT IS HEAT EXHAUSTION? °Heat exhaustion happens when your body gets overheated from hot weather or from exercise. Heat exhaustion can lead to heat stroke, a life-threatening condition that requires emergency care. °Heat exhaustion is more likely to develop when: °· You are exercising or being active. °· You are in hot or humid weather. °· You are in bright sunshine. °· You are not drinking enough water. ° °WHO IS AT RISK FOR THIS CONDITION? °This condition is more likely to develop in: °· People who exercise in hot or humid weather. °· People who exercise beyond their fitness level. °· People who wear clothing that does not allow sweat to evaporate. °· People who are dehydrated. °· People who drink a lot of alcoholic beverages or beverages that have caffeine. This can lead to dehydration. °· People who are age 65 or older. °· Children. °· People who have a medical condition such as heart disease, poor circulation, sickle cell disease, or high blood pressure. °· People who have a fever. °· People who are very overweight (obese). ° °WHAT ARE THE SYMPTOMS OF THIS CONDITION? °Symptoms of heat exhaustion include: °· Heavy sweating along with feeling weak, dizzy, light-headed, and nauseous. °· Rapid heartbeat. °· Headache. °· Urine that is darker than normal. °· Muscle cramps, such as in the leg or side (flank). °· Moist, cool, and clammy skin. °· Fatigue. °· Thirst. °· Confusion. °· Fainting. ° °WHAT SHOULD I DO IF I THINK I HAVE THIS CONDITION? °If you think that you have heat exhaustion, call your health care provider. Follow his or her instructions. You should also: °· Call a friend or a family member and ask him or her to stay with you. °· Move to a cooler location, such as: °? Into the shade. °? In front of a fan. °? An air-conditioned space. °· Lie down and rest. °· Slowly drink nonalcoholic, caffeine-free fluids. °· Take off tight clothing or extra clothing. °· Take a cool bath or  shower, if possible. If you do not have access to a bath or shower, dab or mist cool water on your skin. ° °WHY IS IT IMPORTANT TO TREAT THIS CONDITION? °It is important to take care of yourself and treat heat exhaustion as soon as possible. Untreated heat exhaustion can turn into heat stroke, which is a life-threatening condition that requires urgent medical treatment. °HOW CAN I PREVENT THIS CONDITION? °To prevent this condition: °· Drink enough fluid to keep your urine clear or pale yellow. This helps your body to sweat properly. °· Avoid outdoor activities on very hot or humid days. °· Do not exercise or do other physical activity when you are not feeling well. °· Take breaks often during physical activity. °· Wear light-colored, loose-fitting, and lightweight clothing when it is hot outside. °· Wear a hat and use sunscreen when exercising outdoors. °· Avoid being outside during the hottest times of the day. °· Check with your health care provider before you start any new activity, especially if you take medicine or have a medical condition. °· Start any new activity slowly and work up to your fitness level. ° °HOW CAN I HELP TO PROTECT ELDERLY RELATIVES AND NEIGHBORS FROM THIS CONDITION? °People who are age 65 or older are at greater risk for heat exhaustion. Their bodies have a harder time adjusting to heat. They are also more likely to have a medical condition or be on medicines that increase their risk for heat exhaustion. They may get heat exhaustion   indoors if the heat is high for several days. You can help to protect them during hot weather by: °· Checking on them two or more times each day. °· Making sure that they are drinking plenty of cool, nonalcoholic, and caffeine-free fluids. °· Making sure that they use their air conditioner. °· Taking them to a location where air conditioning is available. °· Talking with their health care provider about their medical needs, medicines, and fluid  requirements. ° °SEEK MEDICAL CARE IF: °· Your symptoms last longer than 30 minutes. ° °SEEK IMMEDIATE MEDICAL CARE IF: °· You have any symptoms of heat stroke. These include: °? Fever. °? Vomiting. °? Red skin. °? Inability to sweat, resulting in hot, dry skin. °? Excessive thirst. °? Rapid breathing. °? Headache. °? Confusion or disorientation. °? Fainting. °? Seizures. °These symptoms may represent a serious problem that is an emergency. Do not wait to see if the symptoms will go away. Get medical help right away. Call your local emergency services (911 in the [place] Do not drive yourself to the hospital. °This information is not intended to replace advice given to you by your health care provider. Make sure you discuss any questions you have with your health care provider. °Document Released: 09/25/2008 Document Revised: 07/06/2016 Document Reviewed: 04/08/2016 °Elsevier Interactive Patient Education © 2018 [employer] ° °

## 2018-05-28 NOTE — Progress Notes (Signed)
BP 126/75 (BP Location: Left Arm, Patient Position: Sitting, Cuff Size: Normal)   Pulse 88   Temp 98.1 F (36.7 C)   Wt 191 lb 6 oz (86.8 kg)   SpO2 97%   BMI 32.85 kg/m    Subjective:    Patient ID: Jesus Guerrero, male    DOB: 1971-10-25, 47 y.o.   MRN: [id number]  HPI: Jesus Guerrero is a 47 y.o. male  Chief Complaint  Patient presents with  . Rash   RASH- has been painful and he has been having a headache. He is concerned that his shingles is back Duration:  Couple of days  Location: forehead, both sides  Itching: yes Burning: yes Redness: yes Oozing: no Scaling: yes Blisters: no Painful: yes Fevers: no Change in detergents/soaps/personal care products: no Recent illness: no Recent travel:no History of same: yes Context: stable Alleviating factors: nothing Treatments attempted:tylenol Shortness of breath: no  Throat/tongue swelling: no Myalgias/arthralgias: no  HEADACHES Duration: 4-5 days Onset: sudden Severity: moderate Quality: dull aching Frequency: constant Location: forehead Headache duration: constant Radiation: no Headache status at time of visit: current headache Treatments attempted: Treatments attempted: APAP   Aura: no Nausea:  no Vomiting: no Photophobia:  no Phonophobia:  no Effect on social functioning:  no Numbers of missed days of school/work each month:  Confusion:  no Gait disturbance/ataxia:  no Behavioral changes:  no Fevers:  no   Relevant past medical, surgical, family and social history reviewed and updated as indicated. Interim medical history since our last visit reviewed. Allergies and medications reviewed and updated.  Review of Systems  Constitutional: Negative.   Respiratory: Negative.   Cardiovascular: Negative.   Skin: Positive for rash. Negative for color change, pallor and wound.  Neurological: Positive for headaches. Negative for dizziness, tremors, seizures, syncope, facial asymmetry, speech  difficulty, weakness, light-headedness and numbness.  Psychiatric/Behavioral: Negative.     Per HPI unless specifically indicated above     Objective:    BP 126/75 (BP Location: Left Arm, Patient Position: Sitting, Cuff Size: Normal)   Pulse 88   Temp 98.1 F (36.7 C)   Wt 191 lb 6 oz (86.8 kg)   SpO2 97%   BMI 32.85 kg/m   Wt Readings from Last 3 Encounters:  05/28/18 191 lb 6 oz (86.8 kg)  04/22/18 188 lb 2 oz (85.3 kg)  04/17/18 185 lb (83.9 kg)    Physical Exam  Constitutional: He is oriented to person, place, and time. He appears well-developed and well-nourished. No distress.  HENT:  Head: Normocephalic and atraumatic.  Right Ear: Hearing normal.  Left Ear: Hearing normal.  Nose: Nose normal.  Eyes: Conjunctivae and lids are normal. Right eye exhibits no discharge. Left eye exhibits no discharge. No scleral icterus.  Cardiovascular: Normal rate, regular rhythm, normal heart sounds and intact distal pulses. Exam reveals no gallop and no friction rub.  No murmur heard. Pulmonary/Chest: Effort normal and breath sounds normal. No stridor. No respiratory distress. He has no wheezes. He has no rales. He exhibits no tenderness.  Musculoskeletal: Normal range of motion.  Neurological: He is alert and oriented to person, place, and time.  Skin: Skin is warm, dry and intact. Capillary refill takes less than 2 seconds. Rash (erythematous excoriated rash on both sides of his forehead) noted. He is not diaphoretic. No erythema. No pallor.  Psychiatric: He has a normal mood and affect. His speech is normal and behavior is normal. Judgment and thought content normal. Cognition  and memory are normal.    Results for orders placed or performed in visit on 04/22/18  Comprehensive metabolic panel  Result Value Ref Range   Glucose 99 65 - 99 mg/dL   BUN 26 (H) 6 - 24 mg/dL   Creatinine, Ser 1.11 0.76 - 1.27 mg/dL   GFR calc non Af Amer 78 >59 mL/min/1.73   GFR calc Af Amer 90 >59  mL/min/1.73   BUN/Creatinine Ratio 23 (H) 9 - 20   Sodium 142 134 - 144 mmol/L   Potassium 4.5 3.5 - 5.2 mmol/L   Chloride 106 96 - 106 mmol/L   CO2 22 20 - 29 mmol/L   Calcium 9.0 8.7 - 10.2 mg/dL   Total Protein 6.9 6.0 - 8.5 g/dL   Albumin 4.1 3.5 - 5.5 g/dL   Globulin, Total 2.8 1.5 - 4.5 g/dL   Albumin/Globulin Ratio 1.5 1.2 - 2.2   Bilirubin Total 0.3 0.0 - 1.2 mg/dL   Alkaline Phosphatase 79 39 - 117 IU/L   AST 16 0 - 40 IU/L   ALT 24 0 - 44 IU/L  Uric acid  Result Value Ref Range   Uric Acid 6.8 3.7 - 8.6 mg/dL  Lipid Panel w/o Chol/HDL Ratio  Result Value Ref Range   Cholesterol, Total 149 100 - 199 mg/dL   Triglycerides 227 (H) 0 - 149 mg/dL   HDL 41 >39 mg/dL   VLDL Cholesterol Cal 45 (H) 5 - 40 mg/dL   LDL Calculated 63 0 - 99 mg/dL      Assessment & Plan:   Problem List Items Addressed This Visit    None    Visit Diagnoses    Tension headache    -  Primary   Likely due to the heat wave. Increase fluids. Toradol shot given today. Call with any concerns.    Relevant Medications   ketorolac (TORADOL) injection 60 mg (Completed)   Irritant contact dermatitis, unspecified trigger       To use his triamcinalone on the rash. Not shingles. Call with any concerns.        Follow up plan: Return if symptoms worsen or fail to improve.

## 2018-05-31 ENCOUNTER — Encounter: Payer: Self-pay | Admitting: Family Medicine

## 2018-06-03 ENCOUNTER — Ambulatory Visit: Payer: Medicaid Other | Admitting: Family Medicine

## 2018-06-10 ENCOUNTER — Encounter: Payer: Self-pay | Admitting: Family Medicine

## 2018-06-10 DIAGNOSIS — L309 Dermatitis, unspecified: Secondary | ICD-10-CM

## 2018-06-10 DIAGNOSIS — T148XXA Other injury of unspecified body region, initial encounter: Secondary | ICD-10-CM

## 2018-06-10 DIAGNOSIS — R4681 Obsessive-compulsive behavior: Secondary | ICD-10-CM

## 2018-06-10 DIAGNOSIS — L259 Unspecified contact dermatitis, unspecified cause: Secondary | ICD-10-CM

## 2018-06-12 ENCOUNTER — Encounter: Payer: Self-pay | Admitting: Family Medicine

## 2018-06-16 ENCOUNTER — Encounter: Payer: Self-pay | Admitting: Family Medicine

## 2018-06-18 ENCOUNTER — Encounter: Payer: Self-pay | Admitting: Family Medicine

## 2018-06-19 ENCOUNTER — Encounter: Payer: Self-pay | Admitting: Family Medicine

## 2018-06-20 ENCOUNTER — Encounter: Payer: Self-pay | Admitting: Family Medicine

## 2018-06-21 ENCOUNTER — Encounter: Payer: Self-pay | Admitting: Family Medicine

## 2018-06-22 ENCOUNTER — Encounter: Payer: Self-pay | Admitting: Family Medicine

## 2018-06-23 ENCOUNTER — Encounter: Payer: Self-pay | Admitting: Family Medicine

## 2018-06-23 ENCOUNTER — Ambulatory Visit: Payer: Medicaid Other | Admitting: Family Medicine

## 2018-06-23 VITALS — BP 130/76 | HR 83 | Wt 195.1 lb

## 2018-06-23 DIAGNOSIS — M545 Low back pain, unspecified: Secondary | ICD-10-CM

## 2018-06-23 LAB — MICROSCOPIC EXAMINATION: Bacteria, UA: NONE SEEN

## 2018-06-23 LAB — UA/M W/RFLX CULTURE, ROUTINE
Bilirubin, UA: NEGATIVE
Glucose, UA: NEGATIVE
Leukocytes, UA: NEGATIVE
NITRITE UA: NEGATIVE
PH UA: 5 (ref 5.0–7.5)
Protein, UA: NEGATIVE
Specific Gravity, UA: 1.02 (ref 1.005–1.030)
Urobilinogen, Ur: 0.2 mg/dL (ref 0.2–1.0)

## 2018-06-23 MED ORDER — CYCLOBENZAPRINE HCL 5 MG PO TABS: 5.0000 mg | ORAL_TABLET | Freq: Every evening | ORAL | 0 refills | Status: DC | PRN

## 2018-06-23 NOTE — Progress Notes (Signed)
BP 130/76 (BP Location: Right Arm, Patient Position: Sitting, Cuff Size: Large)   Pulse 83   Wt 195 lb 2 oz (88.5 kg)   SpO2 97%   BMI 33.49 kg/m    Subjective:    Patient ID: Jesus Guerrero, male    DOB: Nov 02, 1971, 47 y.o.   MRN: [id number]  HPI: Jesus Guerrero is a 47 y.o. male  Chief Complaint  Patient presents with  . Back Pain    middle lower   BACK PAIN Duration: 4-5 days Mechanism of injury: unknown Location: bilateral and low back Onset: sudden Severity: severe Quality: sharp Frequency: constant Radiation: none Aggravating factors: lifting and movement Alleviating factors: nothing Status: stable Treatments attempted: meloxicam, tylenol and rest  Relief with NSAIDs?: no Nighttime pain:  yes Paresthesias / decreased sensation:  no Bowel / bladder incontinence:  no Fevers:  no Dysuria / urinary frequency:  no  Relevant past medical, surgical, family and social history reviewed and updated as indicated. Interim medical history since our last visit reviewed. Allergies and medications reviewed and updated.  Review of Systems  Constitutional: Negative.   Respiratory: Negative.   Cardiovascular: Negative.   Gastrointestinal: Negative for abdominal distention.  Musculoskeletal: Positive for back pain and myalgias. Negative for arthralgias, gait problem, joint swelling, neck pain and neck stiffness.  Skin: Negative.   Neurological: Negative.   Psychiatric/Behavioral: Negative.     Per HPI unless specifically indicated above     Objective:    BP 130/76 (BP Location: Right Arm, Patient Position: Sitting, Cuff Size: Large)   Pulse 83   Wt 195 lb 2 oz (88.5 kg)   SpO2 97%   BMI 33.49 kg/m   Wt Readings from Last 3 Encounters:  06/23/18 195 lb 2 oz (88.5 kg)  05/28/18 191 lb 6 oz (86.8 kg)  04/22/18 188 lb 2 oz (85.3 kg)    Physical Exam  Constitutional: He is oriented to person, place, and time. He appears well-developed and well-nourished. No  distress.  HENT:  Head: Normocephalic and atraumatic.  Right Ear: Hearing normal.  Left Ear: Hearing normal.  Nose: Nose normal.  Eyes: Conjunctivae and lids are normal. Right eye exhibits no discharge. Left eye exhibits no discharge. No scleral icterus.  Cardiovascular: Normal rate, regular rhythm, normal heart sounds and intact distal pulses. Exam reveals no gallop and no friction rub.  No murmur heard. Pulmonary/Chest: Effort normal and breath sounds normal. No stridor. No respiratory distress. He has no wheezes. He has no rales. He exhibits no tenderness.  Musculoskeletal: Normal range of motion.  Neurological: He is alert and oriented to person, place, and time.  Skin: Skin is warm, dry and intact. Capillary refill takes less than 2 seconds. No rash noted. He is not diaphoretic. No erythema. No pallor.  Psychiatric: He has a normal mood and affect. His speech is normal and behavior is normal. Judgment and thought content normal. Cognition and memory are normal.  Nursing note and vitals reviewed. Back Exam:    Inspection:  Normal spinal curvature.  No deformity, ecchymosis, erythema, or lesions     Palpation:     Midline spinal tenderness: no      Paralumbar tenderness: no      Parathoracic tenderness: no      Buttocks tenderness: yes midline     Range of Motion:      Flexion: Normal     Extension:Normal     Lateral bending:Normal    Rotation:Normal    Neuro  Exam:Lower extremity DTRs normal & symmetric.  Strength and sensation intact.    Special Tests:      Straight leg raise:negative  Results for orders placed or performed in visit on 04/22/18  Comprehensive metabolic panel  Result Value Ref Range   Glucose 99 65 - 99 mg/dL   BUN 26 (H) 6 - 24 mg/dL   Creatinine, Ser 1.11 0.76 - 1.27 mg/dL   GFR calc non Af Amer 78 >59 mL/min/1.73   GFR calc Af Amer 90 >59 mL/min/1.73   BUN/Creatinine Ratio 23 (H) 9 - 20   Sodium 142 134 - 144 mmol/L   Potassium 4.5 3.5 - 5.2 mmol/L    Chloride 106 96 - 106 mmol/L   CO2 22 20 - 29 mmol/L   Calcium 9.0 8.7 - 10.2 mg/dL   Total Protein 6.9 6.0 - 8.5 g/dL   Albumin 4.1 3.5 - 5.5 g/dL   Globulin, Total 2.8 1.5 - 4.5 g/dL   Albumin/Globulin Ratio 1.5 1.2 - 2.2   Bilirubin Total 0.3 0.0 - 1.2 mg/dL   Alkaline Phosphatase 79 39 - 117 IU/L   AST 16 0 - 40 IU/L   ALT 24 0 - 44 IU/L  Uric acid  Result Value Ref Range   Uric Acid 6.8 3.7 - 8.6 mg/dL  Lipid Panel w/o Chol/HDL Ratio  Result Value Ref Range   Cholesterol, Total 149 100 - 199 mg/dL   Triglycerides 227 (H) 0 - 149 mg/dL   HDL 41 >39 mg/dL   VLDL Cholesterol Cal 45 (H) 5 - 40 mg/dL   LDL Calculated 63 0 - 99 mg/dL      Assessment & Plan:   Problem List Items Addressed This Visit    None    Visit Diagnoses    Acute bilateral low back pain without sciatica    -  Primary   Will treat with meloxicam and flexeril. Start PT- referral generated. Recheck 1 month. Call with any concerns.    Relevant Medications   cyclobenzaprine (FLEXERIL) 5 MG tablet   Other Relevant Orders   UA/M w/rflx Culture, Routine       Follow up plan: Return in about 1 month (around 07/21/2018) for Follow up on back pain.

## 2018-06-23 NOTE — Patient Instructions (Signed)

## 2018-06-24 ENCOUNTER — Encounter: Payer: Self-pay | Admitting: Family Medicine

## 2018-06-27 ENCOUNTER — Encounter: Payer: Self-pay | Admitting: Family Medicine

## 2018-06-27 DIAGNOSIS — M545 Low back pain, unspecified: Secondary | ICD-10-CM

## 2018-06-27 NOTE — Telephone Encounter (Signed)
Can you please place a new referral for [facility]

## 2018-06-27 NOTE — Telephone Encounter (Signed)
Left vm for [facility] to verify that they take Medicaid.   [facility] number is [phone]

## 2018-06-27 NOTE — Telephone Encounter (Signed)
[facility] does accept Medicaid. Will forward to referral coordinator to change location on referral and resubmitt to [facility] outpatient

## 2018-06-27 NOTE — Telephone Encounter (Signed)
Dr.Johnson is there somewhere else that he can be sent to that takes Medicaid.

## 2018-06-28 ENCOUNTER — Encounter: Payer: Self-pay | Admitting: Family Medicine

## 2018-07-08 ENCOUNTER — Encounter: Payer: Self-pay | Admitting: Family Medicine

## 2018-07-10 ENCOUNTER — Encounter: Payer: Self-pay | Admitting: Family Medicine

## 2018-07-11 ENCOUNTER — Encounter: Payer: Self-pay | Admitting: Family Medicine

## 2018-07-14 ENCOUNTER — Encounter: Payer: Self-pay | Admitting: Family Medicine

## 2018-07-15 ENCOUNTER — Ambulatory Visit: Payer: Medicaid Other | Admitting: Family Medicine

## 2018-07-16 ENCOUNTER — Ambulatory Visit: Payer: Medicaid Other | Attending: Family Medicine

## 2018-07-16 DIAGNOSIS — M545 Low back pain, unspecified: Secondary | ICD-10-CM

## 2018-07-16 DIAGNOSIS — M6281 Muscle weakness (generalized): Secondary | ICD-10-CM | POA: Diagnosis present

## 2018-07-16 DIAGNOSIS — D229 Melanocytic nevi, unspecified: Secondary | ICD-10-CM

## 2018-07-16 HISTORY — DX: Melanocytic nevi, unspecified: D22.9

## 2018-07-16 NOTE — Patient Instructions (Addendum)
     Scapular Retraction (Standing or sitting)   With arms at sides, pinch shoulder blades together. ("Stick your chest out")   Hold for 5 seconds.  Repeat __10__ times per set. Do __3__ sets per session.     Copyright  VHI. All rights reserved.      Sitting, roll a frozen water bottle on the bottom of each foot for 2 minutes about 3 times daily.     Gave standing R lateral shift correction as part of his HEP. Pt demonstrated and verbalized understanding. Video instruction and picture provided on patient's phone
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
## 2018-07-18 ENCOUNTER — Encounter: Payer: Self-pay | Admitting: Family Medicine

## 2018-07-18 ENCOUNTER — Other Ambulatory Visit: Payer: Self-pay | Admitting: Family Medicine

## 2018-07-18 ENCOUNTER — Ambulatory Visit (INDEPENDENT_AMBULATORY_CARE_PROVIDER_SITE_OTHER): Payer: Medicaid Other | Admitting: Family Medicine

## 2018-07-18 VITALS — BP 125/73 | HR 86 | Wt 191.6 lb

## 2018-07-18 DIAGNOSIS — E782 Mixed hyperlipidemia: Secondary | ICD-10-CM | POA: Diagnosis not present

## 2018-07-18 DIAGNOSIS — M545 Low back pain, unspecified: Secondary | ICD-10-CM

## 2018-07-18 DIAGNOSIS — M10072 Idiopathic gout, left ankle and foot: Secondary | ICD-10-CM

## 2018-07-18 NOTE — Progress Notes (Signed)
BP 125/73 (BP Location: Left Arm, Patient Position: Sitting, Cuff Size: Large)   Pulse 86   Wt 191 lb 9 oz (86.9 kg)   SpO2 97%   BMI 32.88 kg/m    Subjective:    Patient ID: Jesus Guerrero, male    DOB: [birth year], 47 y.o.   MRN: [id number]  HPI: Jesus Guerrero is a 47 y.o. male  Chief Complaint  Patient presents with  . Hyperlipidemia  . Gout   No problems with his gout. Tolerating his medicine well.   Back is feeling better. Has been doing PT.   HYPERLIPIDEMIA Hyperlipidemia status: Stable Satisfied with current treatment?  yes Side effects:  no Medication compliance: excellent compliance Past cholesterol meds: atorvastatin Supplements: none Aspirin:  no The 10-year ASCVD risk score [name], et al., 2013) is: 2.2%   Values used to calculate the score:     Age: 17 years     Sex: Male     Is Non-Hispanic African American: No     Diabetic: No     Tobacco smoker: No     Systolic Blood Pressure: 614 mmHg     Is BP treated: No     HDL Cholesterol: 41 mg/dL     Total Cholesterol: 149 mg/dL Chest pain:  no Coronary artery disease:  no  Relevant past medical, surgical, family and social history reviewed and updated as indicated. Interim medical history since our last visit reviewed. Allergies and medications reviewed and updated.  Review of Systems  Constitutional: Negative.   Respiratory: Negative.   Cardiovascular: Negative.   Psychiatric/Behavioral: Negative.     Per HPI unless specifically indicated above     Objective:    BP 125/73 (BP Location: Left Arm, Patient Position: Sitting, Cuff Size: Large)   Pulse 86   Wt 191 lb 9 oz (86.9 kg)   SpO2 97%   BMI 32.88 kg/m   Wt Readings from Last 3 Encounters:  07/18/18 191 lb 9 oz (86.9 kg)  06/23/18 195 lb 2 oz (88.5 kg)  05/28/18 191 lb 6 oz (86.8 kg)    Physical Exam  Constitutional: He is oriented to person, place, and time. He appears well-developed and well-nourished. No distress.    HENT:  Head: Normocephalic and atraumatic.  Right Ear: Hearing normal.  Left Ear: Hearing normal.  Nose: Nose normal.  Eyes: Conjunctivae and lids are normal. Right eye exhibits no discharge. Left eye exhibits no discharge. No scleral icterus.  Cardiovascular: Normal rate, regular rhythm, normal heart sounds and intact distal pulses. Exam reveals no gallop and no friction rub.  No murmur heard. Pulmonary/Chest: Effort normal and breath sounds normal. No stridor. No respiratory distress. He has no wheezes. He has no rales. He exhibits no tenderness.  Musculoskeletal: Normal range of motion.  Neurological: He is alert and oriented to person, place, and time.  Skin: Skin is warm, dry and intact. Capillary refill takes less than 2 seconds. No rash noted. He is not diaphoretic. No erythema. No pallor.  Psychiatric: He has a normal mood and affect. His speech is normal and behavior is normal. Judgment and thought content normal. Cognition and memory are normal.  Nursing note and vitals reviewed.   Results for orders placed or performed in visit on 06/23/18  Microscopic Examination  Result Value Ref Range   WBC, UA 0-5 0 - 5 /hpf   RBC, UA 0-2 0 - 2 /hpf   Epithelial Cells (non renal) CANCELED  Bacteria, UA None seen None seen/Few  UA/M w/rflx Culture, Routine  Result Value Ref Range   Specific Gravity, UA 1.020 1.005 - 1.030   pH, UA 5.0 5.0 - 7.5   Color, UA Yellow Yellow   Appearance Ur Clear Clear   Leukocytes, UA Negative Negative   Protein, UA Negative Negative/Trace   Glucose, UA Negative Negative   Ketones, UA Trace (A) Negative   RBC, UA Trace (A) Negative   Bilirubin, UA Negative Negative   Urobilinogen, Ur 0.2 0.2 - 1.0 mg/dL   Nitrite, UA Negative Negative   Microscopic Examination See below:       Assessment & Plan:   Problem List Items Addressed This Visit      Musculoskeletal and Integument   Acute idiopathic gout involving toe of left foot - Primary     Stable. Rechecking uric acid. Call with any concerns. Continue to monitor.       Relevant Orders   Comprehensive metabolic panel   Uric acid     Other   Hyperlipidemia    Stable. Rechecking labs today. Call with any concerns. Continue to monitor.       Relevant Orders   Comprehensive metabolic panel   Lipid Panel w/o Chol/HDL Ratio    Other Visit Diagnoses    Acute bilateral low back pain without sciatica       Doing better with PT. Continue PT. Call with any concerns.        Follow up plan: Return in about 3 months (around 10/18/2018) for Physical.

## 2018-07-18 NOTE — Assessment & Plan Note (Signed)
Stable. Rechecking uric acid. Call with any concerns. Continue to monitor.

## 2018-07-18 NOTE — Assessment & Plan Note (Signed)
Stable. Rechecking labs today. Call with any concerns. Continue to monitor.

## 2018-07-19 LAB — URIC ACID: Uric Acid: 7.4 mg/dL (ref 3.7–8.6)

## 2018-07-19 LAB — COMPREHENSIVE METABOLIC PANEL
ALK PHOS: 90 IU/L (ref 39–117)
ALT: 16 IU/L (ref 0–44)
AST: 17 IU/L (ref 0–40)
Albumin/Globulin Ratio: 1.5 (ref 1.2–2.2)
Albumin: 4.3 g/dL (ref 3.5–5.5)
BILIRUBIN TOTAL: 0.6 mg/dL (ref 0.0–1.2)
BUN/Creatinine Ratio: 22 — ABNORMAL HIGH (ref 9–20)
BUN: 27 mg/dL — AB (ref 6–24)
CHLORIDE: 105 mmol/L (ref 96–106)
CO2: 23 mmol/L (ref 20–29)
CREATININE: 1.22 mg/dL (ref 0.76–1.27)
Calcium: 9.8 mg/dL (ref 8.7–10.2)
GFR calc Af Amer: 81 mL/min/{1.73_m2} (ref >59)
GFR calc non Af Amer: 70 mL/min/{1.73_m2} (ref >59)
GLUCOSE: 92 mg/dL (ref 65–99)
Globulin, Total: 2.9 g/dL (ref 1.5–4.5)
Potassium: 5.1 mmol/L (ref 3.5–5.2)
Sodium: 143 mmol/L (ref 134–144)
Total Protein: 7.2 g/dL (ref 6.0–8.5)

## 2018-07-19 LAB — LIPID PANEL W/O CHOL/HDL RATIO
CHOLESTEROL TOTAL: 134 mg/dL (ref 100–199)
HDL: 34 mg/dL — ABNORMAL LOW (ref >39)
LDL CALC: 35 mg/dL (ref 0–99)
TRIGLYCERIDES: 324 mg/dL — AB (ref 0–149)
VLDL CHOLESTEROL CAL: 65 mg/dL — AB (ref 5–40)

## 2018-07-22 ENCOUNTER — Encounter: Payer: Self-pay | Admitting: Family Medicine

## 2018-07-23 ENCOUNTER — Ambulatory Visit: Payer: Medicaid Other

## 2018-07-24 ENCOUNTER — Ambulatory Visit: Payer: Medicaid Other

## 2018-07-24 DIAGNOSIS — M545 Low back pain, unspecified: Secondary | ICD-10-CM

## 2018-07-24 DIAGNOSIS — M6281 Muscle weakness (generalized): Secondary | ICD-10-CM

## 2018-07-24 NOTE — Patient Instructions (Addendum)
   [employer] Access Code: [email or web address]    Shoulder Extension with Resistance  10x3 with 5 seconds     Seated Transversus Abdominis Bracing  10x3 with 5 seconds
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
## 2018-07-29 ENCOUNTER — Encounter: Payer: Self-pay | Admitting: Family Medicine

## 2018-07-30 ENCOUNTER — Ambulatory Visit: Payer: Medicaid Other

## 2018-07-30 DIAGNOSIS — M545 Low back pain, unspecified: Secondary | ICD-10-CM

## 2018-07-30 DIAGNOSIS — M6281 Muscle weakness (generalized): Secondary | ICD-10-CM
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
## 2018-07-30 NOTE — Telephone Encounter (Signed)
LVM for pt to call back to schedule appt

## 2018-08-06 ENCOUNTER — Ambulatory Visit: Payer: Medicaid Other

## 2018-08-07 ENCOUNTER — Encounter: Payer: Self-pay | Admitting: Family Medicine

## 2018-08-08 NOTE — Telephone Encounter (Signed)
Copied from anther my chart message:  I'm very sick. I'm nauseous. Been throwing up for 30 minutes. I got sick after supper.    Called patient to see if he can come in to be seen at 11:15, waiting for a call back.

## 2018-08-08 NOTE — Telephone Encounter (Signed)
Called patient, no answer, will try again. Verbal from Dr.Johnson that she can call in some zofran, but if he is not better by Monday he will need to be seen. Will notify patient of this when he returns my call.

## 2018-08-10 ENCOUNTER — Encounter: Payer: Self-pay | Admitting: Family Medicine

## 2018-08-11 DIAGNOSIS — M545 Low back pain, unspecified: Secondary | ICD-10-CM

## 2018-08-11 DIAGNOSIS — M6281 Muscle weakness (generalized): Secondary | ICD-10-CM
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
## 2018-08-13 ENCOUNTER — Ambulatory Visit: Payer: Medicaid Other

## 2018-08-15 ENCOUNTER — Other Ambulatory Visit: Payer: Self-pay | Admitting: Family Medicine

## 2018-08-15 NOTE — Telephone Encounter (Signed)
Flexeril refill Last Refill:07/18/18 #30 Last OV: 07/18/18 PCP: Dr. Wynetta Emery

## 2018-08-20 ENCOUNTER — Ambulatory Visit: Payer: Medicaid Other

## 2018-08-25 ENCOUNTER — Ambulatory Visit: Payer: Medicaid Other | Attending: Family Medicine

## 2018-08-26 ENCOUNTER — Emergency Department: Payer: Medicaid Other

## 2018-08-26 ENCOUNTER — Observation Stay
Admission: EM | Admit: 2018-08-26 | Discharge: 2018-08-28 | Disposition: A | Discharge status: Home / Self Care | Payer: Medicaid Other | Attending: Surgery | Admitting: Surgery

## 2018-08-26 ENCOUNTER — Other Ambulatory Visit: Payer: Self-pay

## 2018-08-26 ENCOUNTER — Encounter: Payer: Self-pay | Admitting: Emergency Medicine

## 2018-08-26 DIAGNOSIS — F840 Autistic disorder: Secondary | ICD-10-CM | POA: Diagnosis not present

## 2018-08-26 DIAGNOSIS — Z882 Allergy status to sulfonamides status: Secondary | ICD-10-CM | POA: Insufficient documentation

## 2018-08-26 DIAGNOSIS — R197 Diarrhea, unspecified: Secondary | ICD-10-CM

## 2018-08-26 DIAGNOSIS — R112 Nausea with vomiting, unspecified: Secondary | ICD-10-CM | POA: Diagnosis present

## 2018-08-26 DIAGNOSIS — M109 Gout, unspecified: Secondary | ICD-10-CM | POA: Diagnosis not present

## 2018-08-26 DIAGNOSIS — Z885 Allergy status to narcotic agent status: Secondary | ICD-10-CM | POA: Insufficient documentation

## 2018-08-26 DIAGNOSIS — E860 Dehydration: Secondary | ICD-10-CM | POA: Insufficient documentation

## 2018-08-26 DIAGNOSIS — K219 Gastro-esophageal reflux disease without esophagitis: Secondary | ICD-10-CM | POA: Insufficient documentation

## 2018-08-26 DIAGNOSIS — K529 Noninfective gastroenteritis and colitis, unspecified: Secondary | ICD-10-CM | POA: Diagnosis not present

## 2018-08-26 DIAGNOSIS — G8929 Other chronic pain: Secondary | ICD-10-CM | POA: Diagnosis present

## 2018-08-26 DIAGNOSIS — Z9049 Acquired absence of other specified parts of digestive tract: Secondary | ICD-10-CM | POA: Insufficient documentation

## 2018-08-26 DIAGNOSIS — R109 Unspecified abdominal pain: Secondary | ICD-10-CM | POA: Diagnosis present

## 2018-08-26 DIAGNOSIS — R1032 Left lower quadrant pain: Secondary | ICD-10-CM

## 2018-08-26 DIAGNOSIS — E7800 Pure hypercholesterolemia, unspecified: Secondary | ICD-10-CM | POA: Diagnosis not present

## 2018-08-26 DIAGNOSIS — Z87442 Personal history of urinary calculi: Secondary | ICD-10-CM | POA: Diagnosis not present

## 2018-08-26 DIAGNOSIS — Z79899 Other long term (current) drug therapy: Secondary | ICD-10-CM | POA: Diagnosis not present

## 2018-08-26 DIAGNOSIS — R1031 Right lower quadrant pain: Secondary | ICD-10-CM

## 2018-08-26 LAB — URINALYSIS, COMPLETE (UACMP) WITH MICROSCOPIC
Bacteria, UA: NONE SEEN
Bilirubin Urine: NEGATIVE
GLUCOSE, UA: NEGATIVE mg/dL
HGB URINE DIPSTICK: NEGATIVE
KETONES UR: NEGATIVE mg/dL
Leukocytes, UA: NEGATIVE
NITRITE: NEGATIVE
PROTEIN: NEGATIVE mg/dL
Specific Gravity, Urine: 1.025 (ref 1.005–1.030)
Squamous Epithelial / LPF: NONE SEEN (ref 0–5)
pH: 5 (ref 5.0–8.0)

## 2018-08-26 LAB — CBC
HEMATOCRIT: 43.9 % (ref 40.0–52.0)
HEMOGLOBIN: 15.1 g/dL (ref 13.0–18.0)
MCH: 30.7 pg (ref 26.0–34.0)
MCHC: 34.4 g/dL (ref 32.0–36.0)
MCV: 89 fL (ref 80.0–100.0)
Platelets: 245 10*3/uL (ref 150–440)
RBC: 4.93 MIL/uL (ref 4.40–5.90)
RDW: 13.5 % (ref 11.5–14.5)
WBC: 19.5 10*3/uL — ABNORMAL HIGH (ref 3.8–10.6)

## 2018-08-26 LAB — COMPREHENSIVE METABOLIC PANEL
ALBUMIN: 4.3 g/dL (ref 3.5–5.0)
ALT: 26 U/L (ref 0–44)
AST: 30 U/L (ref 15–41)
Alkaline Phosphatase: 87 U/L (ref 38–126)
Anion gap: 9 (ref 5–15)
BUN: 29 mg/dL — ABNORMAL HIGH (ref 6–20)
CHLORIDE: 110 mmol/L (ref 98–111)
CO2: 21 mmol/L — ABNORMAL LOW (ref 22–32)
CREATININE: 1.24 mg/dL (ref 0.61–1.24)
Calcium: 9 mg/dL (ref 8.9–10.3)
GFR calc Af Amer: >60 mL/min (ref >60)
GLUCOSE: 163 mg/dL — AB (ref 70–99)
Potassium: 4.4 mmol/L (ref 3.5–5.1)
Sodium: 140 mmol/L (ref 135–145)
Total Bilirubin: 0.8 mg/dL (ref 0.3–1.2)
Total Protein: 8.1 g/dL (ref 6.5–8.1)

## 2018-08-26 LAB — LIPASE, BLOOD: LIPASE: 28 U/L (ref 11–51)

## 2018-08-26 MED ORDER — PROMETHAZINE HCL 25 MG/ML IJ SOLN
12.5000 mg | Freq: Once | INTRAMUSCULAR | Status: AC
  Administered 2018-08-26: 12.5 mg via INTRAVENOUS
  Filled 2018-08-26: qty 1

## 2018-08-26 MED ORDER — TRAMADOL HCL 50 MG PO TABS
50.0000 mg | ORAL_TABLET | Freq: Four times a day (QID) | ORAL | Status: DC | PRN
  Administered 2018-08-27 – 2018-08-28 (×3): 50 mg via ORAL
  Filled 2018-08-26 (×3): qty 1

## 2018-08-26 MED ORDER — SODIUM CHLORIDE 0.9 % IV BOLUS
1000.0000 mL | Freq: Once | INTRAVENOUS | Status: AC
  Administered 2018-08-26: 1000 mL via INTRAVENOUS

## 2018-08-26 MED ORDER — FAMOTIDINE IN NACL 20-0.9 MG/50ML-% IV SOLN
20.0000 mg | Freq: Two times a day (BID) | INTRAVENOUS | Status: DC
  Administered 2018-08-27 – 2018-08-28 (×4): 20 mg via INTRAVENOUS
  Filled 2018-08-26 (×2): qty 50

## 2018-08-26 MED ORDER — ONDANSETRON 4 MG PO TBDP
4.0000 mg | ORAL_TABLET | Freq: Four times a day (QID) | ORAL | Status: DC | PRN
  Filled 2018-08-26: qty 1

## 2018-08-26 MED ORDER — LACTATED RINGERS IV SOLN
INTRAVENOUS | Status: DC
  Administered 2018-08-27: 01:00:00 via INTRAVENOUS

## 2018-08-26 MED ORDER — IOPAMIDOL (ISOVUE-300) INJECTION 61%
100.0000 mL | Freq: Once | INTRAVENOUS | Status: AC | PRN
  Administered 2018-08-26: 100 mL via INTRAVENOUS
  Filled 2018-08-26: qty 100

## 2018-08-26 MED ORDER — ONDANSETRON 4 MG PO TBDP: 4.0000 mg | ORAL_TABLET | Freq: Once | ORAL | Status: DC | PRN

## 2018-08-26 MED ORDER — ONDANSETRON HCL 4 MG/2ML IJ SOLN
4.0000 mg | Freq: Once | INTRAMUSCULAR | Status: AC
  Administered 2018-08-26: 4 mg via INTRAVENOUS
  Filled 2018-08-26: qty 2

## 2018-08-26 MED ORDER — ONDANSETRON HCL 4 MG/2ML IJ SOLN
4.0000 mg | Freq: Four times a day (QID) | INTRAMUSCULAR | Status: DC | PRN
  Administered 2018-08-26 – 2018-08-28 (×2): 4 mg via INTRAVENOUS
  Filled 2018-08-26 (×2): qty 2

## 2018-08-26 MED ORDER — DOCUSATE SODIUM 100 MG PO CAPS: 100.0000 mg | ORAL_CAPSULE | Freq: Two times a day (BID) | ORAL | Status: DC | PRN

## 2018-08-26 MED ORDER — ENOXAPARIN SODIUM 40 MG/0.4ML ~~LOC~~ SOLN
40.0000 mg | SUBCUTANEOUS | Status: DC
  Administered 2018-08-27: 40 mg via SUBCUTANEOUS
  Filled 2018-08-26: qty 0.4

## 2018-08-26 NOTE — ED Notes (Signed)
Surgeon at bedside with patient. 

## 2018-08-26 NOTE — ED Notes (Signed)
Pt updated to the best of this RNs ability. Pt resting in bed.

## 2018-08-26 NOTE — ED Triage Notes (Signed)
Pt in via POV with complaints of sudden onset N/V/D x one day, reports lower abdominal pain.  NAD noted at this time.
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
## 2018-08-26 NOTE — ED Notes (Signed)
Spoke with [facility] about bed assignment after bed placement said they were waiting on a room to be cleaned. AC said that environmental services is cleaning room now.

## 2018-08-26 NOTE — ED Notes (Signed)
Pt reporting nausea has returned.

## 2018-08-26 NOTE — ED Notes (Signed)
Pt up to bedside toilet and heard vomiting from the nursing station. Verbal order from Dr. Stark Klein to administer 12.5mg  of phenergan.

## 2018-08-27 ENCOUNTER — Telehealth: Payer: Self-pay

## 2018-08-27 ENCOUNTER — Ambulatory Visit: Payer: Medicaid Other

## 2018-08-27 LAB — BASIC METABOLIC PANEL
ANION GAP: 6 (ref 5–15)
BUN: 23 mg/dL — ABNORMAL HIGH (ref 6–20)
CALCIUM: 8.1 mg/dL — AB (ref 8.9–10.3)
CO2: 23 mmol/L (ref 22–32)
CREATININE: 0.9 mg/dL (ref 0.61–1.24)
Chloride: 112 mmol/L — ABNORMAL HIGH (ref 98–111)
GLUCOSE: 115 mg/dL — AB (ref 70–99)
Potassium: 3.9 mmol/L (ref 3.5–5.1)
Sodium: 141 mmol/L (ref 135–145)

## 2018-08-27 LAB — CBC
HCT: 38.3 % — ABNORMAL LOW (ref 40.0–52.0)
HCT: 38.3 % — ABNORMAL LOW (ref 40.0–52.0)
HEMOGLOBIN: 13.1 g/dL (ref 13.0–18.0)
Hemoglobin: 13.5 g/dL (ref 13.0–18.0)
MCH: 31.5 pg (ref 26.0–34.0)
MCH: 30.8 pg (ref 26.0–34.0)
MCHC: 35.2 g/dL (ref 32.0–36.0)
MCHC: 34.1 g/dL (ref 32.0–36.0)
MCV: 89.4 fL (ref 80.0–100.0)
MCV: 90.3 fL (ref 80.0–100.0)
PLATELETS: 231 10*3/uL (ref 150–440)
PLATELETS: 213 10*3/uL (ref 150–440)
RBC: 4.28 MIL/uL — ABNORMAL LOW (ref 4.40–5.90)
RBC: 4.24 MIL/uL — ABNORMAL LOW (ref 4.40–5.90)
RDW: 13.7 % (ref 11.5–14.5)
RDW: 13.6 % (ref 11.5–14.5)
WBC: 10.4 10*3/uL (ref 3.8–10.6)
WBC: 8.2 10*3/uL (ref 3.8–10.6)

## 2018-08-27 LAB — MAGNESIUM: Magnesium: 1.8 mg/dL (ref 1.7–2.4)

## 2018-08-27 LAB — PHOSPHORUS: Phosphorus: 2.7 mg/dL (ref 2.5–4.6)

## 2018-08-27 NOTE — Telephone Encounter (Signed)
No show. Called patient who said that he is currently admitted at the hospital. Pt to call back if he wants to continue PT for his back. Pt was informed that he will need a clearance from his doctor due to being admitted at the hospital in order to continue PT. Pt verbalized understanding.

## 2018-08-27 NOTE — Progress Notes (Signed)
Subjective:  CC:  Jesus Guerrero is a 47 y.o. male  Hospital stay day 0,   abd pain, diarrhea  HPI: No acute issues overnight.  States pain is slighty better, but diarrhea worse.  Endorsed one episode of black color stool mixed with his diarrhea.  Denies any recent iron supplementation, or black licorice intake.  First episode ever of melena.  ROS:  A 5 point review of systems was performed and pertinent positives and negatives noted in HPI.   Objective:      Temp:  [97.8 F (36.6 C)-98.7 F (37.1 C)] 98.6 F (37 C) (08/28 1154) Pulse Rate:  [85-108] 88 (08/28 1154) Resp:  [14-20] 14 (08/28 1154) BP: (112-144)/(52-76) 125/76 (08/28 1154) SpO2:  [95 %-98 %] 95 % (08/28 1154) Weight:  [86.6 kg] 86.6 kg (08/27 1612)     Height: 5\' 4"  (162.6 cm) Weight: 86.6 kg BMI (Calculated): 32.77   Intake/Output this shift:  No intake/output data recorded.       Constitutional :  alert, cooperative, appears stated age and no distress  Respiratory:  clear to auscultation bilaterally  Cardiovascular:  regular rate and rhythm  Gastrointestinal: soft, no guarding, but complains of mild tenderness still in RLQ..   Skin: Cool and moist.   Psychiatric: Normal affect, non-agitated, not confused       LABS:  CMP Latest Ref Rng & Units 08/27/2018 08/26/2018 07/18/2018  Glucose 70 - 99 mg/dL 115(H) 163(H) 92  BUN 6 - 20 mg/dL 23(H) 29(H) 27(H)  Creatinine 0.61 - 1.24 mg/dL 0.90 1.24 1.22  Sodium 135 - 145 mmol/L 141 140 143  Potassium 3.5 - 5.1 mmol/L 3.9 4.4 5.1  Chloride 98 - 111 mmol/L 112(H) 110 105  CO2 22 - 32 mmol/L 23 21(L) 23  Calcium 8.9 - 10.3 mg/dL 8.1(L) 9.0 9.8  Total Protein 6.5 - 8.1 g/dL - 8.1 7.2  Total Bilirubin 0.3 - 1.2 mg/dL - 0.8 0.6  Alkaline Phos 38 - 126 U/L - 87 90  AST 15 - 41 U/L - 30 17  ALT 0 - 44 U/L - 26 16   CBC Latest Ref Rng & Units 08/27/2018 08/27/2018 08/26/2018  WBC 3.8 - 10.6 K/uL 8.2 10.4 19.5(H)  Hemoglobin 13.0 - 18.0 g/dL 13.1 13.5 15.1   Hematocrit 40.0 - 52.0 % 38.3(L) 38.3(L) 43.9  Platelets 150 - 440 K/uL 213 231 245    RADS: n/a Assessment:   abd pain/diarrhea.  Likely gastroenteritis at this point since wbc normalized without any intervention and RLQ pain is not getting any worse.  Unlikely to be appendicitis at this point.  New onset black stool(melena)- No evidence of acute GI bleed, given no further episodes of melena reported and hgb is stable at 13.5.  Initial hgb level higher likey due to dehydration.  Will ADAT as if pain remains table and tolerating diet, will d/c later today or tomorrow am.

## 2018-08-28 ENCOUNTER — Encounter: Payer: Self-pay | Admitting: Family Medicine

## 2018-08-28 ENCOUNTER — Observation Stay: Payer: Medicaid Other

## 2018-08-28 LAB — GASTROINTESTINAL PANEL BY PCR, STOOL (REPLACES STOOL CULTURE)

## 2018-08-28 LAB — HIV ANTIBODY (ROUTINE TESTING W REFLEX): HIV Screen 4th Generation wRfx: NONREACTIVE

## 2018-08-28 LAB — CBC WITH DIFFERENTIAL/PLATELET
BASOS PCT: 0 %
Basophils Absolute: 0 10*3/uL (ref 0–0.1)
EOS ABS: 0.2 10*3/uL (ref 0–0.7)
Eosinophils Relative: 2 %
HCT: 40.3 % (ref 40.0–52.0)
HEMOGLOBIN: 14.1 g/dL (ref 13.0–18.0)
LYMPHS ABS: 1.5 10*3/uL (ref 1.0–3.6)
LYMPHS PCT: 18 %
MCH: 30.9 pg (ref 26.0–34.0)
MCHC: 35 g/dL (ref 32.0–36.0)
MCV: 88.4 fL (ref 80.0–100.0)
MONO ABS: 0.5 10*3/uL (ref 0.2–1.0)
MONOS PCT: 6 %
NEUTROS ABS: 6.1 10*3/uL (ref 1.4–6.5)
Neutrophils Relative %: 74 %
Platelets: 235 10*3/uL (ref 150–440)
RBC: 4.56 MIL/uL (ref 4.40–5.90)
RDW: 13.6 % (ref 11.5–14.5)
WBC: 8.2 10*3/uL (ref 3.8–10.6)

## 2018-08-28 LAB — MAGNESIUM: Magnesium: 1.9 mg/dL (ref 1.7–2.4)

## 2018-08-28 LAB — BASIC METABOLIC PANEL
Anion gap: 9 (ref 5–15)
BUN: 13 mg/dL (ref 6–20)
CALCIUM: 8.6 mg/dL — AB (ref 8.9–10.3)
CHLORIDE: 104 mmol/L (ref 98–111)
CO2: 27 mmol/L (ref 22–32)
CREATININE: 1 mg/dL (ref 0.61–1.24)
GFR calc Af Amer: >60 mL/min (ref >60)
GFR calc non Af Amer: >60 mL/min (ref >60)
Glucose, Bld: 132 mg/dL — ABNORMAL HIGH (ref 70–99)
Potassium: 4.1 mmol/L (ref 3.5–5.1)
SODIUM: 140 mmol/L (ref 135–145)

## 2018-08-28 LAB — C DIFFICILE QUICK SCREEN W PCR REFLEX
C Diff antigen: NEGATIVE
C Diff interpretation: NOT DETECTED
C Diff toxin: NEGATIVE

## 2018-08-28 LAB — PHOSPHORUS: Phosphorus: 1.7 mg/dL — ABNORMAL LOW (ref 2.5–4.6)

## 2018-08-28 NOTE — Progress Notes (Signed)
Jesus Guerrero to be D/C'd back to group home per MD order.  Discussed prescriptions and follow up appointments with the patient. Prescriptions given to patient, medication list explained in detail. Pt verbalized understanding.  Allergies as of 08/28/2018      Reactions   Bactrim [sulfamethoxazole-trimethoprim] Rash   Codeine Swelling      Medication List    TAKE these medications   acetaminophen 500 MG tablet Commonly known as:  TYLENOL Take 500 mg by mouth every 6 (six) hours as needed.   allopurinol 100 MG tablet Commonly known as:  ZYLOPRIM Take 1 tablet (100 mg total) by mouth daily.   atorvastatin 20 MG tablet Commonly known as:  LIPITOR Take 1 tablet (20 mg total) by mouth daily.   cetirizine 10 MG tablet Commonly known as:  ZYRTEC Take 1 tablet (10 mg total) by mouth daily.   clindamycin-benzoyl peroxide gel Commonly known as:  BENZACLIN APPLY THIN LAYER TO FACE IN THE MORNING. [place] OFF AT BEDTIME.   cyclobenzaprine 5 MG tablet Commonly known as:  FLEXERIL TAKE 1 TABLET BY MOUTH AT BEDTIME AS NEEDED FOR MUSCLE SPASM   doxycycline 20 MG tablet Commonly known as:  PERIOSTAT Take 20 mg by mouth 2 (two) times daily with a meal.   ketoconazole 2 % shampoo Commonly known as:  NIZORAL Apply 1 application topically 2 (two) times a week.   meloxicam 15 MG tablet Commonly known as:  MOBIC Take 1 tablet (15 mg total) by mouth daily.   montelukast 10 MG tablet Commonly known as:  SINGULAIR Take 1 tablet (10 mg total) by mouth at bedtime.       Vitals:   08/28/18 0539 08/28/18 1400  BP: 117/76 134/74  Pulse: 87 85  Resp: 20 14  Temp: 98 F (36.7 C) 98.1 F (36.7 C)  SpO2: 96% 95%    Skin clean, dry and intact without evidence of skin break down, no evidence of skin tears noted. IV catheter discontinued intact. Site without signs and symptoms of complications. Dressing and pressure applied. Pt denies pain at this time. No complaints noted.  An After  Visit Summary was printed and given to the patient. Patient escorted via [place], and D/C home via private auto.  Jesus Guerrero A Jesus Guerrero

## 2018-08-28 NOTE — Discharge Summary (Signed)
Physician Discharge Summary  Patient ID: Jesus Guerrero MRN: [id number] DOB/AGE: 07-27-1971 47 y.o.  Admit date: 08/26/2018 Discharge date: 08/28/2018  Admission Diagnoses: abdominal pain, diarrhea  Discharge Diagnoses: gastroenteritis  Discharged Condition: good  Hospital Course: Patient presented to the emergency room with complaints of right lower quadrant pain and loose stools due to the location of the pain there is concerns for possible very early appendicitis therefore patient was admitted for further observation.  During his stay his white count remained within normal limits and the pain improved without any obvious intervention.  By the time of discharge patient was tolerating a full liquid diet, labs and vitals remained stable and the pain has fully resolved.  Patient is comfortable being discharged from the hospital on a full liquid diet for the next several days as his diarrhea continues to improve.  We will also follow-up on his stool work-up to ensure he does not have any infection that may be treated as an outpatient basis.  Consults: None  Discharge Exam: Blood pressure 134/74, pulse 85, temperature 98.1 F (36.7 C), temperature source Oral, resp. rate 14, height 5\' 4"  (1.626 m), weight 86.6 kg, SpO2 95 %. General appearance: alert, cooperative and no distress Resp: clear to auscultation bilaterally Cardio: regular rate and rhythm GI: soft, non-tender; bowel sounds normal; no masses,  no organomegaly  Disposition:  Discharge disposition: 01-Home or Self Care        Allergies as of 08/28/2018      Reactions   Bactrim [sulfamethoxazole-trimethoprim] Rash   Codeine Swelling      Medication List    TAKE these medications   acetaminophen 500 MG tablet Commonly known as:  TYLENOL Take 500 mg by mouth every 6 (six) hours as needed.   allopurinol 100 MG tablet Commonly known as:  ZYLOPRIM Take 1 tablet (100 mg total) by mouth daily.   atorvastatin 20 MG  tablet Commonly known as:  LIPITOR Take 1 tablet (20 mg total) by mouth daily.   cetirizine 10 MG tablet Commonly known as:  ZYRTEC Take 1 tablet (10 mg total) by mouth daily.   clindamycin-benzoyl peroxide gel Commonly known as:  BENZACLIN APPLY THIN LAYER TO FACE IN THE MORNING. [place] OFF AT BEDTIME.   cyclobenzaprine 5 MG tablet Commonly known as:  FLEXERIL TAKE 1 TABLET BY MOUTH AT BEDTIME AS NEEDED FOR MUSCLE SPASM   doxycycline 20 MG tablet Commonly known as:  PERIOSTAT Take 20 mg by mouth 2 (two) times daily with a meal.   ketoconazole 2 % shampoo Commonly known as:  NIZORAL Apply 1 application topically 2 (two) times a week.   meloxicam 15 MG tablet Commonly known as:  MOBIC Take 1 tablet (15 mg total) by mouth daily.   montelukast 10 MG tablet Commonly known as:  SINGULAIR Take 1 tablet (10 mg total) by mouth at bedtime.      Follow-up Information    Park Liter P, DO Follow up.   Specialty:  Family Medicine Contact information: [place] [address] [phone]            Total time spent arranging discharge was >61min. Signed: Benjamine Sprague 08/28/2018, 2:57 PM

## 2018-08-28 NOTE — Discharge Instructions (Signed)

## 2018-08-29 ENCOUNTER — Telehealth: Payer: Self-pay

## 2018-08-29 NOTE — Telephone Encounter (Signed)
Transition Care Management Follow-up Telephone Call  Date of discharge and from where:  08/28/2018 at [facility]  How have you been since you were released from the hospital? "ok"  Any questions or concerns? Yes  questions sent from [place] about CT scan - Dr.Johnson will review at hospital follow up appt  Items Reviewed:  Did the pt receive and understand the discharge instructions provided? Yes   Medications obtained and verified? Yes   Any new allergies since your discharge? No   Dietary orders reviewed? Yes  Do you have support at home? Yes   Functional Questionnaire: (I = Independent and D = Dependent) ADLs: I  Bathing/Dressing- I  Meal Prep- I  Eating- I  Maintaining continence- I  Transferring/Ambulation- I  Managing Meds- I  Follow up appointments reviewed:   PCP Hospital f/u appt confirmed? Yes  Scheduled to see Dr.Johnson on 09/02/2018 @ 11:00am.  [facility] f/u appt confirmed? Yes  none  Are transportation arrangements needed? Yes  gets transportation from [employer], called and left him a message to confirm appt date and time.   If their condition worsens, is the pt aware to call PCP or go to the Emergency Dept.? Yes  Was the patient provided with contact information for the PCP's office or ED? Yes  Was to pt encouraged to call back with questions or concerns? Yes

## 2018-08-30 ENCOUNTER — Encounter: Payer: Self-pay | Admitting: Family Medicine

## 2018-09-02 ENCOUNTER — Ambulatory Visit: Payer: Medicaid Other | Admitting: Family Medicine

## 2018-09-02 ENCOUNTER — Encounter: Payer: Self-pay | Admitting: Family Medicine

## 2018-09-02 VITALS — BP 119/66 | HR 90 | Temp 98.7°F | Ht 64.0 in | Wt 193.8 lb

## 2018-09-02 DIAGNOSIS — N3000 Acute cystitis without hematuria: Secondary | ICD-10-CM

## 2018-09-02 DIAGNOSIS — K529 Noninfective gastroenteritis and colitis, unspecified: Secondary | ICD-10-CM | POA: Diagnosis not present

## 2018-09-02 DIAGNOSIS — Z23 Encounter for immunization: Secondary | ICD-10-CM | POA: Diagnosis not present

## 2018-09-02 MED ORDER — CIPROFLOXACIN HCL 500 MG PO TABS: 500.0000 mg | ORAL_TABLET | Freq: Two times a day (BID) | ORAL | 0 refills | Status: DC

## 2018-09-02 NOTE — Patient Instructions (Signed)
Food Choices to Help Relieve Diarrhea, Adult When you have diarrhea, the foods you eat and your eating habits are very important. Choosing the right foods and drinks can help:  Relieve diarrhea.  Replace lost fluids and nutrients.  Prevent dehydration.  What general guidelines should I follow? Relieving diarrhea  Choose foods with less than 2 g or .07 oz. of fiber per serving.  Limit fats to less than 8 tsp (38 g or 1.34 oz.) a day.  Avoid the following: ? Foods and beverages sweetened with high-fructose corn syrup, honey, or sugar alcohols such as xylitol, sorbitol, and mannitol. ? Foods that contain a lot of fat or sugar. ? Fried, greasy, or spicy foods. ? High-fiber grains, breads, and cereals. ? Raw fruits and vegetables.  Eat foods that are rich in probiotics. These foods include dairy products such as yogurt and fermented milk products. They help increase healthy bacteria in the stomach and intestines (gastrointestinal tract, or GI tract).  If you have lactose intolerance, avoid dairy products. These may make your diarrhea worse.  Take medicine to help stop diarrhea (antidiarrheal medicine) only as told by your health care provider. Replacing nutrients  Eat small meals or snacks every 3-4 hours.  Eat bland foods, such as white rice, toast, or baked potato, until your diarrhea starts to get better. Gradually reintroduce nutrient-rich foods as tolerated or as told by your health care provider. This includes: ? Well-cooked protein foods. ? Peeled, seeded, and soft-cooked fruits and vegetables. ? Low-fat dairy products.  Take vitamin and mineral supplements as told by your health care provider. Preventing dehydration   Start by sipping water or a special solution to prevent dehydration (oral rehydration solution, ORS). Urine that is clear or pale yellow means that you are getting enough fluid.  Try to drink at least 8-10 cups of fluid each day to help replace lost  fluids.  You may add other liquids in addition to water, such as clear juice or decaffeinated sports drinks, as tolerated or as told by your health care provider.  Avoid drinks with caffeine, such as coffee, tea, or soft drinks.  Avoid alcohol. What foods are recommended? The items listed may not be a complete list. Talk with your health care provider about what dietary choices are best for you. Grains White rice. White, French, or pita breads (fresh or toasted), including plain rolls, buns, or bagels. White pasta. Saltine, soda, or graham crackers. Pretzels. Low-fiber cereal. Cooked cereals made with water (such as cornmeal, farina, or cream cereals). Plain muffins. Matzo. Melba toast. Zwieback. Vegetables Potatoes (without the skin). Most well-cooked and canned vegetables without skins or seeds. Tender lettuce. Fruits Apple sauce. Fruits canned in juice. Cooked apricots, cherries, grapefruit, peaches, pears, or plums. Fresh bananas and cantaloupe. Meats and other protein foods Baked or boiled chicken. Eggs. Tofu. Fish. Seafood. Smooth nut butters. Ground or well-cooked tender beef, ham, veal, lamb, pork, or poultry. Dairy Plain yogurt, kefir, and unsweetened liquid yogurt. Lactose-free milk, buttermilk, skim milk, or soy milk. Low-fat or nonfat hard cheese. Beverages Water. Low-calorie sports drinks. Fruit juices without pulp. Strained tomato and vegetable juices. Decaffeinated teas. Sugar-free beverages not sweetened with sugar alcohols. Oral rehydration solutions, if approved by your health care provider. Seasoning and other foods Bouillon, broth, or soups made from recommended foods. What foods are not recommended? The items listed may not be a complete list. Talk with your health care provider about what dietary choices are best for you. Grains Whole grain, whole wheat,   bran, or rye breads, rolls, pastas, and crackers. Wild or brown rice. Whole grain or bran cereals. Barley. Oats and  oatmeal. Corn tortillas or taco shells. Granola. Popcorn. Vegetables Raw vegetables. Fried vegetables. Cabbage, broccoli, Brussels sprouts, artichokes, baked beans, beet greens, corn, kale, legumes, peas, sweet potatoes, and yams. Potato skins. Cooked spinach and cabbage. Fruits Dried fruit, including raisins and dates. Raw fruits. Stewed or dried prunes. Canned fruits with syrup. Meat and other protein foods Fried or fatty meats. Deli meats. Chunky nut butters. Nuts and seeds. Beans and lentils. Berniece Salines. Hot dogs. Sausage. Dairy High-fat cheeses. Whole milk, chocolate milk, and beverages made with milk, such as milk shakes. Half-and-half. Cream. sour cream. Ice cream. Beverages Caffeinated beverages (such as coffee, tea, soda, or energy drinks). Alcoholic beverages. Fruit juices with pulp. Prune juice. Soft drinks sweetened with high-fructose corn syrup or sugar alcohols. High-calorie sports drinks. Fats and oils Butter. Cream sauces. Margarine. Salad oils. Plain salad dressings. Olives. Avocados. Mayonnaise. Sweets and desserts Sweet rolls, doughnuts, and sweet breads. Sugar-free desserts sweetened with sugar alcohols such as xylitol and sorbitol. Seasoning and other foods Honey. Hot sauce. Chili powder. Gravy. Cream-based or milk-based soups. Pancakes and waffles. Summary  When you have diarrhea, the foods you eat and your eating habits are very important.  Make sure you get at least 8-10 cups of fluid each day, or enough to keep your urine clear or pale yellow.  Eat bland foods and gradually reintroduce healthy, nutrient-rich foods as tolerated, or as told by your health care provider.  Avoid high-fiber, fried, greasy, or spicy foods. This information is not intended to replace advice given to you by your health care provider. Make sure you discuss any questions you have with your health care provider. Document Released: 03/08/2004 Document Revised: 12/14/2016 Document Reviewed:  12/14/2016 Elsevier Interactive Patient Education  2018 [employer].  Viral Gastroenteritis, Adult Viral gastroenteritis is also known as the stomach flu. This condition is caused by various viruses. These viruses can be passed from person to person very easily (are very contagious). This condition may affect your stomach, small intestine, and large intestine. It can cause sudden watery diarrhea, fever, and vomiting. Diarrhea and vomiting can make you feel weak and cause you to become dehydrated. You may not be able to keep fluids down. Dehydration can make you tired and thirsty, cause you to have a dry mouth, and decrease how often you urinate. Older adults and people with other diseases or a weak immune system are at higher risk for dehydration. It is important to replace the fluids that you lose from diarrhea and vomiting. If you become severely dehydrated, you may need to get fluids through an IV tube. What are the causes? Gastroenteritis is caused by various viruses, including rotavirus and norovirus. Norovirus is the most common cause in adults. You can get sick by eating food, drinking water, or touching a surface contaminated with one of these viruses. You can also get sick from sharing utensils or other personal items with an infected person. What increases the risk? This condition is more likely to develop in people:  Who have a weak defense system (immune system).  Who live with one or more children who are younger than 37 years old.  Who live in a nursing home.  Who go on cruise ships.  What are the signs or symptoms? Symptoms of this condition start suddenly 1-2 days after exposure to a virus. Symptoms may last a few days or as long as  a week. The most common symptoms are watery diarrhea and vomiting. Other symptoms include:  Fever.  Headache.  Fatigue.  Pain in the abdomen.  Chills.  Weakness.  Nausea.  Muscle aches.  Loss of appetite.  How is this  diagnosed? This condition is diagnosed with a medical history and physical exam. You may also have a stool test to check for viruses or other infections. How is this treated? This condition typically goes away on its own. The focus of treatment is to restore lost fluids (rehydration). Your health care provider may recommend that you take an oral rehydration solution (ORS) to replace important salts and minerals (electrolytes) in your body. Severe cases of this condition may require giving fluids through an IV tube. Treatment may also include medicine to help with your symptoms. Follow these instructions at home: Follow instructions from your health care provider about how to care for yourself at home. Eating and drinking Follow these recommendations as told by your health care provider:  Take an ORS. This is a drink that is sold at pharmacies and retail stores.  Drink clear fluids in small amounts as you are able. Clear fluids include water, ice chips, diluted fruit juice, and low-calorie sports drinks.  Eat bland, easy-to-digest foods in small amounts as you are able. These foods include bananas, applesauce, rice, lean meats, toast, and crackers.  Avoid fluids that contain a lot of sugar or caffeine, such as energy drinks, sports drinks, and soda.  Avoid alcohol.  Avoid spicy or fatty foods.  General instructions   Drink enough fluid to keep your urine clear or pale yellow.  Wash your hands often. If soap and water are not available, use hand sanitizer.  Make sure that all people in your household wash their hands well and often.  Take over-the-counter and prescription medicines only as told by your health care provider.  Rest at home while you recover.  Watch your condition for any changes.  Take a warm bath to relieve any burning or pain from frequent diarrhea episodes.  Keep all follow-up visits as told by your health care provider. This is important. Contact a health care  provider if:  You cannot keep fluids down.  Your symptoms get worse.  You have new symptoms.  You feel light-headed or dizzy.  You have muscle cramps. Get help right away if:  You have chest pain.  You feel extremely weak or you faint.  You see blood in your vomit.  Your vomit looks like coffee grounds.  You have bloody or black stools or stools that look like tar.  You have a severe headache, a stiff neck, or both.  You have a rash.  You have severe pain, cramping, or bloating in your abdomen.  You have trouble breathing or you are breathing very quickly.  Your heart is beating very quickly.  Your skin feels cold and clammy.  You feel confused.  You have pain when you urinate.  You have signs of dehydration, such as: ? Dark urine, very little urine, or no urine. ? Cracked lips. ? Dry mouth. ? Sunken eyes. ? Sleepiness. ? Weakness. This information is not intended to replace advice given to you by your health care provider. Make sure you discuss any questions you have with your health care provider. Document Released: 12/17/2005 Document Revised: 05/30/2016 Document Reviewed: 08/23/2015 Elsevier Interactive Patient Education  [employer].

## 2018-09-02 NOTE — Progress Notes (Signed)
BP 119/66 (BP Location: Left Arm, Patient Position: Sitting, Cuff Size: Normal)   Pulse 90   Temp 98.7 F (37.1 C) (Oral)   Ht 5\' 4"  (1.626 m)   Wt 193 lb 12.8 oz (87.9 kg)   SpO2 98%   BMI 33.27 kg/m    Subjective:    Patient ID: Jesus Guerrero, male    DOB: [birth year], 47 y.o.   MRN: [id number]  HPI: Jesus Guerrero is a 47 y.o. male  Chief Complaint  Patient presents with  . Hospitalization Follow-up  . Abdominal Pain    Patient states he's still having right side lower abdominal pain. Patient states it is constant.   Transition of Care Hospital Follow up-  Still having pain in RLQ, no more diarrhea. Nothing seems to be making the pain better or worse. Diarrhea stopped Thursday. Still on liquid diet.   Hospital/Facility: [facility] D/C Physician: Benjamine Sprague, DO D/C Date: 08/28/18  Records Requested: 09/02/18  Records Received: 09/02/18 Records Reviewed: 09/02/18  Diagnoses on Discharge: Gastroenteritis  Date of interactive Contact within 48 hours of discharge: 08/29/18 Contact was through: phone  Date of 7 day or 14 day face-to-face visit:  09/02/18  within 7 days  Outpatient Encounter Medications as of 09/02/2018  Medication Sig  . acetaminophen (TYLENOL) 500 MG tablet Take 500 mg by mouth every 6 (six) hours as needed.  Marland Kitchen allopurinol (ZYLOPRIM) 100 MG tablet Take 1 tablet (100 mg total) by mouth daily.  Marland Kitchen atorvastatin (LIPITOR) 20 MG tablet Take 1 tablet (20 mg total) by mouth daily.  . cetirizine (ZYRTEC) 10 MG tablet Take 1 tablet (10 mg total) by mouth daily.  . clindamycin-benzoyl peroxide (BENZACLIN) gel APPLY THIN LAYER TO FACE IN THE MORNING. WASH OFF AT BEDTIME.  . cyclobenzaprine (FLEXERIL) 5 MG tablet TAKE 1 TABLET BY MOUTH AT BEDTIME AS NEEDED FOR MUSCLE SPASM  . doxycycline (PERIOSTAT) 20 MG tablet Take 20 mg by mouth 2 (two) times daily with a meal.  . ketoconazole (NIZORAL) 2 % shampoo Apply 1 application topically 2 (two) times a week.  . meloxicam (MOBIC)  15 MG tablet Take 1 tablet (15 mg total) by mouth daily.  . montelukast (SINGULAIR) 10 MG tablet Take 1 tablet (10 mg total) by mouth at bedtime.  . ciprofloxacin (CIPRO) 500 MG tablet Take 1 tablet (500 mg total) by mouth 2 (two) times daily.   Facility-Administered Encounter Medications as of 09/02/2018  Medication  . betamethasone acetate-betamethasone sodium phosphate (CELESTONE) injection 12 mg  . betamethasone acetate-betamethasone sodium phosphate (CELESTONE) injection 3 mg  . betamethasone acetate-betamethasone sodium phosphate (CELESTONE) injection 3 mg    Diagnostic Tests Reviewed:  Stool studies:  Negative.   EXAM: CT ABDOMEN AND PELVIS WITH CONTRAST  TECHNIQUE: Multidetector CT imaging of the abdomen and pelvis was performed using the standard protocol following bolus administration of intravenous contrast.  CONTRAST:  17mL ISOVUE-300 IOPAMIDOL (ISOVUE-300) INJECTION 61%  COMPARISON:  05/30/2015 CT  FINDINGS: Lower chest: Small to moderate-sized hiatal hernia. Heart size is normal. No pericardial effusion. Dependent atelectasis at each lung base.  Hepatobiliary: Cholecystectomy.  No mass or biliary dilatation.  Pancreas: Normal  Spleen: Normal  Adrenals/Urinary Tract: Normal bilateral adrenal glands. Cortical enhancement is symmetric with reference both kidneys. No enhancing mass lesion, nephrolithiasis nor hydroureteronephrosis. The urinary bladder is slightly thick-walled but this may be secondary to underdistention. Cystitis is not entirely excluded.  Stomach/Bowel: Physiologic distention of the stomach. Duodenal sweep and ligament of Treitz position are normal.  No bowel obstruction. Normal appendix, distal and terminal ileum. No inflammation of the colon.  Vascular/Lymphatic: Nonaneurysmal aorta.  No lymphadenopathy.  Reproductive: Normal size prostate and seminal vesicles.  Other: Bilateral fat containing inguinal  hernias.  Musculoskeletal: Mild disc flattening L2-3 and L3-4. No acute nor suspicious osseous lesions. Gentle dextroconvex curvature of the upper lumbar spine.  IMPRESSION: 1. Mild urinary bladder wall thickening similar to prior. Correlate to exclude cystitis. 2. Small to moderate-sized hiatal hernia. No bowel obstruction or inflammation. 3. Fat containing bilateral inguinal hernias similar in appearance to prior.  EXAM: ABDOMEN - 1 VIEW  COMPARISON:  CT scan of August 26, 2018. Radiographs of May 07, 2015.  FINDINGS: The bowel gas pattern is normal. No radio-opaque calculi or other significant radiographic abnormality are seen. Status post cholecystectomy.  IMPRESSION: No evidence of bowel obstruction or ileus.   Electronically Signed   By: Marijo Conception, M.D.   On: 08/28/2018 11:52  Disposition: Home  Consults: None  Discharge Instructions: Follow up here, check stool studies  Disease/illness Education: Given today in writing  Home Health/Community Services Discussions/Referrals: N/A  Establishment or re-establishment of referral orders for community resources: N/A  Discussion with other health care providers: None  Assessment and Support of treatment regimen adherence: Good  Appointments Coordinated with: Patient   Education for self-management, independent living, and ADLs: Given in writing  Relevant past medical, surgical, family and social history reviewed and updated as indicated. Interim medical history since our last visit reviewed. Allergies and medications reviewed and updated.  Review of Systems  Constitutional: Negative.   Respiratory: Negative.   Cardiovascular: Negative.   Gastrointestinal: Positive for abdominal pain. Negative for abdominal distention, anal bleeding, blood in stool, constipation, diarrhea, nausea, rectal pain and vomiting.  Musculoskeletal: Negative.   Skin: Negative.   Psychiatric/Behavioral: Negative.      Per HPI unless specifically indicated above     Objective:    BP 119/66 (BP Location: Left Arm, Patient Position: Sitting, Cuff Size: Normal)   Pulse 90   Temp 98.7 F (37.1 C) (Oral)   Ht 5\' 4"  (1.626 m)   Wt 193 lb 12.8 oz (87.9 kg)   SpO2 98%   BMI 33.27 kg/m   Wt Readings from Last 3 Encounters:  09/02/18 193 lb 12.8 oz (87.9 kg)  08/26/18 191 lb (86.6 kg)  07/18/18 191 lb 9 oz (86.9 kg)    Physical Exam  Constitutional: He is oriented to person, place, and time. He appears well-developed and well-nourished.  Non-toxic appearance. He does not appear ill. No distress.  HENT:  Head: Normocephalic and atraumatic.  Right Ear: Hearing normal.  Left Ear: Hearing normal.  Nose: Nose normal.  Mouth/Throat: Oropharynx is clear and moist. No oropharyngeal exudate.  Eyes: Conjunctivae and lids are normal. Right eye exhibits no discharge. Left eye exhibits no discharge. No scleral icterus.  Cardiovascular: Normal rate, regular rhythm, normal heart sounds and intact distal pulses. Exam reveals no gallop and no friction rub.  No murmur heard. Pulmonary/Chest: Effort normal and breath sounds normal. No stridor. No respiratory distress. He has no wheezes. He has no rhonchi. He has no rales. He exhibits no tenderness.  Abdominal: Soft. Normal appearance and bowel sounds are normal. He exhibits no shifting dullness, no distension, no pulsatile liver, no fluid wave, no abdominal bruit, no ascites, no pulsatile midline mass and no mass. There is no hepatosplenomegaly, splenomegaly or hepatomegaly. There is tenderness in the right lower quadrant. There is no rigidity, no  rebound, no guarding, no CVA tenderness, no tenderness at McBurney's point and negative Murphy's sign. No hernia. Hernia confirmed negative in the ventral area, confirmed negative in the right inguinal area and confirmed negative in the left inguinal area.  Musculoskeletal: Normal range of motion.  Neurological: He is alert and  oriented to person, place, and time.  Skin: Skin is warm, dry and intact. Capillary refill takes less than 2 seconds. No rash noted. He is not diaphoretic. No cyanosis or erythema. No pallor.  Psychiatric: He has a normal mood and affect. His speech is normal and behavior is normal. Judgment and thought content normal. Cognition and memory are normal.  Nursing note and vitals reviewed.   Results for orders placed or performed during the hospital encounter of 08/26/18  Gastrointestinal Panel by PCR , Stool  Result Value Ref Range   Campylobacter species NOT DETECTED NOT DETECTED   Plesimonas shigelloides NOT DETECTED NOT DETECTED   Salmonella species NOT DETECTED NOT DETECTED   Yersinia enterocolitica NOT DETECTED NOT DETECTED   Vibrio species NOT DETECTED NOT DETECTED   Vibrio cholerae NOT DETECTED NOT DETECTED   Enteroaggregative E coli (EAEC) NOT DETECTED NOT DETECTED   Enteropathogenic E coli (EPEC) NOT DETECTED NOT DETECTED   Enterotoxigenic E coli (ETEC) NOT DETECTED NOT DETECTED   Shiga like toxin producing E coli (STEC) NOT DETECTED NOT DETECTED   Shigella/Enteroinvasive E coli (EIEC) NOT DETECTED NOT DETECTED   Cryptosporidium NOT DETECTED NOT DETECTED   Cyclospora cayetanensis NOT DETECTED NOT DETECTED   Entamoeba histolytica NOT DETECTED NOT DETECTED   Giardia lamblia NOT DETECTED NOT DETECTED   Adenovirus F40/41 NOT DETECTED NOT DETECTED   Astrovirus NOT DETECTED NOT DETECTED   Norovirus GI/GII NOT DETECTED NOT DETECTED   Rotavirus A NOT DETECTED NOT DETECTED   Sapovirus (I, II, IV, and V) NOT DETECTED NOT DETECTED  C difficile quick scan w PCR reflex  Result Value Ref Range   C Diff antigen NEGATIVE NEGATIVE   C Diff toxin NEGATIVE NEGATIVE   C Diff interpretation No C. difficile detected.   Lipase, blood  Result Value Ref Range   Lipase 28 11 - 51 U/L  Comprehensive metabolic panel  Result Value Ref Range   Sodium 140 135 - 145 mmol/L   Potassium 4.4 3.5 - 5.1  mmol/L   Chloride 110 98 - 111 mmol/L   CO2 21 (L) 22 - 32 mmol/L   Glucose, Bld 163 (H) 70 - 99 mg/dL   BUN 29 (H) 6 - 20 mg/dL   Creatinine, Ser 1.24 0.61 - 1.24 mg/dL   Calcium 9.0 8.9 - 10.3 mg/dL   Total Protein 8.1 6.5 - 8.1 g/dL   Albumin 4.3 3.5 - 5.0 g/dL   AST 30 15 - 41 U/L   ALT 26 0 - 44 U/L   Alkaline Phosphatase 87 38 - 126 U/L   Total Bilirubin 0.8 0.3 - 1.2 mg/dL   GFR calc non Af Amer >60 >60 mL/min   GFR calc Af Amer >60 >60 mL/min   Anion gap 9 5 - 15  CBC  Result Value Ref Range   WBC 19.5 (H) 3.8 - 10.6 K/uL   RBC 4.93 4.40 - 5.90 MIL/uL   Hemoglobin 15.1 13.0 - 18.0 g/dL   HCT 43.9 40.0 - 52.0 %   MCV 89.0 80.0 - 100.0 fL   MCH 30.7 26.0 - 34.0 pg   MCHC 34.4 32.0 - 36.0 g/dL   RDW 13.5 11.5 -  14.5 %   Platelets 245 150 - 440 K/uL  Urinalysis, Complete w Microscopic  Result Value Ref Range   Color, Urine YELLOW (A) YELLOW   APPearance CLEAR (A) CLEAR   Specific Gravity, Urine 1.025 1.005 - 1.030   pH 5.0 5.0 - 8.0   Glucose, UA NEGATIVE NEGATIVE mg/dL   Hgb urine dipstick NEGATIVE NEGATIVE   Bilirubin Urine NEGATIVE NEGATIVE   Ketones, ur NEGATIVE NEGATIVE mg/dL   Protein, ur NEGATIVE NEGATIVE mg/dL   Nitrite NEGATIVE NEGATIVE   Leukocytes, UA NEGATIVE NEGATIVE   RBC / HPF 0-5 0 - 5 RBC/hpf   WBC, UA 0-5 0 - 5 WBC/hpf   Bacteria, UA NONE SEEN NONE SEEN   Squamous Epithelial / LPF NONE SEEN 0 - 5   Mucus PRESENT   HIV antibody (Routine Testing)  Result Value Ref Range   HIV Screen 4th Generation wRfx Non Reactive Non Reactive  Basic metabolic panel  Result Value Ref Range   Sodium 141 135 - 145 mmol/L   Potassium 3.9 3.5 - 5.1 mmol/L   Chloride 112 (H) 98 - 111 mmol/L   CO2 23 22 - 32 mmol/L   Glucose, Bld 115 (H) 70 - 99 mg/dL   BUN 23 (H) 6 - 20 mg/dL   Creatinine, Ser 0.90 0.61 - 1.24 mg/dL   Calcium 8.1 (L) 8.9 - 10.3 mg/dL   GFR calc non Af Amer >60 >60 mL/min   GFR calc Af Amer >60 >60 mL/min   Anion gap 6 5 - 15  Magnesium   Result Value Ref Range   Magnesium 1.8 1.7 - 2.4 mg/dL  Phosphorus  Result Value Ref Range   Phosphorus 2.7 2.5 - 4.6 mg/dL  CBC  Result Value Ref Range   WBC 10.4 3.8 - 10.6 K/uL   RBC 4.28 (L) 4.40 - 5.90 MIL/uL   Hemoglobin 13.5 13.0 - 18.0 g/dL   HCT 38.3 (L) 40.0 - 52.0 %   MCV 89.4 80.0 - 100.0 fL   MCH 31.5 26.0 - 34.0 pg   MCHC 35.2 32.0 - 36.0 g/dL   RDW 13.7 11.5 - 14.5 %   Platelets 231 150 - 440 K/uL  CBC  Result Value Ref Range   WBC 8.2 3.8 - 10.6 K/uL   RBC 4.24 (L) 4.40 - 5.90 MIL/uL   Hemoglobin 13.1 13.0 - 18.0 g/dL   HCT 38.3 (L) 40.0 - 52.0 %   MCV 90.3 80.0 - 100.0 fL   MCH 30.8 26.0 - 34.0 pg   MCHC 34.1 32.0 - 36.0 g/dL   RDW 13.6 11.5 - 14.5 %   Platelets 213 150 - 440 K/uL  CBC with Differential/Platelet  Result Value Ref Range   WBC 8.2 3.8 - 10.6 K/uL   RBC 4.56 4.40 - 5.90 MIL/uL   Hemoglobin 14.1 13.0 - 18.0 g/dL   HCT 40.3 40.0 - 52.0 %   MCV 88.4 80.0 - 100.0 fL   MCH 30.9 26.0 - 34.0 pg   MCHC 35.0 32.0 - 36.0 g/dL   RDW 13.6 11.5 - 14.5 %   Platelets 235 150 - 440 K/uL   Neutrophils Relative % 74 %   Neutro Abs 6.1 1.4 - 6.5 K/uL   Lymphocytes Relative 18 %   Lymphs Abs 1.5 1.0 - 3.6 K/uL   Monocytes Relative 6 %   Monocytes Absolute 0.5 0.2 - 1.0 K/uL   Eosinophils Relative 2 %   Eosinophils Absolute 0.2 0 - 0.7 K/uL  Basophils Relative 0 %   Basophils Absolute 0.0 0 - 0.1 K/uL  Basic metabolic panel  Result Value Ref Range   Sodium 140 135 - 145 mmol/L   Potassium 4.1 3.5 - 5.1 mmol/L   Chloride 104 98 - 111 mmol/L   CO2 27 22 - 32 mmol/L   Glucose, Bld 132 (H) 70 - 99 mg/dL   BUN 13 6 - 20 mg/dL   Creatinine, Ser 1.00 0.61 - 1.24 mg/dL   Calcium 8.6 (L) 8.9 - 10.3 mg/dL   GFR calc non Af Amer >60 >60 mL/min   GFR calc Af Amer >60 >60 mL/min   Anion gap 9 5 - 15  Magnesium  Result Value Ref Range   Magnesium 1.9 1.7 - 2.4 mg/dL  Phosphorus  Result Value Ref Range   Phosphorus 1.7 (L) 2.5 - 4.6 mg/dL       Assessment & Plan:   Problem List Items Addressed This Visit    None    Visit Diagnoses    Acute cystitis without hematuria    -  Primary   Pain is where he usually has issues with UTIs- will treat with cipro and recheck in 1 week, if not better, will get him into GI. Call with any concerns.    Gastroenteritis       Resolved. Continues with pain. Call with any concerns.    Need for influenza vaccination       Flu shot given today.   Relevant Orders   Flu Vaccine QUAD 6+ mos PF IM (Fluarix Quad PF) (Completed)       Follow up plan: Return in about 1 week (around 09/09/2018) for follow up abdominal pain.

## 2018-09-03 ENCOUNTER — Ambulatory Visit: Payer: Medicaid Other

## 2018-09-04 ENCOUNTER — Encounter: Payer: Self-pay | Admitting: Family Medicine

## 2018-09-04 ENCOUNTER — Ambulatory Visit: Payer: Self-pay | Admitting: *Deleted

## 2018-09-04 NOTE — Telephone Encounter (Signed)
Attempted to reach pt x 1. Dr. Wynetta Emery has been responding via [facility].

## 2018-09-09 ENCOUNTER — Encounter: Payer: Self-pay | Admitting: Family Medicine

## 2018-09-09 ENCOUNTER — Ambulatory Visit (INDEPENDENT_AMBULATORY_CARE_PROVIDER_SITE_OTHER): Payer: Medicaid Other | Admitting: Family Medicine

## 2018-09-09 VITALS — BP 136/71 | HR 92 | Temp 98.5°F | Ht 64.0 in | Wt 193.4 lb

## 2018-09-09 DIAGNOSIS — R58 Hemorrhage, not elsewhere classified: Secondary | ICD-10-CM

## 2018-09-09 DIAGNOSIS — R1031 Right lower quadrant pain: Secondary | ICD-10-CM | POA: Diagnosis not present

## 2018-09-09 DIAGNOSIS — R1032 Left lower quadrant pain: Secondary | ICD-10-CM

## 2018-09-09 DIAGNOSIS — G8929 Other chronic pain: Secondary | ICD-10-CM | POA: Diagnosis not present

## 2018-09-09 LAB — MICROSCOPIC EXAMINATION: Bacteria, UA: NONE SEEN

## 2018-09-09 LAB — UA/M W/RFLX CULTURE, ROUTINE
Bilirubin, UA: NEGATIVE
Glucose, UA: NEGATIVE
Ketones, UA: NEGATIVE
Leukocytes, UA: NEGATIVE
Nitrite, UA: NEGATIVE
Protein, UA: NEGATIVE
Specific Gravity, UA: 1.025 (ref 1.005–1.030)
Urobilinogen, Ur: 0.2 mg/dL (ref 0.2–1.0)
pH, UA: 5 (ref 5.0–7.5)

## 2018-09-09 NOTE — Patient Instructions (Signed)
Thursday September 19 1:15PM Dr. Rae Halsted [facility] - [facility] [address] [place], [place] [address]  Main: [phone]

## 2018-09-09 NOTE — Progress Notes (Signed)
BP 136/71 (BP Location: Left Arm, Patient Position: Sitting, Cuff Size: Normal)   Pulse 92   Temp 98.5 F (36.9 C)   Ht 5\' 4"  (1.626 m)   Wt 193 lb 6 oz (87.7 kg)   SpO2 98%   BMI 33.19 kg/m    Subjective:    Patient ID: Jesus Guerrero, male    DOB: 05/05/1971, 47 y.o.   MRN: [id number]  HPI: Jesus Guerrero is a 47 y.o. male  Chief Complaint  Patient presents with  . Abdominal Pain   Jesus Guerrero presents today for follow up on his abdominal pain. He states that he is no better following taking his cipro. He is quite concerned about a bruise on his LLQ. He's not sure where it came from. He was in the hospital from 08/26/18-08/28/18 for gastroenteritis. He has not had any diarrhea since 8/29. He has continued with suprapubic/RLQ pain. Pain is aching and sore in nature. Nothing makes it better or worse. No changes with his cipro. No radiation. No fevers. No nausea. No vomiting. No constipation. He is otherwise feeling well with no other concerns or complaints at this time.    Relevant past medical, surgical, family and social history reviewed and updated as indicated. Interim medical history since our last visit reviewed. Allergies and medications reviewed and updated.  Review of Systems  Constitutional: Negative.   Cardiovascular: Negative.   Gastrointestinal: Positive for abdominal pain. Negative for abdominal distention, anal bleeding, blood in stool, constipation, diarrhea, nausea, rectal pain and vomiting.  Genitourinary: Negative.   Skin: Positive for color change (small bruise on LLq of abdomen). Negative for pallor, rash and wound.  Psychiatric/Behavioral: Negative.     Per HPI unless specifically indicated above     Objective:    BP 136/71 (BP Location: Left Arm, Patient Position: Sitting, Cuff Size: Normal)   Pulse 92   Temp 98.5 F (36.9 C)   Ht 5\' 4"  (1.626 m)   Wt 193 lb 6 oz (87.7 kg)   SpO2 98%   BMI 33.19 kg/m   Wt Readings from Last 3 Encounters:    09/09/18 193 lb 6 oz (87.7 kg)  09/02/18 193 lb 12.8 oz (87.9 kg)  08/26/18 191 lb (86.6 kg)    Physical Exam  Constitutional: He is oriented to person, place, and time. He appears well-developed and well-nourished. No distress.  HENT:  Head: Normocephalic and atraumatic.  Right Ear: Hearing normal.  Left Ear: Hearing normal.  Nose: Nose normal.  Eyes: Conjunctivae and lids are normal. Right eye exhibits no discharge. Left eye exhibits no discharge. No scleral icterus.  Cardiovascular: Normal rate, regular rhythm, normal heart sounds and intact distal pulses. Exam reveals no gallop and no friction rub.  No murmur heard. Pulmonary/Chest: Effort normal and breath sounds normal. No stridor. No respiratory distress. He has no wheezes. He has no rhonchi. He has no rales. He exhibits no tenderness.  Abdominal: Soft. Normal appearance and bowel sounds are normal. He exhibits no shifting dullness, no distension, no pulsatile liver, no fluid wave, no abdominal bruit, no ascites, no pulsatile midline mass and no mass. There is tenderness in the right lower quadrant.  Musculoskeletal: Normal range of motion.  Neurological: He is alert and oriented to person, place, and time.  Skin: Skin is warm, dry and intact. Capillary refill takes less than 2 seconds. No rash noted. He is not diaphoretic. No cyanosis or erythema. No pallor.  Small resolving bruise on LLQ of abdomen  Psychiatric: He has a normal mood and affect. His speech is normal and behavior is normal. Judgment and thought content normal. Cognition and memory are normal.  Nursing note and vitals reviewed.   Results for orders placed or performed during the hospital encounter of 08/26/18  Gastrointestinal Panel by PCR , Stool  Result Value Ref Range   Campylobacter species NOT DETECTED NOT DETECTED   Plesimonas shigelloides NOT DETECTED NOT DETECTED   Salmonella species NOT DETECTED NOT DETECTED   Yersinia enterocolitica NOT DETECTED NOT  DETECTED   Vibrio species NOT DETECTED NOT DETECTED   Vibrio cholerae NOT DETECTED NOT DETECTED   Enteroaggregative E coli (EAEC) NOT DETECTED NOT DETECTED   Enteropathogenic E coli (EPEC) NOT DETECTED NOT DETECTED   Enterotoxigenic E coli (ETEC) NOT DETECTED NOT DETECTED   Shiga like toxin producing E coli (STEC) NOT DETECTED NOT DETECTED   Shigella/Enteroinvasive E coli (EIEC) NOT DETECTED NOT DETECTED   Cryptosporidium NOT DETECTED NOT DETECTED   Cyclospora cayetanensis NOT DETECTED NOT DETECTED   Entamoeba histolytica NOT DETECTED NOT DETECTED   Giardia lamblia NOT DETECTED NOT DETECTED   Adenovirus F40/41 NOT DETECTED NOT DETECTED   Astrovirus NOT DETECTED NOT DETECTED   Norovirus GI/GII NOT DETECTED NOT DETECTED   Rotavirus A NOT DETECTED NOT DETECTED   Sapovirus (I, II, IV, and V) NOT DETECTED NOT DETECTED  C difficile quick scan w PCR reflex  Result Value Ref Range   C Diff antigen NEGATIVE NEGATIVE   C Diff toxin NEGATIVE NEGATIVE   C Diff interpretation No C. difficile detected.   Lipase, blood  Result Value Ref Range   Lipase 28 11 - 51 U/L  Comprehensive metabolic panel  Result Value Ref Range   Sodium 140 135 - 145 mmol/L   Potassium 4.4 3.5 - 5.1 mmol/L   Chloride 110 98 - 111 mmol/L   CO2 21 (L) 22 - 32 mmol/L   Glucose, Bld 163 (H) 70 - 99 mg/dL   BUN 29 (H) 6 - 20 mg/dL   Creatinine, Ser 1.24 0.61 - 1.24 mg/dL   Calcium 9.0 8.9 - 10.3 mg/dL   Total Protein 8.1 6.5 - 8.1 g/dL   Albumin 4.3 3.5 - 5.0 g/dL   AST 30 15 - 41 U/L   ALT 26 0 - 44 U/L   Alkaline Phosphatase 87 38 - 126 U/L   Total Bilirubin 0.8 0.3 - 1.2 mg/dL   GFR calc non Af Amer >60 >60 mL/min   GFR calc Af Amer >60 >60 mL/min   Anion gap 9 5 - 15  CBC  Result Value Ref Range   WBC 19.5 (H) 3.8 - 10.6 K/uL   RBC 4.93 4.40 - 5.90 MIL/uL   Hemoglobin 15.1 13.0 - 18.0 g/dL   HCT 43.9 40.0 - 52.0 %   MCV 89.0 80.0 - 100.0 fL   MCH 30.7 26.0 - 34.0 pg   MCHC 34.4 32.0 - 36.0 g/dL   RDW  13.5 11.5 - 14.5 %   Platelets 245 150 - 440 K/uL  Urinalysis, Complete w Microscopic  Result Value Ref Range   Color, Urine YELLOW (A) YELLOW   APPearance CLEAR (A) CLEAR   Specific Gravity, Urine 1.025 1.005 - 1.030   pH 5.0 5.0 - 8.0   Glucose, UA NEGATIVE NEGATIVE mg/dL   Hgb urine dipstick NEGATIVE NEGATIVE   Bilirubin Urine NEGATIVE NEGATIVE   Ketones, ur NEGATIVE NEGATIVE mg/dL   Protein, ur NEGATIVE NEGATIVE mg/dL   Nitrite  NEGATIVE NEGATIVE   Leukocytes, UA NEGATIVE NEGATIVE   RBC / HPF 0-5 0 - 5 RBC/hpf   WBC, UA 0-5 0 - 5 WBC/hpf   Bacteria, UA NONE SEEN NONE SEEN   Squamous Epithelial / LPF NONE SEEN 0 - 5   Mucus PRESENT   HIV antibody (Routine Testing)  Result Value Ref Range   HIV Screen 4th Generation wRfx Non Reactive Non Reactive  Basic metabolic panel  Result Value Ref Range   Sodium 141 135 - 145 mmol/L   Potassium 3.9 3.5 - 5.1 mmol/L   Chloride 112 (H) 98 - 111 mmol/L   CO2 23 22 - 32 mmol/L   Glucose, Bld 115 (H) 70 - 99 mg/dL   BUN 23 (H) 6 - 20 mg/dL   Creatinine, Ser 0.90 0.61 - 1.24 mg/dL   Calcium 8.1 (L) 8.9 - 10.3 mg/dL   GFR calc non Af Amer >60 >60 mL/min   GFR calc Af Amer >60 >60 mL/min   Anion gap 6 5 - 15  Magnesium  Result Value Ref Range   Magnesium 1.8 1.7 - 2.4 mg/dL  Phosphorus  Result Value Ref Range   Phosphorus 2.7 2.5 - 4.6 mg/dL  CBC  Result Value Ref Range   WBC 10.4 3.8 - 10.6 K/uL   RBC 4.28 (L) 4.40 - 5.90 MIL/uL   Hemoglobin 13.5 13.0 - 18.0 g/dL   HCT 38.3 (L) 40.0 - 52.0 %   MCV 89.4 80.0 - 100.0 fL   MCH 31.5 26.0 - 34.0 pg   MCHC 35.2 32.0 - 36.0 g/dL   RDW 13.7 11.5 - 14.5 %   Platelets 231 150 - 440 K/uL  CBC  Result Value Ref Range   WBC 8.2 3.8 - 10.6 K/uL   RBC 4.24 (L) 4.40 - 5.90 MIL/uL   Hemoglobin 13.1 13.0 - 18.0 g/dL   HCT 38.3 (L) 40.0 - 52.0 %   MCV 90.3 80.0 - 100.0 fL   MCH 30.8 26.0 - 34.0 pg   MCHC 34.1 32.0 - 36.0 g/dL   RDW 13.6 11.5 - 14.5 %   Platelets 213 150 - 440 K/uL  CBC  with Differential/Platelet  Result Value Ref Range   WBC 8.2 3.8 - 10.6 K/uL   RBC 4.56 4.40 - 5.90 MIL/uL   Hemoglobin 14.1 13.0 - 18.0 g/dL   HCT 40.3 40.0 - 52.0 %   MCV 88.4 80.0 - 100.0 fL   MCH 30.9 26.0 - 34.0 pg   MCHC 35.0 32.0 - 36.0 g/dL   RDW 13.6 11.5 - 14.5 %   Platelets 235 150 - 440 K/uL   Neutrophils Relative % 74 %   Neutro Abs 6.1 1.4 - 6.5 K/uL   Lymphocytes Relative 18 %   Lymphs Abs 1.5 1.0 - 3.6 K/uL   Monocytes Relative 6 %   Monocytes Absolute 0.5 0.2 - 1.0 K/uL   Eosinophils Relative 2 %   Eosinophils Absolute 0.2 0 - 0.7 K/uL   Basophils Relative 0 %   Basophils Absolute 0.0 0 - 0.1 K/uL  Basic metabolic panel  Result Value Ref Range   Sodium 140 135 - 145 mmol/L   Potassium 4.1 3.5 - 5.1 mmol/L   Chloride 104 98 - 111 mmol/L   CO2 27 22 - 32 mmol/L   Glucose, Bld 132 (H) 70 - 99 mg/dL   BUN 13 6 - 20 mg/dL   Creatinine, Ser 1.00 0.61 - 1.24 mg/dL   Calcium 8.6 (  L) 8.9 - 10.3 mg/dL   GFR calc non Af Amer >60 >60 mL/min   GFR calc Af Amer >60 >60 mL/min   Anion gap 9 5 - 15  Magnesium  Result Value Ref Range   Magnesium 1.9 1.7 - 2.4 mg/dL  Phosphorus  Result Value Ref Range   Phosphorus 1.7 (L) 2.5 - 4.6 mg/dL      Assessment & Plan:   Problem List Items Addressed This Visit      Other   Chronic bilateral lower abdominal pain - Primary    Jesus Guerrero has been having issue with abdominal pain for several years that have ended him in the ER on more than one occasion. Mostly functional, he was just hospitalized for gastroenteritis and doesn't seem to be feeling any better. Will get him into see GI for evaluation. Referral generated today and appointment made.       Relevant Orders   Ambulatory referral to Gastroenterology    Other Visit Diagnoses    Ecchymosis       Likely due to his heparin shots in the hospital. Reassured patient.        Follow up plan: Return As scheduled.

## 2018-09-09 NOTE — Assessment & Plan Note (Signed)
Jesus Guerrero has been having issue with abdominal pain for several years that have ended him in the ER on more than one occasion. Mostly functional, he was just hospitalized for gastroenteritis and doesn't seem to be feeling any better. Will get him into see GI for evaluation. Referral generated today and appointment made.

## 2018-09-15 ENCOUNTER — Inpatient Hospital Stay: Payer: Medicaid Other | Admitting: Family Medicine

## 2018-09-18 ENCOUNTER — Ambulatory Visit: Payer: Medicaid Other | Admitting: Gastroenterology

## 2018-09-19 ENCOUNTER — Other Ambulatory Visit: Payer: Self-pay | Admitting: Unknown Physician Specialty

## 2018-09-19 NOTE — Telephone Encounter (Signed)
Flexeril refill Last refill 08/15/18 PCP M Wynetta Emery LOV  06/23/18 Pharmacy [facility] [address] [employer]

## 2018-09-22 ENCOUNTER — Encounter: Payer: Self-pay | Admitting: Family Medicine

## 2018-09-23 ENCOUNTER — Encounter: Payer: Self-pay | Admitting: Family Medicine

## 2018-09-26 ENCOUNTER — Encounter: Payer: Self-pay | Admitting: Family Medicine

## 2018-09-28 ENCOUNTER — Encounter: Payer: Self-pay | Admitting: Family Medicine

## 2018-09-29 ENCOUNTER — Encounter: Payer: Self-pay | Admitting: Family Medicine

## 2018-09-30 ENCOUNTER — Encounter: Payer: Self-pay | Admitting: Family Medicine

## 2018-10-01 NOTE — Telephone Encounter (Signed)
LVM for pt to call back to schedule an appt.  

## 2018-10-01 NOTE — Telephone Encounter (Signed)
Called and spoke with Mr Freddi Che and he stated that he would be going out of town and would be unable to bring Jesus Guerrero to an appt but he would try to get in touch with him about the openings tomorrow.

## 2018-10-08 ENCOUNTER — Encounter: Payer: Self-pay | Admitting: Family Medicine

## 2018-10-09 ENCOUNTER — Encounter: Payer: Self-pay | Admitting: Family Medicine

## 2018-10-11 ENCOUNTER — Encounter: Payer: Self-pay | Admitting: Family Medicine

## 2018-10-13 ENCOUNTER — Encounter: Payer: Self-pay | Admitting: Family Medicine

## 2018-10-14 ENCOUNTER — Encounter: Payer: Self-pay | Admitting: Family Medicine

## 2018-10-16 ENCOUNTER — Other Ambulatory Visit: Payer: Self-pay | Admitting: Family Medicine

## 2018-10-16 NOTE — Telephone Encounter (Signed)
Requested medication (s) are due for refill today: Due 10/19/18  Requested medication (s) are on the active medication list: yes    Last refill: 09/19/18  Future visit scheduled yes  10/17/18  Dr. Wynetta Emery  Notes to clinic:  Requested Prescriptions  Pending Prescriptions Disp Refills   cyclobenzaprine (FLEXERIL) 5 MG tablet [Pharmacy Med Name: CYCLOBENZAPR 5MG     TAB] 30 tablet 0    Sig: TAKE 1 TABLET BY MOUTH AT BEDTIME AS NEEDED FOR MUSCLE SPASM     Not Delegated - Analgesics:  Muscle Relaxants Failed - 10/16/2018  4:19 PM      Failed - This refill cannot be delegated      Passed - Valid encounter within last 6 months    Recent Outpatient Visits          1 month ago Right lower quadrant abdominal pain   [place], Megan P, DO   1 month ago Acute cystitis without hematuria   [place], Megan P, DO   3 months ago Acute idiopathic gout involving toe of left foot   [place], Megan P, DO   3 months ago Acute bilateral low back pain without sciatica   [place], Megan P, DO   4 months ago Tension headache   [place], Barb Merino, DO      Future Appointments            Tomorrow Volney American, PA-C [facility], [place]   In 6 days Vanga, Tally Due, MD [place]   In 2 weeks Wynetta Emery, Barb Merino, DO [employer], [place]

## 2018-10-17 ENCOUNTER — Other Ambulatory Visit: Payer: Self-pay

## 2018-10-17 ENCOUNTER — Encounter: Payer: Self-pay | Admitting: Family Medicine

## 2018-10-17 ENCOUNTER — Ambulatory Visit: Payer: Medicaid Other | Admitting: Family Medicine

## 2018-10-17 VITALS — BP 142/80 | HR 90 | Temp 98.5°F | Ht 64.0 in | Wt 194.1 lb

## 2018-10-17 DIAGNOSIS — R21 Rash and other nonspecific skin eruption: Secondary | ICD-10-CM

## 2018-10-17 MED ORDER — KETOCONAZOLE 2 % EX CREA: 1.0000 "application " | TOPICAL_CREAM | Freq: Two times a day (BID) | CUTANEOUS | 0 refills | Status: DC

## 2018-10-17 NOTE — Progress Notes (Signed)
   BP (!) 142/80   Pulse 90   Temp 98.5 F (36.9 C) (Oral)   Ht 5\' 4"  (1.626 m)   Wt 194 lb 1.6 oz (88 kg)   SpO2 98%   BMI 33.32 kg/m    Subjective:    Patient ID: Jesus Guerrero, male    DOB: 1971-09-30, 47 y.o.   MRN: [id number]  HPI: Jesus Guerrero is a 47 y.o. male  Chief Complaint  Patient presents with  . Rash    pt states he has a rash on his left arm that itches, states her has tried using 2 different creams with no relief   Here today with an itchy rash on left wrist that's been there for several weeks. Tried neosporin and benadryl cream with no relief. No new products, recent travel, change in diet, house pets.   Relevant past medical, surgical, family and social history reviewed and updated as indicated. Interim medical history since our last visit reviewed. Allergies and medications reviewed and updated.  Review of Systems  Per HPI unless specifically indicated above     Objective:    BP (!) 142/80   Pulse 90   Temp 98.5 F (36.9 C) (Oral)   Ht 5\' 4"  (1.626 m)   Wt 194 lb 1.6 oz (88 kg)   SpO2 98%   BMI 33.32 kg/m   Wt Readings from Last 3 Encounters:  10/17/18 194 lb 1.6 oz (88 kg)  09/09/18 193 lb 6 oz (87.7 kg)  09/02/18 193 lb 12.8 oz (87.9 kg)    Physical Exam  Constitutional: He appears well-developed and well-nourished.  HENT:  Head: Atraumatic.  Eyes: Conjunctivae and EOM are normal.  Neck: Normal range of motion. Neck supple.  Cardiovascular: Normal rate and regular rhythm.  Pulmonary/Chest: Effort normal and breath sounds normal.  Musculoskeletal: Normal range of motion.  Neurological: He is alert.  Skin: Skin is warm and dry. Rash (scaly circular rash with raised edges on left wrist) noted.  Psychiatric: He has a normal mood and affect. His behavior is normal.  Nursing note and vitals reviewed.   Results for orders placed or performed in visit on 09/09/18  Microscopic Examination  Result Value Ref Range   WBC, UA 0-5 0 - 5  /hpf   RBC, UA 0-2 0 - 2 /hpf   Epithelial Cells (non renal) 0-10 0 - 10 /hpf   Bacteria, UA None seen None seen/Few  UA/M w/rflx Culture, Routine  Result Value Ref Range   Specific Gravity, UA 1.025 1.005 - 1.030   pH, UA 5.0 5.0 - 7.5   Color, UA Yellow Yellow   Appearance Ur Hazy (A) Clear   Leukocytes, UA Negative Negative   Protein, UA Negative Negative/Trace   Glucose, UA Negative Negative   Ketones, UA Negative Negative   RBC, UA Trace (A) Negative   Bilirubin, UA Negative Negative   Urobilinogen, Ur 0.2 0.2 - 1.0 mg/dL   Nitrite, UA Negative Negative   Microscopic Examination See below:       Assessment & Plan:   Problem List Items Addressed This Visit    None    Visit Diagnoses    Rash    -  Primary   Consistent with ringworm, will tx with ketoconazole cream and moisturizers. F/u if not improving.        Follow up plan: Return if symptoms worsen or fail to improve.

## 2018-10-19 NOTE — Patient Instructions (Signed)
Follow up as needed

## 2018-10-22 ENCOUNTER — Other Ambulatory Visit: Payer: Self-pay

## 2018-10-22 ENCOUNTER — Encounter: Payer: Self-pay | Admitting: Gastroenterology

## 2018-10-22 ENCOUNTER — Ambulatory Visit: Payer: Medicaid Other | Admitting: Gastroenterology

## 2018-10-22 VITALS — BP 130/73 | HR 96 | Resp 17 | Ht 64.0 in | Wt 193.8 lb

## 2018-10-22 DIAGNOSIS — R1030 Lower abdominal pain, unspecified: Secondary | ICD-10-CM

## 2018-10-22 DIAGNOSIS — K635 Polyp of colon: Secondary | ICD-10-CM | POA: Diagnosis not present

## 2018-10-22 MED ORDER — HYOSCYAMINE SULFATE ER 0.375 MG PO TB12: 0.3750 mg | ORAL_TABLET | Freq: Two times a day (BID) | ORAL | 0 refills | Status: DC
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
## 2018-10-23 ENCOUNTER — Encounter: Payer: Self-pay | Admitting: Gastroenterology

## 2018-10-24 ENCOUNTER — Encounter: Payer: Self-pay | Admitting: Gastroenterology

## 2018-10-27 ENCOUNTER — Telehealth: Payer: Self-pay

## 2018-10-27 NOTE — Telephone Encounter (Signed)
Pt left vm he states he needs to cancel his procedure because his insurance will not pay for it also his rx was not covered by insurnace

## 2018-10-27 NOTE — Telephone Encounter (Signed)
Patient requested to cancel colonoscopy due to insurance not covering procedure.  Since this procdedure was scheduled Friday 10/23 during office visit-No cancellation fee will be applied because he sent message soon as he found out.  Thanks [employer]

## 2018-10-28 ENCOUNTER — Telehealth: Payer: Self-pay

## 2018-10-28 ENCOUNTER — Ambulatory Visit: Admission: RE | Admit: 2018-10-28 | Payer: Medicaid Other | Source: Ambulatory Visit | Admitting: Gastroenterology

## 2018-10-28 ENCOUNTER — Encounter: Payer: Self-pay | Admitting: Family Medicine

## 2018-10-28 ENCOUNTER — Other Ambulatory Visit: Payer: Self-pay

## 2018-10-28 ENCOUNTER — Encounter: Admission: RE | Payer: Self-pay | Source: Ambulatory Visit

## 2018-10-28 DIAGNOSIS — R1030 Lower abdominal pain, unspecified: Secondary | ICD-10-CM

## 2018-10-28 SURGERY — COLONOSCOPY WITH PROPOFOL
Anesthesia: General

## 2018-10-28 NOTE — Telephone Encounter (Signed)
Patients caregiver contacted office regarding Jesus Guerrero colonoscopy.  He states that Jesus Guerrero is autistic and it doesn't surprise him that he sent the message regarding insurance and colonoscopy.  Jesus Guerrero has requested that we reschedule Jesus Guerrero colonoscopy.  I've rescheduled him for his colonoscopy on 11/04/18.  Thanks [employer]

## 2018-10-29 ENCOUNTER — Other Ambulatory Visit: Payer: Self-pay | Admitting: Family Medicine

## 2018-10-29 MED ORDER — TRIAMCINOLONE ACETONIDE 0.1 % EX CREA: 1.0000 "application " | TOPICAL_CREAM | Freq: Two times a day (BID) | CUTANEOUS | 0 refills | Status: DC

## 2018-10-30 ENCOUNTER — Encounter: Payer: Self-pay | Admitting: Family Medicine

## 2018-10-30 ENCOUNTER — Ambulatory Visit (INDEPENDENT_AMBULATORY_CARE_PROVIDER_SITE_OTHER): Payer: Medicaid Other | Admitting: Family Medicine

## 2018-10-30 VITALS — BP 109/66 | HR 83 | Temp 99.1°F | Ht 66.25 in | Wt 189.4 lb

## 2018-10-30 DIAGNOSIS — R21 Rash and other nonspecific skin eruption: Secondary | ICD-10-CM | POA: Diagnosis not present

## 2018-10-30 DIAGNOSIS — Z0000 Encounter for general adult medical examination without abnormal findings: Secondary | ICD-10-CM

## 2018-10-30 NOTE — Patient Instructions (Signed)
You have an appointment scheduled with Dr. Laurence Ferrari  Wednesday Novemeber 6nd, 2019 at 2:00PM at [facility]
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
## 2018-11-03 ENCOUNTER — Encounter: Payer: Self-pay | Admitting: *Deleted

## 2018-11-03 ENCOUNTER — Other Ambulatory Visit: Payer: Self-pay

## 2018-11-03 MED ORDER — NA SULFATE-K SULFATE-MG SULF 17.5-3.13-1.6 GM/177ML PO SOLN: 1.0000 | Freq: Once | ORAL | 0 refills | Status: AC

## 2018-11-04 ENCOUNTER — Encounter: Payer: Self-pay | Admitting: *Deleted

## 2018-11-04 ENCOUNTER — Ambulatory Visit
Admission: RE | Admit: 2018-11-04 | Discharge: 2018-11-04 | Disposition: A | Discharge status: Home / Self Care | Payer: Medicaid Other | Source: Ambulatory Visit | Attending: Gastroenterology | Admitting: Gastroenterology

## 2018-11-04 ENCOUNTER — Ambulatory Visit: Payer: Medicaid Other | Admitting: Anesthesiology

## 2018-11-04 ENCOUNTER — Encounter
Admission: RE | Disposition: A | Discharge status: Home / Self Care | Payer: Self-pay | Source: Ambulatory Visit | Attending: Gastroenterology

## 2018-11-04 DIAGNOSIS — Z791 Long term (current) use of non-steroidal anti-inflammatories (NSAID): Secondary | ICD-10-CM | POA: Insufficient documentation

## 2018-11-04 DIAGNOSIS — Z1211 Encounter for screening for malignant neoplasm of colon: Secondary | ICD-10-CM | POA: Insufficient documentation

## 2018-11-04 DIAGNOSIS — M109 Gout, unspecified: Secondary | ICD-10-CM | POA: Diagnosis not present

## 2018-11-04 DIAGNOSIS — Z86010 Personal history of colonic polyps: Secondary | ICD-10-CM | POA: Diagnosis not present

## 2018-11-04 DIAGNOSIS — E663 Overweight: Secondary | ICD-10-CM | POA: Insufficient documentation

## 2018-11-04 DIAGNOSIS — E7800 Pure hypercholesterolemia, unspecified: Secondary | ICD-10-CM | POA: Diagnosis not present

## 2018-11-04 DIAGNOSIS — Z79899 Other long term (current) drug therapy: Secondary | ICD-10-CM | POA: Diagnosis not present

## 2018-11-04 DIAGNOSIS — K635 Polyp of colon: Secondary | ICD-10-CM | POA: Diagnosis not present

## 2018-11-04 DIAGNOSIS — F840 Autistic disorder: Secondary | ICD-10-CM | POA: Diagnosis not present

## 2018-11-04 DIAGNOSIS — Z6832 Body mass index (BMI) 32.0-32.9, adult: Secondary | ICD-10-CM | POA: Diagnosis not present

## 2018-11-04 DIAGNOSIS — R1030 Lower abdominal pain, unspecified: Secondary | ICD-10-CM

## 2018-11-04 HISTORY — PX: COLONOSCOPY WITH PROPOFOL: SHX5780

## 2018-11-04 SURGERY — COLONOSCOPY WITH PROPOFOL
Anesthesia: General

## 2018-11-04 MED ORDER — PROPOFOL 10 MG/ML IV BOLUS
INTRAVENOUS | Status: DC | PRN
  Administered 2018-11-04: 40 mg via INTRAVENOUS
  Administered 2018-11-04: 20 mg via INTRAVENOUS

## 2018-11-04 MED ORDER — PROPOFOL 500 MG/50ML IV EMUL
INTRAVENOUS | Status: DC | PRN
  Administered 2018-11-04: 120 ug/kg/min via INTRAVENOUS

## 2018-11-04 MED ORDER — SODIUM CHLORIDE 0.9 % IV SOLN
INTRAVENOUS | Status: DC
  Administered 2018-11-04: 1000 mL via INTRAVENOUS

## 2018-11-04 MED ORDER — LIDOCAINE HCL (PF) 2 % IJ SOLN
INTRAMUSCULAR | Status: AC
  Filled 2018-11-04: qty 10

## 2018-11-04 MED ORDER — PROPOFOL 500 MG/50ML IV EMUL
INTRAVENOUS | Status: AC
  Filled 2018-11-04: qty 50

## 2018-11-04 NOTE — Anesthesia Post-op Follow-up Note (Signed)
Anesthesia QCDR form completed.        
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
## 2018-11-04 NOTE — Op Note (Signed)
[facility] Gastroenterology Patient Name: Jesus Guerrero Procedure Date: 11/04/2018 2:50 PM MRN: [id number] Account #: [id number] Date of Birth: 12-Aug-1971 Admit Type: Outpatient Age: 47 Room: [facility] ENDO ROOM 4 Gender: Male Note Status: Finalized Procedure:            Colonoscopy Indications:          High risk colon cancer surveillance: Personal history                        of colonic polyps, Last colonoscopy: March 2013 Providers:            Lin Landsman MD, MD Referring MD:         Valerie Roys (Referring MD) Medicines:            Monitored Anesthesia Care Complications:        No immediate complications. Estimated blood loss: None. Procedure:            Pre-Anesthesia Assessment:                       - Prior to the procedure, a History and Physical was                        performed, and patient medications and allergies were                        reviewed. The patient is competent. The risks and                        benefits of the procedure and the sedation options and                        risks were discussed with the patient. All questions                        were answered and informed consent was obtained.                        Patient identification and proposed procedure were                        verified by the physician, the nurse, the                        anesthesiologist, the anesthetist and the technician in                        the pre-procedure area in the procedure room in the                        endoscopy suite. Mental Status Examination: alert and                        oriented. Airway Examination: normal oropharyngeal                        airway and neck mobility. Respiratory Examination:                        clear to auscultation. CV Examination: normal.  Prophylactic Antibiotics: The patient does not require                        prophylactic antibiotics. Prior Anticoagulants: The                         patient has taken no previous anticoagulant or                        antiplatelet agents. ASA Grade Assessment: II - A                        patient with mild systemic disease. After reviewing the                        risks and benefits, the patient was deemed in                        satisfactory condition to undergo the procedure. The                        anesthesia plan was to use monitored anesthesia care                        (MAC). Immediately prior to administration of                        medications, the patient was re-assessed for adequacy                        to receive sedatives. The heart rate, respiratory rate,                        oxygen saturations, blood pressure, adequacy of                        pulmonary ventilation, and response to care were                        monitored throughout the procedure. The physical status                        of the patient was re-assessed after the procedure.                       After obtaining informed consent, the colonoscope was                        passed under direct vision. Throughout the procedure,                        the patient's blood pressure, pulse, and oxygen                        saturations were monitored continuously. The                        Colonoscope was introduced through the anus and  advanced to the the terminal ileum, with identification                        of the appendiceal orifice and IC valve. The                        colonoscopy was performed without difficulty. The                        patient tolerated the procedure well. The quality of                        the bowel preparation was evaluated using the BBPS                        [facility] Bowel Preparation Scale) with scores of: Right                        Colon = 3, Transverse Colon = 3 and Left Colon = 3                        (entire mucosa seen well with no residual  staining,                        small fragments of stool or opaque liquid). The total                        BBPS score equals 9. Findings:      The perianal and digital rectal examinations were normal. Pertinent       negatives include normal sphincter tone and no palpable rectal lesions.      The terminal ileum appeared normal.      The entire examined colon appeared normal.      The retroflexed view of the distal rectum and anal verge was normal and       showed no anal or rectal abnormalities. Impression:           - The examined portion of the ileum was normal.                       - The entire examined colon is normal.                       - The distal rectum and anal verge are normal on                        retroflexion view.                       - No specimens collected. Recommendation:       - Discharge patient to home (with escort).                       - Resume previous diet today.                       - Continue present medications.                       - Repeat colonoscopy in 10 years for surveillance. Procedure Code(s):    ---  Professional ---                       K2706, Colorectal cancer screening; colonoscopy on                        individual at high risk Diagnosis Code(s):    --- Professional ---                       Z86.010, Personal history of colonic polyps CPT copyright 2018 American [employer] Association. All rights reserved. The codes documented in this report are preliminary and upon coder review may  be revised to meet current compliance requirements. Dr. Ulyess Mort Lin Landsman MD, MD 11/04/2018 3:16:16 PM This report has been signed electronically. Number of Addenda: 0 Note Initiated On: 11/04/2018 2:50 PM Scope Withdrawal Time: 0 hours 9 minutes 47 seconds  Total Procedure Duration: 0 hours 13 minutes 48 seconds       [employer]

## 2018-11-04 NOTE — Anesthesia Postprocedure Evaluation (Signed)
Anesthesia Post Note  Patient: Jesus Guerrero  Procedure(s) Performed: COLONOSCOPY WITH PROPOFOL (N/A )  Patient location during evaluation: Endoscopy Anesthesia Type: General Level of consciousness: awake and alert Pain management: pain level controlled Vital Signs Assessment: post-procedure vital signs reviewed and stable Respiratory status: spontaneous breathing, nonlabored ventilation, respiratory function stable and patient connected to nasal cannula oxygen Cardiovascular status: blood pressure returned to baseline and stable Postop Assessment: no apparent nausea or vomiting Anesthetic complications: no     Last Vitals:  Vitals:   11/04/18 1529 11/04/18 1539  BP: 134/79   Pulse: 79 77  Resp: (!) 34 15  Temp:    SpO2: 100% 100%    Last Pain:  Vitals:   11/04/18 1539  PainSc: 0-No pain                 Shaneta Cervenka S

## 2018-11-04 NOTE — Transfer of Care (Signed)
Immediate Anesthesia Transfer of Care Note  Patient: Jesus Guerrero  Procedure(s) Performed: COLONOSCOPY WITH PROPOFOL (N/A )  Patient Location: PACU  Anesthesia Type:General  Level of Consciousness: sedated  Airway & Oxygen Therapy: Patient Spontanous Breathing and Patient connected to nasal cannula oxygen  Post-op Assessment: Report given to RN and Post -op Vital signs reviewed and stable  Post vital signs: Reviewed and stable  Last Vitals:  Vitals Value Taken Time  BP 101/64 11/04/2018  3:19 PM  Temp    Pulse 44 11/04/2018  3:19 PM  Resp 16 11/04/2018  3:19 PM  SpO2 85 % 11/04/2018  3:19 PM    Last Pain: There were no vitals filed for this visit.       Complications: No apparent anesthesia complications
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
## 2018-11-06 ENCOUNTER — Encounter: Payer: Self-pay | Admitting: Gastroenterology

## 2018-11-12 ENCOUNTER — Encounter: Payer: Self-pay | Admitting: Family Medicine

## 2018-11-14 ENCOUNTER — Other Ambulatory Visit: Payer: Self-pay | Admitting: Gastroenterology

## 2018-11-14 ENCOUNTER — Other Ambulatory Visit: Payer: Self-pay | Admitting: Family Medicine

## 2018-11-17 ENCOUNTER — Encounter: Payer: Self-pay | Admitting: Family Medicine

## 2018-11-22 ENCOUNTER — Other Ambulatory Visit: Payer: Self-pay | Admitting: Family Medicine

## 2018-11-24 ENCOUNTER — Encounter: Payer: Self-pay | Admitting: Family Medicine

## 2018-11-24 MED ORDER — MONTELUKAST SODIUM 10 MG PO TABS: 10.0000 mg | ORAL_TABLET | Freq: Every day | ORAL | 3 refills | Status: DC

## 2018-11-24 NOTE — Telephone Encounter (Signed)
Medication sent in 11/14/18. Will send patient a message to call pharmacy.

## 2018-11-27 ENCOUNTER — Encounter: Payer: Self-pay | Admitting: Family Medicine

## 2018-12-02 ENCOUNTER — Ambulatory Visit: Payer: Medicaid Other | Admitting: Gastroenterology

## 2018-12-15 ENCOUNTER — Encounter: Payer: Self-pay | Admitting: Family Medicine

## 2018-12-15 ENCOUNTER — Other Ambulatory Visit: Payer: Self-pay | Admitting: Family Medicine

## 2018-12-15 MED ORDER — ATORVASTATIN CALCIUM 20 MG PO TABS: 20.0000 mg | ORAL_TABLET | Freq: Every day | ORAL | 1 refills | Status: DC

## 2018-12-16 ENCOUNTER — Encounter: Payer: Self-pay | Admitting: Family Medicine

## 2018-12-18 ENCOUNTER — Encounter: Payer: Self-pay | Admitting: Family Medicine

## 2018-12-19 ENCOUNTER — Encounter: Payer: Self-pay | Admitting: Family Medicine

## 2018-12-19 ENCOUNTER — Ambulatory Visit (INDEPENDENT_AMBULATORY_CARE_PROVIDER_SITE_OTHER): Payer: Medicaid Other | Admitting: Family Medicine

## 2018-12-19 ENCOUNTER — Other Ambulatory Visit: Payer: Self-pay

## 2018-12-19 VITALS — BP 118/76 | HR 96 | Temp 98.6°F | Ht 64.0 in | Wt 183.0 lb

## 2018-12-19 DIAGNOSIS — J0100 Acute maxillary sinusitis, unspecified: Secondary | ICD-10-CM | POA: Diagnosis not present

## 2018-12-19 DIAGNOSIS — R509 Fever, unspecified: Secondary | ICD-10-CM | POA: Diagnosis not present

## 2018-12-19 LAB — VERITOR FLU A/B WAIVED
INFLUENZA B: NEGATIVE
Influenza A: NEGATIVE

## 2018-12-19 MED ORDER — PREDNISONE 50 MG PO TABS: 50.0000 mg | ORAL_TABLET | Freq: Every day | ORAL | 0 refills | Status: DC

## 2018-12-19 MED ORDER — AMOXICILLIN-POT CLAVULANATE 875-125 MG PO TABS: 1.0000 | ORAL_TABLET | Freq: Two times a day (BID) | ORAL | 0 refills | Status: DC

## 2018-12-19 NOTE — Progress Notes (Signed)
BP 118/76   Pulse 96   Temp 98.6 F (37 C) (Oral)   Ht 5\' 4"  (1.626 m)   Wt 183 lb (83 kg)   SpO2 98%   BMI 31.41 kg/m    Subjective:    Patient ID: Jesus Guerrero, male    DOB: Sep 15, 1971, 47 y.o.   MRN: [id number]  HPI: Jesus Guerrero is a 47 y.o. male  Chief Complaint  Patient presents with  . Cough    x 4 days/has tried OTC dayquil, theraflu  . Nasal Congestion  . Chills  . Fever   UPPER RESPIRATORY TRACT INFECTION Duration: 4 days, headache for about a month.  Worst symptom: coughing and congestion Fever: yes Cough: yes Shortness of breath: no Wheezing: no Chest pain: yes, with cough Chest tightness: yes Chest congestion: yes Nasal congestion: yes Runny nose: yes Post nasal drip: yes Sneezing: yes Sore throat: yes Swollen glands: no Sinus pressure: yes Headache: yes Face pain: no Toothache: no Ear pain: no  Ear pressure: no  Eyes red/itching:no Eye drainage/crusting: no  Vomiting: yes Rash: no Fatigue: yes Sick contacts: yes Strep contacts: no  Context: stable Recurrent sinusitis: no Relief with OTC cold/cough medications: no  Treatments attempted: theraflu and dayquil   Relevant past medical, surgical, family and social history reviewed and updated as indicated. Interim medical history since our last visit reviewed. Allergies and medications reviewed and updated.  Review of Systems  Constitutional: Positive for chills, diaphoresis, fatigue and fever. Negative for activity change, appetite change and unexpected weight change.  HENT: Positive for congestion, nosebleeds, postnasal drip, rhinorrhea, sinus pressure, sinus pain, sneezing and sore throat. Negative for dental problem, drooling, ear discharge, ear pain, facial swelling, hearing loss, mouth sores, tinnitus, trouble swallowing and voice change.   Eyes: Negative.   Respiratory: Positive for cough, chest tightness and shortness of breath. Negative for apnea, choking, wheezing and  stridor.   Cardiovascular: Positive for chest pain. Negative for palpitations and leg swelling.  Gastrointestinal: Negative.   Neurological: Positive for headaches.  Psychiatric/Behavioral: Negative.     Per HPI unless specifically indicated above     Objective:    BP 118/76   Pulse 96   Temp 98.6 F (37 C) (Oral)   Ht 5\' 4"  (1.626 m)   Wt 183 lb (83 kg)   SpO2 98%   BMI 31.41 kg/m   Wt Readings from Last 3 Encounters:  12/19/18 183 lb (83 kg)  11/04/18 189 lb (85.7 kg)  10/30/18 189 lb 6.4 oz (85.9 kg)    Physical Exam Vitals signs and nursing note reviewed.  Constitutional:      General: He is not in acute distress.    Appearance: Normal appearance. He is not ill-appearing, toxic-appearing or diaphoretic.  HENT:     Head: Normocephalic and atraumatic.     Right Ear: Tympanic membrane, ear canal and external ear normal. There is no impacted cerumen.     Left Ear: Tympanic membrane, ear canal and external ear normal. There is no impacted cerumen.     Nose: Congestion and rhinorrhea present.     Comments: Sinus tenderness over R maxillary sinus    Mouth/Throat:     Mouth: Mucous membranes are moist.     Pharynx: Oropharynx is clear. No oropharyngeal exudate or posterior oropharyngeal erythema.  Eyes:     General: No scleral icterus.       Right eye: No discharge.        Left eye:  No discharge.     Extraocular Movements: Extraocular movements intact.     Conjunctiva/sclera: Conjunctivae normal.     Pupils: Pupils are equal, round, and reactive to light.  Neck:     Musculoskeletal: Normal range of motion and neck supple. No neck rigidity or muscular tenderness.     Vascular: No carotid bruit.  Cardiovascular:     Rate and Rhythm: Normal rate and regular rhythm.     Pulses: Normal pulses.     Heart sounds: Normal heart sounds. No murmur. No friction rub. No gallop.   Pulmonary:     Effort: Pulmonary effort is normal. No respiratory distress.     Breath sounds:  Normal breath sounds. No stridor. No wheezing, rhonchi or rales.  Chest:     Chest wall: No tenderness.  Musculoskeletal: Normal range of motion.  Lymphadenopathy:     Cervical: Cervical adenopathy present.  Skin:    General: Skin is warm and dry.     Capillary Refill: Capillary refill takes less than 2 seconds.     Coloration: Skin is not jaundiced or pale.     Findings: No bruising, erythema, lesion or rash.  Neurological:     General: No focal deficit present.     Mental Status: He is alert and oriented to person, place, and time. Mental status is at baseline.     Cranial Nerves: No cranial nerve deficit.     Sensory: No sensory deficit.     Motor: No weakness.     Coordination: Coordination normal.     Gait: Gait normal.     Deep Tendon Reflexes: Reflexes normal.  Psychiatric:        Mood and Affect: Mood normal.        Behavior: Behavior normal.        Thought Content: Thought content normal.        Judgment: Judgment normal.     Results for orders placed or performed in visit on 09/09/18  Microscopic Examination  Result Value Ref Range   WBC, UA 0-5 0 - 5 /hpf   RBC, UA 0-2 0 - 2 /hpf   Epithelial Cells (non renal) 0-10 0 - 10 /hpf   Bacteria, UA None seen None seen/Few  UA/M w/rflx Culture, Routine  Result Value Ref Range   Specific Gravity, UA 1.025 1.005 - 1.030   pH, UA 5.0 5.0 - 7.5   Color, UA Yellow Yellow   Appearance Ur Hazy (A) Clear   Leukocytes, UA Negative Negative   Protein, UA Negative Negative/Trace   Glucose, UA Negative Negative   Ketones, UA Negative Negative   RBC, UA Trace (A) Negative   Bilirubin, UA Negative Negative   Urobilinogen, Ur 0.2 0.2 - 1.0 mg/dL   Nitrite, UA Negative Negative   Microscopic Examination See below:       Assessment & Plan:   Problem List Items Addressed This Visit    None    Visit Diagnoses    Acute non-recurrent maxillary sinusitis    -  Primary   Will treat with prednisone and augmentin. Call if not  getting better or getting worse.    Relevant Medications   predniSONE (DELTASONE) 50 MG tablet   amoxicillin-clavulanate (AUGMENTIN) 875-125 MG tablet   Fever, unspecified fever cause       Flu negative.    Relevant Orders   Veritor Flu A/B Waived       Follow up plan: Return if symptoms worsen or fail to improve.

## 2018-12-21 ENCOUNTER — Encounter: Payer: Self-pay | Admitting: Family Medicine

## 2018-12-22 ENCOUNTER — Encounter: Payer: Self-pay | Admitting: Family Medicine

## 2018-12-23 ENCOUNTER — Encounter: Payer: Self-pay | Admitting: Family Medicine

## 2018-12-24 ENCOUNTER — Encounter: Payer: Self-pay | Admitting: Family Medicine

## 2018-12-29 ENCOUNTER — Encounter: Payer: Self-pay | Admitting: Family Medicine

## 2019-01-11 ENCOUNTER — Encounter: Payer: Self-pay | Admitting: Family Medicine

## 2019-01-19 ENCOUNTER — Encounter: Payer: Self-pay | Admitting: Family Medicine

## 2019-01-19 ENCOUNTER — Other Ambulatory Visit: Payer: Self-pay | Admitting: Family Medicine

## 2019-01-19 MED ORDER — CETIRIZINE HCL 10 MG PO TABS: 10.0000 mg | ORAL_TABLET | Freq: Every day | ORAL | 2 refills | Status: DC

## 2019-01-29 ENCOUNTER — Ambulatory Visit: Payer: Medicaid Other | Admitting: Family Medicine

## 2019-01-30 ENCOUNTER — Ambulatory Visit: Payer: Medicaid Other | Admitting: Family Medicine

## 2019-02-02 ENCOUNTER — Encounter: Payer: Self-pay | Admitting: Family Medicine

## 2019-02-03 ENCOUNTER — Encounter: Payer: Self-pay | Admitting: Family Medicine

## 2019-02-04 NOTE — Progress Notes (Signed)
BP 116/73 (BP Location: Left Arm, Patient Position: Sitting, Cuff Size: Large)   Pulse 84   Temp 98.4 F (36.9 C)   Ht 5\' 4"  (1.626 m)   Wt 192 lb 6 oz (87.3 kg)   SpO2 95%   BMI 33.02 kg/m    Subjective:    Patient ID: Jesus Guerrero, male    DOB: 1971/06/30, 48 y.o.   MRN: [id number]  HPI: Jesus Guerrero is a 48 y.o. male  Chief Complaint  Patient presents with  . Hyperlipidemia  . Gout  . Rash   No gout flares since last visit. Tolerating medicine well. No concerns.   RASH Duration:  1 week  Location: face  Itching: yes Burning: no Redness: no Oozing: no Scaling: yes Blisters: no Painful: no Fevers: no Change in detergents/soaps/personal care products: no Recent illness: no Recent travel:no History of same: yes Context: stable Alleviating factors: hydrocortisone cream and lotion/moisturizer Treatments attempted:hydrocortisone cream, benadryl and lotion/moisturizer Shortness of breath: no  Throat/tongue swelling: no Myalgias/arthralgias: no  HYPERLIPIDEMIA Hyperlipidemia status: excellent compliance Satisfied with current treatment?  no Side effects:  no Medication compliance: excellent compliance Past cholesterol meds: atorvastatin Supplements: none Aspirin:  no The 10-year ASCVD risk score [name], et al., 2013) is: 2.2%   Values used to calculate the score:     Age: 39 years     Sex: Male     Is Non-Hispanic African American: No     Diabetic: No     Tobacco smoker: No     Systolic Blood Pressure: 785 mmHg     Is BP treated: No     HDL Cholesterol: 34 mg/dL     Total Cholesterol: 134 mg/dL Chest pain:  no Coronary artery disease:  no  Relevant past medical, surgical, family and social history reviewed and updated as indicated. Interim medical history since our last visit reviewed. Allergies and medications reviewed and updated.  Review of Systems  Constitutional: Negative.   Respiratory: Negative.   Cardiovascular: Negative.     Musculoskeletal: Negative.   Neurological: Negative.   Psychiatric/Behavioral: Negative.     Per HPI unless specifically indicated above     Objective:    BP 116/73 (BP Location: Left Arm, Patient Position: Sitting, Cuff Size: Large)   Pulse 84   Temp 98.4 F (36.9 C)   Ht 5\' 4"  (1.626 m)   Wt 192 lb 6 oz (87.3 kg)   SpO2 95%   BMI 33.02 kg/m   Wt Readings from Last 3 Encounters:  02/05/19 192 lb 6 oz (87.3 kg)  12/19/18 183 lb (83 kg)  11/04/18 189 lb (85.7 kg)    Physical Exam Vitals signs and nursing note reviewed.  Constitutional:      General: He is not in acute distress.    Appearance: Normal appearance. He is not ill-appearing, toxic-appearing or diaphoretic.  HENT:     Head: Normocephalic and atraumatic.     Right Ear: External ear normal.     Left Ear: External ear normal.     Nose: Nose normal.     Mouth/Throat:     Mouth: Mucous membranes are moist.     Pharynx: Oropharynx is clear.  Eyes:     General: No scleral icterus.       Right eye: No discharge.        Left eye: No discharge.     Extraocular Movements: Extraocular movements intact.     Conjunctiva/sclera: Conjunctivae normal.  Pupils: Pupils are equal, round, and reactive to light.  Neck:     Musculoskeletal: Normal range of motion and neck supple.  Cardiovascular:     Rate and Rhythm: Normal rate and regular rhythm.     Pulses: Normal pulses.     Heart sounds: Normal heart sounds. No murmur. No friction rub. No gallop.   Pulmonary:     Effort: Pulmonary effort is normal. No respiratory distress.     Breath sounds: Normal breath sounds. No stridor. No wheezing, rhonchi or rales.  Chest:     Chest wall: No tenderness.  Musculoskeletal: Normal range of motion.  Skin:    General: Skin is warm and dry.     Capillary Refill: Capillary refill takes less than 2 seconds.     Coloration: Skin is not jaundiced or pale.     Findings: Rash (small patch of erythematous dry skin on L chin) present.  No bruising, erythema or lesion.  Neurological:     General: No focal deficit present.     Mental Status: He is alert and oriented to person, place, and time. Mental status is at baseline.  Psychiatric:        Mood and Affect: Mood normal.        Behavior: Behavior normal.        Thought Content: Thought content normal.        Judgment: Judgment normal.     Results for orders placed or performed in visit on 12/19/18  Veritor Flu A/B Waived  Result Value Ref Range   Influenza A Negative Negative   Influenza B Negative Negative      Assessment & Plan:   Problem List Items Addressed This Visit      Musculoskeletal and Integument   Eczema    Continue medication. Continue to follow with dermatology. Call with any concerns.       Acute idiopathic gout involving toe of left foot    Stable. Rechecking levels today. Continue to monitor. Call with any concerns.      Relevant Orders   Comprehensive metabolic panel   Uric acid     Other   Hyperlipidemia - Primary    Rechecking levels today. Await results. Call with any concerns.       Relevant Orders   Comprehensive metabolic panel   Lipid Panel w/o Chol/HDL Ratio       Follow up plan: Return in about 3 months (around 05/06/2019).

## 2019-02-05 ENCOUNTER — Encounter: Payer: Self-pay | Admitting: Family Medicine

## 2019-02-05 ENCOUNTER — Ambulatory Visit: Payer: Medicaid Other | Admitting: Family Medicine

## 2019-02-05 VITALS — BP 116/73 | HR 84 | Temp 98.4°F | Ht 64.0 in | Wt 192.4 lb

## 2019-02-05 DIAGNOSIS — M10072 Idiopathic gout, left ankle and foot: Secondary | ICD-10-CM | POA: Diagnosis not present

## 2019-02-05 DIAGNOSIS — E782 Mixed hyperlipidemia: Secondary | ICD-10-CM

## 2019-02-05 DIAGNOSIS — L309 Dermatitis, unspecified: Secondary | ICD-10-CM | POA: Diagnosis not present

## 2019-02-05 NOTE — Assessment & Plan Note (Signed)
Continue medication. Continue to follow with dermatology. Call with any concerns.

## 2019-02-05 NOTE — Assessment & Plan Note (Signed)
Stable. Rechecking levels today. Continue to monitor. Call with any concerns.

## 2019-02-05 NOTE — Assessment & Plan Note (Signed)
Rechecking levels today. Await results. Call with any concerns.  

## 2019-02-06 ENCOUNTER — Encounter: Payer: Self-pay | Admitting: Family Medicine

## 2019-02-06 LAB — LIPID PANEL W/O CHOL/HDL RATIO
CHOLESTEROL TOTAL: 160 mg/dL (ref 100–199)
HDL: 42 mg/dL (ref >39)
LDL CALC: 78 mg/dL (ref 0–99)
Triglycerides: 201 mg/dL — ABNORMAL HIGH (ref 0–149)
VLDL Cholesterol Cal: 40 mg/dL (ref 5–40)

## 2019-02-06 LAB — COMPREHENSIVE METABOLIC PANEL
ALBUMIN: 4.4 g/dL (ref 4.0–5.0)
ALK PHOS: 98 IU/L (ref 39–117)
ALT: 28 IU/L (ref 0–44)
AST: 23 IU/L (ref 0–40)
Albumin/Globulin Ratio: 1.6 (ref 1.2–2.2)
BILIRUBIN TOTAL: 0.8 mg/dL (ref 0.0–1.2)
BUN / CREAT RATIO: 19 (ref 9–20)
BUN: 21 mg/dL (ref 6–24)
CHLORIDE: 99 mmol/L (ref 96–106)
CO2: 21 mmol/L (ref 20–29)
CREATININE: 1.11 mg/dL (ref 0.76–1.27)
Calcium: 9.8 mg/dL (ref 8.7–10.2)
GFR calc Af Amer: 90 mL/min/{1.73_m2} (ref >59)
GFR calc non Af Amer: 78 mL/min/{1.73_m2} (ref >59)
GLUCOSE: 99 mg/dL (ref 65–99)
Globulin, Total: 2.8 g/dL (ref 1.5–4.5)
Potassium: 4.6 mmol/L (ref 3.5–5.2)
Sodium: 138 mmol/L (ref 134–144)
TOTAL PROTEIN: 7.2 g/dL (ref 6.0–8.5)

## 2019-02-06 LAB — URIC ACID: Uric Acid: 7.9 mg/dL (ref 3.7–8.6)

## 2019-02-25 ENCOUNTER — Encounter: Payer: Self-pay | Admitting: Family Medicine

## 2019-03-01 ENCOUNTER — Encounter: Payer: Self-pay | Admitting: Family Medicine

## 2019-03-02 ENCOUNTER — Encounter: Payer: Self-pay | Admitting: Family Medicine

## 2019-03-02 ENCOUNTER — Other Ambulatory Visit: Payer: Self-pay

## 2019-03-02 ENCOUNTER — Ambulatory Visit: Payer: Medicaid Other | Admitting: Nurse Practitioner

## 2019-03-02 ENCOUNTER — Ambulatory Visit: Payer: Self-pay | Admitting: *Deleted

## 2019-03-02 ENCOUNTER — Encounter: Payer: Self-pay | Admitting: Nurse Practitioner

## 2019-03-02 VITALS — BP 134/80 | HR 90 | Temp 98.4°F | Ht 64.0 in | Wt 187.0 lb

## 2019-03-02 DIAGNOSIS — J101 Influenza due to other identified influenza virus with other respiratory manifestations: Secondary | ICD-10-CM

## 2019-03-02 LAB — VERITOR FLU A/B WAIVED
INFLUENZA A: POSITIVE — AB
INFLUENZA B: NEGATIVE

## 2019-03-02 MED ORDER — OSELTAMIVIR PHOSPHATE 75 MG PO CAPS: 75.0000 mg | ORAL_CAPSULE | Freq: Two times a day (BID) | ORAL | 0 refills | Status: AC

## 2019-03-02 NOTE — Telephone Encounter (Signed)
Message from Luciana Axe sent at 03/02/2019 4:22 PM EST   Patient is calling for advise regarding oseltamivir (TAMIFLU) 75 MG capsule [[id number]] He can not swallow capsules. Please advise.   Called patient back and told him per reference he can open the capsules and mixed the medication in chocolate syrup, caramel topping, corn syrup or brown sugar mixed with a little water. Pt voiced understanding and wanted this e-mailed to him.  Options sent to him.

## 2019-03-02 NOTE — Progress Notes (Signed)
BP 134/80   Pulse 90   Temp 98.4 F (36.9 C) (Oral)   Ht 5\' 4"  (1.626 m)   Wt 187 lb (84.8 kg)   SpO2 97%   BMI 32.10 kg/m    Subjective:    Patient ID: Jesus Guerrero, male    DOB: 1971-11-04, 48 y.o.   MRN: [id number]  HPI: Jesus Guerrero is a 48 y.o. male  Chief Complaint  Patient presents with  . Cough    chest congestion ongoing since last Friday. has tried OTC medication. not helping  . Nasal Congestion  . Chills  . Generalized Body Aches   UPPER RESPIRATORY TRACT INFECTION Symptoms started on Friday evening.  Started with cough and chills. Worst symptom: cough and chills Fever: yes Cough: yes Shortness of breath: no Wheezing: no Chest pain: no Chest tightness: yes Chest congestion: yes Nasal congestion: yes Runny nose: yes Post nasal drip: yes Sneezing: yes Sore throat: no Swollen glands: no Sinus pressure: no Headache: yes Face pain: no Toothache: no Ear pain: yes bilateral Ear pressure: yes bilateral Eyes red/itching:no Eye drainage/crusting: no  Vomiting: no Rash: no Fatigue: yes Sick contacts: yes, room mates with the flu Strep contacts: no  Context: fluctuating Recurrent sinusitis: no Relief with OTC cold/cough medications: no  Treatments attempted: cold/sinus   Relevant past medical, surgical, family and social history reviewed and updated as indicated. Interim medical history since our last visit reviewed. Allergies and medications reviewed and updated.  Review of Systems  Constitutional: Positive for fatigue and fever. Negative for activity change and diaphoresis.  HENT: Positive for congestion, ear pain, postnasal drip, rhinorrhea, sinus pressure and sneezing. Negative for ear discharge, sinus pain, sore throat and voice change.   Eyes: Negative for pain, discharge, redness and visual disturbance.  Respiratory: Positive for cough. Negative for chest tightness, shortness of breath and wheezing.   Cardiovascular: Negative for  chest pain, palpitations and leg swelling.  Gastrointestinal: Negative for abdominal distention, abdominal pain, constipation, diarrhea, nausea and vomiting.  Endocrine: Negative for cold intolerance, polydipsia, polyphagia and polyuria.  Genitourinary: Negative for difficulty urinating.  Musculoskeletal: Positive for myalgias. Negative for back pain and neck pain.  Skin: Negative.   Allergic/Immunologic: Negative.   Neurological: Negative for dizziness, syncope, numbness and headaches.  Hematological: Negative.   Psychiatric/Behavioral: Negative for behavioral problems.    Per HPI unless specifically indicated above     Objective:    BP 134/80   Pulse 90   Temp 98.4 F (36.9 C) (Oral)   Ht 5\' 4"  (1.626 m)   Wt 187 lb (84.8 kg)   SpO2 97%   BMI 32.10 kg/m   Wt Readings from Last 3 Encounters:  03/02/19 187 lb (84.8 kg)  02/05/19 192 lb 6 oz (87.3 kg)  12/19/18 183 lb (83 kg)    Physical Exam Vitals signs and nursing note reviewed.  Constitutional:      General: He is awake.     Appearance: He is well-developed. He is ill-appearing.  HENT:     Head: Normocephalic and atraumatic.     Right Ear: Hearing, ear canal and external ear normal. No drainage. A middle ear effusion is present.     Left Ear: Hearing, ear canal and external ear normal. No drainage. A middle ear effusion is present.     Nose: Mucosal edema and rhinorrhea present. Rhinorrhea is clear.     Right Sinus: No maxillary sinus tenderness or frontal sinus tenderness.     Left  Sinus: No maxillary sinus tenderness or frontal sinus tenderness.     Mouth/Throat:     Mouth: Mucous membranes are moist.     Pharynx: Uvula midline. Posterior oropharyngeal erythema (mild with cobblestoning) present. No pharyngeal swelling or oropharyngeal exudate.     Tonsils: Swelling: 0 on the right. 0 on the left.  Eyes:     General: Lids are normal.        Right eye: No discharge.        Left eye: No discharge.      Conjunctiva/sclera: Conjunctivae normal.     Pupils: Pupils are equal, round, and reactive to light.  Neck:     Musculoskeletal: Normal range of motion and neck supple.     Thyroid: No thyromegaly.     Vascular: No carotid bruit or JVD.     Trachea: Trachea normal.  Cardiovascular:     Rate and Rhythm: Normal rate and regular rhythm.     Heart sounds: Normal heart sounds, S1 normal and S2 normal. No murmur. No gallop.   Pulmonary:     Effort: Pulmonary effort is normal.     Breath sounds: Normal breath sounds.     Comments: Clear throughout.  Intermittent nonproductive cough. Abdominal:     General: Bowel sounds are normal.     Palpations: Abdomen is soft. There is no hepatomegaly or splenomegaly.  Musculoskeletal: Normal range of motion.     Right lower leg: No edema.     Left lower leg: No edema.  Lymphadenopathy:     Head:     Right side of head: No submental, submandibular, tonsillar or preauricular adenopathy.     Left side of head: No submental, submandibular, tonsillar or preauricular adenopathy.     Cervical: No cervical adenopathy.  Skin:    General: Skin is warm and dry.     Capillary Refill: Capillary refill takes less than 2 seconds.     Findings: No rash.  Neurological:     Mental Status: He is alert and oriented to person, place, and time.     Deep Tendon Reflexes: Reflexes are normal and symmetric.  Psychiatric:        Mood and Affect: Mood normal.        Behavior: Behavior normal. Behavior is cooperative.        Thought Content: Thought content normal.        Judgment: Judgment normal.     Results for orders placed or performed in visit on 02/05/19  Comprehensive metabolic panel  Result Value Ref Range   Glucose 99 65 - 99 mg/dL   BUN 21 6 - 24 mg/dL   Creatinine, Ser 1.11 0.76 - 1.27 mg/dL   GFR calc non Af Amer 78 >59 mL/min/1.73   GFR calc Af Amer 90 >59 mL/min/1.73   BUN/Creatinine Ratio 19 9 - 20   Sodium 138 134 - 144 mmol/L   Potassium 4.6 3.5  - 5.2 mmol/L   Chloride 99 96 - 106 mmol/L   CO2 21 20 - 29 mmol/L   Calcium 9.8 8.7 - 10.2 mg/dL   Total Protein 7.2 6.0 - 8.5 g/dL   Albumin 4.4 4.0 - 5.0 g/dL   Globulin, Total 2.8 1.5 - 4.5 g/dL   Albumin/Globulin Ratio 1.6 1.2 - 2.2   Bilirubin Total 0.8 0.0 - 1.2 mg/dL   Alkaline Phosphatase 98 39 - 117 IU/L   AST 23 0 - 40 IU/L   ALT 28 0 - 44 IU/L  Lipid Panel w/o Chol/HDL Ratio  Result Value Ref Range   Cholesterol, Total 160 100 - 199 mg/dL   Triglycerides 201 (H) 0 - 149 mg/dL   HDL 42 >39 mg/dL   VLDL Cholesterol Cal 40 5 - 40 mg/dL   LDL Calculated 78 0 - 99 mg/dL  Uric acid  Result Value Ref Range   Uric Acid 7.9 3.7 - 8.6 mg/dL      Assessment & Plan:   Problem List Items Addressed This Visit      Respiratory   Influenza A - Primary    Positive for influenza A.  Script for Tamiflu sent.  Recommend rest and plenty for fluids at home.  May take Mucinex for cough and Tylenol/Motrin for fever/chills.  Return to office for worsening or continued symptoms.      Relevant Medications   oseltamivir (TAMIFLU) 75 MG capsule   Other Relevant Orders   Veritor Flu A/B Waived       Follow up plan: Return if symptoms worsen or fail to improve.

## 2019-03-02 NOTE — Telephone Encounter (Signed)
Spoke to him via phone an relayed the MyChart message I sent

## 2019-03-02 NOTE — Assessment & Plan Note (Signed)
Positive for influenza A.  Script for Tamiflu sent.  Recommend rest and plenty for fluids at home.  May take Mucinex for cough and Tylenol/Motrin for fever/chills.  Return to office for worsening or continued symptoms.

## 2019-03-02 NOTE — Patient Instructions (Signed)

## 2019-03-07 ENCOUNTER — Encounter: Payer: Self-pay | Admitting: Family Medicine

## 2019-03-15 ENCOUNTER — Encounter: Payer: Self-pay | Admitting: Family Medicine

## 2019-04-08 ENCOUNTER — Encounter: Payer: Self-pay | Admitting: Family Medicine

## 2019-04-10 ENCOUNTER — Encounter: Payer: Self-pay | Admitting: Family Medicine

## 2019-04-10 NOTE — Telephone Encounter (Signed)
Called LVM for patient to schedule virtual visit with Dr Wynetta Emery

## 2019-04-13 ENCOUNTER — Encounter: Payer: Self-pay | Admitting: Family Medicine

## 2019-04-13 ENCOUNTER — Ambulatory Visit: Payer: Medicaid Other | Admitting: Family Medicine

## 2019-04-15 ENCOUNTER — Encounter: Payer: Self-pay | Admitting: Family Medicine

## 2019-04-16 NOTE — Telephone Encounter (Signed)
LVM to schedule appointment.

## 2019-04-16 NOTE — Telephone Encounter (Signed)
LVM for patient to schedule video visit   Just FYI

## 2019-04-16 NOTE — Telephone Encounter (Signed)
Dr.Johnson, do you want me to see about getting him scheduled for a virtual visit?

## 2019-04-17 NOTE — Telephone Encounter (Signed)
LVM xs 3 to schedule virtual appointment. No answer

## 2019-04-22 ENCOUNTER — Encounter: Payer: Self-pay | Admitting: Family Medicine

## 2019-04-27 ENCOUNTER — Encounter: Payer: Self-pay | Admitting: Family Medicine

## 2019-04-29 ENCOUNTER — Encounter: Payer: Self-pay | Admitting: Family Medicine

## 2019-04-30 ENCOUNTER — Encounter: Payer: Self-pay | Admitting: Family Medicine

## 2019-05-01 ENCOUNTER — Encounter: Payer: Self-pay | Admitting: Family Medicine

## 2019-05-01 NOTE — Telephone Encounter (Signed)
Called pt no answer. Called [place] and he stated that he will let the pt know to call us to schedule an appt.

## 2019-05-01 NOTE — Telephone Encounter (Signed)
Called and left detailed message about calling to schedule an appointment on patients vm for ongoing sinus issues. DPR was checked.

## 2019-05-03 ENCOUNTER — Encounter: Payer: Self-pay | Admitting: Family Medicine

## 2019-05-04 ENCOUNTER — Encounter: Payer: Self-pay | Admitting: Family Medicine

## 2019-05-05 ENCOUNTER — Encounter: Payer: Self-pay | Admitting: Family Medicine

## 2019-05-06 ENCOUNTER — Encounter: Payer: Self-pay | Admitting: Family Medicine

## 2019-05-07 ENCOUNTER — Ambulatory Visit: Payer: Medicaid Other | Admitting: Family Medicine

## 2019-05-14 ENCOUNTER — Encounter: Payer: Self-pay | Admitting: Family Medicine

## 2019-05-20 ENCOUNTER — Encounter: Payer: Self-pay | Admitting: Family Medicine

## 2019-05-24 ENCOUNTER — Encounter: Payer: Self-pay | Admitting: Family Medicine

## 2019-05-26 ENCOUNTER — Encounter: Payer: Self-pay | Admitting: Family Medicine

## 2019-05-26 NOTE — Telephone Encounter (Signed)
Lab Results  Component Value Date   CHOL 160 02/05/2019   HDL 42 02/05/2019   LDLCALC 78 02/05/2019   TRIG 201 (H) 02/05/2019    Will you be repeating at [place]?

## 2019-05-27 ENCOUNTER — Encounter: Payer: Self-pay | Admitting: Family Medicine

## 2019-05-29 ENCOUNTER — Ambulatory Visit: Payer: Medicaid Other | Admitting: Family Medicine

## 2019-05-29 ENCOUNTER — Other Ambulatory Visit: Payer: Self-pay

## 2019-05-29 ENCOUNTER — Encounter: Payer: Self-pay | Admitting: Family Medicine

## 2019-05-29 VITALS — BP 129/77 | HR 85 | Temp 98.2°F | Ht 64.0 in | Wt 193.0 lb

## 2019-05-29 DIAGNOSIS — M25512 Pain in left shoulder: Secondary | ICD-10-CM

## 2019-05-29 DIAGNOSIS — M10072 Idiopathic gout, left ankle and foot: Secondary | ICD-10-CM | POA: Diagnosis not present

## 2019-05-29 DIAGNOSIS — E782 Mixed hyperlipidemia: Secondary | ICD-10-CM | POA: Diagnosis not present

## 2019-05-29 DIAGNOSIS — L309 Dermatitis, unspecified: Secondary | ICD-10-CM

## 2019-05-29 NOTE — Assessment & Plan Note (Signed)
Feeling well. No concerns. Not due for recheck on labs at this time. Call with any concerns. Continue to monitor.

## 2019-05-29 NOTE — Patient Instructions (Signed)
Shoulder Sprain  A shoulder sprain is a partial or complete tear in one of the tough, fiber-like tissues (ligaments) in the shoulder. The ligaments in the shoulder help to hold the shoulder in place. What are the causes? This condition may be caused by:  A fall.  A hit to the shoulder.  A twist of the arm. What increases the risk? You are more likely to develop this condition if you:  Play sports.  Have problems with balance or coordination. What are the signs or symptoms? Symptoms of this condition include:  Pain when moving the shoulder.  Limited ability to move the shoulder.  Swelling and tenderness on top of the shoulder.  Warmth in the shoulder.  A change in the shape of the shoulder.  Redness or bruising on the shoulder. How is this diagnosed? This condition is diagnosed with:  A physical exam. During the exam, you may be asked to do simple exercises with your shoulder.  Imaging tests such as X-rays, MRI, or a CT scan. These tests can show how severe the sprain is. How is this treated? This condition may be treated with:  Rest.  Pain medicine.  Ice.  A sling or brace. This is used to keep the arm still while the shoulder is healing.  Physical therapy or rehabilitation exercises. These help to improve the range of motion and strength of the shoulder.  Surgery (rare). Surgery may be needed if the sprain caused a joint to become unstable. Surgery may also be needed to reduce pain. Some people may develop ongoing shoulder pain or lose some range of motion in the shoulder. However, most people do not develop long-term problems. Follow these instructions at home:  If you have a sling or brace:  Wear the sling or brace as told by your health care provider. Remove it only as told by your health care provider.  Loosen the sling or brace if your fingers tingle, become numb, or turn cold and blue.  Keep the sling or brace clean.  If the sling or brace is not  waterproof: ? Do not let it get wet. ? Cover it with a watertight covering when you take a bath or shower. Activity  Rest your shoulder.  Move your arm only as much as told by your health care provider, but move your hand and fingers often to prevent stiffness and swelling.  Return to your normal activities as told by your health care provider. Ask your health care provider what activities are safe for you.  Ask your health care provider when it is safe for you to drive if you have a sling or brace on your shoulder.  If you were shown how to do any exercises, do them as told by your health care provider. General instructions  If directed, put ice on the affected area. ? Put ice in a plastic bag. ? Place a towel between your skin and the bag. ? Leave the ice on for 20 minutes, 2-3 times a day.  Take over-the-counter and prescription medicines only as told by your health care provider.  Do not use any products that contain nicotine or tobacco, such as cigarettes, e-cigarettes, and chewing tobacco. These can delay healing. If you need help quitting, ask your health care provider.  Keep all follow-up visits as told by your health care provider. This is important. Contact a health care provider if:  Your pain gets worse.  Your pain is not relieved with medicines.  You have increased  redness or swelling. Get help right away if:  You have a fever.  You cannot move your arm or shoulder.  You develop severe numbness or tingling in your arm, hand, or fingers.  Your arm, hand, or fingers feel cold and turn blue, white, or gray. Summary  A shoulder sprain is a partial or complete tear in one of the tough, fiber-like tissues (ligaments) in the shoulder.  This condition may be caused by a fall, a hit to the shoulder, or a twist of the arm.  Treatment usually includes rest, ice, and pain medicine as needed.  If you have a sling or brace, wear it as told by your health care  provider. Remove it only as told by your health care provider. This information is not intended to replace advice given to you by your health care provider. Make sure you discuss any questions you have with your health care provider. Document Released: 05/05/2009 Document Revised: 05/23/2018 Document Reviewed: 05/23/2018 Elsevier Interactive Patient Education  2019 [place].  Shoulder Impingement Syndrome Rehab Ask your health care provider which exercises are safe for you. Do exercises exactly as told by your health care provider and adjust them as directed. It is normal to feel mild stretching, pulling, tightness, or discomfort as you do these exercises, but you should stop right away if you feel sudden pain or your pain gets worse.Do not begin these exercises until told by your health care provider. Stretching and range of motion exercise This exercise warms up your muscles and joints and improves the movement and flexibility of your shoulder. This exercise also helps to relieve pain and stiffness. Exercise A: Passive horizontal adduction  1. Sit or stand and pull your left / right elbow across your chest, toward your other shoulder. Stop when you feel a gentle stretch in the back of your shoulder and upper arm. ? Keep your arm at shoulder height. ? Keep your arm as close to your body as you comfortably can. 2. Hold for __________ seconds. 3. Slowly return to the starting position. Repeat __________ times. Complete this exercise __________ times a day. Strengthening exercises These exercises build strength and endurance in your shoulder. Endurance is the ability to use your muscles for a long time, even after they get tired. Exercise B: External rotation, isometric 1. Stand or sit in a doorway, facing the door frame. 2. Bend your left / right elbow and place the back of your wrist against the door frame. Only your wrist should be touching the frame. Keep your upper arm at your side. 3.  Gently press your wrist against the door frame, as if you are trying to push your arm away from your abdomen. ? Avoid shrugging your shoulder while you press your hand against the door frame. Keep your shoulder blade tucked down toward the middle of your back. 4. Hold for __________ seconds. 5. Slowly release the tension, and relax your muscles completely before you do the exercise again. Repeat __________ times. Complete this exercise __________ times a day. Exercise C: Internal rotation, isometric  1. Stand or sit in a doorway, facing the door frame. 2. Bend your left / right elbow and place the inside of your wrist against the door frame. Only your wrist should be touching the frame. Keep your upper arm at your side. 3. Gently press your wrist against the door frame, as if you are trying to push your arm toward your abdomen. ? Avoid shrugging your shoulder while you press your  hand against the door frame. Keep your shoulder blade tucked down toward the middle of your back. 4. Hold for __________ seconds. 5. Slowly release the tension, and relax your muscles completely before you do the exercise again. Repeat __________ times. Complete this exercise __________ times a day. Exercise D: Scapular protraction, supine  1. Lie on your back on a firm surface. Hold a __________ weight in your left / right hand. 2. Raise your left / right arm straight into the air so your hand is directly above your shoulder joint. 3. Push the weight into the air so your shoulder lifts off of the surface that you are lying on. Do not move your head, neck, or back. 4. Hold for __________ seconds. 5. Slowly return to the starting position. Let your muscles relax completely before you repeat this exercise. Repeat __________ times. Complete this exercise __________ times a day. Exercise E: Scapular retraction  1. Sit in a stable chair without armrests, or stand. 2. Secure an exercise band to a stable object in front of  you so the band is at shoulder height. 3. Hold one end of the exercise band in each hand. Your palms should face down. 4. Squeeze your shoulder blades together and move your elbows slightly behind you. Do not shrug your shoulders while you do this. 5. Hold for __________ seconds. 6. Slowly return to the starting position. Repeat __________ times. Complete this exercise __________ times a day. Exercise F: Shoulder extension  1. Sit in a stable chair without armrests, or stand. 2. Secure an exercise band to a stable object in front of you where the band is above shoulder height. 3. Hold one end of the exercise band in each hand. 4. Straighten your elbows and lift your hands up to shoulder height. 5. Squeeze your shoulder blades together and pull your hands down to the sides of your thighs. Stop when your hands are straight down by your sides. Do not let your hands go behind your body. 6. Hold for __________ seconds. 7. Slowly return to the starting position. Repeat __________ times. Complete this exercise __________ times a day. This information is not intended to replace advice given to you by your health care provider. Make sure you discuss any questions you have with your health care provider. Document Released: 12/17/2005 Document Revised: 08/23/2016 Document Reviewed: 11/19/2015 Elsevier Interactive Patient Education  2019 [employer].

## 2019-05-29 NOTE — Progress Notes (Signed)
BP 129/77   Pulse 85   Temp 98.2 F (36.8 C) (Oral)   Ht 5\' 4"  (1.626 m)   Wt 193 lb (87.5 kg)   SpO2 96%   BMI 33.13 kg/m    Subjective:    Patient ID: Jesus Guerrero, male    DOB: 01/14/1971, 48 y.o.   MRN: [id number]  HPI: Jesus Guerrero is a 48 y.o. male  Chief Complaint  Patient presents with  . Hyperlipidemia    44m f/u  . Gout  . Eczema   Has not been following up with dermatology, hurting on the skin, not in his head. He continues with some dermatitis. States that he has not called them because he has been too busy. He also states that he is out of some of the medicine that they gave him.   Started with L shoulder pain 2 days ago. Aching. Hurts to move the shoulder. Did not have any injury. Unsure what happened. No numbness or tingling. No radiation of the pain. No other concerns or complaints at this time.  Has had no gout flares. Tolerating his medicine well. Feeling well. No other concerns or complaints today.  HYPERLIPIDEMIA Hyperlipidemia status: excellent compliance Satisfied with current treatment?  yes Side effects:  no Medication compliance: excellent compliance Past cholesterol meds: atorvastatin Supplements: none Aspirin:  no The 10-year ASCVD risk score [name], et al., 2013) is: 2.8%   Values used to calculate the score:     Age: 19 years     Sex: Male     Is Non-Hispanic African American: No     Diabetic: No     Tobacco smoker: No     Systolic Blood Pressure: 834 mmHg     Is BP treated: No     HDL Cholesterol: 42 mg/dL     Total Cholesterol: 160 mg/dL Chest pain:  no Coronary artery disease:  no   Relevant past medical, surgical, family and social history reviewed and updated as indicated. Interim medical history since our last visit reviewed. Allergies and medications reviewed and updated.  Review of Systems  Constitutional: Negative.   Respiratory: Negative.   Cardiovascular: Negative.   Musculoskeletal: Positive for  arthralgias and myalgias. Negative for back pain, gait problem, joint swelling, neck pain and neck stiffness.  Skin: Positive for rash. Negative for color change, pallor and wound.  Psychiatric/Behavioral: Negative.     Per HPI unless specifically indicated above     Objective:    BP 129/77   Pulse 85   Temp 98.2 F (36.8 C) (Oral)   Ht 5\' 4"  (1.626 m)   Wt 193 lb (87.5 kg)   SpO2 96%   BMI 33.13 kg/m   Wt Readings from Last 3 Encounters:  05/29/19 193 lb (87.5 kg)  03/02/19 187 lb (84.8 kg)  02/05/19 192 lb 6 oz (87.3 kg)    Physical Exam Vitals signs and nursing note reviewed.  Constitutional:      General: He is not in acute distress.    Appearance: Normal appearance. He is not ill-appearing, toxic-appearing or diaphoretic.  HENT:     Head: Normocephalic and atraumatic.     Right Ear: External ear normal.     Left Ear: External ear normal.     Nose: Nose normal.     Mouth/Throat:     Mouth: Mucous membranes are moist.     Pharynx: Oropharynx is clear.  Eyes:     General: No scleral icterus.  Right eye: No discharge.        Left eye: No discharge.     Extraocular Movements: Extraocular movements intact.     Conjunctiva/sclera: Conjunctivae normal.     Pupils: Pupils are equal, round, and reactive to light.  Neck:     Musculoskeletal: Normal range of motion and neck supple.  Cardiovascular:     Rate and Rhythm: Normal rate and regular rhythm.     Pulses: Normal pulses.     Heart sounds: Normal heart sounds. No murmur. No friction rub. No gallop.   Pulmonary:     Effort: Pulmonary effort is normal. No respiratory distress.     Breath sounds: Normal breath sounds. No stridor. No wheezing, rhonchi or rales.  Chest:     Chest wall: No tenderness.  Musculoskeletal: Normal range of motion.  Skin:    General: Skin is warm and dry.     Capillary Refill: Capillary refill takes less than 2 seconds.     Coloration: Skin is not jaundiced or pale.     Findings: No  bruising, erythema, lesion or rash.  Neurological:     General: No focal deficit present.     Mental Status: He is alert and oriented to person, place, and time. Mental status is at baseline.  Psychiatric:        Mood and Affect: Mood normal.        Behavior: Behavior normal.        Thought Content: Thought content normal.        Judgment: Judgment normal.     Results for orders placed or performed in visit on 03/02/19  Veritor Flu A/B Waived  Result Value Ref Range   Influenza A Positive (A) Negative   Influenza B Negative Negative      Assessment & Plan:   Problem List Items Addressed This Visit      Musculoskeletal and Integument   Eczema    Having pain in his forehead from his eczema. Will get him back into see his dermatologist. No pain in his head, only in his skin.      Acute idiopathic gout involving toe of left foot    No flares. Feeling well. No concerns. Not due for recheck on labs at this time. Call with any concerns. Continue to monitor.        Other   Hyperlipidemia - Primary    Feeling well. No concerns. Not due for recheck on labs at this time. Call with any concerns. Continue to monitor.       Other Visit Diagnoses    Acute pain of left shoulder       Stretches given. Continue meloxicam. Call with any concerns. Continue to monitor.        Follow up plan: Return in about 3 months (around 08/29/2019) for follow up with fasting labs.

## 2019-05-29 NOTE — Assessment & Plan Note (Signed)
Having pain in his forehead from his eczema. Will get him back into see his dermatologist. No pain in his head, only in his skin.

## 2019-05-29 NOTE — Assessment & Plan Note (Signed)
No flares. Feeling well. No concerns. Not due for recheck on labs at this time. Call with any concerns. Continue to monitor.

## 2019-06-06 ENCOUNTER — Encounter: Payer: Self-pay | Admitting: Family Medicine

## 2019-06-07 ENCOUNTER — Encounter: Payer: Self-pay | Admitting: Family Medicine

## 2019-06-08 ENCOUNTER — Encounter: Payer: Self-pay | Admitting: Family Medicine

## 2019-06-09 ENCOUNTER — Encounter: Payer: Self-pay | Admitting: Family Medicine

## 2019-06-09 ENCOUNTER — Other Ambulatory Visit: Payer: Self-pay

## 2019-06-09 ENCOUNTER — Ambulatory Visit: Payer: Medicaid Other | Admitting: Family Medicine

## 2019-06-09 VITALS — BP 142/82 | HR 83 | Temp 98.3°F

## 2019-06-09 DIAGNOSIS — M25471 Effusion, right ankle: Secondary | ICD-10-CM

## 2019-06-09 DIAGNOSIS — M10071 Idiopathic gout, right ankle and foot: Secondary | ICD-10-CM | POA: Diagnosis not present

## 2019-06-09 NOTE — Progress Notes (Signed)
BP (!) 142/82   Pulse 83   Temp 98.3 F (36.8 C) (Oral)   SpO2 97%    Subjective:    Patient ID: Jesus Guerrero, male    DOB: 01/26/1971, 48 y.o.   MRN: [id number]  HPI: Jesus Guerrero is a 48 y.o. male  Chief Complaint  Patient presents with  . Foot Pain    Right foot, swollen and painful since Saturday   FOOT PAIN Duration: 3-4 days Involved foot: right ankle Mechanism of injury: unknown Location: R lateral ankle Onset: sudden  Severity: moderate  Quality:  Aching and sore Frequency: constant Radiation: no Aggravating factors: weight bearing, walking, running, stairs, bending and movement  Alleviating factors: tylenol   Status: better Treatments attempted: APAP  Relief with NSAIDs?:  No NSAIDs Taken Weakness with weight bearing or walking: no Morning stiffness: no Swelling: yes Redness: no Bruising: no Paresthesias / decreased sensation: no  Fevers:no  Relevant past medical, surgical, family and social history reviewed and updated as indicated. Interim medical history since our last visit reviewed. Allergies and medications reviewed and updated.  Review of Systems  Constitutional: Negative.   Respiratory: Negative.   Cardiovascular: Negative.   Musculoskeletal: Positive for arthralgias and joint swelling. Negative for back pain, gait problem, myalgias, neck pain and neck stiffness.  Skin: Negative.   Psychiatric/Behavioral: Negative.     Per HPI unless specifically indicated above     Objective:    BP (!) 142/82   Pulse 83   Temp 98.3 F (36.8 C) (Oral)   SpO2 97%   Wt Readings from Last 3 Encounters:  05/29/19 193 lb (87.5 kg)  03/02/19 187 lb (84.8 kg)  02/05/19 192 lb 6 oz (87.3 kg)    Physical Exam Vitals signs and nursing note reviewed.  Constitutional:      General: He is not in acute distress.    Appearance: Normal appearance. He is not ill-appearing, toxic-appearing or diaphoretic.  HENT:     Head: Normocephalic and  atraumatic.     Right Ear: External ear normal.     Left Ear: External ear normal.     Nose: Nose normal.     Mouth/Throat:     Mouth: Mucous membranes are moist.     Pharynx: Oropharynx is clear.  Eyes:     General: No scleral icterus.       Right eye: No discharge.        Left eye: No discharge.     Extraocular Movements: Extraocular movements intact.     Conjunctiva/sclera: Conjunctivae normal.     Pupils: Pupils are equal, round, and reactive to light.  Neck:     Musculoskeletal: Normal range of motion and neck supple.  Cardiovascular:     Rate and Rhythm: Normal rate and regular rhythm.     Pulses: Normal pulses.     Heart sounds: Normal heart sounds. No murmur. No friction rub. No gallop.   Pulmonary:     Effort: Pulmonary effort is normal. No respiratory distress.     Breath sounds: Normal breath sounds. No stridor. No wheezing, rhonchi or rales.  Chest:     Chest wall: No tenderness.  Musculoskeletal: Normal range of motion.        General: Swelling (mildly swollen R ankle, No redness, no heat) present.  Skin:    General: Skin is warm and dry.     Capillary Refill: Capillary refill takes less than 2 seconds.     Coloration: Skin is  not jaundiced or pale.     Findings: No bruising, erythema, lesion or rash.  Neurological:     General: No focal deficit present.     Mental Status: He is alert and oriented to person, place, and time. Mental status is at baseline.  Psychiatric:        Mood and Affect: Mood normal.        Behavior: Behavior normal.        Thought Content: Thought content normal.        Judgment: Judgment normal.     Results for orders placed or performed in visit on 03/02/19  Veritor Flu A/B Waived  Result Value Ref Range   Influenza A Positive (A) Negative   Influenza B Negative Negative      Assessment & Plan:   Problem List Items Addressed This Visit    None    Visit Diagnoses    Right ankle swelling    -  Primary   Not red or hot, mildly  swollen- will check uric acid- if high, will treat, if not, will get him into see podiatry for evaluation. Call with any concerns.    Acute idiopathic gout of right ankle       On allopurinol. Checking labs. Await results- treat accordingly   Relevant Orders   Uric acid   Comprehensive metabolic panel       Follow up plan: Return if symptoms worsen or fail to improve.

## 2019-06-10 ENCOUNTER — Telehealth: Payer: Self-pay | Admitting: Family Medicine

## 2019-06-10 ENCOUNTER — Encounter: Payer: Self-pay | Admitting: Family Medicine

## 2019-06-10 LAB — COMPREHENSIVE METABOLIC PANEL
ALT: 21 IU/L (ref 0–44)
AST: 22 IU/L (ref 0–40)
Albumin/Globulin Ratio: 1.6 (ref 1.2–2.2)
Albumin: 4.4 g/dL (ref 4.0–5.0)
Alkaline Phosphatase: 94 IU/L (ref 39–117)
BUN/Creatinine Ratio: 18 (ref 9–20)
BUN: 22 mg/dL (ref 6–24)
Bilirubin Total: 0.5 mg/dL (ref 0.0–1.2)
CO2: 23 mmol/L (ref 20–29)
Calcium: 9.1 mg/dL (ref 8.7–10.2)
Chloride: 105 mmol/L (ref 96–106)
Creatinine, Ser: 1.24 mg/dL (ref 0.76–1.27)
GFR calc Af Amer: 78 mL/min/{1.73_m2} (ref >59)
GFR calc non Af Amer: 68 mL/min/{1.73_m2} (ref >59)
Globulin, Total: 2.7 g/dL (ref 1.5–4.5)
Glucose: 92 mg/dL (ref 65–99)
Potassium: 4.4 mmol/L (ref 3.5–5.2)
Sodium: 143 mmol/L (ref 134–144)
Total Protein: 7.1 g/dL (ref 6.0–8.5)

## 2019-06-10 LAB — URIC ACID: Uric Acid: 6.9 mg/dL (ref 3.7–8.6)

## 2019-06-10 NOTE — Telephone Encounter (Signed)
Please let him know that his uric acid levels are normal so I don't think it's his gout- can we see if we can get him into see Dr. Milinda Pointer his podiatrist. Thanks!

## 2019-06-10 NOTE — Telephone Encounter (Signed)
Mychart message sent.

## 2019-06-15 ENCOUNTER — Other Ambulatory Visit: Payer: Self-pay | Admitting: Family Medicine

## 2019-06-22 ENCOUNTER — Other Ambulatory Visit: Payer: Self-pay | Admitting: Family Medicine

## 2019-06-22 ENCOUNTER — Encounter: Payer: Self-pay | Admitting: Family Medicine

## 2019-06-22 ENCOUNTER — Telehealth: Payer: Self-pay | Admitting: Family Medicine

## 2019-06-22 MED ORDER — ALLOPURINOL 100 MG PO TABS: 100.0000 mg | ORAL_TABLET | Freq: Every day | ORAL | 3 refills | Status: DC

## 2019-06-22 NOTE — Telephone Encounter (Signed)
Medication: allopurinol (ZYLOPRIM) 100 MG tablet  Has the patient contacted their pharmacy? yes  (Agent: If no, request that the patient contact the pharmacy for the refill.) (Agent: If yes, when and what did the pharmacy advise?)  Preferred Pharmacy (with phone number or street name): [facility] [place] - [address]  Agent: Please be advised that RX refills may take up to 3 business days. We ask that you follow-up with your pharmacy.

## 2019-06-22 NOTE — Telephone Encounter (Signed)
RX appears to be from April 2019.

## 2019-06-25 ENCOUNTER — Other Ambulatory Visit: Payer: Self-pay | Admitting: Family Medicine

## 2019-06-25 ENCOUNTER — Encounter: Payer: Self-pay | Admitting: Family Medicine

## 2019-06-25 NOTE — Telephone Encounter (Signed)
Pt called stating pharmacy still has not received this medication refill request. Pt states he has no more of this medication. Please advise.

## 2019-06-26 ENCOUNTER — Encounter: Payer: Self-pay | Admitting: Family Medicine

## 2019-06-26 ENCOUNTER — Encounter: Payer: Self-pay | Admitting: Emergency Medicine

## 2019-06-26 ENCOUNTER — Other Ambulatory Visit: Payer: Self-pay

## 2019-06-26 ENCOUNTER — Emergency Department
Admission: EM | Admit: 2019-06-26 | Discharge: 2019-06-26 | Disposition: A | Discharge status: Home / Self Care | Payer: Medicaid Other | Attending: Emergency Medicine | Admitting: Emergency Medicine

## 2019-06-26 ENCOUNTER — Emergency Department: Payer: Medicaid Other

## 2019-06-26 DIAGNOSIS — R079 Chest pain, unspecified: Secondary | ICD-10-CM | POA: Diagnosis present

## 2019-06-26 DIAGNOSIS — Z79899 Other long term (current) drug therapy: Secondary | ICD-10-CM | POA: Insufficient documentation

## 2019-06-26 DIAGNOSIS — K529 Noninfective gastroenteritis and colitis, unspecified: Secondary | ICD-10-CM | POA: Insufficient documentation

## 2019-06-26 DIAGNOSIS — R112 Nausea with vomiting, unspecified: Secondary | ICD-10-CM | POA: Insufficient documentation

## 2019-06-26 LAB — CBC WITH DIFFERENTIAL/PLATELET
Abs Immature Granulocytes: 0.11 10*3/uL — ABNORMAL HIGH (ref 0.00–0.07)
Basophils Absolute: 0.1 10*3/uL (ref 0.0–0.1)
Basophils Relative: 0 %
Eosinophils Absolute: 0 10*3/uL (ref 0.0–0.5)
Eosinophils Relative: 0 %
HCT: 48.5 % (ref 39.0–52.0)
Hemoglobin: 16.4 g/dL (ref 13.0–17.0)
Immature Granulocytes: 1 %
Lymphocytes Relative: 11 %
Lymphs Abs: 1.9 10*3/uL (ref 0.7–4.0)
MCH: 30.3 pg (ref 26.0–34.0)
MCHC: 33.8 g/dL (ref 30.0–36.0)
MCV: 89.6 fL (ref 80.0–100.0)
Monocytes Absolute: 1.2 10*3/uL — ABNORMAL HIGH (ref 0.1–1.0)
Monocytes Relative: 7 %
Neutro Abs: 14.6 10*3/uL — ABNORMAL HIGH (ref 1.7–7.7)
Neutrophils Relative %: 81 %
Platelets: 283 10*3/uL (ref 150–400)
RBC: 5.41 MIL/uL (ref 4.22–5.81)
RDW: 13.1 % (ref 11.5–15.5)
WBC: 17.9 10*3/uL — ABNORMAL HIGH (ref 4.0–10.5)
nRBC: 0 % (ref 0.0–0.2)

## 2019-06-26 LAB — COMPREHENSIVE METABOLIC PANEL
ALT: 25 U/L (ref 0–44)
AST: 33 U/L (ref 15–41)
Albumin: 4.8 g/dL (ref 3.5–5.0)
Alkaline Phosphatase: 102 U/L (ref 38–126)
Anion gap: 16 — ABNORMAL HIGH (ref 5–15)
BUN: 25 mg/dL — ABNORMAL HIGH (ref 6–20)
CO2: 14 mmol/L — ABNORMAL LOW (ref 22–32)
Calcium: 9.5 mg/dL (ref 8.9–10.3)
Chloride: 108 mmol/L (ref 98–111)
Creatinine, Ser: 1.12 mg/dL (ref 0.61–1.24)
GFR calc Af Amer: >60 mL/min (ref >60)
GFR calc non Af Amer: >60 mL/min (ref >60)
Glucose, Bld: 150 mg/dL — ABNORMAL HIGH (ref 70–99)
Potassium: 4.1 mmol/L (ref 3.5–5.1)
Sodium: 138 mmol/L (ref 135–145)
Total Bilirubin: 1.5 mg/dL — ABNORMAL HIGH (ref 0.3–1.2)
Total Protein: 9 g/dL — ABNORMAL HIGH (ref 6.5–8.1)

## 2019-06-26 LAB — BLOOD GAS, VENOUS
Acid-base deficit: 5 mmol/L — ABNORMAL HIGH (ref 0.0–2.0)
Bicarbonate: 21.1 mmol/L (ref 20.0–28.0)
O2 Saturation: 77 %
Patient temperature: 37
pCO2, Ven: 42 mmHg — ABNORMAL LOW (ref 44.0–60.0)
pH, Ven: 7.31 (ref 7.250–7.430)
pO2, Ven: 46 mmHg — ABNORMAL HIGH (ref 32.0–45.0)

## 2019-06-26 LAB — TROPONIN I (HIGH SENSITIVITY)
Troponin I (High Sensitivity): <2 ng/L (ref <18)
Troponin I (High Sensitivity): 3 ng/L (ref <18)

## 2019-06-26 LAB — BASIC METABOLIC PANEL
Anion gap: 10 (ref 5–15)
BUN: 25 mg/dL — ABNORMAL HIGH (ref 6–20)
CO2: 21 mmol/L — ABNORMAL LOW (ref 22–32)
Calcium: 8.1 mg/dL — ABNORMAL LOW (ref 8.9–10.3)
Chloride: 108 mmol/L (ref 98–111)
Creatinine, Ser: 0.89 mg/dL (ref 0.61–1.24)
GFR calc Af Amer: >60 mL/min (ref >60)
GFR calc non Af Amer: >60 mL/min (ref >60)
Glucose, Bld: 110 mg/dL — ABNORMAL HIGH (ref 70–99)
Potassium: 4.4 mmol/L (ref 3.5–5.1)
Sodium: 139 mmol/L (ref 135–145)

## 2019-06-26 LAB — LACTIC ACID, PLASMA: Lactic Acid, Venous: 1.7 mmol/L (ref 0.5–1.9)

## 2019-06-26 LAB — CK: Total CK: 67 U/L (ref 49–397)

## 2019-06-26 LAB — LIPASE, BLOOD: Lipase: 31 U/L (ref 11–51)

## 2019-06-26 MED ORDER — ONDANSETRON 4 MG PO TBDP: 4.0000 mg | ORAL_TABLET | Freq: Four times a day (QID) | ORAL | 0 refills | Status: DC | PRN

## 2019-06-26 MED ORDER — ONDANSETRON HCL 4 MG/2ML IJ SOLN
4.0000 mg | Freq: Once | INTRAMUSCULAR | Status: AC
  Administered 2019-06-26: 09:00:00 4 mg via INTRAVENOUS
  Filled 2019-06-26: qty 2

## 2019-06-26 MED ORDER — CIPROFLOXACIN HCL 500 MG PO TABS: 500.0000 mg | ORAL_TABLET | Freq: Two times a day (BID) | ORAL | 0 refills | Status: AC

## 2019-06-26 MED ORDER — MORPHINE SULFATE (PF) 4 MG/ML IV SOLN
4.0000 mg | Freq: Once | INTRAVENOUS | Status: AC
  Administered 2019-06-26: 09:00:00 4 mg via INTRAVENOUS
  Filled 2019-06-26: qty 1

## 2019-06-26 MED ORDER — SODIUM CHLORIDE 0.9 % IV BOLUS
1000.0000 mL | Freq: Once | INTRAVENOUS | Status: AC
  Administered 2019-06-26: 1000 mL via INTRAVENOUS

## 2019-06-26 MED ORDER — ONDANSETRON 4 MG PO TBDP
4.0000 mg | ORAL_TABLET | Freq: Once | ORAL | Status: AC
  Administered 2019-06-26: 4 mg via ORAL
  Filled 2019-06-26: qty 1

## 2019-06-26 MED ORDER — IOPAMIDOL (ISOVUE-370) INJECTION 76%
100.0000 mL | Freq: Once | INTRAVENOUS | Status: AC | PRN
  Administered 2019-06-26: 11:00:00 100 mL via INTRAVENOUS

## 2019-06-26 MED ORDER — CIPROFLOXACIN HCL 500 MG PO TABS
500.0000 mg | ORAL_TABLET | Freq: Once | ORAL | Status: AC
  Administered 2019-06-26: 15:00:00 500 mg via ORAL
  Filled 2019-06-26: qty 1
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
## 2019-06-26 NOTE — ED Notes (Signed)
Patient had 200 ml emesis in triage

## 2019-06-26 NOTE — ED Triage Notes (Signed)
Pt to triage via w/c, mask in place with no distress noted; pt reports this morning having indigestion, lower chest pain radiating into back accomp by N/V

## 2019-06-26 NOTE — ED Notes (Signed)
Called Pt's caregiver and left message. Will follow up at a later time.

## 2019-06-26 NOTE — Telephone Encounter (Signed)
I called and spoke w/ Pharmacy. They are getting RX ready. Please see mychart message for full details.

## 2019-06-26 NOTE — Discharge Instructions (Addendum)
Please read the included information and stick to a bland diet for the next two days.  Drink plenty of clear fluids, and if you were provided with a prescription, please take it according to the label instructions.    If you develop any new or worsening symptoms, including persistent vomiting not controlled with medication, fever greater than 101, severe or worsening abdominal pain, or other symptoms that concern you, please return immediately to the Emergency Department.

## 2019-06-27 ENCOUNTER — Encounter: Payer: Self-pay | Admitting: Family Medicine

## 2019-06-29 ENCOUNTER — Encounter: Payer: Self-pay | Admitting: Family Medicine

## 2019-06-30 ENCOUNTER — Encounter: Payer: Self-pay | Admitting: Family Medicine

## 2019-07-06 ENCOUNTER — Ambulatory Visit: Payer: Medicaid Other | Admitting: Family Medicine

## 2019-07-06 ENCOUNTER — Other Ambulatory Visit: Payer: Self-pay

## 2019-07-06 ENCOUNTER — Encounter: Payer: Self-pay | Admitting: Family Medicine

## 2019-07-06 VITALS — BP 134/76 | HR 80 | Temp 98.7°F | Ht 64.0 in | Wt 193.0 lb

## 2019-07-06 DIAGNOSIS — D72829 Elevated white blood cell count, unspecified: Secondary | ICD-10-CM | POA: Diagnosis not present

## 2019-07-06 DIAGNOSIS — R1031 Right lower quadrant pain: Secondary | ICD-10-CM | POA: Diagnosis not present

## 2019-07-06 DIAGNOSIS — K529 Noninfective gastroenteritis and colitis, unspecified: Secondary | ICD-10-CM | POA: Diagnosis not present

## 2019-07-06 DIAGNOSIS — G8929 Other chronic pain: Secondary | ICD-10-CM

## 2019-07-06 DIAGNOSIS — R1032 Left lower quadrant pain: Secondary | ICD-10-CM

## 2019-07-06 NOTE — Addendum Note (Signed)
Addended by: Valerie Roys on: 07/06/2019 03:05 PM   Modules accepted: Orders

## 2019-07-06 NOTE — Progress Notes (Signed)
BP 134/76   Pulse 80   Temp 98.7 F (37.1 C) (Oral)   Ht 5\' 4"  (1.626 m)   Wt 193 lb (87.5 kg)   SpO2 97%   BMI 33.13 kg/m    Subjective:    Patient ID: Jesus Guerrero, male    DOB: 01-03-1971, 48 y.o.   MRN: [id number]  HPI: [name] is a 48 y.o. male  Chief Complaint  Patient presents with  . Hospitalization Follow-up   ER FOLLOW UP Time since discharge: 10 days Hospital/facility: [facility] Diagnosis: Enterocolitis Procedures/tests: Labs, EKG, CT abdomen, CXR CLINICAL DATA:  Lower chest pain radiating to the back.  EXAM: CT ANGIOGRAPHY CHEST, ABDOMEN AND PELVIS  TECHNIQUE: Multidetector CT imaging through the chest, abdomen and pelvis was performed using the standard protocol during bolus administration of intravenous contrast. Multiplanar reconstructed images and MIPs were obtained and reviewed to evaluate the vascular anatomy.  CONTRAST:  164mL ISOVUE-370 IOPAMIDOL (ISOVUE-370) INJECTION 76%  COMPARISON:  None.  FINDINGS: CTA CHEST FINDINGS  Cardiovascular: Preferential opacification of the thoracic aorta. No evidence of thoracic aortic aneurysm or dissection. Normal heart size. No pericardial effusion.  Mediastinum/Nodes: No enlarged mediastinal, hilar, or axillary lymph nodes. Thyroid gland and trachea demonstrate no significant findings. Mild esophageal wall thickening distally as can be seen with mild esophagitis. Small hiatal hernia.  Lungs/Pleura: Lungs are clear. No pleural effusion or pneumothorax.  Musculoskeletal: No acute osseous abnormality. No aggressive osseous lesion.  Review of the MIP images confirms the above findings.  CTA ABDOMEN AND PELVIS FINDINGS  VASCULAR  Aorta: Normal caliber aorta without aneurysm, dissection, vasculitis or significant stenosis.  Celiac: Patent without evidence of aneurysm, dissection, vasculitis or significant stenosis.  SMA: Patent without evidence of aneurysm, dissection,  vasculitis or significant stenosis.  Renals: Both renal arteries are patent without evidence of aneurysm, dissection, vasculitis, fibromuscular dysplasia or significant stenosis.  IMA: Patent without evidence of aneurysm, dissection, vasculitis or significant stenosis.  Inflow: Patent without evidence of aneurysm, dissection, vasculitis or significant stenosis.  Veins: No obvious venous abnormality within the limitations of this arterial phase study.  Review of the MIP images confirms the above findings.  NON-VASCULAR  Hepatobiliary: No focal liver abnormality is seen. Status post cholecystectomy. No biliary dilatation.  Pancreas: Unremarkable. No pancreatic ductal dilatation or surrounding inflammatory changes.  Spleen: Normal in size without focal abnormality.  Adrenals/Urinary Tract: Adrenal glands are unremarkable. Kidneys are normal, without renal calculi, focal lesion, or hydronephrosis. Bladder is unremarkable.  Stomach/Bowel: Stomach is within normal limits. Appendix appears normal. No evidence of bowel wall thickening, distention, or inflammatory changes. Fluid throughout the colon with numerous air-fluid levels. Few nondilated loops of fluid-filled small bowel.  Lymphatic: No lymphadenopathy.  Reproductive: Prostate is unremarkable.  Other: No abdominal wall hernia or abnormality. No abdominopelvic ascites.  Musculoskeletal: No acute osseous abnormality. No aggressive osseous lesion.  Review of the MIP images confirms the above findings.  IMPRESSION: 1. No aortic aneurysm or dissection. 2. No acute cardiopulmonary disease. 3. Fluid throughout the colon with numerous air-fluid levels. Few nondilated loops of fluid-filled small bowel. These findings can be seen in setting of enterocolitis. 4. Mild esophageal wall thickening distally as can be seen with mild esophagitis. Small hiatal hernia.  Consultants: None New medications: cipro  Discharge instructions: follow up here   Status: better  Since getting out of the ER, Jesus Guerrero has been feeling better. He had some diarrhea while on the antibiotic, but that is gone now. No nausea, no  vomiting, belly pain is better now.   Relevant past medical, surgical, family and social history reviewed and updated as indicated. Interim medical history since our last visit reviewed. Allergies and medications reviewed and updated.  Review of Systems  Constitutional: Negative.   Respiratory: Negative.   Cardiovascular: Negative.   Gastrointestinal: Negative for abdominal distention, abdominal pain, anal bleeding, blood in stool, constipation, diarrhea, nausea, rectal pain and vomiting.  Musculoskeletal: Negative.   Skin: Negative.   Psychiatric/Behavioral: Negative.     Per HPI unless specifically indicated above     Objective:    BP 134/76   Pulse 80   Temp 98.7 F (37.1 C) (Oral)   Ht 5\' 4"  (1.626 m)   Wt 193 lb (87.5 kg)   SpO2 97%   BMI 33.13 kg/m   Wt Readings from Last 3 Encounters:  07/06/19 193 lb (87.5 kg)  06/26/19 193 lb (87.5 kg)  05/29/19 193 lb (87.5 kg)    Physical Exam Vitals signs and nursing note reviewed.  Constitutional:      General: He is not in acute distress.    Appearance: Normal appearance. He is not ill-appearing, toxic-appearing or diaphoretic.  HENT:     Head: Normocephalic and atraumatic.     Right Ear: External ear normal.     Left Ear: External ear normal.     Nose: Nose normal.     Mouth/Throat:     Mouth: Mucous membranes are moist.     Pharynx: Oropharynx is clear.  Eyes:     General: No scleral icterus.       Right eye: No discharge.        Left eye: No discharge.     Extraocular Movements: Extraocular movements intact.     Conjunctiva/sclera: Conjunctivae normal.     Pupils: Pupils are equal, round, and reactive to light.  Neck:     Musculoskeletal: Normal range of motion and neck supple.  Cardiovascular:     Rate and  Rhythm: Normal rate and regular rhythm.     Pulses: Normal pulses.     Heart sounds: Normal heart sounds. No murmur. No friction rub. No gallop.   Pulmonary:     Effort: Pulmonary effort is normal. No respiratory distress.     Breath sounds: Normal breath sounds. No stridor. No wheezing, rhonchi or rales.  Chest:     Chest wall: No tenderness.  Abdominal:     General: Abdomen is flat. Bowel sounds are normal. There is no distension.     Palpations: Abdomen is soft. There is no mass.     Tenderness: There is no abdominal tenderness. There is no right CVA tenderness, left CVA tenderness, guarding or rebound.     Hernia: No hernia is present.  Musculoskeletal: Normal range of motion.  Skin:    General: Skin is warm and dry.     Capillary Refill: Capillary refill takes less than 2 seconds.     Coloration: Skin is not jaundiced or pale.     Findings: No bruising, erythema, lesion or rash.  Neurological:     General: No focal deficit present.     Mental Status: He is alert and oriented to person, place, and time. Mental status is at baseline.  Psychiatric:        Mood and Affect: Mood normal.        Behavior: Behavior normal.        Thought Content: Thought content normal.        Judgment:  Judgment normal.     Results for orders placed or performed during the hospital encounter of 06/26/19  CBC with Differential  Result Value Ref Range   WBC 17.9 (H) 4.0 - 10.5 K/uL   RBC 5.41 4.22 - 5.81 MIL/uL   Hemoglobin 16.4 13.0 - 17.0 g/dL   HCT 48.5 39.0 - 52.0 %   MCV 89.6 80.0 - 100.0 fL   MCH 30.3 26.0 - 34.0 pg   MCHC 33.8 30.0 - 36.0 g/dL   RDW 13.1 11.5 - 15.5 %   Platelets 283 150 - 400 K/uL   nRBC 0.0 0.0 - 0.2 %   Neutrophils Relative % 81 %   Neutro Abs 14.6 (H) 1.7 - 7.7 K/uL   Lymphocytes Relative 11 %   Lymphs Abs 1.9 0.7 - 4.0 K/uL   Monocytes Relative 7 %   Monocytes Absolute 1.2 (H) 0.1 - 1.0 K/uL   Eosinophils Relative 0 %   Eosinophils Absolute 0.0 0.0 - 0.5 K/uL    Basophils Relative 0 %   Basophils Absolute 0.1 0.0 - 0.1 K/uL   Immature Granulocytes 1 %   Abs Immature Granulocytes 0.11 (H) 0.00 - 0.07 K/uL  Comprehensive metabolic panel  Result Value Ref Range   Sodium 138 135 - 145 mmol/L   Potassium 4.1 3.5 - 5.1 mmol/L   Chloride 108 98 - 111 mmol/L   CO2 14 (L) 22 - 32 mmol/L   Glucose, Bld 150 (H) 70 - 99 mg/dL   BUN 25 (H) 6 - 20 mg/dL   Creatinine, Ser 1.12 0.61 - 1.24 mg/dL   Calcium 9.5 8.9 - 10.3 mg/dL   Total Protein 9.0 (H) 6.5 - 8.1 g/dL   Albumin 4.8 3.5 - 5.0 g/dL   AST 33 15 - 41 U/L   ALT 25 0 - 44 U/L   Alkaline Phosphatase 102 38 - 126 U/L   Total Bilirubin 1.5 (H) 0.3 - 1.2 mg/dL   GFR calc non Af Amer >60 >60 mL/min   GFR calc Af Amer >60 >60 mL/min   Anion gap 16 (H) 5 - 15  Troponin I (High Sensitivity)  Result Value Ref Range   Troponin I (High Sensitivity) <2 <18 ng/L  Lipase, blood  Result Value Ref Range   Lipase 31 11 - 51 U/L  Lactic acid, plasma  Result Value Ref Range   Lactic Acid, Venous 1.7 0.5 - 1.9 mmol/L  Blood gas, venous  Result Value Ref Range   pH, Ven 7.31 7.250 - 7.430   pCO2, Ven 42 (L) 44.0 - 60.0 mmHg   pO2, Ven 46.0 (H) 32.0 - 45.0 mmHg   Bicarbonate 21.1 20.0 - 28.0 mmol/L   Acid-base deficit 5.0 (H) 0.0 - 2.0 mmol/L   O2 Saturation 77.0 %   Patient temperature 37.0    Collection site VENOUS    Sample type VENOUS   CK  Result Value Ref Range   Total CK 67 49 - 397 U/L  Basic metabolic panel  Result Value Ref Range   Sodium 139 135 - 145 mmol/L   Potassium 4.4 3.5 - 5.1 mmol/L   Chloride 108 98 - 111 mmol/L   CO2 21 (L) 22 - 32 mmol/L   Glucose, Bld 110 (H) 70 - 99 mg/dL   BUN 25 (H) 6 - 20 mg/dL   Creatinine, Ser 0.89 0.61 - 1.24 mg/dL   Calcium 8.1 (L) 8.9 - 10.3 mg/dL   GFR calc non Af Amer >60 >60  mL/min   GFR calc Af Amer >60 >60 mL/min   Anion gap 10 5 - 15  Troponin I (High Sensitivity)  Result Value Ref Range   Troponin I (High Sensitivity) 3 <18 ng/L       Assessment & Plan:   Problem List Items Addressed This Visit    None    Visit Diagnoses    Enterocolitis    -  Primary   Resolved. Feeling much better. ER wanted him to follow up with GI- referral generated. Continue to monitor. Rechecking labs. Call with any concerns.    Relevant Orders   Comprehensive metabolic panel   Leukocytosis, unspecified type       Improved on the cipro. Continue to monitor. Call with any concerns.    Relevant Orders   Comprehensive metabolic panel   CBC With Differential/Platelet       Follow up plan: Return As scheduled.

## 2019-07-07 LAB — COMPREHENSIVE METABOLIC PANEL
ALT: 21 IU/L (ref 0–44)
AST: 20 IU/L (ref 0–40)
Albumin/Globulin Ratio: 1.8 (ref 1.2–2.2)
Albumin: 4.5 g/dL (ref 4.0–5.0)
Alkaline Phosphatase: 94 IU/L (ref 39–117)
BUN/Creatinine Ratio: 20 (ref 9–20)
BUN: 25 mg/dL — ABNORMAL HIGH (ref 6–24)
Bilirubin Total: 0.5 mg/dL (ref 0.0–1.2)
CO2: 24 mmol/L (ref 20–29)
Calcium: 9.2 mg/dL (ref 8.7–10.2)
Chloride: 103 mmol/L (ref 96–106)
Creatinine, Ser: 1.23 mg/dL (ref 0.76–1.27)
GFR calc Af Amer: 79 mL/min/{1.73_m2} (ref >59)
GFR calc non Af Amer: 69 mL/min/{1.73_m2} (ref >59)
Globulin, Total: 2.5 g/dL (ref 1.5–4.5)
Glucose: 89 mg/dL (ref 65–99)
Potassium: 4.8 mmol/L (ref 3.5–5.2)
Sodium: 142 mmol/L (ref 134–144)
Total Protein: 7 g/dL (ref 6.0–8.5)

## 2019-07-07 LAB — CBC WITH DIFFERENTIAL/PLATELET
Hematocrit: 42.2 % (ref 37.5–51.0)
Hemoglobin: 15 g/dL (ref 13.0–17.7)
Lymphocytes Absolute: 1.9 10*3/uL (ref 0.7–3.1)
Lymphs: 23 %
MCH: 31.9 pg (ref 26.6–33.0)
MCHC: 35.5 g/dL (ref 31.5–35.7)
MCV: 90 fL (ref 79–97)
MID (Absolute): 0.6 10*3/uL (ref 0.1–1.6)
MID: 6 %
Neutrophils Absolute: 5.8 10*3/uL (ref 1.4–7.0)
Neutrophils: 71 %
Platelets: 227 10*3/uL (ref 150–450)
RBC: 4.7 x10E6/uL (ref 4.14–5.80)
RDW: 13.9 % (ref 11.6–15.4)
WBC: 8.3 10*3/uL (ref 3.4–10.8)

## 2019-07-07 NOTE — Telephone Encounter (Signed)
Alwyn Ren was looking into this for [place] since he was in office and had a pending test still. It appears it was cancelled on the 6th. Requisition stated it samples was not received.

## 2019-07-13 ENCOUNTER — Encounter: Payer: Self-pay | Admitting: Family Medicine

## 2019-07-17 ENCOUNTER — Encounter: Payer: Self-pay | Admitting: Family Medicine

## 2019-07-23 ENCOUNTER — Encounter: Payer: Self-pay | Admitting: Gastroenterology

## 2019-07-29 ENCOUNTER — Encounter: Payer: Self-pay | Admitting: Gastroenterology

## 2019-07-31 ENCOUNTER — Encounter: Payer: Self-pay | Admitting: Family Medicine

## 2019-08-01 ENCOUNTER — Encounter: Payer: Self-pay | Admitting: Family Medicine

## 2019-08-14 IMAGING — CT CT ABD-PELV W/ CM
2 of 5 series · 16 of 46 positions shown, 18 images · IV contrast (APPLIED)
Comparison: 05/30/2015 CT

CLINICAL DATA: Sudden onset nausea, vomiting and diarrhea x1 day
with lower abdominal pain.

EXAM:
CT ABDOMEN AND PELVIS WITH CONTRAST
TECHNIQUE: Multidetector CT imaging of the abdomen and pelvis was performed
using the standard protocol following bolus administration of
intravenous contrast.
CONTRAST:  100mL X0743N-HTT IOPAMIDOL (X0743N-HTT) INJECTION 61%

[Series 2: routine abd/pel with · axial · 0.68mm/px · z∈[-404,+36]mm · 13 of 102 slices shown, 15 images]
[im 7/102  soft-tissue]
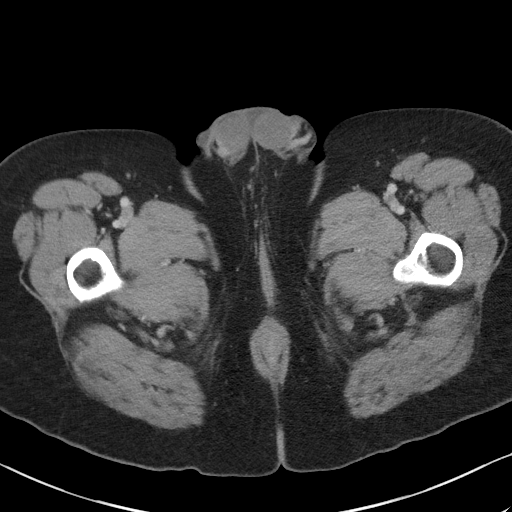
[im 7/102  bone]
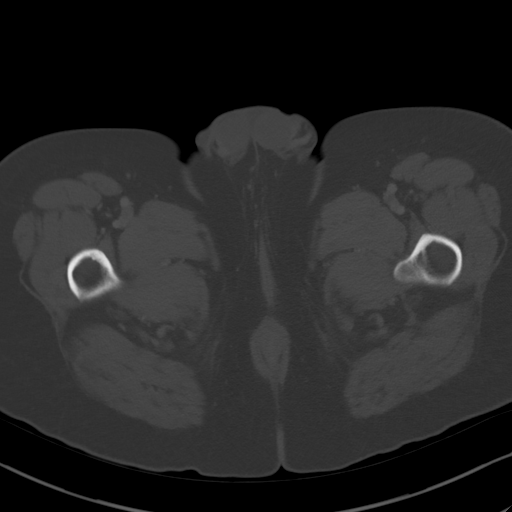
[im 13/102  soft-tissue]
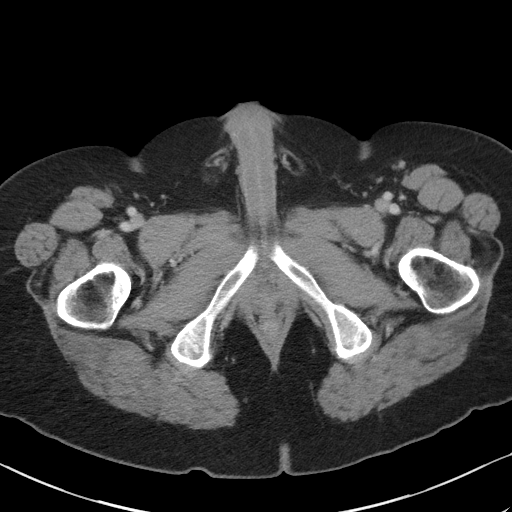
[im 19/102  soft-tissue]
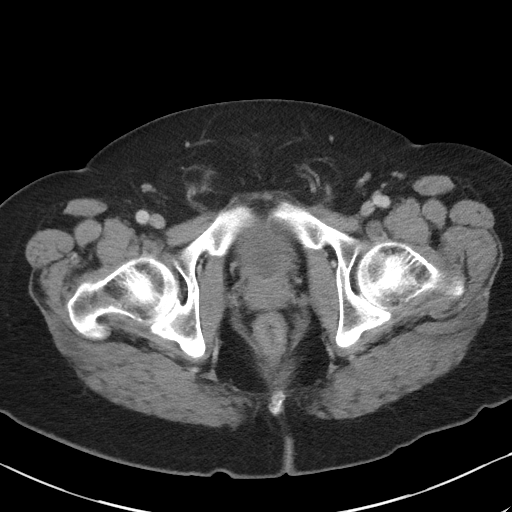
[im 32/102  soft-tissue]
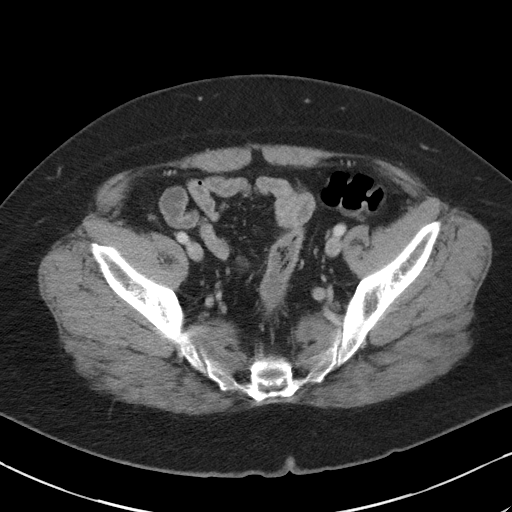
[im 38/102  soft-tissue]
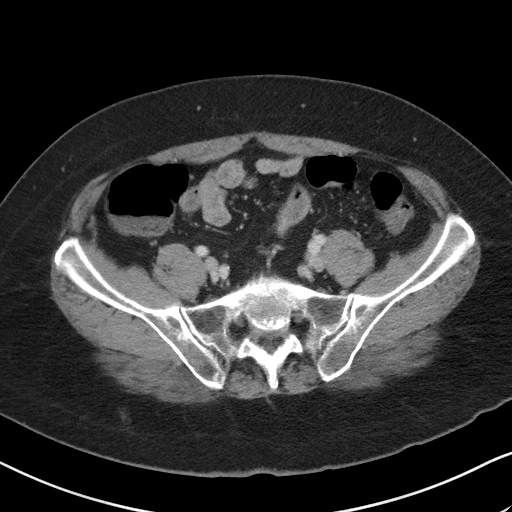
[im 45/102  soft-tissue]
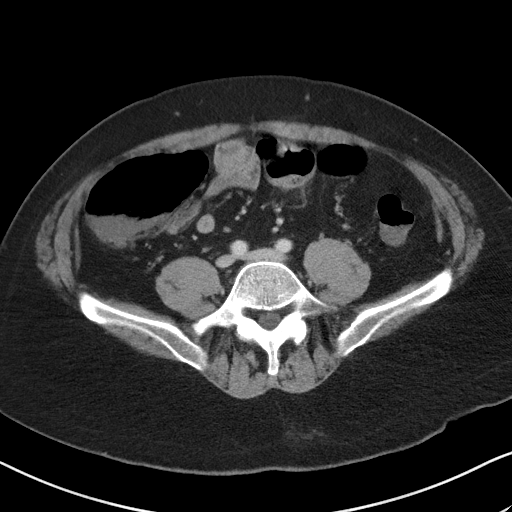
[im 51/102  soft-tissue]
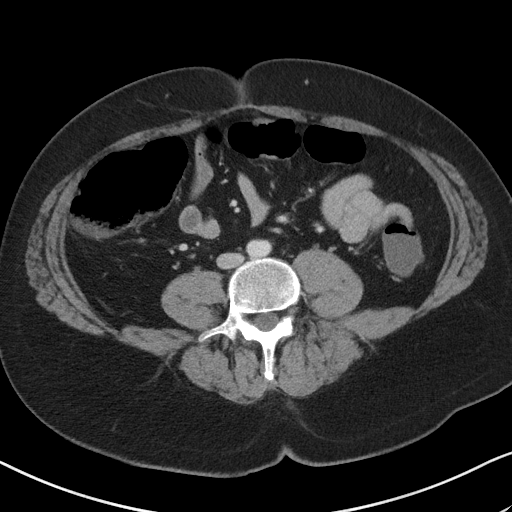
[im 57/102  soft-tissue]
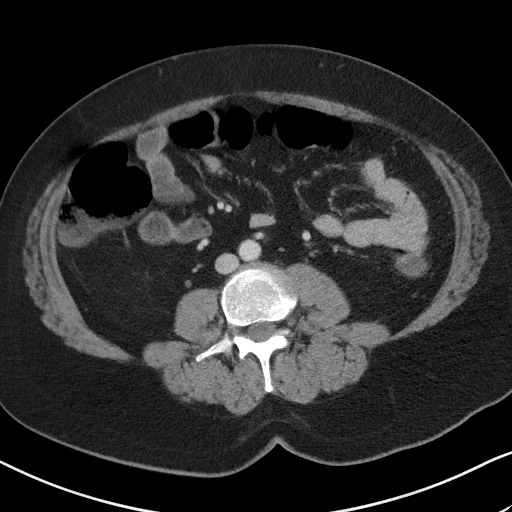
[im 64/102  soft-tissue]
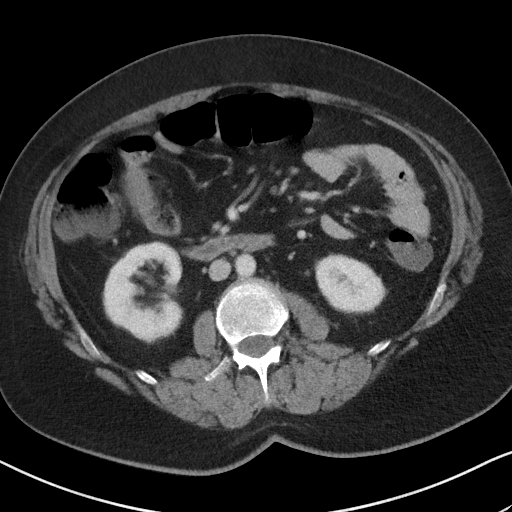
[im 64/102  bone]
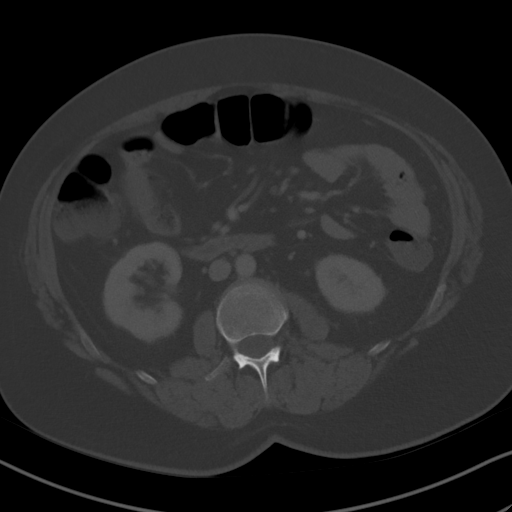
[im 70/102  soft-tissue]
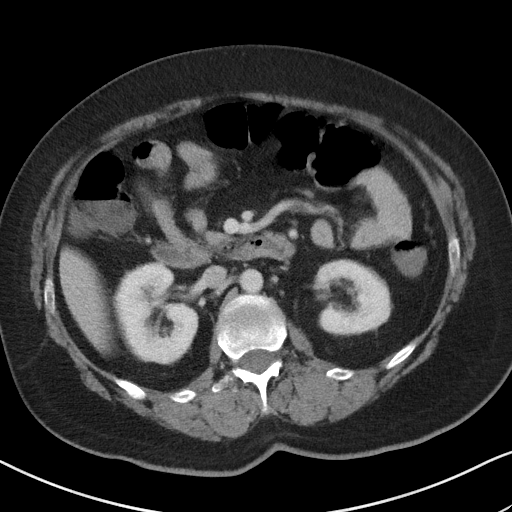
[im 83/102  soft-tissue]
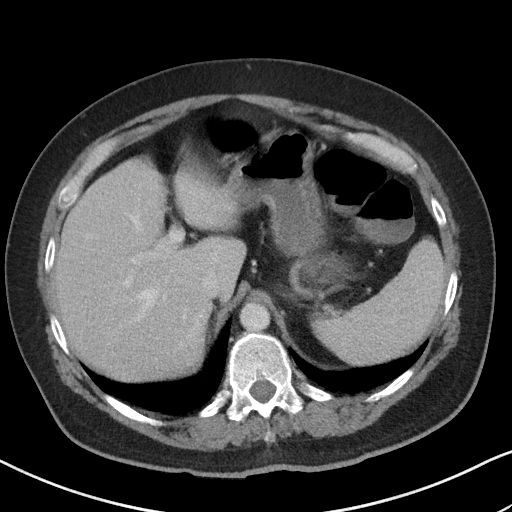
[im 89/102  soft-tissue]
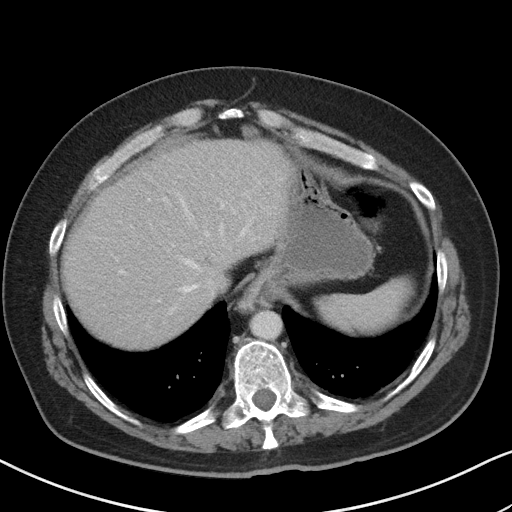
[im 95/102  soft-tissue]
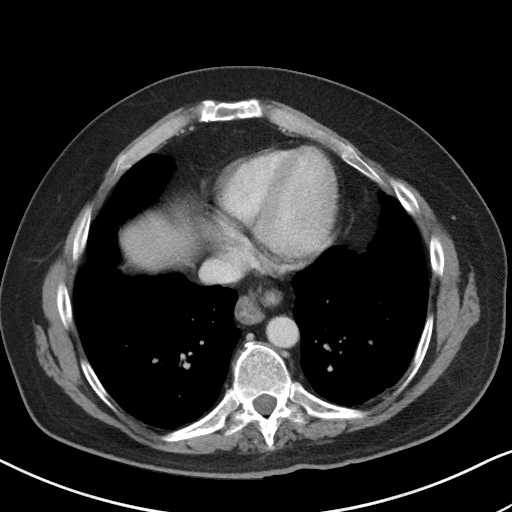

[Series 5: coronal st · coronal · 0.81mm/px · 3 of 92 slices shown]
[im 31/92  soft-tissue]
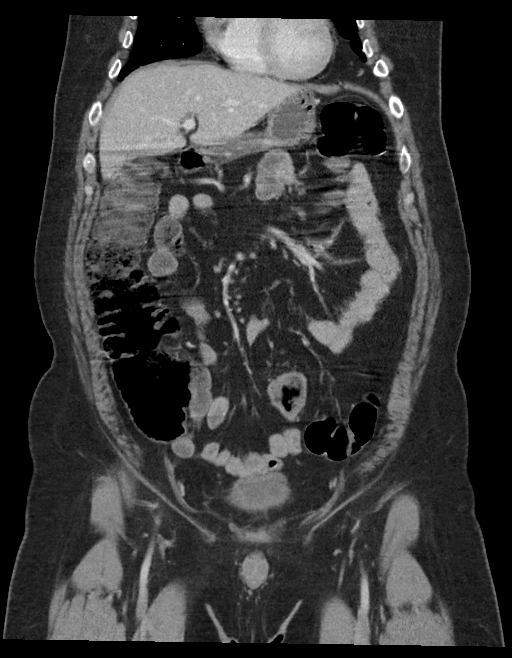
[im 41/92  soft-tissue]
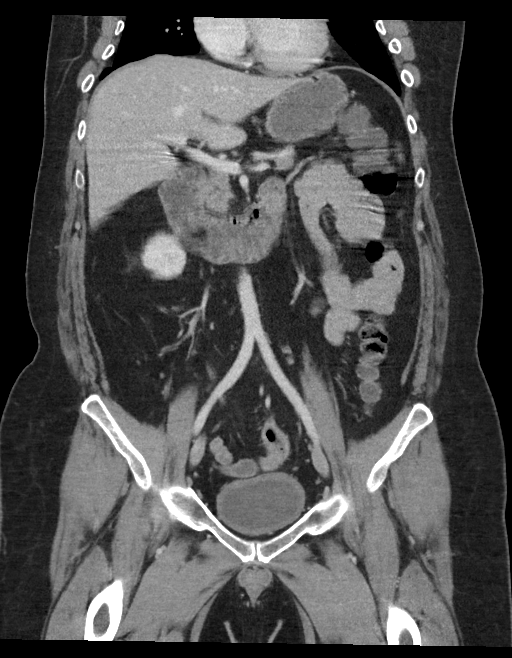
[im 51/92  soft-tissue]
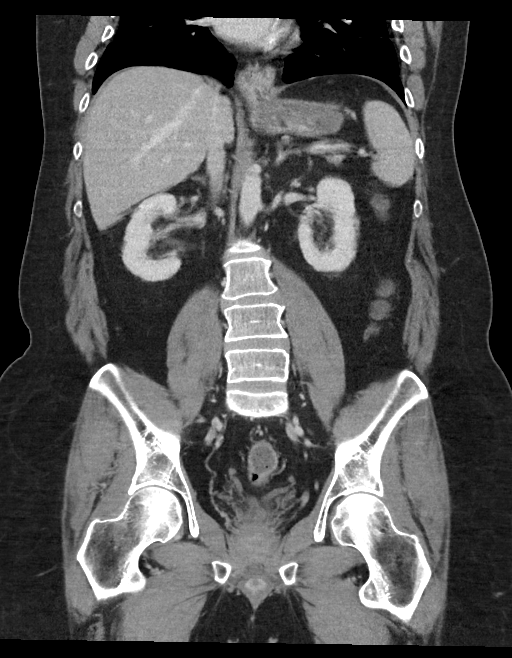

[16 of 46 positions shown; findings below may reference images not displayed]

FINDINGS: Lower chest: Small to moderate-sized hiatal hernia. Heart size is
normal. No pericardial effusion. Dependent atelectasis at each lung
base.

Hepatobiliary: Cholecystectomy.  No mass or biliary dilatation.

Pancreas: Normal

Spleen: Normal

Adrenals/Urinary Tract: Normal bilateral adrenal glands. Cortical
enhancement is symmetric with reference both kidneys. No enhancing
mass lesion, nephrolithiasis nor hydroureteronephrosis. The urinary
bladder is slightly thick-walled but this may be secondary to
underdistention. Cystitis is not entirely excluded.

Stomach/Bowel: Physiologic distention of the stomach. Duodenal sweep
and ligament of Treitz position are normal. No bowel obstruction.
Normal appendix, distal and terminal ileum. No inflammation of the
colon.

Vascular/Lymphatic: Nonaneurysmal aorta.  No lymphadenopathy.

Reproductive: Normal size prostate and seminal vesicles.

Other: Bilateral fat containing inguinal hernias.

Musculoskeletal: Mild disc flattening L2-3 and L3-4. No acute nor
suspicious osseous lesions. Gentle dextroconvex curvature of the
upper lumbar spine.
IMPRESSION: 1. Mild urinary bladder wall thickening similar to prior. Correlate
to exclude cystitis.
2. Small to moderate-sized hiatal hernia. No bowel obstruction or
inflammation.
3. Fat containing bilateral inguinal hernias similar in appearance
to prior.

## 2019-08-15 ENCOUNTER — Encounter: Payer: Self-pay | Admitting: Family Medicine

## 2019-08-16 IMAGING — DX DG ABDOMEN 1V
1 series · 1 of 1 positions shown · non-contrast
Comparison: CT scan of August 26, 2018. Radiographs May 07, 2015.

CLINICAL DATA: Generalized abdominal pain.

EXAM:
ABDOMEN - 1 VIEW

[abdomen supine]
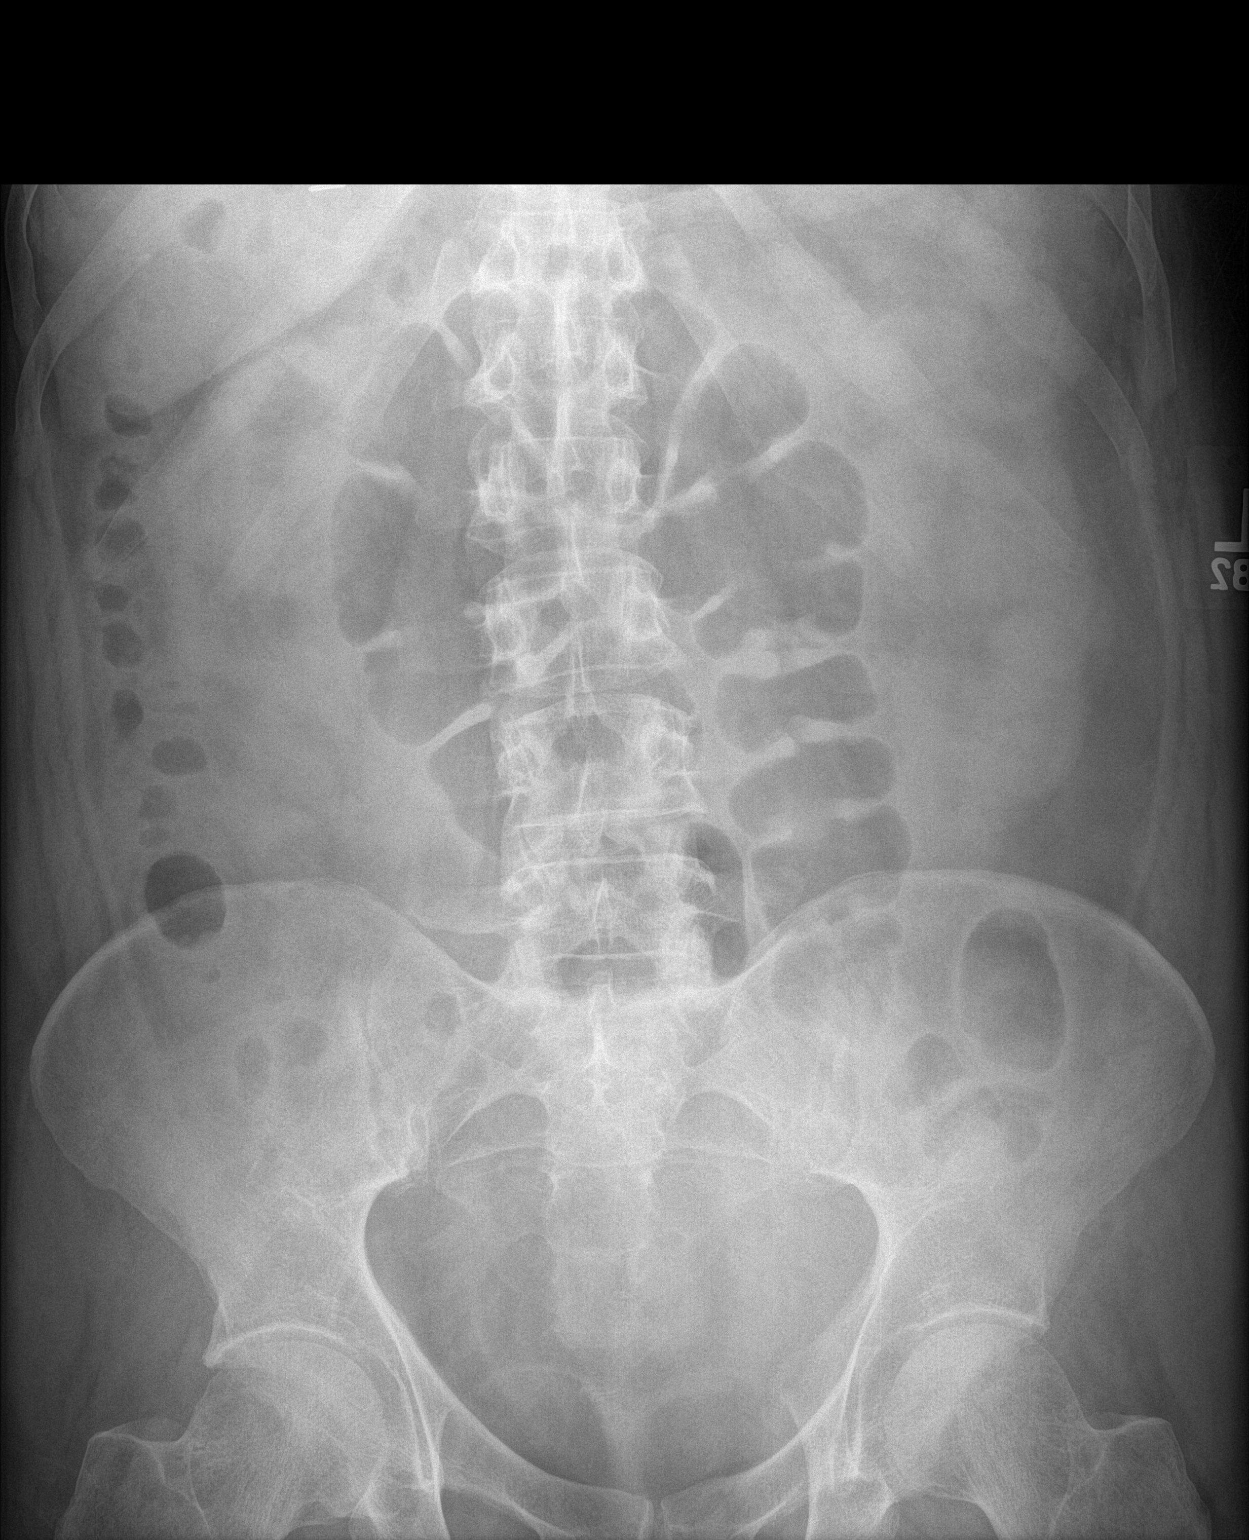

[1 of 1 positions shown; findings below may reference images not displayed]

FINDINGS: The bowel gas pattern is normal. No radio-opaque calculi or other
significant radiographic abnormality are seen. Status post
cholecystectomy.
IMPRESSION: No evidence of bowel obstruction or ileus.

## 2019-08-18 ENCOUNTER — Encounter: Payer: Self-pay | Admitting: Family Medicine

## 2019-08-18 ENCOUNTER — Ambulatory Visit: Payer: Medicaid Other | Admitting: Gastroenterology

## 2019-08-27 ENCOUNTER — Encounter: Payer: Self-pay | Admitting: Family Medicine

## 2019-08-29 ENCOUNTER — Encounter: Payer: Self-pay | Admitting: Family Medicine

## 2019-09-01 ENCOUNTER — Ambulatory Visit: Payer: Medicaid Other | Admitting: Family Medicine

## 2019-09-01 ENCOUNTER — Encounter: Payer: Self-pay | Admitting: Family Medicine

## 2019-09-01 ENCOUNTER — Other Ambulatory Visit: Payer: Self-pay

## 2019-09-01 VITALS — BP 114/70 | HR 82 | Temp 98.6°F | Ht 64.0 in | Wt 191.0 lb

## 2019-09-01 DIAGNOSIS — E782 Mixed hyperlipidemia: Secondary | ICD-10-CM

## 2019-09-01 DIAGNOSIS — M10072 Idiopathic gout, left ankle and foot: Secondary | ICD-10-CM | POA: Diagnosis not present

## 2019-09-01 DIAGNOSIS — Z23 Encounter for immunization: Secondary | ICD-10-CM | POA: Diagnosis not present

## 2019-09-01 MED ORDER — ATORVASTATIN CALCIUM 20 MG PO TABS: 20.0000 mg | ORAL_TABLET | Freq: Every day | ORAL | 1 refills | Status: DC

## 2019-09-01 NOTE — Progress Notes (Signed)
BP 114/70   Pulse 82   Temp 98.6 F (37 C) (Oral)   Ht 5\' 4"  (1.626 m)   Wt 191 lb (86.6 kg)   SpO2 96%   BMI 32.79 kg/m    Subjective:    Patient ID: Jesus Guerrero, male    DOB: Apr 21, 1971, 48 y.o.   MRN: [id number]  HPI: DAVARION SAFARIAN is a 48 y.o. male  Chief Complaint  Patient presents with  . Hyperlipidemia   HYPERLIPIDEMIA Hyperlipidemia status: excellent compliance Satisfied with current treatment?  yes Side effects:  no Medication compliance: excellent compliance Past cholesterol meds: atorvastatin Supplements: none Aspirin:  no The 10-year ASCVD risk score [name], et al., 2013) is: 2.2%   Values used to calculate the score:     Age: 37 years     Sex: Male     Is Non-Hispanic African American: No     Diabetic: No     Tobacco smoker: No     Systolic Blood Pressure: 99991111 mmHg     Is BP treated: No     HDL Cholesterol: 42 mg/dL     Total Cholesterol: 160 mg/dL Chest pain:  no Coronary artery disease:  no  GOUT- no flares. Feeling well. Tolerating his medicine well. No redness. No swelling. No joint pain.  Had a couple of biopsies done at dermatology. They are hurting, but healing well.    Relevant past medical, surgical, family and social history reviewed and updated as indicated. Interim medical history since our last visit reviewed. Allergies and medications reviewed and updated.  Review of Systems  Constitutional: Negative.   Respiratory: Negative.   Cardiovascular: Negative.   Musculoskeletal: Negative.   Skin: Positive for wound. Negative for color change, pallor and rash.    Per HPI unless specifically indicated above     Objective:    BP 114/70   Pulse 82   Temp 98.6 F (37 C) (Oral)   Ht 5\' 4"  (1.626 m)   Wt 191 lb (86.6 kg)   SpO2 96%   BMI 32.79 kg/m   Wt Readings from Last 3 Encounters:  09/01/19 191 lb (86.6 kg)  07/06/19 193 lb (87.5 kg)  06/26/19 193 lb (87.5 kg)    Physical Exam Vitals signs and nursing  note reviewed.  Constitutional:      General: He is not in acute distress.    Appearance: Normal appearance. He is not ill-appearing, toxic-appearing or diaphoretic.  HENT:     Head: Normocephalic and atraumatic.     Right Ear: External ear normal.     Left Ear: External ear normal.     Nose: Nose normal.     Mouth/Throat:     Mouth: Mucous membranes are moist.     Pharynx: Oropharynx is clear.  Eyes:     General: No scleral icterus.       Right eye: No discharge.        Left eye: No discharge.     Extraocular Movements: Extraocular movements intact.     Conjunctiva/sclera: Conjunctivae normal.     Pupils: Pupils are equal, round, and reactive to light.  Neck:     Musculoskeletal: Normal range of motion and neck supple.  Cardiovascular:     Rate and Rhythm: Normal rate and regular rhythm.     Pulses: Normal pulses.     Heart sounds: Normal heart sounds. No murmur. No friction rub. No gallop.   Pulmonary:     Effort: Pulmonary  effort is normal. No respiratory distress.     Breath sounds: Normal breath sounds. No stridor. No wheezing, rhonchi or rales.  Chest:     Chest wall: No tenderness.  Musculoskeletal: Normal range of motion.  Skin:    General: Skin is warm and dry.     Capillary Refill: Capillary refill takes less than 2 seconds.     Coloration: Skin is not jaundiced or pale.     Findings: No bruising, erythema, lesion or rash.     Comments: Well healing punch biopsies on back of his leg and his L buttock  Neurological:     General: No focal deficit present.     Mental Status: He is alert and oriented to person, place, and time. Mental status is at baseline.  Psychiatric:        Mood and Affect: Mood normal.        Behavior: Behavior normal.        Thought Content: Thought content normal.        Judgment: Judgment normal.     Results for orders placed or performed in visit on 07/06/19  Comprehensive metabolic panel  Result Value Ref Range   Glucose 89 65 - 99  mg/dL   BUN 25 (H) 6 - 24 mg/dL   Creatinine, Ser 1.23 0.76 - 1.27 mg/dL   GFR calc non Af Amer 69 >59 mL/min/1.73   GFR calc Af Amer 79 >59 mL/min/1.73   BUN/Creatinine Ratio 20 9 - 20   Sodium 142 134 - 144 mmol/L   Potassium 4.8 3.5 - 5.2 mmol/L   Chloride 103 96 - 106 mmol/L   CO2 24 20 - 29 mmol/L   Calcium 9.2 8.7 - 10.2 mg/dL   Total Protein 7.0 6.0 - 8.5 g/dL   Albumin 4.5 4.0 - 5.0 g/dL   Globulin, Total 2.5 1.5 - 4.5 g/dL   Albumin/Globulin Ratio 1.8 1.2 - 2.2   Bilirubin Total 0.5 0.0 - 1.2 mg/dL   Alkaline Phosphatase 94 39 - 117 IU/L   AST 20 0 - 40 IU/L   ALT 21 0 - 44 IU/L  CBC With Differential/Platelet  Result Value Ref Range   WBC 8.3 3.4 - 10.8 x10E3/uL   RBC 4.70 4.14 - 5.80 x10E6/uL   Hemoglobin 15.0 13.0 - 17.7 g/dL   Hematocrit 42.2 37.5 - 51.0 %   MCV 90 79 - 97 fL   MCH 31.9 26.6 - 33.0 pg   MCHC 35.5 31.5 - 35.7 g/dL   RDW 13.9 11.6 - 15.4 %   Platelets 227 150 - 450 x10E3/uL   Neutrophils 71 Not Estab. %   Lymphs 23 Not Estab. %   MID 6 Not Estab. %   Neutrophils Absolute 5.8 1.4 - 7.0 x10E3/uL   Lymphocytes Absolute 1.9 0.7 - 3.1 x10E3/uL   MID (Absolute) 0.6 0.1 - 1.6 X10E3/uL      Assessment & Plan:   Problem List Items Addressed This Visit      Musculoskeletal and Integument   Acute idiopathic gout involving toe of left foot - Primary    No flared. Under good control on current regimen. Continue current regimen. Continue to monitor. Call with any concerns. Refills given. Labs drawn today.       Relevant Orders   Comprehensive metabolic panel   Uric acid     Other   Hyperlipidemia    Under good control on current regimen. Continue current regimen. Continue to monitor. Call with any concerns.  Refills given. Labs drawn today.       Relevant Medications   atorvastatin (LIPITOR) 20 MG tablet   Other Relevant Orders   Comprehensive metabolic panel   Lipid Panel w/o Chol/HDL Ratio    Other Visit Diagnoses    Needs flu shot        Flu shot given today.   Relevant Orders   Flu Vaccine QUAD 6+ mos PF IM (Fluarix Quad PF) (Completed)       Follow up plan: Return in about 3 months (around 12/01/2019).

## 2019-09-01 NOTE — Assessment & Plan Note (Signed)
Under good control on current regimen. Continue current regimen. Continue to monitor. Call with any concerns. Refills given. Labs drawn today.   

## 2019-09-01 NOTE — Assessment & Plan Note (Signed)
No flared. Under good control on current regimen. Continue current regimen. Continue to monitor. Call with any concerns. Refills given. Labs drawn today.

## 2019-09-02 ENCOUNTER — Encounter: Payer: Self-pay | Admitting: Family Medicine

## 2019-09-02 LAB — COMPREHENSIVE METABOLIC PANEL
ALT: 25 IU/L (ref 0–44)
AST: 23 IU/L (ref 0–40)
Albumin/Globulin Ratio: 1.7 (ref 1.2–2.2)
Albumin: 4.5 g/dL (ref 4.0–5.0)
Alkaline Phosphatase: 99 IU/L (ref 39–117)
BUN/Creatinine Ratio: 22 — ABNORMAL HIGH (ref 9–20)
BUN: 26 mg/dL — ABNORMAL HIGH (ref 6–24)
Bilirubin Total: 0.7 mg/dL (ref 0.0–1.2)
CO2: 24 mmol/L (ref 20–29)
Calcium: 9.6 mg/dL (ref 8.7–10.2)
Chloride: 106 mmol/L (ref 96–106)
Creatinine, Ser: 1.17 mg/dL (ref 0.76–1.27)
GFR calc Af Amer: 84 mL/min/{1.73_m2} (ref >59)
GFR calc non Af Amer: 73 mL/min/{1.73_m2} (ref >59)
Globulin, Total: 2.6 g/dL (ref 1.5–4.5)
Glucose: 101 mg/dL — ABNORMAL HIGH (ref 65–99)
Potassium: 4.8 mmol/L (ref 3.5–5.2)
Sodium: 142 mmol/L (ref 134–144)
Total Protein: 7.1 g/dL (ref 6.0–8.5)

## 2019-09-02 LAB — LIPID PANEL W/O CHOL/HDL RATIO
Cholesterol, Total: 158 mg/dL (ref 100–199)
HDL: 41 mg/dL (ref >39)
LDL Chol Calc (NIH): 75 mg/dL (ref 0–99)
Triglycerides: 260 mg/dL — ABNORMAL HIGH (ref 0–149)
VLDL Cholesterol Cal: 42 mg/dL — ABNORMAL HIGH (ref 5–40)

## 2019-09-02 LAB — URIC ACID: Uric Acid: 7.6 mg/dL (ref 3.7–8.6)

## 2019-09-03 ENCOUNTER — Encounter: Payer: Self-pay | Admitting: Family Medicine

## 2019-09-04 ENCOUNTER — Encounter: Payer: Self-pay | Admitting: Family Medicine

## 2019-09-06 ENCOUNTER — Encounter: Payer: Self-pay | Admitting: Family Medicine

## 2019-09-15 ENCOUNTER — Encounter: Payer: Self-pay | Admitting: Gastroenterology

## 2019-09-15 ENCOUNTER — Ambulatory Visit: Payer: Medicaid Other | Admitting: Gastroenterology

## 2019-09-15 ENCOUNTER — Other Ambulatory Visit: Payer: Self-pay

## 2019-09-15 VITALS — BP 132/72 | HR 83 | Temp 98.1°F | Ht 64.0 in | Wt 194.6 lb

## 2019-09-15 DIAGNOSIS — R933 Abnormal findings on diagnostic imaging of other parts of digestive tract: Secondary | ICD-10-CM | POA: Diagnosis not present

## 2019-09-15 DIAGNOSIS — K529 Noninfective gastroenteritis and colitis, unspecified: Secondary | ICD-10-CM | POA: Diagnosis not present
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
## 2019-09-16 ENCOUNTER — Encounter: Payer: Self-pay | Admitting: Family Medicine

## 2019-09-21 ENCOUNTER — Encounter: Payer: Self-pay | Admitting: Family Medicine

## 2019-09-24 ENCOUNTER — Encounter: Payer: Self-pay | Admitting: Family Medicine

## 2019-09-24 MED ORDER — HYDROXYZINE HCL 10 MG PO TABS: ORAL_TABLET | ORAL | 6 refills | Status: DC

## 2019-09-25 ENCOUNTER — Encounter: Payer: Self-pay | Admitting: Family Medicine

## 2019-10-12 ENCOUNTER — Encounter: Payer: Self-pay | Admitting: Family Medicine

## 2019-10-15 ENCOUNTER — Encounter: Payer: Self-pay | Admitting: Family Medicine

## 2019-10-17 ENCOUNTER — Encounter: Payer: Self-pay | Admitting: Family Medicine

## 2019-10-18 ENCOUNTER — Encounter: Payer: Self-pay | Admitting: Family Medicine

## 2019-10-18 ENCOUNTER — Other Ambulatory Visit: Payer: Self-pay

## 2019-10-18 ENCOUNTER — Emergency Department
Admission: EM | Admit: 2019-10-18 | Discharge: 2019-10-18 | Disposition: A | Discharge status: Home / Self Care | Payer: Medicaid Other | Attending: Emergency Medicine | Admitting: Emergency Medicine

## 2019-10-18 DIAGNOSIS — Z20828 Contact with and (suspected) exposure to other viral communicable diseases: Secondary | ICD-10-CM | POA: Diagnosis not present

## 2019-10-18 DIAGNOSIS — Z79899 Other long term (current) drug therapy: Secondary | ICD-10-CM | POA: Insufficient documentation

## 2019-10-18 DIAGNOSIS — R05 Cough: Secondary | ICD-10-CM | POA: Insufficient documentation

## 2019-10-18 DIAGNOSIS — R0981 Nasal congestion: Secondary | ICD-10-CM | POA: Diagnosis present

## 2019-10-18 DIAGNOSIS — J069 Acute upper respiratory infection, unspecified: Secondary | ICD-10-CM | POA: Diagnosis not present

## 2019-10-18 MED ORDER — GUAIFENESIN-DM 100-10 MG/5ML PO SYRP: 5.0000 mL | ORAL_SOLUTION | ORAL | 0 refills | Status: DC | PRN

## 2019-10-18 MED ORDER — FLUTICASONE PROPIONATE 50 MCG/ACT NA SUSP: 2.0000 | Freq: Every day | NASAL | 0 refills | Status: DC

## 2019-10-18 MED ORDER — BENZONATATE 100 MG PO CAPS: 100.0000 mg | ORAL_CAPSULE | Freq: Three times a day (TID) | ORAL | 0 refills | Status: DC | PRN
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
## 2019-10-18 NOTE — Discharge Instructions (Signed)
Her symptoms are consistent with a virus.  Your Covid test is pending.  You can take Flonase for your nasal congestion and Robitussin and Tessalon Perles for your cough.  Please call primary care tomorrow for an appointment this week.  Please return to the emergency department for any high fevers, shortness of breath, chest pain, any other symptoms concerning to you.

## 2019-10-18 NOTE — ED Triage Notes (Signed)
Pt c/o sinus and chest congestion with a sore throat since yesterday.

## 2019-10-18 NOTE — ED Notes (Signed)
See triage note  Presents with sinus pressure and congestion since yesterday  Denies any fever  But has had some cough  Afebrile on arrival

## 2019-10-19 ENCOUNTER — Encounter: Payer: Self-pay | Admitting: Family Medicine

## 2019-10-19 LAB — NOVEL CORONAVIRUS, NAA (HOSP ORDER, SEND-OUT TO REF LAB; TAT 18-24 HRS): SARS-CoV-2, NAA: NOT DETECTED

## 2019-10-20 ENCOUNTER — Encounter: Payer: Self-pay | Admitting: Family Medicine

## 2019-10-22 ENCOUNTER — Other Ambulatory Visit: Payer: Self-pay | Admitting: Family Medicine

## 2019-10-22 ENCOUNTER — Encounter: Payer: Self-pay | Admitting: Family Medicine

## 2019-10-23 ENCOUNTER — Other Ambulatory Visit: Payer: Self-pay | Admitting: Family Medicine

## 2019-10-23 MED ORDER — CETIRIZINE HCL 10 MG PO TABS: 10.0000 mg | ORAL_TABLET | Freq: Every day | ORAL | 3 refills | Status: DC

## 2019-10-29 ENCOUNTER — Ambulatory Visit: Payer: Self-pay | Admitting: Family Medicine

## 2019-11-02 ENCOUNTER — Encounter: Payer: Self-pay | Admitting: Family Medicine

## 2019-11-03 ENCOUNTER — Ambulatory Visit: Payer: Self-pay | Admitting: Family Medicine

## 2019-11-15 ENCOUNTER — Encounter: Payer: Self-pay | Admitting: Family Medicine

## 2019-11-17 ENCOUNTER — Encounter: Payer: Self-pay | Admitting: Family Medicine

## 2019-11-17 NOTE — Telephone Encounter (Signed)
Left voicemail to schedule virtual apt due to cough 11/17/2019

## 2019-11-18 ENCOUNTER — Encounter: Payer: Self-pay | Admitting: Family Medicine

## 2019-11-20 ENCOUNTER — Encounter: Payer: Self-pay | Admitting: Family Medicine

## 2019-11-22 ENCOUNTER — Encounter: Payer: Self-pay | Admitting: Family Medicine

## 2019-11-24 ENCOUNTER — Other Ambulatory Visit: Payer: Self-pay

## 2019-11-24 ENCOUNTER — Emergency Department
Admission: EM | Admit: 2019-11-24 | Discharge: 2019-11-24 | Disposition: A | Discharge status: Home / Self Care | Payer: Medicaid Other | Attending: Emergency Medicine | Admitting: Emergency Medicine

## 2019-11-24 ENCOUNTER — Encounter: Payer: Self-pay | Admitting: Medical Oncology

## 2019-11-24 DIAGNOSIS — U071 COVID-19: Secondary | ICD-10-CM | POA: Diagnosis not present

## 2019-11-24 DIAGNOSIS — R110 Nausea: Secondary | ICD-10-CM | POA: Diagnosis present

## 2019-11-24 DIAGNOSIS — Z79899 Other long term (current) drug therapy: Secondary | ICD-10-CM | POA: Diagnosis not present

## 2019-11-24 MED ORDER — ONDANSETRON 4 MG PO TBDP: 4.0000 mg | ORAL_TABLET | Freq: Three times a day (TID) | ORAL | 0 refills | Status: DC | PRN

## 2019-11-24 MED ORDER — ONDANSETRON 8 MG PO TBDP
8.0000 mg | ORAL_TABLET | Freq: Once | ORAL | Status: AC
  Administered 2019-11-24: 15:00:00 8 mg via ORAL
  Filled 2019-11-24: qty 1

## 2019-11-24 NOTE — ED Triage Notes (Signed)
Pt reports he was diagnosed with covid last Wednesday and since then has had poor appetite and nausea. Pt in NAD.
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
## 2019-11-24 NOTE — Discharge Instructions (Addendum)
Follow-up with your regular doctor if not improved in 3 days.  Return emergency department worsening.  Take medication as prescribed.  Drink plenty of fluids to stay hydrated.

## 2019-11-26 ENCOUNTER — Encounter: Payer: Self-pay | Admitting: Family Medicine

## 2019-11-27 ENCOUNTER — Encounter: Payer: Self-pay | Admitting: Family Medicine

## 2019-11-29 ENCOUNTER — Encounter: Payer: Self-pay | Admitting: Intensive Care

## 2019-11-29 ENCOUNTER — Other Ambulatory Visit: Payer: Self-pay

## 2019-11-29 ENCOUNTER — Emergency Department
Admission: EM | Admit: 2019-11-29 | Discharge: 2019-11-29 | Disposition: A | Discharge status: Home / Self Care | Payer: Medicaid Other | Attending: Emergency Medicine | Admitting: Emergency Medicine

## 2019-11-29 DIAGNOSIS — R197 Diarrhea, unspecified: Secondary | ICD-10-CM | POA: Diagnosis not present

## 2019-11-29 DIAGNOSIS — R112 Nausea with vomiting, unspecified: Secondary | ICD-10-CM | POA: Diagnosis present

## 2019-11-29 DIAGNOSIS — A0839 Other viral enteritis: Secondary | ICD-10-CM | POA: Insufficient documentation

## 2019-11-29 DIAGNOSIS — R531 Weakness: Secondary | ICD-10-CM | POA: Diagnosis not present

## 2019-11-29 DIAGNOSIS — Z79899 Other long term (current) drug therapy: Secondary | ICD-10-CM | POA: Insufficient documentation

## 2019-11-29 DIAGNOSIS — U071 COVID-19: Secondary | ICD-10-CM | POA: Insufficient documentation

## 2019-11-29 LAB — LIPASE, BLOOD: Lipase: 34 U/L (ref 11–51)

## 2019-11-29 LAB — COMPREHENSIVE METABOLIC PANEL
ALT: 20 U/L (ref 0–44)
AST: 26 U/L (ref 15–41)
Albumin: 4.1 g/dL (ref 3.5–5.0)
Alkaline Phosphatase: 67 U/L (ref 38–126)
Anion gap: 12 (ref 5–15)
BUN: 21 mg/dL — ABNORMAL HIGH (ref 6–20)
CO2: 24 mmol/L (ref 22–32)
Calcium: 9.1 mg/dL (ref 8.9–10.3)
Chloride: 106 mmol/L (ref 98–111)
Creatinine, Ser: 1.18 mg/dL (ref 0.61–1.24)
GFR calc Af Amer: >60 mL/min (ref >60)
GFR calc non Af Amer: >60 mL/min (ref >60)
Glucose, Bld: 104 mg/dL — ABNORMAL HIGH (ref 70–99)
Potassium: 4.1 mmol/L (ref 3.5–5.1)
Sodium: 142 mmol/L (ref 135–145)
Total Bilirubin: 1 mg/dL (ref 0.3–1.2)
Total Protein: 8.7 g/dL — ABNORMAL HIGH (ref 6.5–8.1)

## 2019-11-29 LAB — CBC WITH DIFFERENTIAL/PLATELET
Abs Immature Granulocytes: 0.03 10*3/uL (ref 0.00–0.07)
Basophils Absolute: 0 10*3/uL (ref 0.0–0.1)
Basophils Relative: 0 %
Eosinophils Absolute: 0 10*3/uL (ref 0.0–0.5)
Eosinophils Relative: 0 %
HCT: 44.4 % (ref 39.0–52.0)
Hemoglobin: 15.5 g/dL (ref 13.0–17.0)
Immature Granulocytes: 0 %
Lymphocytes Relative: 15 %
Lymphs Abs: 1.3 10*3/uL (ref 0.7–4.0)
MCH: 30.3 pg (ref 26.0–34.0)
MCHC: 34.9 g/dL (ref 30.0–36.0)
MCV: 86.9 fL (ref 80.0–100.0)
Monocytes Absolute: 0.5 10*3/uL (ref 0.1–1.0)
Monocytes Relative: 5 %
Neutro Abs: 6.7 10*3/uL (ref 1.7–7.7)
Neutrophils Relative %: 80 %
Platelets: 216 10*3/uL (ref 150–400)
RBC: 5.11 MIL/uL (ref 4.22–5.81)
RDW: 12.3 % (ref 11.5–15.5)
WBC: 8.5 10*3/uL (ref 4.0–10.5)
nRBC: 0 % (ref 0.0–0.2)

## 2019-11-29 LAB — URINALYSIS, COMPLETE (UACMP) WITH MICROSCOPIC
Bacteria, UA: NONE SEEN
Bilirubin Urine: NEGATIVE
Glucose, UA: NEGATIVE mg/dL
Ketones, ur: NEGATIVE mg/dL
Leukocytes,Ua: NEGATIVE
Nitrite: NEGATIVE
Protein, ur: NEGATIVE mg/dL
Specific Gravity, Urine: 1.021 (ref 1.005–1.030)
pH: 5 (ref 5.0–8.0)

## 2019-11-29 LAB — TROPONIN I (HIGH SENSITIVITY): Troponin I (High Sensitivity): 4 ng/L (ref <18)

## 2019-11-29 MED ORDER — SODIUM CHLORIDE 0.9 % IV BOLUS
500.0000 mL | Freq: Once | INTRAVENOUS | Status: AC
  Administered 2019-11-29: 500 mL via INTRAVENOUS
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
## 2019-11-29 NOTE — Discharge Instructions (Addendum)
Follow-up with your regular doctor if not better in 3 days.  Return emergency department worsening.  Take over-the-counter Imodium AD for diarrhea.  You should have a prescription for Zofran already.  Please take this as needed.

## 2019-11-29 NOTE — ED Notes (Signed)
Pt denies nausea. Blanket given.

## 2019-11-29 NOTE — ED Triage Notes (Signed)
Patient COVID + 11/18/19. C/o poor appetite, N/V, and weakness. NAD noted. V/S WNL. [place] to ambulate with no problems. Arrived by EMS from [employer] [employer]

## 2019-11-30 ENCOUNTER — Encounter: Payer: Self-pay | Admitting: Family Medicine

## 2019-12-01 ENCOUNTER — Encounter: Payer: Self-pay | Admitting: Family Medicine

## 2019-12-01 ENCOUNTER — Other Ambulatory Visit: Payer: Self-pay

## 2019-12-01 ENCOUNTER — Ambulatory Visit (INDEPENDENT_AMBULATORY_CARE_PROVIDER_SITE_OTHER): Payer: Medicaid Other | Admitting: Family Medicine

## 2019-12-01 DIAGNOSIS — U071 COVID-19: Secondary | ICD-10-CM | POA: Diagnosis not present

## 2019-12-01 DIAGNOSIS — E782 Mixed hyperlipidemia: Secondary | ICD-10-CM

## 2019-12-01 DIAGNOSIS — M10072 Idiopathic gout, left ankle and foot: Secondary | ICD-10-CM

## 2019-12-01 MED ORDER — MONTELUKAST SODIUM 10 MG PO TABS: 10.0000 mg | ORAL_TABLET | Freq: Every day | ORAL | 3 refills | Status: DC

## 2019-12-01 NOTE — Progress Notes (Signed)
There were no vitals taken for this visit.   Subjective:    Patient ID: Jesus Guerrero, male    DOB: 29-Apr-1971, 48 y.o.   MRN: [id number]  HPI: Jesus Guerrero is a 48 y.o. male  No chief complaint on file.  Tested positive for COVID on 11/18. He has been to the ER 2x. Diarrhea is better, but still happening. No SOB. He notes that he is starting to feel better. He has not heard from the health department on his quarantine, but it should be over in 2 days.   HYPERLIPIDEMIA Hyperlipidemia status: stable Satisfied with current treatment?  yes Side effects:  no Medication compliance: excellent compliance Past cholesterol meds: atorvastatin Supplements: none Aspirin:  no The 10-year ASCVD risk score [name], et al., 2013) is: 2.8%   Values used to calculate the score:     Age: 30 years     Sex: Male     Is Non-Hispanic African American: No     Diabetic: No     Tobacco smoker: No     Systolic Blood Pressure: [id number] mmHg     Is BP treated: No     HDL Cholesterol: 41 mg/dL     Total Cholesterol: 158 mg/dL Chest pain:  no Coronary artery disease:  no  GOUT- no flares, feeling well. Tolerating allopurinol well.   Relevant past medical, surgical, family and social history reviewed and updated as indicated. Interim medical history since our last visit reviewed. Allergies and medications reviewed and updated.  Review of Systems  Constitutional: Positive for fatigue. Negative for activity change, appetite change, chills, diaphoresis, fever and unexpected weight change.  Respiratory: Negative.   Cardiovascular: Negative.   Musculoskeletal: Negative.   Psychiatric/Behavioral: Negative.     Per HPI unless specifically indicated above     Objective:    There were no vitals taken for this visit.  Wt Readings from Last 3 Encounters:  11/29/19 190 lb (86.2 kg)  11/24/19 190 lb (86.2 kg)  10/18/19 190 lb (86.2 kg)    Physical Exam Vitals signs and nursing note reviewed.   Pulmonary:     Effort: Pulmonary effort is normal. No respiratory distress.     Comments: Speaking in full sentences Neurological:     Mental Status: He is alert.  Psychiatric:        Mood and Affect: Mood normal.        Behavior: Behavior normal.        Thought Content: Thought content normal.        Judgment: Judgment normal.     Results for orders placed or performed during the hospital encounter of 11/29/19  Comprehensive metabolic panel  Result Value Ref Range   Sodium 142 135 - 145 mmol/L   Potassium 4.1 3.5 - 5.1 mmol/L   Chloride 106 98 - 111 mmol/L   CO2 24 22 - 32 mmol/L   Glucose, Bld 104 (H) 70 - 99 mg/dL   BUN 21 (H) 6 - 20 mg/dL   Creatinine, Ser 1.18 0.61 - 1.24 mg/dL   Calcium 9.1 8.9 - 10.3 mg/dL   Total Protein 8.7 (H) 6.5 - 8.1 g/dL   Albumin 4.1 3.5 - 5.0 g/dL   AST 26 15 - 41 U/L   ALT 20 0 - 44 U/L   Alkaline Phosphatase 67 38 - 126 U/L   Total Bilirubin 1.0 0.3 - 1.2 mg/dL   GFR calc non Af Amer >60 >60 mL/min   GFR  calc Af Amer >60 >60 mL/min   Anion gap 12 5 - 15  Lipase, blood  Result Value Ref Range   Lipase 34 11 - 51 U/L  CBC with Differential  Result Value Ref Range   WBC 8.5 4.0 - 10.5 K/uL   RBC 5.11 4.22 - 5.81 MIL/uL   Hemoglobin 15.5 13.0 - 17.0 g/dL   HCT 44.4 39.0 - 52.0 %   MCV 86.9 80.0 - 100.0 fL   MCH 30.3 26.0 - 34.0 pg   MCHC 34.9 30.0 - 36.0 g/dL   RDW 12.3 11.5 - 15.5 %   Platelets 216 150 - 400 K/uL   nRBC 0.0 0.0 - 0.2 %   Neutrophils Relative % 80 %   Neutro Abs 6.7 1.7 - 7.7 K/uL   Lymphocytes Relative 15 %   Lymphs Abs 1.3 0.7 - 4.0 K/uL   Monocytes Relative 5 %   Monocytes Absolute 0.5 0.1 - 1.0 K/uL   Eosinophils Relative 0 %   Eosinophils Absolute 0.0 0.0 - 0.5 K/uL   Basophils Relative 0 %   Basophils Absolute 0.0 0.0 - 0.1 K/uL   Immature Granulocytes 0 %   Abs Immature Granulocytes 0.03 0.00 - 0.07 K/uL  Urinalysis, Complete w Microscopic  Result Value Ref Range   Color, Urine YELLOW (A) YELLOW    APPearance HAZY (A) CLEAR   Specific Gravity, Urine 1.021 1.005 - 1.030   pH 5.0 5.0 - 8.0   Glucose, UA NEGATIVE NEGATIVE mg/dL   Hgb urine dipstick SMALL (A) NEGATIVE   Bilirubin Urine NEGATIVE NEGATIVE   Ketones, ur NEGATIVE NEGATIVE mg/dL   Protein, ur NEGATIVE NEGATIVE mg/dL   Nitrite NEGATIVE NEGATIVE   Leukocytes,Ua NEGATIVE NEGATIVE   RBC / HPF 0-5 0 - 5 RBC/hpf   WBC, UA 0-5 0 - 5 WBC/hpf   Bacteria, UA NONE SEEN NONE SEEN   Squamous Epithelial / LPF 0-5 0 - 5   Mucus PRESENT   Troponin I (High Sensitivity)  Result Value Ref Range   Troponin I (High Sensitivity) 4 <18 ng/L      Assessment & Plan:   Problem List Items Addressed This Visit      Musculoskeletal and Integument   Acute idiopathic gout involving toe of left foot    Tolerating the allopurinol well. Continue current regimen. Continue to monitor. Call with any concerns.         Other   Hyperlipidemia    Under good control on current regimen. Continue current regimen. Continue to monitor. Call with any concerns. Refills given. Labs drawn last visit and doing well.         Other Visit Diagnoses    COVID-19    -  Primary   Doing well. Continue to monitor. Call with any concerns.    Relevant Medications   ketoconazole (NIZORAL) 2 % cream       Follow up plan: Return in about 3 months (around 02/29/2020).    . This visit was completed via [employer] due to the restrictions of the COVID-19 pandemic. All issues as above were discussed and addressed. Physical exam was done as above through visual confirmation on D[employer]imity. If it was felt that the patient should be evaluated in the office, they were directed there. The patient verbally consented to this visit. . Location of the patient: home . Location of the provider: work . Those involved with this call:  . Provider: Park Liter, DO . CMA: Tiffany Reel, CMA . Front  Desk/Registration: Don Perking  . Time spent on call: 25 minutes with patient  face to face via video conference. More than 50% of this time was spent in counseling and coordination of care. 40 minutes total spent in review of patient's record and preparation of their chart.

## 2019-12-02 ENCOUNTER — Encounter: Payer: Self-pay | Admitting: Family Medicine

## 2019-12-02 NOTE — Assessment & Plan Note (Signed)
Tolerating the allopurinol well. Continue current regimen. Continue to monitor. Call with any concerns.

## 2019-12-02 NOTE — Assessment & Plan Note (Signed)
Under good control on current regimen. Continue current regimen. Continue to monitor. Call with any concerns. Refills given. Labs drawn last visit and doing well.

## 2019-12-04 ENCOUNTER — Encounter: Payer: Self-pay | Admitting: Family Medicine

## 2019-12-06 ENCOUNTER — Encounter: Payer: Self-pay | Admitting: Family Medicine

## 2019-12-07 ENCOUNTER — Encounter: Payer: Self-pay | Admitting: Family Medicine

## 2019-12-17 ENCOUNTER — Encounter: Payer: Self-pay | Admitting: Family Medicine

## 2019-12-18 ENCOUNTER — Encounter: Payer: Self-pay | Admitting: Family Medicine

## 2019-12-20 ENCOUNTER — Encounter: Payer: Self-pay | Admitting: Family Medicine

## 2019-12-21 ENCOUNTER — Encounter: Payer: Self-pay | Admitting: Family Medicine

## 2019-12-22 NOTE — Telephone Encounter (Signed)
LVM to make appoitment.

## 2019-12-23 ENCOUNTER — Encounter: Payer: Self-pay | Admitting: Family Medicine

## 2019-12-27 ENCOUNTER — Encounter: Payer: Self-pay | Admitting: Family Medicine

## 2020-01-02 ENCOUNTER — Encounter: Payer: Self-pay | Admitting: Family Medicine

## 2020-01-04 ENCOUNTER — Encounter: Payer: Self-pay | Admitting: Family Medicine

## 2020-01-06 ENCOUNTER — Encounter: Payer: Self-pay | Admitting: Family Medicine

## 2020-01-13 ENCOUNTER — Encounter: Payer: Self-pay | Admitting: Family Medicine

## 2020-01-20 ENCOUNTER — Encounter: Payer: Self-pay | Admitting: Family Medicine

## 2020-01-30 ENCOUNTER — Encounter: Payer: Self-pay | Admitting: Family Medicine

## 2020-02-03 ENCOUNTER — Encounter: Payer: Self-pay | Admitting: Family Medicine

## 2020-02-03 ENCOUNTER — Encounter (INDEPENDENT_AMBULATORY_CARE_PROVIDER_SITE_OTHER): Payer: Self-pay

## 2020-02-04 ENCOUNTER — Encounter: Payer: Self-pay | Admitting: Family Medicine

## 2020-02-04 ENCOUNTER — Other Ambulatory Visit: Payer: Self-pay | Admitting: Family Medicine

## 2020-02-04 NOTE — Telephone Encounter (Signed)
Requested medication (s) are due for refill today   Possibly  Requested medication (s) are on the active medication list  Listed on medication profile but not addressed since 2018 by Max T. [place], [place]  Future visit scheduled  Yes on 02/18/20  Routing to PCP for consideration   Requested Prescriptions  Pending Prescriptions Disp Refills   meloxicam (MOBIC) 15 MG tablet 90 tablet 3    Sig: Take 1 tablet (15 mg total) by mouth daily.      Analgesics:  COX2 Inhibitors Passed - 02/04/2020  4:48 PM      Passed - HGB in normal range and within 360 days    Hemoglobin  Date Value Ref Range Status  11/29/2019 15.5 13.0 - 17.0 g/dL Final  07/06/2019 15.0 13.0 - 17.7 g/dL Final          Passed - Cr in normal range and within 360 days    Creatinine  Date Value Ref Range Status  11/30/2014 1.24 0.60 - 1.30 mg/dL Final   Creatinine, Ser  Date Value Ref Range Status  11/29/2019 1.18 0.61 - 1.24 mg/dL Final          Passed - Patient is not pregnant      Passed - Valid encounter within last 12 months    Recent Outpatient Visits           2 months ago COVID-19   [facility], Megan P, DO   5 months ago Acute idiopathic gout involving toe of left foot   [place], Megan P, DO   7 months ago Chronic bilateral lower abdominal pain   [address], [place], DO   8 months ago Right ankle swelling   [place], [place], DO   8 months ago Mixed hyperlipidemia   [employer], [place], DO       Future Appointments             In 2 weeks Wynetta Emery, Barb Merino, DO [facility]

## 2020-02-04 NOTE — Telephone Encounter (Signed)
Copied from [place] [id number]. Topic: Quick Communication - Rx Refill/Question >> Feb 04, 2020  4:28 PM Rainey Pines A wrote: Medication: meloxicam (MOBIC) 15 MG tablet (Patient stated that he is completely out of medication)  Has the patient contacted their pharmacy? Yes (Agent: If no, request that the patient contact the pharmacy for the refill.) (Agent: If yes, when and what did the pharmacy advise?)Contact PCP  Preferred Pharmacy (with phone number or street name): [facility] [place] [facility] [address]  Phone:  [phone] Fax:  [phone]     Agent: Please be advised that RX refills may take up to 3 business days. We ask that you follow-up with your pharmacy.

## 2020-02-08 MED ORDER — MELOXICAM 15 MG PO TABS: 15.0000 mg | ORAL_TABLET | Freq: Every day | ORAL | 1 refills | Status: DC

## 2020-02-10 ENCOUNTER — Encounter: Payer: Self-pay | Admitting: Family Medicine

## 2020-02-11 ENCOUNTER — Encounter: Payer: Self-pay | Admitting: Family Medicine

## 2020-02-12 ENCOUNTER — Encounter: Payer: Self-pay | Admitting: Family Medicine

## 2020-02-13 ENCOUNTER — Encounter: Payer: Self-pay | Admitting: Family Medicine

## 2020-02-16 ENCOUNTER — Encounter: Payer: Self-pay | Admitting: Family Medicine

## 2020-02-18 ENCOUNTER — Encounter: Payer: Medicaid Other | Admitting: Family Medicine

## 2020-03-04 ENCOUNTER — Encounter: Payer: Self-pay | Admitting: Family Medicine

## 2020-03-07 ENCOUNTER — Encounter: Payer: Self-pay | Admitting: Family Medicine

## 2020-03-07 ENCOUNTER — Other Ambulatory Visit: Payer: Self-pay | Admitting: Family Medicine

## 2020-03-07 MED ORDER — ATORVASTATIN CALCIUM 20 MG PO TABS: 20.0000 mg | ORAL_TABLET | Freq: Every day | ORAL | 1 refills | Status: DC

## 2020-03-07 NOTE — Telephone Encounter (Signed)
Requested medication (s) are due for refill today: yes  Requested medication (s) are on the active medication list: yes  Last refill:  09/01/19  Future visit scheduled: yes  Notes to clinic:  cardiovascular: antilipid - statins failed  Requested Prescriptions  Pending Prescriptions Disp Refills   atorvastatin (LIPITOR) 20 MG tablet [Pharmacy Med Name: Atorvastatin Calcium 20 MG Oral Tablet] 90 tablet 0    Sig: Take 1 tablet by mouth once daily      Cardiovascular:  Antilipid - Statins Failed - 03/07/2020  4:03 PM      Failed - LDL in normal range and within 360 days    LDL Chol Calc (NIH)  Date Value Ref Range Status  09/01/2019 75 0 - 99 mg/dL Final          Failed - Triglycerides in normal range and within 360 days    Triglycerides  Date Value Ref Range Status  09/01/2019 260 (H) 0 - 149 mg/dL Final          Passed - Total Cholesterol in normal range and within 360 days    Cholesterol, Total  Date Value Ref Range Status  09/01/2019 158 100 - 199 mg/dL Final          Passed - HDL in normal range and within 360 days    HDL  Date Value Ref Range Status  09/01/2019 41 >39 mg/dL Final          Passed - Patient is not pregnant      Passed - Valid encounter within last 12 months    Recent Outpatient Visits           3 months ago COVID-19   [employer], Megan P, DO   6 months ago Acute idiopathic gout involving toe of left foot   [place], Megan P, DO   8 months ago Chronic bilateral lower abdominal pain   [facility] [place], [place], DO   9 months ago Right ankle swelling   [facility] [place], [place], DO   9 months ago Mixed hyperlipidemia   [employer], [place], DO       Future Appointments             In 3 weeks Wynetta Emery, Barb Merino, DO [employer] [facility], [facility]

## 2020-03-10 ENCOUNTER — Encounter: Payer: Self-pay | Admitting: Family Medicine

## 2020-03-12 ENCOUNTER — Encounter: Payer: Self-pay | Admitting: Family Medicine

## 2020-03-17 ENCOUNTER — Other Ambulatory Visit: Payer: Self-pay

## 2020-03-17 MED ORDER — FLUCONAZOLE 200 MG PO TABS: 200.0000 mg | ORAL_TABLET | Freq: Two times a day (BID) | ORAL | 0 refills | Status: DC

## 2020-03-17 NOTE — Telephone Encounter (Signed)
Patient advised of labs in legacy EMR system.

## 2020-03-19 ENCOUNTER — Encounter: Payer: Self-pay | Admitting: Family Medicine

## 2020-03-19 ENCOUNTER — Encounter (INDEPENDENT_AMBULATORY_CARE_PROVIDER_SITE_OTHER): Payer: Self-pay

## 2020-03-20 ENCOUNTER — Encounter: Payer: Self-pay | Admitting: Family Medicine

## 2020-03-21 ENCOUNTER — Encounter: Payer: Self-pay | Admitting: Family Medicine

## 2020-03-22 ENCOUNTER — Encounter: Payer: Self-pay | Admitting: Dermatology

## 2020-03-24 ENCOUNTER — Encounter: Payer: Self-pay | Admitting: Dermatology

## 2020-03-24 ENCOUNTER — Ambulatory Visit: Payer: Medicaid Other | Admitting: Dermatology

## 2020-03-24 ENCOUNTER — Other Ambulatory Visit: Payer: Self-pay

## 2020-03-24 DIAGNOSIS — L209 Atopic dermatitis, unspecified: Secondary | ICD-10-CM | POA: Diagnosis not present

## 2020-03-24 DIAGNOSIS — B350 Tinea barbae and tinea capitis: Secondary | ICD-10-CM

## 2020-03-24 MED ORDER — DUPILUMAB 300 MG/2ML ~~LOC~~ SOSY
300.0000 mg | PREFILLED_SYRINGE | SUBCUTANEOUS | Status: DC
  Administered 2020-03-24 – 2021-02-08 (×11): 300 mg via SUBCUTANEOUS

## 2020-03-24 NOTE — Progress Notes (Signed)
   Follow-Up Visit   Subjective  Jesus Guerrero is a 49 y.o. male who presents for the following: Follow-up (Atopic Dermatitis, here today for Dupixent injection. Also using [place].Some improvement. ) and tinea capitis (Scalp, using ketoconazole shampoo. Taking Diflucan for x 5 days. Still itching. ).  The following portions of the chart were reviewed this encounter and updated as appropriate: Tobacco  Allergies  Meds  Problems  Med Hx  Surg Hx  Fam Hx      Review of Systems: No other skin or systemic complaints.  Objective  Well appearing patient in no apparent distress; mood and affect are within normal limits.  A focused examination was performed including scalp, hands. Relevant physical exam findings are noted in the Assessment and Plan.  Objective  Hands: Scaly erythematous papules and patches with dyspigmentation.  Objective  Scalp: Erythema with minimal scale vertex scalp.   Assessment & Plan  Atopic dermatitis, unspecified type Hands  Improving but still flared. Cont Eucrisa BID Cont Dupixent 300mg /mL SQ Q2wks.Tolerating well, no reaction after injections. Seasonal allergy but no increase in eye symptoms. No cold sores.   Dupixent 300mg /mL injected SQ L upper arm. Pt tolerated well. Sample used. Lot # [id number]   Exp 10/30/21  Patient will bring Dupixent to next appt and we will have shipping address changed to our office per pts request.   dupilumab (DUPIXENT) prefilled syringe 300 mg - Hands  Tinea capitis Scalp  Improving. Still symptomatic.   Will recheck blood work in 2 weeks. Cont fluconazole 200mg  2 PO QD. Pt tolerating well.  Cont ketoconazole 2% shampoo 3x/weekly.   Advised itch should improve as he continues treatment.  Return in about 2 weeks (around 04/07/2020) for [place].   [name], RMA, am acting as scribe for [name], MD .  Documentation: I have reviewed the above documentation for accuracy and completeness, and I  agree with the above.  [name], MD

## 2020-03-29 ENCOUNTER — Other Ambulatory Visit: Payer: Self-pay

## 2020-03-29 ENCOUNTER — Encounter: Payer: Self-pay | Admitting: Family Medicine

## 2020-03-29 ENCOUNTER — Ambulatory Visit (INDEPENDENT_AMBULATORY_CARE_PROVIDER_SITE_OTHER): Payer: Medicaid Other | Admitting: Family Medicine

## 2020-03-29 VITALS — BP 162/81 | HR 73 | Temp 98.6°F | Ht 65.55 in | Wt 193.0 lb

## 2020-03-29 DIAGNOSIS — E782 Mixed hyperlipidemia: Secondary | ICD-10-CM

## 2020-03-29 DIAGNOSIS — Z0000 Encounter for general adult medical examination without abnormal findings: Secondary | ICD-10-CM | POA: Diagnosis not present

## 2020-03-29 DIAGNOSIS — E663 Overweight: Secondary | ICD-10-CM

## 2020-03-29 DIAGNOSIS — N411 Chronic prostatitis: Secondary | ICD-10-CM | POA: Diagnosis not present

## 2020-03-29 DIAGNOSIS — M10072 Idiopathic gout, left ankle and foot: Secondary | ICD-10-CM

## 2020-03-29 LAB — UA/M W/RFLX CULTURE, ROUTINE
Bilirubin, UA: NEGATIVE
Glucose, UA: NEGATIVE
Leukocytes,UA: NEGATIVE
Nitrite, UA: NEGATIVE
Specific Gravity, UA: 1.02 (ref 1.005–1.030)
Urobilinogen, Ur: 0.2 mg/dL (ref 0.2–1.0)
pH, UA: 5.5 (ref 5.0–7.5)

## 2020-03-29 LAB — BAYER DCA HB A1C WAIVED: HB A1C (BAYER DCA - WAIVED): 5.5 % (ref <7.0)

## 2020-03-29 LAB — MICROSCOPIC EXAMINATION
Bacteria, UA: NONE SEEN
WBC, UA: NONE SEEN /hpf (ref 0–5)

## 2020-03-29 MED ORDER — HYDROXYZINE HCL 10 MG PO TABS: ORAL_TABLET | ORAL | 6 refills | Status: DC

## 2020-03-29 MED ORDER — FLUTICASONE PROPIONATE 50 MCG/ACT NA SUSP: 2.0000 | Freq: Every day | NASAL | 12 refills | Status: DC
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
## 2020-03-29 NOTE — Assessment & Plan Note (Signed)
Labs drawn today

## 2020-03-29 NOTE — Patient Instructions (Addendum)
We are recommending the vaccine to everyone who has not had an allergic reaction to any of the components of the vaccine. If you have specific questions about the vaccine, please bring them up with your health care provider to discuss them.   We will likely not be getting the vaccine in the office for the first rounds of vaccinations. The way they are releasing the vaccines is going to be through the health systems (like [place], [place], [facility], [facility]), through your county health department, or through the pharmacies.   [facility] Department is giving vaccines to those 65+ and Health Care Workers Teachers and [place] providers start 02/24/20, Essential workers start 3/10 and those with co-morbidities start 03/23/20 Call [phone] to schedule  If you are 65+ you can get a vaccine through [facility] by signing up for an appointment.  You can sign up by going to: [email or web address].  You can get more information by going to: [email or web address]  [employer] next door is giving the [employer]- you can call [phone] or stop by there to schedule.   Health Maintenance, Male Adopting a healthy lifestyle and getting preventive care are important in promoting health and wellness. Ask your health care provider about:  The right schedule for you to have regular tests and exams.  Things you can do on your own to prevent diseases and keep yourself healthy. What should I know about diet, weight, and exercise? Eat a healthy diet   Eat a diet that includes plenty of vegetables, fruits, low-fat dairy products, and lean protein.  Do not eat a lot of foods that are high in solid fats, added sugars, or sodium. Maintain a healthy weight Body mass index (BMI) is a measurement that can be used to identify possible weight problems. It estimates body fat based on height and weight. Your health care provider can help determine your BMI and help you achieve or  maintain a healthy weight. Get regular exercise Get regular exercise. This is one of the most important things you can do for your health. Most adults should:  Exercise for at least 150 minutes each week. The exercise should increase your heart rate and make you sweat (moderate-intensity exercise).  Do strengthening exercises at least twice a week. This is in addition to the moderate-intensity exercise.  Spend less time sitting. Even light physical activity can be beneficial. Watch cholesterol and blood lipids Have your blood tested for lipids and cholesterol at 50 years of age, then have this test every 5 years. You may need to have your cholesterol levels checked more often if:  Your lipid or cholesterol levels are high.  You are older than 50 years of age.  You are at high risk for heart disease. What should I know about cancer screening? Many types of cancers can be detected early and may often be prevented. Depending on your health history and family history, you may need to have cancer screening at various ages. This may include screening for:  Colorectal cancer.  Prostate cancer.  Skin cancer.  Lung cancer. What should I know about heart disease, diabetes, and high blood pressure? Blood pressure and heart disease  High blood pressure causes heart disease and increases the risk of stroke. This is more likely to develop in people who have high blood pressure readings, are of African descent, or are overweight.  Talk with your health care provider about your target blood pressure readings.  Have your blood pressure checked: ? Every  3-5 years if you are 52-32 years of age. ? Every year if you are 81 years old or older.  If you are between the ages of 72 and 69 and are a current or former smoker, ask your health care provider if you should have a one-time screening for abdominal aortic aneurysm (AAA). Diabetes Have regular diabetes screenings. This checks your fasting blood  sugar level. Have the screening done:  Once every three years after age 12 if you are at a normal weight and have a low risk for diabetes.  More often and at a younger age if you are overweight or have a high risk for diabetes. What should I know about preventing infection? Hepatitis B If you have a higher risk for hepatitis B, you should be screened for this virus. Talk with your health care provider to find out if you are at risk for hepatitis B infection. Hepatitis C Blood testing is recommended for:  Everyone born from [birth year] through 1966.  Anyone with known risk factors for hepatitis C. Sexually transmitted infections (STIs)  You should be screened each year for STIs, including gonorrhea and chlamydia, if: ? You are sexually active and are younger than 50 years of age. ? You are older than 50 years of age and your health care provider tells you that you are at risk for this type of infection. ? Your sexual activity has changed since you were last screened, and you are at increased risk for chlamydia or gonorrhea. Ask your health care provider if you are at risk.  Ask your health care provider about whether you are at high risk for HIV. Your health care provider may recommend a prescription medicine to help prevent HIV infection. If you choose to take medicine to prevent HIV, you should first get tested for HIV. You should then be tested every 3 months for as long as you are taking the medicine. Follow these instructions at home: Lifestyle  Do not use any products that contain nicotine or tobacco, such as cigarettes, e-cigarettes, and chewing tobacco. If you need help quitting, ask your health care provider.  Do not use street drugs.  Do not share needles.  Ask your health care provider for help if you need support or information about quitting drugs. Alcohol use  Do not drink alcohol if your health care provider tells you not to drink.  If you drink alcohol: ? Limit how much  you have to 0-2 drinks a day. ? Be aware of how much alcohol is in your drink. In the [place] one drink equals one 12 oz bottle of beer (355 mL), one 5 oz glass of wine (148 mL), or one 1 oz glass of hard liquor (44 mL). General instructions  Schedule regular health, dental, and eye exams.  Stay current with your vaccines.  Tell your health care provider if: ? You often feel depressed. ? You have ever been abused or do not feel safe at home. Summary  Adopting a healthy lifestyle and getting preventive care are important in promoting health and wellness.  Follow your health care provider's instructions about healthy diet, exercising, and getting tested or screened for diseases.  Follow your health care provider's instructions on monitoring your cholesterol and blood pressure. This information is not intended to replace advice given to you by your health care provider. Make sure you discuss any questions you have with your health care provider. Document Revised: 12/10/2018 Document Reviewed: 12/10/2018 Elsevier Patient Education  2020 [employer].  Subungual Hematoma A subungual hematoma is a collection of blood under a fingernail or toenail. It can cause pain and a blue area under the nail. What are the causes? This condition is caused by an injury to a finger or toe that breaks a blood vessel under the nail. It can result from:  A hard, direct hit to a finger or toe (crush injury).  Pressure being put on a finger or toe over and over again, such as pressure on a toe from running. What are the signs or symptoms?   A blue or dark blue color under the nail.  Pain or throbbing in the injured area. How is this treated? Treatment is often not needed for this condition. The pain often goes away in a few days, and the dark color under the nail will go away as the nail grows. If treatment is needed, your doctor may:  Do a procedure to drain the blood from under the nail. This may be  done if you have a lot of pain or if a lot of blood collects under the nail.  Remove the nail. This may be done if there is a cut under the nail that needs stitches (sutures). Follow these instructions at home: Managing pain, stiffness, and swelling   If told, put ice on the area. ? Put ice in a plastic bag. ? Place a towel between your skin and the bag. ? Leave the ice on for 20 minutes, 2-3 times a day.  Raise (elevate) the injured finger or toe above the level of your heart while you are sitting or lying down. Doing this will help with pain and swelling. Injury care  Follow instructions from your doctor about how to take care of your injury. Make sure you: ? Change any bandage (dressing) as told by your doctor. ? Wash your hands with soap and water before you change your bandage. If you cannot use soap and water, use hand sanitizer. ? Leave stitches (sutures) in place. You may have these if your doctor fixed a cut under the nail. The stitches may need to stay in place for 2 weeks or longer.  If part of your nail falls off, gently trim the rest of the nail. General instructions  Take over-the-counter and prescription medicines only as told by your doctor.  Return to your normal activities as told by your doctor. Ask your doctor what activities are safe for you.  Keep all follow-up visits as told by your doctor. This is important. Contact a doctor if you have:  Pain that is not helped by medicine.  A fever.  Redness, swelling, or pain around your nail. Get help right away if you have:  Fluid, blood, or pus coming from your nail. Summary  A subungual hematoma is a collection of blood under a fingernail or toenail.  It can cause pain and a blue area under the nail.  Treatment is often not needed for this condition.  Raise the injured finger or toe above the level of your heart while you are sitting or lying down. This information is not intended to replace advice given  to you by your health care provider. Make sure you discuss any questions you have with your health care provider. Document Revised: 05/22/2018 Document Reviewed: 05/22/2018 Elsevier Patient Education  2020 [employer].

## 2020-03-30 ENCOUNTER — Encounter: Payer: Self-pay | Admitting: Family Medicine

## 2020-03-30 LAB — CBC WITH DIFFERENTIAL/PLATELET
Basophils Absolute: 0 10*3/uL (ref 0.0–0.2)
Basos: 1 %
EOS (ABSOLUTE): 0.1 10*3/uL (ref 0.0–0.4)
Eos: 1 %
Hematocrit: 45.7 % (ref 37.5–51.0)
Hemoglobin: 15.7 g/dL (ref 13.0–17.7)
Immature Grans (Abs): 0 10*3/uL (ref 0.0–0.1)
Immature Granulocytes: 0 %
Lymphocytes Absolute: 1.7 10*3/uL (ref 0.7–3.1)
Lymphs: 24 %
MCH: 31.1 pg (ref 26.6–33.0)
MCHC: 34.4 g/dL (ref 31.5–35.7)
MCV: 91 fL (ref 79–97)
Monocytes Absolute: 0.5 10*3/uL (ref 0.1–0.9)
Monocytes: 6 %
Neutrophils Absolute: 4.9 10*3/uL (ref 1.4–7.0)
Neutrophils: 68 %
Platelets: 188 10*3/uL (ref 150–450)
RBC: 5.05 x10E6/uL (ref 4.14–5.80)
RDW: 12.3 % (ref 11.6–15.4)
WBC: 7.3 10*3/uL (ref 3.4–10.8)

## 2020-03-30 LAB — COMPREHENSIVE METABOLIC PANEL
ALT: 18 IU/L (ref 0–44)
AST: 22 IU/L (ref 0–40)
Albumin/Globulin Ratio: 1.7 (ref 1.2–2.2)
Albumin: 4.6 g/dL (ref 4.0–5.0)
Alkaline Phosphatase: 110 IU/L (ref 39–117)
BUN/Creatinine Ratio: 23 — ABNORMAL HIGH (ref 9–20)
BUN: 27 mg/dL — ABNORMAL HIGH (ref 6–24)
Bilirubin Total: 0.6 mg/dL (ref 0.0–1.2)
CO2: 18 mmol/L — ABNORMAL LOW (ref 20–29)
Calcium: 9.2 mg/dL (ref 8.7–10.2)
Chloride: 104 mmol/L (ref 96–106)
Creatinine, Ser: 1.2 mg/dL (ref 0.76–1.27)
GFR calc Af Amer: 81 mL/min/{1.73_m2} (ref >59)
GFR calc non Af Amer: 70 mL/min/{1.73_m2} (ref >59)
Globulin, Total: 2.7 g/dL (ref 1.5–4.5)
Glucose: 109 mg/dL — ABNORMAL HIGH (ref 65–99)
Potassium: 4.2 mmol/L (ref 3.5–5.2)
Sodium: 142 mmol/L (ref 134–144)
Total Protein: 7.3 g/dL (ref 6.0–8.5)

## 2020-03-30 LAB — LIPID PANEL W/O CHOL/HDL RATIO
Cholesterol, Total: 156 mg/dL (ref 100–199)
HDL: 36 mg/dL — ABNORMAL LOW (ref >39)
LDL Chol Calc (NIH): 74 mg/dL (ref 0–99)
Triglycerides: 284 mg/dL — ABNORMAL HIGH (ref 0–149)
VLDL Cholesterol Cal: 46 mg/dL — ABNORMAL HIGH (ref 5–40)

## 2020-03-30 LAB — PSA: Prostate Specific Ag, Serum: 0.5 ng/mL (ref 0.0–4.0)

## 2020-03-30 LAB — URIC ACID: Uric Acid: 8.3 mg/dL (ref 3.8–8.4)

## 2020-03-30 LAB — TSH: TSH: 2.95 u[IU]/mL (ref 0.450–4.500)

## 2020-03-31 ENCOUNTER — Encounter: Payer: Self-pay | Admitting: Family Medicine

## 2020-04-01 ENCOUNTER — Encounter: Payer: Self-pay | Admitting: Dermatology

## 2020-04-02 ENCOUNTER — Encounter: Payer: Self-pay | Admitting: Dermatology

## 2020-04-02 ENCOUNTER — Encounter: Payer: Self-pay | Admitting: Family Medicine

## 2020-04-03 ENCOUNTER — Encounter: Payer: Self-pay | Admitting: Dermatology

## 2020-04-04 ENCOUNTER — Other Ambulatory Visit: Payer: Self-pay

## 2020-04-04 ENCOUNTER — Telehealth: Payer: Self-pay

## 2020-04-04 DIAGNOSIS — K130 Diseases of lips: Secondary | ICD-10-CM

## 2020-04-04 MED ORDER — HYDROCORTISONE 2.5 % EX CREA: TOPICAL_CREAM | CUTANEOUS | 0 refills | Status: DC

## 2020-04-04 NOTE — Telephone Encounter (Signed)
Patient sent multiple messages thru MyChart about rash, scalp itching and cold sore on lips. I tried to call patient this afternoon but there was no answer. Dr. Laurence Ferrari reviewed messages along with photos sent by pt then I responded to him thru MyChart advising I was sending in [facility] 2.5% cream BID to use on his lips to [facility] for cheilitis. I also advised him if he needs something for itch at night to let us know. If he does we can send in hydroxyzine 25mg  1-2 po qhs prn.  Patient is scheduled this Thursday for follow up/Dupixent injection.

## 2020-04-05 ENCOUNTER — Encounter: Payer: Self-pay | Admitting: Family Medicine

## 2020-04-05 ENCOUNTER — Encounter: Payer: Self-pay | Admitting: Dermatology

## 2020-04-06 ENCOUNTER — Encounter: Payer: Self-pay | Admitting: Dermatology

## 2020-04-06 ENCOUNTER — Encounter: Payer: Self-pay | Admitting: Family Medicine

## 2020-04-07 ENCOUNTER — Encounter: Payer: Self-pay | Admitting: Dermatology

## 2020-04-07 ENCOUNTER — Other Ambulatory Visit: Payer: Self-pay

## 2020-04-07 ENCOUNTER — Ambulatory Visit: Payer: Medicaid Other | Admitting: Dermatology

## 2020-04-07 DIAGNOSIS — B350 Tinea barbae and tinea capitis: Secondary | ICD-10-CM | POA: Diagnosis not present

## 2020-04-07 DIAGNOSIS — L309 Dermatitis, unspecified: Secondary | ICD-10-CM

## 2020-04-07 DIAGNOSIS — R21 Rash and other nonspecific skin eruption: Secondary | ICD-10-CM

## 2020-04-07 MED ORDER — DUPILUMAB 300 MG/2ML ~~LOC~~ SOSY: 300.0000 mg | PREFILLED_SYRINGE | Freq: Once | SUBCUTANEOUS | Status: DC

## 2020-04-07 MED ORDER — DUPILUMAB 300 MG/2ML ~~LOC~~ SOSY
300.0000 mg | PREFILLED_SYRINGE | Freq: Once | SUBCUTANEOUS | Status: AC
  Administered 2020-04-07: 12:00:00 300 mg via SUBCUTANEOUS

## 2020-04-07 NOTE — Progress Notes (Addendum)
Follow-Up Visit   Subjective  Jesus Guerrero is a 49 y.o. male who presents for the following: Follow-up.   Follow up for atopic dermatitis. Patient is on [place] and is here for an injection today. Patient advises the itching has improved and that he is not using any topicals right now.  Patient sent message earlier this week that he was having some issues with his lips being chapped so HC 2.5% cream was sent in but patient did not pick up yet. He said the lips are doing better.  Scalp is still real itchy for patient. He is using ketoconazole 2% shampoo every day, letting it stay on for 10 minutes before washing out. He is still taking fluconazole 200mg  BID. Rash has develops on patients leg, present for about 1 week. Patient advises it does itch but he has not treated them.   The following portions of the chart were reviewed this encounter and updated as appropriate:     Review of Systems: No other skin or systemic complaints.  Objective  Well appearing patient in no apparent distress; mood and affect are within normal limits.  A focused examination was performed including scalp, face, hands, legs. Relevant physical exam findings are noted in the Assessment and Plan.  Objective  Right Hand - Anterior: Hands with erythema without scale, scaly pink plaques at bilateral lower legs  Objective  R medial calf: Pinpoint red macules and papules at bilateral lower legs  Images      Objective  Mid Parietal Scalp: Erythema of vertex scalp with 2 scars from prior excoriation  Assessment & Plan  Eczema, unspecified type Right Hand - Anterior  Chronic, Improving but not at goal  Continue dupixent injections. Tolerating well without conjunctivitis, HSV or injection site reactions  Start clobetasol twice a day as needed up to 2 wk for itchy areas on legs.   Continue hydrocortisone 2.5% ointment twice a day as needed up to 1 more week to lips and forehead.  Dupixent  300mg /23mL injected to L upper arm today. Pt tolerated well.  Lot [id number]    Exp 10-30-2021  Topical steroids (such as triamcinolone, fluocinolone, fluocinonide, mometasone, clobetasol, halobetasol, betamethasone, hydrocortisone) can cause thinning and lightening of the skin if they are used for too long in the same area. Your physician has selected the right strength medicine for your problem and area affected on the body. Please use your medication only as directed by your physician to prevent side effects.   dupilumab (DUPIXENT) prefilled syringe 300 mg - Right Hand - Anterior  Rash R medial calf  Punch biopsy of right lower leg to rule out leukocytoclastic vasculitis Urinalysis ordered today  Skin / nail biopsy - R medial calf Type of biopsy: punch   Informed consent: discussed and consent obtained   Timeout: patient name, date of birth, surgical site, and procedure verified   Patient was prepped and draped in usual sterile fashion: Area prepped with isopropyl alcohol. Anesthesia: the lesion was anesthetized in a standard fashion   Anesthetic:  1% lidocaine w/ epinephrine 1-100,000 buffered w/ 8.4% NaHCO3 Punch size:  2 mm Suture size:  4-0 Suture type: Prolene (polypropylene)   Hemostasis achieved with: suture and aluminum chloride   Outcome: patient tolerated procedure well   Post-procedure details: wound care instructions given   Additional details:  Mupirocin and a dressing applied  Urinalysis - R medial calf  Specimen 1 - Surgical pathology Differential Diagnosis: r/o leukocytoclastic vasculitis Check Margins: No New pinpoint  red macules and papules at bilateral lower legs  Tinea capitis Mid Parietal Scalp  Improving visibly but with significant pruritus  Will order hepatic function panel and pending wnl, switch to griseofulvin microsize 500 mg daily    Hepatic Function Panel - Mid Parietal Scalp  Return in about 2 weeks (around 04/21/2020) for follow up, suture  removal.   [name], RMA, am acting as scribe for [name], MD .

## 2020-04-07 NOTE — Patient Instructions (Addendum)
Shave Excision Benign Lesion Wound Care Instructions  . Leave the original bandage on for 24 hours if possible.  If the bandage becomes soaked or soiled before that time, it is OK to remove it and examine the wound.  A small amount of post-operative bleeding is normal.  If excessive bleeding occurs, remove the bandage, place gauze over the site and apply continuous pressure (no peeking) over the area for 20-30 minutes.  If this does not stop the bleeding, try again for 40 minutes.  If this does not work, please call our clinic as soon as possible (even if after-hours).    . Twice a day, cleanse the wound with soap and water.  If a thick crust develops you may use a Q-tip dipped into dilute hydrogen peroxide (mix 1:1 with water) to dissolve it.  Hydrogen peroxide can slow the healing process, so use it only as needed.  After washing, apply Vaseline jelly or Polysporin ointment.  For best healing, the wound should be covered with a layer of ointment at all times.  This may mean re-applying the ointment several times a day.  For open wounds, continue until it has healed.    . If you have any swelling, keep the area elevated.  . Some redness, tenderness and white or yellow material in the wound is normal healing.  If the area becomes very sore and red, or develops a thick yellow-green material (pus), it may be infected; please notify us.    . Wound healing continues for up to one year following surgery.  It is not unusual to experience pain in the scar from time to time during the interval.  If the pain becomes severe or the scar thickens, you should notify the office.  A slight amount of redness in a scar is expected for the first six months.  After six months, the redness subsides and the scar will soften and fade.  The color difference becomes less noticeable with time.  If there are any problems, return for a post-op surgery check at your earliest convenience.  . Please call our office for any questions  or concerns.   For face cleanser, recommend vanicream facial cleanser  For itchy areas on legs, use clobetasol twice a day as needed up to one week.  For itchy irritated areas at lips, use hydrocortisone 2.5% twice a day as needed up to one week.

## 2020-04-07 NOTE — Progress Notes (Signed)
Follow-Up Visit   Subjective  Jesus Guerrero is a 49 y.o. male who presents for the following: Follow-up.   Follow up for atopic dermatitis. Patient is on [place] and is here for an injection today. Patient advises the itching has improved and that he is not using any topicals right now.  Patient sent message earlier this week that he was having some issues with his lips being chapped so HC 2.5% cream was sent in but patient did not pick up yet. He said the lips are doing better.  Scalp is still real itchy for patient. He is using ketoconazole 2% shampoo every day, letting it stay on for 10 minutes before washing out. He is still taking fluconazole 200mg  BID. Rash has develops on patients leg, present for about 1 week. Patient advises it does itch but he has not treated them.   The following portions of the chart were reviewed this encounter and updated as appropriate:     Review of Systems: No other skin or systemic complaints.  Objective  Well appearing patient in no apparent distress; mood and affect are within normal limits.  A focused examination was performed including scalp, face, hands, legs. Relevant physical exam findings are noted in the Assessment and Plan.  Objective  Right Hand - Anterior: Hands with erythema without scale, scaly pink plaques at bilateral lower legs  Objective  R medial calf: Pinpoint red macules and papules at bilateral lower legs  Images      Objective  Mid Parietal Scalp: Erythema of vertex scalp with 2 scars from prior excoriation  Assessment & Plan  Eczema, unspecified type Right Hand - Anterior  Chronic, Improving but not at goal  Continue dupixent injections. Tolerating well without conjunctivitis, HSV or injection site reactions  Start clobetasol twice a day as needed up to 2 wk for itchy areas on legs.   Continue hydrocortisone 2.5% ointment twice a day as needed up to 1 more week to lips and forehead.  Dupixent  300mg /3mL injected to L upper arm today. Pt tolerated well.  Lot [id number]    Exp 10-30-2021  Topical steroids (such as triamcinolone, fluocinolone, fluocinonide, mometasone, clobetasol, halobetasol, betamethasone, hydrocortisone) can cause thinning and lightening of the skin if they are used for too long in the same area. Your physician has selected the right strength medicine for your problem and area affected on the body. Please use your medication only as directed by your physician to prevent side effects.   dupilumab (DUPIXENT) prefilled syringe 300 mg - Right Hand - Anterior  Rash R medial calf  Punch biopsy of right lower leg to rule out leukocytoclastic vasculitis Urinalysis ordered today  Skin / nail biopsy - R medial calf Type of biopsy: punch   Informed consent: discussed and consent obtained   Timeout: patient name, date of birth, surgical site, and procedure verified   Patient was prepped and draped in usual sterile fashion: Area prepped with isopropyl alcohol. Anesthesia: the lesion was anesthetized in a standard fashion   Anesthetic:  1% lidocaine w/ epinephrine 1-100,000 buffered w/ 8.4% NaHCO3 Punch size:  2 mm Suture size:  4-0 Suture type: Prolene (polypropylene)   Hemostasis achieved with: suture and aluminum chloride   Outcome: patient tolerated procedure well   Post-procedure details: wound care instructions given   Additional details:  Mupirocin and a dressing applied  Urinalysis - R medial calf  Specimen 1 - Surgical pathology Differential Diagnosis: r/o leukocytoclastic vasculitis Check Margins: No New pinpoint  red macules and papules at bilateral lower legs  Tinea capitis Mid Parietal Scalp  Improving visibly but with significant pruritus  Will order hepatic function panel and pending wnl, switch to griseofulvin microsize 500 mg daily    Hepatic Function Panel - Mid Parietal Scalp  Return in about 2 weeks (around 04/21/2020) for follow up, suture  removal.   [name], RMA, am acting as scribe for [name], MD .  Documentation: I have reviewed the above documentation for accuracy and completeness, and I agree with the above.  [name], MD

## 2020-04-08 ENCOUNTER — Encounter: Payer: Self-pay | Admitting: Dermatology

## 2020-04-08 LAB — URINALYSIS
Bilirubin, UA: NEGATIVE
Glucose, UA: NEGATIVE
Leukocytes,UA: NEGATIVE
Nitrite, UA: NEGATIVE
Protein,UA: NEGATIVE
RBC, UA: NEGATIVE
Specific Gravity, UA: 1.028 (ref 1.005–1.030)
Urobilinogen, Ur: 0.2 mg/dL (ref 0.2–1.0)
pH, UA: 5.5 (ref 5.0–7.5)

## 2020-04-08 LAB — HEPATIC FUNCTION PANEL
ALT: 20 IU/L (ref 0–44)
AST: 19 IU/L (ref 0–40)
Albumin: 4.8 g/dL (ref 4.0–5.0)
Alkaline Phosphatase: 111 IU/L (ref 39–117)
Bilirubin Total: 0.5 mg/dL (ref 0.0–1.2)
Bilirubin, Direct: 0.13 mg/dL (ref 0.00–0.40)
Total Protein: 7.4 g/dL (ref 6.0–8.5)

## 2020-04-08 NOTE — Progress Notes (Signed)
Labs ok. Please send in griseofulvin per note. MAs please call. Thank you!

## 2020-04-11 ENCOUNTER — Other Ambulatory Visit: Payer: Self-pay

## 2020-04-11 ENCOUNTER — Telehealth: Payer: Self-pay

## 2020-04-11 DIAGNOSIS — B350 Tinea barbae and tinea capitis: Secondary | ICD-10-CM

## 2020-04-11 MED ORDER — GRISEOFULVIN MICROSIZE 500 MG PO TABS: 500.0000 mg | ORAL_TABLET | Freq: Every day | ORAL | 0 refills | Status: DC

## 2020-04-11 NOTE — Telephone Encounter (Signed)
Left patient message advising his labs are ok and we will send in griseofulvin to [facility].

## 2020-04-12 ENCOUNTER — Encounter: Payer: Self-pay | Admitting: Dermatology

## 2020-04-12 ENCOUNTER — Encounter: Payer: Self-pay | Admitting: Family Medicine

## 2020-04-12 NOTE — Progress Notes (Signed)
Biopsy results from leg show SUPERFICIAL TO MID PERIVASCULAR MIXED INFILTRATE WITH FOCAL INTERFACE DERMATITIS suggestive of pigmented and purpurpic dermatosis (benign and not dangerous). The urinalysis is also unremarkable. No treatment is needed at this time.

## 2020-04-14 ENCOUNTER — Encounter: Payer: Self-pay | Admitting: Dermatology

## 2020-04-14 ENCOUNTER — Encounter: Payer: Self-pay | Admitting: Family Medicine

## 2020-04-15 ENCOUNTER — Encounter: Payer: Self-pay | Admitting: Family Medicine

## 2020-04-21 ENCOUNTER — Encounter: Payer: Self-pay | Admitting: Dermatology

## 2020-04-22 ENCOUNTER — Ambulatory Visit: Payer: Medicaid Other | Admitting: Dermatology

## 2020-04-22 ENCOUNTER — Other Ambulatory Visit: Payer: Self-pay

## 2020-04-22 DIAGNOSIS — L308 Other specified dermatitis: Secondary | ICD-10-CM | POA: Diagnosis not present

## 2020-04-22 DIAGNOSIS — L817 Pigmented purpuric dermatosis: Secondary | ICD-10-CM

## 2020-04-22 DIAGNOSIS — K120 Recurrent oral aphthae: Secondary | ICD-10-CM | POA: Diagnosis not present

## 2020-04-22 DIAGNOSIS — B350 Tinea barbae and tinea capitis: Secondary | ICD-10-CM

## 2020-04-22 DIAGNOSIS — K130 Diseases of lips: Secondary | ICD-10-CM

## 2020-04-22 MED ORDER — CLOBETASOL PROPIONATE 0.05 % EX OINT: TOPICAL_OINTMENT | CUTANEOUS | 1 refills | Status: DC

## 2020-04-22 NOTE — Patient Instructions (Signed)
Start Dr. Luvenia Heller cortibalm four times a day to the lip. Also apply vaseline several times a day to crack.

## 2020-04-22 NOTE — Progress Notes (Signed)
   Follow-Up Visit   Subjective  Jesus Guerrero is a 49 y.o. male who presents for the following: Eczema, tinea capitis and rash at legs  Pt here for f/u tinea capitis. He reports scalp is starting to feel better with less itch. He was unable to get griseofulvin due to insurance so has been continuing the fluconazole.   His eczema continues to improve though he still has active areas of the hands.   He inquires what he should do about the rash on his legs.   The following portions of the chart were reviewed this encounter and updated as appropriate:  Tobacco  Allergies  Meds  Problems  Med Hx  Surg Hx  Fam Hx      Review of Systems:  No other skin or systemic complaints except as noted in HPI or Assessment and Plan.  Objective  Well appearing patient in no apparent distress; mood and affect are within normal limits.  A focused examination was performed including face, scalp, lips, tongue, bilateral hands, bilateral arms, bilateral lower legs. Relevant physical exam findings are noted in the Assessment and Plan.  Objective  Left Mid Tongue: Ulcer left lateral tongue  Objective  Mid Lower Vermilion Lip: Fissure left lower mucosal lip  Objective  Left Upper Arm - Anterior: Scaly erythematous papules and patches +/- dyspigmentation, lichenification, excoriations.   Objective  lower legs,abdomen: Pink and red macules and papules   Objective  mid parietal scalp: Mild erythema, improved   Assessment & Plan  Aphthous ulcer Left Mid Tongue  Favor aphthous ulcer. No history of ulcers at the area.  Start clobetasol 0.05% gel three times a day until ulcer is no longer painful, apply with the finger, holding the finger on the sore for 1 minute before removing it  Can also use OTC orajel as directed on package to help with temporary pain relief  clobetasol ointment (TEMOVATE) 0.05 % - Left Mid Tongue  Cheilitis Mid Lower Vermilion Lip  Recommend Dr. Luvenia Heller  cortibalm (1% hydrocortisone lip balm) four times a day as needed to help heal this area. Can also apply vaseline several times a day  Other Related Medications hydrocortisone 2.5 % cream  Other eczema Left Upper Arm - Anterior  Chronic, Improving, not yet at goal  Cont Eucrisa ointment twice a day to affected areas  Cont Dupixent injection 150mg  every 2 weeks. Patient tolerating well.  Dupilumab (Dupixent) is a treatment given by injection for adults with moderate-to-severe atopic dermatitis. It is given as 2 injections at the first dose followed by 1 injection ever 2 weeks thereafter.  Potential side effects include allergic reaction, herpes infections, injection site reactions and conjunctivitis (inflammation of the eyes).   Pigmented purpuric dermatosis lower legs,abdomen  Biopsy results discussed  Benign. Observe. Suture removal performed   Tinea capitis mid parietal scalp  Improving. Pruritus also improving. Cont Fluconazole tablets as directed (insurance would not cover griseofulvin).   Return in about 2 weeks (around 05/06/2020).  I, Marye Round, CMA, am acting as scribe for [name], MD .  Documentation: I have reviewed the above documentation for accuracy and completeness, and I agree with the above.  [name], MD

## 2020-04-23 ENCOUNTER — Other Ambulatory Visit: Payer: Self-pay

## 2020-04-24 ENCOUNTER — Encounter: Payer: Self-pay | Admitting: Dermatology

## 2020-04-27 ENCOUNTER — Encounter: Payer: Self-pay | Admitting: Family Medicine

## 2020-04-28 ENCOUNTER — Encounter: Payer: Self-pay | Admitting: Family Medicine

## 2020-05-04 ENCOUNTER — Other Ambulatory Visit: Payer: Self-pay

## 2020-05-04 ENCOUNTER — Ambulatory Visit: Payer: Medicaid Other | Admitting: Dermatology

## 2020-05-04 DIAGNOSIS — L309 Dermatitis, unspecified: Secondary | ICD-10-CM | POA: Diagnosis not present

## 2020-05-04 DIAGNOSIS — B350 Tinea barbae and tinea capitis: Secondary | ICD-10-CM | POA: Diagnosis not present

## 2020-05-04 DIAGNOSIS — L209 Atopic dermatitis, unspecified: Secondary | ICD-10-CM

## 2020-05-04 DIAGNOSIS — Z79899 Other long term (current) drug therapy: Secondary | ICD-10-CM

## 2020-05-04 DIAGNOSIS — K120 Recurrent oral aphthae: Secondary | ICD-10-CM

## 2020-05-04 NOTE — Progress Notes (Signed)
   Follow-Up Visit   Subjective  Jesus Guerrero is a 49 y.o. male who presents for the following: Eczema (Patient is here to day for a Dupixent injection. ) and Tinea Capitis (Scalp. Patient states that Scalp is not healed, has not stopped picking at scalp).   Eczema (2 weeks f/u Eczema/Atopic dermatitis, pt here for Dupixent injection) and Tinea capitis (scalp, pt took a round of fluconazole).  The following portions of the chart were reviewed this encounter and updated as appropriate:     Review of Systems:  No other skin or systemic complaints except as noted in HPI or Assessment and Plan.  Objective  Well appearing patient in no apparent distress; mood and affect are within normal limits.  A focused examination was performed including Scalp and Left upper arm. Relevant physical exam findings are noted in the Assessment and Plan.  Objective  Left Mid Tongue: Ulcer left lateral tongue  Objective  Left Upper Arm - Anterior, Right 3rd Finger Metacarpophalangeal Joint, Scalp: Mild erythema on b/l pip joints, wrists  Objective  Scalp: Small patch of alopecia at vertex with shortened hairs.  No erythema/scale   Assessment & Plan    Aphthous ulcer of tongue Left Mid Tongue  Some improvement, but not healed   Continue clobetasol 0.05% gel three times a day until ulcer is no longer painful, apply with the finger, holding the finger on the sore for 1 minute before removing it   Can also use OTC orajel as directed on package to help with temporary pain relief   clobetasol ointment (TEMOVATE) 0.05 % - Left Mid Tongue  Eczema, unspecified type (3) Left Upper Arm - Anterior; Right 3rd Finger Metacarpophalangeal Joint; Scalp  Improved Cont Eucrisa ointment twice a day to aas body   Cont Dupixent injection 150mg  every 2 weeks   Received Dupixent injection 300mg /mL  today in Left deltoid, patient tolerated well Lot [id number] Exp 10/30/2021  Tinea capitis Scalp  Appears  resolved post treatment fluconazole 200 mg bid x 6 weeks, but with 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 Continue Ketoconazole shampoo 2 % on scalp lather and leave in for 5 to 10 mins. then rinse.  Patient already has at home. Avoid picking.  Long term medication management. Return in about 2 weeks (around 05/18/2020) for Dupixent Injection.  Marene Lenz, CMA, am acting as scribe for [name], MD .  Documentation: I have reviewed the above documentation for accuracy and completeness, and I agree with the above.  [name], MD

## 2020-05-06 ENCOUNTER — Encounter: Payer: Self-pay | Admitting: Family Medicine

## 2020-05-07 ENCOUNTER — Encounter: Payer: Self-pay | Admitting: Dermatology

## 2020-05-08 ENCOUNTER — Encounter: Payer: Self-pay | Admitting: Family Medicine

## 2020-05-12 ENCOUNTER — Encounter: Payer: Self-pay | Admitting: Dermatology

## 2020-05-13 ENCOUNTER — Other Ambulatory Visit: Payer: Self-pay | Admitting: Dermatology

## 2020-05-17 ENCOUNTER — Encounter: Payer: Self-pay | Admitting: Dermatology

## 2020-05-17 ENCOUNTER — Encounter: Payer: Self-pay | Admitting: Family Medicine

## 2020-05-18 ENCOUNTER — Other Ambulatory Visit: Payer: Self-pay

## 2020-05-18 ENCOUNTER — Ambulatory Visit: Payer: Medicaid Other

## 2020-05-18 DIAGNOSIS — L209 Atopic dermatitis, unspecified: Secondary | ICD-10-CM

## 2020-05-18 MED ORDER — DUPILUMAB 300 MG/2ML ~~LOC~~ SOAJ
300.0000 mg | Freq: Once | SUBCUTANEOUS | Status: AC
  Administered 2020-05-18: 300 mg via SUBCUTANEOUS

## 2020-05-18 NOTE — Patient Instructions (Signed)
Dupilumab injection What is this medicine? DUPILUMAB (doo PIL ue mab) is an injection used to treat certain patients with eczema, asthma, and sinus inflammation with nasal polyps. This medicine may be used for other purposes; ask your health care provider or pharmacist if you have questions. COMMON BRAND NAME(S): DUPIXENT What should I tell my health care provider before I take this medicine? They need to know if you have any of these conditions:  asthma  parasitic (helminth) infection  an unusual or allergic reaction to dupilumab, other medicines, foods, dyes, or preservatives  pregnant or trying to get pregnant  breast-feeding How should I use this medicine? This medicine is for injection under the skin. You will be taught how to prepare and give this medicine. Use exactly as directed. Take your medicine at regular intervals. Do not take your medicine more often than directed. It is important that you put your used needles and syringes in a special sharps container. Do not put them in a trash can. If you do not have a sharps container, call your pharmacist or healthcare provider to get one. Talk to your pediatrician regarding the use of this medicine in children. While this medicine may be prescribed for children as young as 6 years for selected conditions, precautions do apply. Overdosage: If you think you have taken too much of this medicine contact a poison control center or emergency room at once. NOTE: This medicine is only for you. Do not share this medicine with others. What if I miss a dose? If you miss a dose, take it as soon as you can if it is within 7 days from the missed dose. If it is more than 7 days from your last dose and you are on an every other week dosing schedule, skip the dose and wait to take the next dose on the original schedule. If it is more than 7 days from your last dose and you are on an every 4-week dosing schedule, take a dose as soon as possible and start a  new schedule based on this date. Do not take double or extra doses. What may interact with this medicine?  vaccines  warfarin This list may not describe all possible interactions. Give your health care provider a list of all the medicines, herbs, non-prescription drugs, or dietary supplements you use. Also tell them if you smoke, drink alcohol, or use illegal drugs. Some items may interact with your medicine. What should I watch for while using this medicine? Tell your doctor or healthcare professional if your symptoms do not start to get better or if they get worse. You should not receive certain vaccines while using this medicine. Dupilumab may prevent a vaccine from working. Talk to your health care provider if you need to receive a vaccine while using this medicine. What side effects may I notice from receiving this medicine? Side effects that you should report to your doctor or health care professional as soon as possible:  allergic reactions like skin rash, itching or hives, swelling of the face, lips, or tongue  changes in vision  eye pain or swelling Side effects that usually do not require medical attention (report these to your doctor or health care professional if they continue or are bothersome):  cold sores  dry or itching eyes  pain, redness, or irritation at site where injected This list may not describe all possible side effects. Call your doctor for medical advice about side effects. You may report side effects to FDA  at 1-800-FDA-1088. Where should I keep my medicine? Keep out of the reach of children. Store this medicine in the refrigerator between 2 and 8 degrees C (36 and 46 degrees F) until you are ready to prepare your injection; do not freeze. You may also store at room temperature for up to 14 days if needed. Do not store above 25 degrees C (77 degrees F) or expose to heat. Throw away any unused medicine after the expiration date. NOTE: This sheet is a summary.  It may not cover all possible information. If you have questions about this medicine, talk to your doctor, pharmacist, or health care provider.  2020 Elsevier/Gold Standard (2019-06-22 11:55:26)  

## 2020-05-18 NOTE — Progress Notes (Signed)
Patient here for nurse visit to inject Dupixent 300mg /5mL.   Dupixent Pen 300mg /20mL injected into right upper arm. Patient tolerated well.  Lot: [id number] EXP: 10/30/2021

## 2020-05-28 ENCOUNTER — Encounter: Payer: Self-pay | Admitting: Dermatology

## 2020-06-01 ENCOUNTER — Other Ambulatory Visit: Payer: Self-pay

## 2020-06-01 ENCOUNTER — Ambulatory Visit: Payer: Medicaid Other | Admitting: Dermatology

## 2020-06-01 DIAGNOSIS — Z79899 Other long term (current) drug therapy: Secondary | ICD-10-CM | POA: Diagnosis not present

## 2020-06-01 DIAGNOSIS — L280 Lichen simplex chronicus: Secondary | ICD-10-CM

## 2020-06-01 DIAGNOSIS — L308 Other specified dermatitis: Secondary | ICD-10-CM | POA: Diagnosis not present

## 2020-06-01 MED ORDER — CLOBETASOL PROPIONATE 0.05 % EX SOLN: CUTANEOUS | 1 refills | Status: DC

## 2020-06-01 MED ORDER — DUPILUMAB 300 MG/2ML ~~LOC~~ SOAJ
300.0000 mg | Freq: Once | SUBCUTANEOUS | Status: AC
  Administered 2020-06-01: 300 mg via SUBCUTANEOUS

## 2020-06-01 NOTE — Progress Notes (Signed)
   Follow-Up Visit   Subjective  Jesus Guerrero is a 49 y.o. male who presents for the following: Follow-up.  Patient here for 2 week follow up. He is being treated for eczema with Dupixent. Patient also being treated for tinea capitis and is using ketoconazole 2% shampoo daily, leaving on for 10 minutes before rinsing but still complains of itching, He finished his 6 week oral treatment course of fluconazole.  The following portions of the chart were reviewed this encounter and updated as appropriate:      Review of Systems:  No other skin or systemic complaints except as noted in HPI or Assessment and Plan.  Objective  Well appearing patient in no apparent distress; mood and affect are within normal limits.  A focused examination was performed including scalp, arms. Relevant physical exam findings are noted in the Assessment and Plan.  Objective  Scalp at vertex: 2.5cm patch of alopecia with erythema and skin thickening  Objective  Trunk, extremities: Light pink xerotic patches   Assessment & Plan  Lichen simplex chronicus Scalp at vertex  Cont ketoconazole 2% shampoo daily, letting sit 10 minutes before rinsing. Restart clobetasol solution 1-2 times daily as needed for itch.   Avoid picking/scratching/rubbing area   Intralesional injection - Scalp at vertex Location: vertex scalp  Informed Consent: Discussed risks (infection, pain, bleeding, bruising, thinning of the skin, loss of skin pigment, lack of resolution, and recurrence of lesion) and benefits of the procedure, as well as the alternatives. Informed consent was obtained. Preparation: The area was prepared a standard fashion.  Procedure Details: An intralesional injection was performed with Kenalog 5 mg/cc. 0.8 cc in total were injected.  Total number of injections: >7 Lot [id number] Exp: AUG 2022  Plan: The patient was instructed on post-op care. Recommend OTC analgesia as needed for pain.   clobetasol  (TEMOVATE) 0.05 % external solution - Scalp at vertex  Other eczema Trunk, extremities  Continue Dupixent 300mg /43mL SQ Q2 weeks Injection given today.   Long term medication management. Return in about 2 weeks (around 06/15/2020) for [name] injection with nurse.  [name], RMA, am acting as scribe for [name], MD .  Documentation: I have reviewed the above documentation for accuracy and completeness, and I agree with the above.  [name] MD

## 2020-06-09 ENCOUNTER — Encounter: Payer: Self-pay | Admitting: Dermatology

## 2020-06-12 ENCOUNTER — Encounter: Payer: Self-pay | Admitting: Family Medicine

## 2020-06-13 ENCOUNTER — Encounter: Payer: Self-pay | Admitting: Family Medicine

## 2020-06-13 ENCOUNTER — Other Ambulatory Visit: Payer: Self-pay | Admitting: Family Medicine

## 2020-06-13 NOTE — Telephone Encounter (Signed)
Requested Prescriptions  Pending Prescriptions Disp Refills   allopurinol (ZYLOPRIM) 100 MG tablet [Pharmacy Med Name: Allopurinol 100 MG Oral Tablet] 90 tablet 0    Sig: Take 1 tablet by mouth once daily     Endocrinology:  Gout Agents Passed - 06/13/2020  3:45 PM      Passed - Uric Acid in normal range and within 360 days    Uric Acid  Date Value Ref Range Status  03/29/2020 8.3 3.8 - 8.4 mg/dL Final    Comment:               Therapeutic target for gout patients: <6.0         Passed - Cr in normal range and within 360 days    Creatinine  Date Value Ref Range Status  11/30/2014 1.24 0.60 - 1.30 mg/dL Final   Creatinine, Ser  Date Value Ref Range Status  03/29/2020 1.20 0.76 - 1.27 mg/dL Final         Passed - Valid encounter within last 12 months    Recent Outpatient Visits          2 months ago Routine general medical examination at a health care facility   [facility], Megan P, DO   6 months ago COVID-19   [facility], Megan P, DO   9 months ago Acute idiopathic gout involving toe of left foot   [place], Megan P, DO   11 months ago Chronic bilateral lower abdominal pain   [place], DO   1 year ago Right ankle swelling   [place], [place], DO      Future Appointments            In 2 weeks Wynetta Emery, Barb Merino, DO [facility]

## 2020-06-14 MED ORDER — ALLOPURINOL 100 MG PO TABS: 100.0000 mg | ORAL_TABLET | Freq: Every day | ORAL | 1 refills | Status: DC

## 2020-06-15 ENCOUNTER — Ambulatory Visit (INDEPENDENT_AMBULATORY_CARE_PROVIDER_SITE_OTHER): Payer: Medicaid Other

## 2020-06-15 ENCOUNTER — Other Ambulatory Visit: Payer: Self-pay

## 2020-06-15 DIAGNOSIS — L209 Atopic dermatitis, unspecified: Secondary | ICD-10-CM | POA: Diagnosis not present

## 2020-06-15 NOTE — Patient Instructions (Signed)
Dupilumab injection What is this medicine? DUPILUMAB (doo PIL ue mab) is an injection used to treat certain patients with eczema, asthma, and sinus inflammation with nasal polyps. This medicine may be used for other purposes; ask your health care provider or pharmacist if you have questions. COMMON BRAND NAME(S): DUPIXENT What should I tell my health care provider before I take this medicine? They need to know if you have any of these conditions:  asthma  parasitic (helminth) infection  an unusual or allergic reaction to dupilumab, other medicines, foods, dyes, or preservatives  pregnant or trying to get pregnant  breast-feeding How should I use this medicine? This medicine is for injection under the skin. You will be taught how to prepare and give this medicine. Use exactly as directed. Take your medicine at regular intervals. Do not take your medicine more often than directed. It is important that you put your used needles and syringes in a special sharps container. Do not put them in a trash can. If you do not have a sharps container, call your pharmacist or healthcare provider to get one. Talk to your pediatrician regarding the use of this medicine in children. While this medicine may be prescribed for children as young as 6 years for selected conditions, precautions do apply. Overdosage: If you think you have taken too much of this medicine contact a poison control center or emergency room at once. NOTE: This medicine is only for you. Do not share this medicine with others. What if I miss a dose? If you miss a dose, take it as soon as you can if it is within 7 days from the missed dose. If it is more than 7 days from your last dose and you are on an every other week dosing schedule, skip the dose and wait to take the next dose on the original schedule. If it is more than 7 days from your last dose and you are on an every 4-week dosing schedule, take a dose as soon as possible and start a  new schedule based on this date. Do not take double or extra doses. What may interact with this medicine?  vaccines  warfarin This list may not describe all possible interactions. Give your health care provider a list of all the medicines, herbs, non-prescription drugs, or dietary supplements you use. Also tell them if you smoke, drink alcohol, or use illegal drugs. Some items may interact with your medicine. What should I watch for while using this medicine? Tell your doctor or healthcare professional if your symptoms do not start to get better or if they get worse. You should not receive certain vaccines while using this medicine. Dupilumab may prevent a vaccine from working. Talk to your health care provider if you need to receive a vaccine while using this medicine. What side effects may I notice from receiving this medicine? Side effects that you should report to your doctor or health care professional as soon as possible:  allergic reactions like skin rash, itching or hives, swelling of the face, lips, or tongue  changes in vision  eye pain or swelling Side effects that usually do not require medical attention (report these to your doctor or health care professional if they continue or are bothersome):  cold sores  dry or itching eyes  pain, redness, or irritation at site where injected This list may not describe all possible side effects. Call your doctor for medical advice about side effects. You may report side effects to FDA  at 1-800-FDA-1088. Where should I keep my medicine? Keep out of the reach of children. Store this medicine in the refrigerator between 2 and 8 degrees C (36 and 46 degrees F) until you are ready to prepare your injection; do not freeze. You may also store at room temperature for up to 14 days if needed. Do not store above 25 degrees C (77 degrees F) or expose to heat. Throw away any unused medicine after the expiration date. NOTE: This sheet is a summary.  It may not cover all possible information. If you have questions about this medicine, talk to your doctor, pharmacist, or health care provider.  2020 Elsevier/Gold Standard (2019-06-22 11:55:26)

## 2020-06-15 NOTE — Progress Notes (Signed)
Patient here today for 2 week Dupixent injection.   Dupixent 300mg /48mL injected into right upper arm. Patient tolerated well.  [id number] EXP: 10/30/2021

## 2020-06-17 ENCOUNTER — Encounter: Payer: Self-pay | Admitting: Dermatology

## 2020-06-17 ENCOUNTER — Encounter: Payer: Self-pay | Admitting: Family Medicine

## 2020-06-18 ENCOUNTER — Encounter: Payer: Self-pay | Admitting: Family Medicine

## 2020-06-20 ENCOUNTER — Encounter: Payer: Self-pay | Admitting: Dermatology

## 2020-06-20 ENCOUNTER — Encounter: Payer: Self-pay | Admitting: Family Medicine

## 2020-06-21 ENCOUNTER — Other Ambulatory Visit: Payer: Self-pay

## 2020-06-21 ENCOUNTER — Encounter: Payer: Self-pay | Admitting: Family Medicine

## 2020-06-21 MED ORDER — KETOCONAZOLE 2 % EX SHAM: MEDICATED_SHAMPOO | CUTANEOUS | 0 refills | Status: DC

## 2020-06-21 NOTE — Progress Notes (Signed)
Refill for Ketoconazole Shampoo.

## 2020-06-28 ENCOUNTER — Encounter: Payer: Self-pay | Admitting: Family Medicine

## 2020-06-29 ENCOUNTER — Encounter: Payer: Self-pay | Admitting: Dermatology

## 2020-06-29 ENCOUNTER — Other Ambulatory Visit: Payer: Self-pay

## 2020-06-29 ENCOUNTER — Ambulatory Visit: Payer: Medicaid Other | Admitting: Dermatology

## 2020-06-29 DIAGNOSIS — Z79899 Other long term (current) drug therapy: Secondary | ICD-10-CM

## 2020-06-29 DIAGNOSIS — L719 Rosacea, unspecified: Secondary | ICD-10-CM | POA: Diagnosis not present

## 2020-06-29 DIAGNOSIS — L209 Atopic dermatitis, unspecified: Secondary | ICD-10-CM | POA: Diagnosis not present

## 2020-06-29 DIAGNOSIS — L280 Lichen simplex chronicus: Secondary | ICD-10-CM | POA: Diagnosis not present

## 2020-06-29 MED ORDER — METRONIDAZOLE 0.75 % EX GEL: 1.0000 "application " | Freq: Two times a day (BID) | CUTANEOUS | 3 refills | Status: DC

## 2020-06-29 NOTE — Progress Notes (Signed)
   Follow-Up Visit   Subjective  Jesus Guerrero is a 49 y.o. male who presents for the following: Eczema.  Patient presents today for Atopic Dermatitis follow up and a Dupixent injection.  He is still itching in his scalp.  He also has bumps coming up on his forehead.  The following portions of the chart were reviewed this encounter and updated as appropriate:      Review of Systems:  No other skin or systemic complaints except as noted in HPI or Assessment and Plan.  Objective  Well appearing patient in no apparent distress; mood and affect are within normal limits.  A focused examination was performed including Scalp. Relevant physical exam findings are noted in the Assessment and Plan.  Objective  Mid Frontal Scalp: Patch of non scarring alopecia with some mild erythema and thickening of skin.  Objective  Mid Forehead: Few crusted pink papules    Objective  Left Upper Arm - Anterior: Clear today   Assessment & Plan  Lichen simplex chronicus Mid Frontal Scalp  Must stop scratching/picking/rubbing scalp or area will not clear  Intralesional steroid injection today  Continue Clobetasol solution qd/bid prn itch   clobetasol (TEMOVATE) 0.05 % external solution - Mid Frontal Scalp  Intralesional injection - Mid Frontal Scalp Location: Vertex Scalp  Informed Consent: Discussed risks (infection, pain, bleeding, bruising, thinning of the skin, loss of skin pigment, lack of resolution, and recurrence of lesion) and benefits of the procedure, as well as the alternatives. Informed consent was obtained. Preparation: The area was prepared a standard fashion.    Procedure Details: An intralesional injection was performed with Kenalog 5 mg/cc. 1.0 cc in total were injected.  Total number of injections: greater than 7  Plan: The patient was instructed on post-op care. Recommend OTC analgesia as needed for pain.  Lot [id number] Exp 08/2021  Rosacea Mid  Forehead  Will start Metro gel apply to affected area once to twice daily face as needed.  metroNIDAZOLE (METROGEL) 0.75 % gel - Mid Forehead  Atopic dermatitis, unspecified type Left Upper Arm - Anterior  Continue using Dupixent every 2 weeks Dupixent injection today given in Left arm   dupilumab (DUPIXENT) prefilled syringe 300 mg - Left Upper Arm - Anterior Long term medication management.  Return in about 2 weeks (around 07/13/2020) for Nurse, dupixent injection.  Marene Lenz, CMA, am acting as scribe for [name], MD .  Documentation: I have reviewed the above documentation for accuracy and completeness, and I agree with the above.  [name] MD

## 2020-06-29 NOTE — Patient Instructions (Signed)
Recommend daily broad spectrum sunscreen SPF 30+ to sun-exposed areas, reapply every 2 hours as needed. Call for new or changing lesions.  

## 2020-06-30 ENCOUNTER — Ambulatory Visit (INDEPENDENT_AMBULATORY_CARE_PROVIDER_SITE_OTHER): Payer: Medicaid Other | Admitting: Family Medicine

## 2020-06-30 ENCOUNTER — Encounter: Payer: Self-pay | Admitting: Family Medicine

## 2020-06-30 VITALS — BP 138/72 | HR 78 | Temp 98.4°F | Ht 64.96 in | Wt 190.0 lb

## 2020-06-30 DIAGNOSIS — M10072 Idiopathic gout, left ankle and foot: Secondary | ICD-10-CM

## 2020-06-30 DIAGNOSIS — E782 Mixed hyperlipidemia: Secondary | ICD-10-CM | POA: Diagnosis not present

## 2020-06-30 DIAGNOSIS — Z1159 Encounter for screening for other viral diseases: Secondary | ICD-10-CM | POA: Diagnosis not present

## 2020-06-30 NOTE — Assessment & Plan Note (Signed)
Under good control on current regimen. Continue current regimen. Continue to monitor. Call with any concerns. Refills up to date. Labs drawn today.  

## 2020-06-30 NOTE — Progress Notes (Signed)
BP 138/72 (BP Location: Left Arm, Cuff Size: Normal)   Pulse 78   Temp 98.4 F (36.9 C) (Oral)   Ht 5' 4.96" (1.65 m)   Wt 190 lb (86.2 kg)   SpO2 97%   BMI 31.66 kg/m    Subjective:    Patient ID: Jesus Guerrero, male    DOB: 08/18/1971, 49 y.o.   MRN: [id number]  HPI: Jesus Guerrero is a 49 y.o. male  Chief Complaint  Patient presents with  . Weight Check  . Hyperlipidemia   HYPERLIPIDEMIA Hyperlipidemia status: stable Satisfied with current treatment?  yes Side effects:  no Medication compliance: excellent compliance Past cholesterol meds: atorvastatin Supplements: none Aspirin:  no The 10-year ASCVD risk score [name], et al., 2013) is: 3.9%   Values used to calculate the score:     Age: 52 years     Sex: Male     Is Non-Hispanic African American: No     Diabetic: No     Tobacco smoker: No     Systolic Blood Pressure: 518 mmHg     Is BP treated: No     HDL Cholesterol: 36 mg/dL     Total Cholesterol: 156 mg/dL Chest pain:  no Coronary artery disease:  no  No gout flares. Tolerating his medicine well.   Relevant past medical, surgical, family and social history reviewed and updated as indicated. Interim medical history since our last visit reviewed. Allergies and medications reviewed and updated.  Review of Systems  Constitutional: Negative.   Respiratory: Negative.   Cardiovascular: Negative.   Gastrointestinal: Negative.   Musculoskeletal: Negative.   Skin: Negative.   Psychiatric/Behavioral: Negative.     Per HPI unless specifically indicated above     Objective:    BP 138/72 (BP Location: Left Arm, Cuff Size: Normal)   Pulse 78   Temp 98.4 F (36.9 C) (Oral)   Ht 5' 4.96" (1.65 m)   Wt 190 lb (86.2 kg)   SpO2 97%   BMI 31.66 kg/m   Wt Readings from Last 3 Encounters:  06/30/20 190 lb (86.2 kg)  03/29/20 193 lb (87.5 kg)  11/29/19 190 lb (86.2 kg)    Physical Exam Vitals and nursing note reviewed.  Constitutional:       General: He is not in acute distress.    Appearance: Normal appearance. He is not ill-appearing, toxic-appearing or diaphoretic.  HENT:     Head: Normocephalic and atraumatic.     Right Ear: External ear normal.     Left Ear: External ear normal.     Nose: Nose normal.     Mouth/Throat:     Mouth: Mucous membranes are moist.     Pharynx: Oropharynx is clear.  Eyes:     General: No scleral icterus.       Right eye: No discharge.        Left eye: No discharge.     Extraocular Movements: Extraocular movements intact.     Conjunctiva/sclera: Conjunctivae normal.     Pupils: Pupils are equal, round, and reactive to light.  Cardiovascular:     Rate and Rhythm: Normal rate and regular rhythm.     Pulses: Normal pulses.     Heart sounds: Normal heart sounds. No murmur heard.  No friction rub. No gallop.   Pulmonary:     Effort: Pulmonary effort is normal. No respiratory distress.     Breath sounds: Normal breath sounds. No stridor. No wheezing, rhonchi or  rales.  Chest:     Chest wall: No tenderness.  Musculoskeletal:        General: Normal range of motion.     Cervical back: Normal range of motion and neck supple.  Skin:    General: Skin is warm and dry.     Capillary Refill: Capillary refill takes less than 2 seconds.     Coloration: Skin is not jaundiced or pale.     Findings: No bruising, erythema, lesion or rash.  Neurological:     General: No focal deficit present.     Mental Status: He is alert and oriented to person, place, and time. Mental status is at baseline.  Psychiatric:        Mood and Affect: Mood normal.        Behavior: Behavior normal.        Thought Content: Thought content normal.        Judgment: Judgment normal.     Results for orders placed or performed in visit on 04/07/20  Hepatic Function Panel  Result Value Ref Range   Total Protein 7.4 6.0 - 8.5 g/dL   Albumin 4.8 4.0 - 5.0 g/dL   Bilirubin Total 0.5 0.0 - 1.2 mg/dL   Bilirubin, Direct 0.13  0.00 - 0.40 mg/dL   Alkaline Phosphatase 111 39 - 117 IU/L   AST 19 0 - 40 IU/L   ALT 20 0 - 44 IU/L  Urinalysis  Result Value Ref Range   Specific Gravity, UA 1.028 1.005 - 1.030   pH, UA 5.5 5.0 - 7.5   Color, UA Yellow Yellow   Appearance Ur Clear Clear   Leukocytes,UA Negative Negative   Protein,UA Negative Negative/Trace   Glucose, UA Negative Negative   Ketones, UA Trace (A) Negative   RBC, UA Negative Negative   Bilirubin, UA Negative Negative   Urobilinogen, Ur 0.2 0.2 - 1.0 mg/dL   Nitrite, UA Negative Negative      Assessment & Plan:   Problem List Items Addressed This Visit      Musculoskeletal and Integument   Acute idiopathic gout involving toe of left foot - Primary    Under good control on current regimen. Continue current regimen. Continue to monitor. Call with any concerns. Refills up to date. Labs drawn today.       Relevant Orders   Comprehensive metabolic panel   Uric acid     Other   Hyperlipidemia    Under good control on current regimen. Continue current regimen. Continue to monitor. Call with any concerns. Refills up to date. Labs drawn today.      Relevant Orders   Comprehensive metabolic panel   Lipid Panel w/o Chol/HDL Ratio    Other Visit Diagnoses    Need for hepatitis C screening test       Labs drawn today. Await today.    Relevant Orders   Hepatitis C antibody       Follow up plan: Return in about 3 months (around 09/30/2020).

## 2020-07-01 ENCOUNTER — Encounter: Payer: Self-pay | Admitting: Family Medicine

## 2020-07-01 LAB — COMPREHENSIVE METABOLIC PANEL
ALT: 19 IU/L (ref 0–44)
AST: 20 IU/L (ref 0–40)
Albumin/Globulin Ratio: 1.4 (ref 1.2–2.2)
Albumin: 4.2 g/dL (ref 4.0–5.0)
Alkaline Phosphatase: 97 IU/L (ref 48–121)
BUN/Creatinine Ratio: 22 — ABNORMAL HIGH (ref 9–20)
BUN: 22 mg/dL (ref 6–24)
Bilirubin Total: 0.6 mg/dL (ref 0.0–1.2)
CO2: 19 mmol/L — ABNORMAL LOW (ref 20–29)
Calcium: 9.3 mg/dL (ref 8.7–10.2)
Chloride: 109 mmol/L — ABNORMAL HIGH (ref 96–106)
Creatinine, Ser: 1 mg/dL (ref 0.76–1.27)
GFR calc Af Amer: 101 mL/min/{1.73_m2} (ref >59)
GFR calc non Af Amer: 87 mL/min/{1.73_m2} (ref >59)
Globulin, Total: 3.1 g/dL (ref 1.5–4.5)
Glucose: 105 mg/dL — ABNORMAL HIGH (ref 65–99)
Potassium: 4.6 mmol/L (ref 3.5–5.2)
Sodium: 148 mmol/L — ABNORMAL HIGH (ref 134–144)
Total Protein: 7.3 g/dL (ref 6.0–8.5)

## 2020-07-01 LAB — LIPID PANEL W/O CHOL/HDL RATIO
Cholesterol, Total: 156 mg/dL (ref 100–199)
HDL: 42 mg/dL (ref >39)
LDL Chol Calc (NIH): 83 mg/dL (ref 0–99)
Triglycerides: 185 mg/dL — ABNORMAL HIGH (ref 0–149)
VLDL Cholesterol Cal: 31 mg/dL (ref 5–40)

## 2020-07-01 LAB — URIC ACID: Uric Acid: 7.8 mg/dL (ref 3.8–8.4)

## 2020-07-01 LAB — HEPATITIS C ANTIBODY: Hep C Virus Ab: <0.1 s/co ratio (ref 0.0–0.9)

## 2020-07-03 ENCOUNTER — Encounter: Payer: Self-pay | Admitting: Dermatology

## 2020-07-04 ENCOUNTER — Other Ambulatory Visit: Payer: Self-pay

## 2020-07-04 MED ORDER — METROGEL 1 % EX GEL: Freq: Two times a day (BID) | CUTANEOUS | 0 refills | Status: DC

## 2020-07-04 NOTE — Progress Notes (Signed)
Metronidazole changed to MetroGel for Medicaid to cover prescription.

## 2020-07-13 ENCOUNTER — Ambulatory Visit (INDEPENDENT_AMBULATORY_CARE_PROVIDER_SITE_OTHER): Payer: Medicaid Other

## 2020-07-13 ENCOUNTER — Other Ambulatory Visit: Payer: Self-pay

## 2020-07-13 DIAGNOSIS — L209 Atopic dermatitis, unspecified: Secondary | ICD-10-CM

## 2020-07-13 NOTE — Progress Notes (Signed)
Patient here for 2 week Dupixent injection.   Dupixent 300mg /71mL injected into patients right upper arm. Patient tolerated well.  LOT: [id number] EXP: 12/30/21

## 2020-07-15 ENCOUNTER — Other Ambulatory Visit: Payer: Self-pay | Admitting: Dermatology

## 2020-07-15 DIAGNOSIS — L280 Lichen simplex chronicus: Secondary | ICD-10-CM

## 2020-07-20 ENCOUNTER — Other Ambulatory Visit: Payer: Self-pay | Admitting: Dermatology

## 2020-07-20 DIAGNOSIS — L280 Lichen simplex chronicus: Secondary | ICD-10-CM

## 2020-07-22 ENCOUNTER — Encounter: Payer: Self-pay | Admitting: Family Medicine

## 2020-07-26 ENCOUNTER — Encounter: Payer: Self-pay | Admitting: Dermatology

## 2020-07-27 ENCOUNTER — Other Ambulatory Visit: Payer: Self-pay

## 2020-07-27 ENCOUNTER — Ambulatory Visit: Payer: Medicaid Other

## 2020-07-27 DIAGNOSIS — L209 Atopic dermatitis, unspecified: Secondary | ICD-10-CM | POA: Diagnosis not present

## 2020-07-27 NOTE — Progress Notes (Signed)
Patient here for two week Dupixent injection.  Dupixent 300mg /50mL injected into patients left upper arm. Patient tolerated well.  LOT: [id number] EXP:10/30/21

## 2020-07-30 ENCOUNTER — Encounter: Payer: Self-pay | Admitting: Dermatology

## 2020-08-01 ENCOUNTER — Encounter: Payer: Self-pay | Admitting: Family Medicine

## 2020-08-09 ENCOUNTER — Ambulatory Visit: Payer: Medicaid Other | Admitting: Dermatology

## 2020-08-09 ENCOUNTER — Encounter: Payer: Self-pay | Admitting: Dermatology

## 2020-08-09 ENCOUNTER — Encounter: Payer: Self-pay | Admitting: Family Medicine

## 2020-08-09 ENCOUNTER — Other Ambulatory Visit: Payer: Self-pay

## 2020-08-09 DIAGNOSIS — D485 Neoplasm of uncertain behavior of skin: Secondary | ICD-10-CM

## 2020-08-09 DIAGNOSIS — S91105A Unspecified open wound of left lesser toe(s) without damage to nail, initial encounter: Secondary | ICD-10-CM

## 2020-08-09 DIAGNOSIS — D492 Neoplasm of unspecified behavior of bone, soft tissue, and skin: Secondary | ICD-10-CM

## 2020-08-09 DIAGNOSIS — T148XXA Other injury of unspecified body region, initial encounter: Secondary | ICD-10-CM

## 2020-08-09 DIAGNOSIS — S01301A Unspecified open wound of right ear, initial encounter: Secondary | ICD-10-CM

## 2020-08-09 DIAGNOSIS — L659 Nonscarring hair loss, unspecified: Secondary | ICD-10-CM

## 2020-08-09 DIAGNOSIS — L308 Other specified dermatitis: Secondary | ICD-10-CM | POA: Diagnosis not present

## 2020-08-09 DIAGNOSIS — L609 Nail disorder, unspecified: Secondary | ICD-10-CM

## 2020-08-09 MED ORDER — GENTAMICIN SULFATE 0.1 % EX OINT: TOPICAL_OINTMENT | CUTANEOUS | 1 refills | Status: DC

## 2020-08-09 MED ORDER — MUPIROCIN 2 % EX OINT: TOPICAL_OINTMENT | CUTANEOUS | 1 refills | Status: DC

## 2020-08-09 MED ORDER — DUPILUMAB 300 MG/2ML ~~LOC~~ SOAJ
300.0000 mg | Freq: Once | SUBCUTANEOUS | Status: AC
  Administered 2020-08-09: 300 mg via SUBCUTANEOUS

## 2020-08-09 NOTE — Patient Instructions (Signed)

## 2020-08-09 NOTE — Progress Notes (Addendum)
Follow-Up Visit   Subjective  Jesus Guerrero is a 49 y.o. male who presents for the following: Dermatitis (Pt c/o itchy scalp, treating with Clobetasol sol bid, Ketoconazole shampoo once a day with a poor response ) and Eczema (2 weeks f/u on Eczema, treating with Dupixent injections every 2 weeks with a good response ). He also complains of a nail problem at his left 5th toe and left great toe. He also notes a growing spot on his left ear and a wound at his right ear.  The following portions of the chart were reviewed this encounter and updated as appropriate:  Tobacco  Allergies  Meds  Problems  Med Hx  Surg Hx  Fam Hx      Review of Systems:  No other skin or systemic complaints except as noted in HPI or Assessment and Plan.  Objective  Well appearing patient in no apparent distress; mood and affect are within normal limits.  A focused examination was performed including face, ears, scalp, arms, hands, nails, feet. Relevant physical exam findings are noted in the Assessment and Plan.  Objective  Left Upper Arm - Anterior: Scaly erythematous papules and patches +/- dyspigmentation, lichenification, excoriations.   Objective  Left great toenail: Left great toenail thickened and curved  Objective  Left 5th Proximal Nail fold of Toe, Right post earlobe: Open wound right earlobe Loss of nail, crust, open wound left 5th toenail  Objective  Left helix: 0.2 cm white papule   Objective  vertex scalp: Slightly erythematous alopecic patch   Assessment & Plan  Other eczema Left Upper Arm - Anterior  Chronic, currently well-controlled on Dupixent injection, eucrisa  No pain at injection site from Dupixent injection No side effects   Cont Dupixent injection 300 mg/2 ml  injected at R upper arm  Lot #[id number] Exp: 10-30-2021  Cont Eucrisa twice a day as needed   Nail problem Left great toenail  Consider clipping at follow-up visit  Open wound (2) Right post  earlobe; Left 5th Proximal Nail fold of Toe  Start Mupirocin ointment apply to skin twice a day as needed to open wound   Neoplasm of skin Left helix  Skin / nail biopsy Type of biopsy: tangential   Informed consent: discussed and consent obtained   Patient was prepped and draped in usual sterile fashion: area prepped with alochol. Anesthesia: the lesion was anesthetized in a standard fashion   Anesthetic:  1% lidocaine w/ epinephrine 1-100,000 buffered w/ 8.4% NaHCO3 Instrument used: flexible razor blade   Hemostasis achieved with: pressure, aluminum chloride and electrodesiccation   Outcome: patient tolerated procedure well   Post-procedure details: wound care instructions given   Post-procedure details comment:  Ointment and small bandage  Ordered Medications: gentamicin ointment (GARAMYCIN) 0.1 %  Alopecia vertex scalp  Punch biopsy R/O Tinea capitis vs Seborrheic dermatitis vs LSC Apply mupirocin ointment daily while healing  Skin / nail biopsy - vertex scalp Type of biopsy: punch   Informed consent: discussed and consent obtained   Patient was prepped and draped in usual sterile fashion: area prepped with alcohol. Anesthesia: the lesion was anesthetized in a standard fashion   Anesthetic:  1% lidocaine w/ epinephrine 1-100,000 buffered w/ 8.4% NaHCO3 Punch size:  4 mm Suture size:  4-0 Suture type: nylon   Suture removal (days):  7 Hemostasis achieved with: suture and pressure   Outcome: patient tolerated procedure well   Post-procedure details: wound care instructions given   Post-procedure details comment:  Ointment and small bandage  Specimen 2 - Surgical pathology Differential Diagnosis: R/O Tinea Capitis vs Seborrheic dermatitis vs LSC Check Margins: No  Other Related Procedures Culture, Fungus with Smear  Ordered Medications: gentamicin ointment (GARAMYCIN) 0.1 %  Return in about 1 week (around 08/16/2020) for f/u and suture removal, 2 week Dupixent  injection with a nurse .  I, Marye Round, CMA, am acting as scribe for [name], MD .  Documentation: I have reviewed the above documentation for accuracy and completeness, and I agree with the above.  [name], MD

## 2020-08-10 ENCOUNTER — Ambulatory Visit: Payer: Medicaid Other | Admitting: Family Medicine

## 2020-08-15 ENCOUNTER — Encounter: Payer: Self-pay | Admitting: Dermatology

## 2020-08-16 ENCOUNTER — Encounter: Payer: Self-pay | Admitting: Family Medicine

## 2020-08-16 ENCOUNTER — Other Ambulatory Visit: Payer: Self-pay

## 2020-08-16 ENCOUNTER — Other Ambulatory Visit: Payer: Self-pay | Admitting: Dermatology

## 2020-08-16 ENCOUNTER — Telehealth: Payer: Self-pay

## 2020-08-16 ENCOUNTER — Ambulatory Visit: Payer: Medicaid Other | Admitting: Dermatology

## 2020-08-16 ENCOUNTER — Encounter: Payer: Self-pay | Admitting: Dermatology

## 2020-08-16 DIAGNOSIS — M1A9XX1 Chronic gout, unspecified, with tophus (tophi): Secondary | ICD-10-CM | POA: Diagnosis not present

## 2020-08-16 DIAGNOSIS — T148XXA Other injury of unspecified body region, initial encounter: Secondary | ICD-10-CM

## 2020-08-16 DIAGNOSIS — L905 Scar conditions and fibrosis of skin: Secondary | ICD-10-CM

## 2020-08-16 DIAGNOSIS — S91105A Unspecified open wound of left lesser toe(s) without damage to nail, initial encounter: Secondary | ICD-10-CM | POA: Diagnosis not present

## 2020-08-16 DIAGNOSIS — L700 Acne vulgaris: Secondary | ICD-10-CM

## 2020-08-16 DIAGNOSIS — L609 Nail disorder, unspecified: Secondary | ICD-10-CM | POA: Diagnosis not present

## 2020-08-16 MED ORDER — CLINDAMYCIN PHOS-BENZOYL PEROX 1.2-5 % EX GEL
CUTANEOUS | 2 refills | Status: DC
Start: 2020-08-16 — End: 2020-10-19

## 2020-08-16 NOTE — Telephone Encounter (Signed)
Called [facility] spoke with Rosann Auerbach, please correct biopsy location from R helix 08-09-20, correct location is L helix, corrected biopsy report will be sent here

## 2020-08-16 NOTE — Telephone Encounter (Signed)
Thank you [place].

## 2020-08-16 NOTE — Progress Notes (Signed)
Follow-Up Visit   Subjective  Jesus Guerrero is a 49 y.o. 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   The following portions of the chart were reviewed this encounter and updated as appropriate:  Tobacco  Allergies  Meds  Problems  Med Hx  Surg Hx  Fam Hx      Review of Systems:  No other skin or systemic complaints except as noted in HPI or Assessment and Plan.  Objective  Well appearing patient in no apparent distress; mood and affect are within normal limits.  A focused examination was performed including face,ears,scalp, toenails. Relevant physical exam findings are noted in the Assessment and Plan.  Objective  vertex scalp: Dyspigmented smooth macule or patch.   Objective  Left helix: Biopsy proven gout with tophus  Objective  Head - Anterior (Face): Few small excoriated erythematous papules on the forehead and cheeks  Objective  Left great toenail: Nail thickened with subungual debris and lateral curvature  Objective  Left 5th toenail: Crusting of nailbed with missing nail   Assessment & Plan  Scar vertex scalp  Biopsy proven fibrosis and epidermal hyperplasia without evidence of fungus on PAS  Encounter for Removal of Sutures - Incision site at the vertex scalp  is clean, dry and intact - Wound cleansed, sutures removed, wound cleansed   - Discussed pathology results showing  EPIDERMAL HYPERPLASIA WITH FIBROSIS - Patient advised to keep steri-strips dry until they fall off. - Scars remodel for a full year. - Once steri-strips fall off, patient can apply over-the-counter silicone scar cream each night to help with scar remodeling if desired. - Patient advised to call  with any concerns or if they notice any new or changing lesions.   6 injections sites with 2 mg/ml ILK 0.3 ml total  Intralesional injection - vertex scalp Location: scalp   Informed Consent: Discussed risks (infection, pain, bleeding, bruising, thinning of the skin, loss of skin pigment, lack of resolution, and recurrence of lesion) and benefits of the procedure, as well as the alternatives. Informed consent was obtained. Preparation: The area was prepared a standard fashion.  Anesthesia:none  Procedure Details: An intralesional injection was performed with Kenalog 2 mg/cc. 0.3 cc in total were injected.  Total number of injections: 6  Plan: The patient was instructed on post-op care. Recommend OTC analgesia as needed for pain.   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  Patient reports mild tenderness at biopsy site.  No evidence of infection today.  Continue gentamicin ointment 3 times daily and start mupirocin ointment 3 times daily.   Follow-up with PCP regarding gout  Acne vulgaris Head - Anterior (Face)  Start BPO/Clindamycin gel apply to face qd   Benzoyl peroxide can cause dryness and irritation of the skin. It can also bleach fabric. When used together with Aczone (dapsone) cream, it can stain the skin orange.   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  I, Marye Round, CMA, am acting as  scribe for [name], MD .  Documentation: I have reviewed the above documentation for accuracy and completeness, and I agree with the above.  [name], MD

## 2020-08-17 ENCOUNTER — Other Ambulatory Visit: Payer: Self-pay

## 2020-08-17 ENCOUNTER — Other Ambulatory Visit: Payer: Self-pay | Admitting: Dermatology

## 2020-08-17 MED ORDER — DUPIXENT 300 MG/2ML ~~LOC~~ SOAJ
300.0000 mg | SUBCUTANEOUS | 5 refills | Status: DC
Start: 2020-08-17 — End: 2021-06-27

## 2020-08-17 NOTE — Progress Notes (Signed)
Dupixent RF to [employer]. aw

## 2020-08-21 ENCOUNTER — Encounter: Payer: Self-pay | Admitting: Dermatology

## 2020-08-21 ENCOUNTER — Encounter: Payer: Self-pay | Admitting: Family Medicine

## 2020-08-23 ENCOUNTER — Ambulatory Visit: Payer: Medicaid Other

## 2020-08-24 ENCOUNTER — Other Ambulatory Visit: Payer: Self-pay

## 2020-08-24 ENCOUNTER — Encounter: Payer: Self-pay | Admitting: Family Medicine

## 2020-08-24 ENCOUNTER — Ambulatory Visit: Payer: Medicaid Other

## 2020-08-24 DIAGNOSIS — L209 Atopic dermatitis, unspecified: Secondary | ICD-10-CM | POA: Diagnosis not present

## 2020-08-24 NOTE — Progress Notes (Signed)
Patient here for two week Dupixent injection.  Dupixent 300mg /57mL injected SQ into right arm. Patient tolerated well.  LOT: [id number] EXP: 06/29/2022

## 2020-08-26 ENCOUNTER — Encounter: Payer: Self-pay | Admitting: Family Medicine

## 2020-08-26 ENCOUNTER — Ambulatory Visit (INDEPENDENT_AMBULATORY_CARE_PROVIDER_SITE_OTHER): Payer: Medicaid Other | Admitting: Family Medicine

## 2020-08-26 ENCOUNTER — Other Ambulatory Visit: Payer: Self-pay

## 2020-08-26 VITALS — BP 130/74 | HR 81 | Temp 99.4°F | Wt 186.2 lb

## 2020-08-26 DIAGNOSIS — Z23 Encounter for immunization: Secondary | ICD-10-CM | POA: Diagnosis not present

## 2020-08-26 DIAGNOSIS — M1A09X0 Idiopathic chronic gout, multiple sites, without tophus (tophi): Secondary | ICD-10-CM

## 2020-08-26 MED ORDER — ALLOPURINOL 300 MG PO TABS: 300.0000 mg | ORAL_TABLET | Freq: Every day | ORAL | 1 refills | Status: DC

## 2020-08-26 NOTE — Progress Notes (Signed)
BP 130/74 (BP Location: Left Arm, Patient Position: Sitting, Cuff Size: Normal)   Pulse 81   Temp 99.4 F (37.4 C) (Oral)   Wt 186 lb 3.2 oz (84.5 kg)   SpO2 98%   BMI 31.02 kg/m    Subjective:    Patient ID: Jesus Guerrero, male    DOB: 24-Apr-1971, 49 y.o.   MRN: [id number]  HPI: Jesus Guerrero is a 49 y.o. male  Chief Complaint  Patient presents with  . Gout   Had a biopsy of his L ear done at dermatology and they found a gouty tophus. He has had no flares, he has been feeling well. Taking his allopurinol as prescribed. He was advised to come to get his medications adjusted. No other concerns or complaints at this time.   Relevant past medical, surgical, family and social history reviewed and updated as indicated. Interim medical history since our last visit reviewed. Allergies and medications reviewed and updated.  Review of Systems  Constitutional: Negative.   Respiratory: Negative.   Cardiovascular: Negative.   Gastrointestinal: Negative.   Musculoskeletal: Negative.   Neurological: Negative.   Psychiatric/Behavioral: Negative.     Per HPI unless specifically indicated above     Objective:    BP 130/74 (BP Location: Left Arm, Patient Position: Sitting, Cuff Size: Normal)   Pulse 81   Temp 99.4 F (37.4 C) (Oral)   Wt 186 lb 3.2 oz (84.5 kg)   SpO2 98%   BMI 31.02 kg/m   Wt Readings from Last 3 Encounters:  08/26/20 186 lb 3.2 oz (84.5 kg)  06/30/20 190 lb (86.2 kg)  03/29/20 193 lb (87.5 kg)    Physical Exam Vitals and nursing note reviewed.  Constitutional:      General: He is not in acute distress.    Appearance: Normal appearance. He is not ill-appearing, toxic-appearing or diaphoretic.  HENT:     Head: Normocephalic and atraumatic.     Right Ear: External ear normal.     Left Ear: External ear normal.     Nose: Nose normal.     Mouth/Throat:     Mouth: Mucous membranes are moist.     Pharynx: Oropharynx is clear.  Eyes:     General:  No scleral icterus.       Right eye: No discharge.        Left eye: No discharge.     Extraocular Movements: Extraocular movements intact.     Conjunctiva/sclera: Conjunctivae normal.     Pupils: Pupils are equal, round, and reactive to light.  Cardiovascular:     Rate and Rhythm: Normal rate and regular rhythm.     Pulses: Normal pulses.     Heart sounds: Normal heart sounds. No murmur heard.  No friction rub. No gallop.   Pulmonary:     Effort: Pulmonary effort is normal. No respiratory distress.     Breath sounds: Normal breath sounds. No stridor. No wheezing, rhonchi or rales.  Chest:     Chest wall: No tenderness.  Musculoskeletal:        General: Normal range of motion.     Cervical back: Normal range of motion and neck supple.  Skin:    General: Skin is warm and dry.     Capillary Refill: Capillary refill takes less than 2 seconds.     Coloration: Skin is not jaundiced or pale.     Findings: No bruising, erythema, lesion or rash.  Neurological:  General: No focal deficit present.     Mental Status: He is alert and oriented to person, place, and time. Mental status is at baseline.  Psychiatric:        Mood and Affect: Mood normal.        Behavior: Behavior normal.        Thought Content: Thought content normal.        Judgment: Judgment normal.     Results for orders placed or performed in visit on 06/30/20  Hepatitis C antibody  Result Value Ref Range   Hep C Virus Ab <0.1 0.0 - 0.9 s/co ratio  Comprehensive metabolic panel  Result Value Ref Range   Glucose 105 (H) 65 - 99 mg/dL   BUN 22 6 - 24 mg/dL   Creatinine, Ser 1.00 0.76 - 1.27 mg/dL   GFR calc non Af Amer 87 >59 mL/min/1.73   GFR calc Af Amer 101 >59 mL/min/1.73   BUN/Creatinine Ratio 22 (H) 9 - 20   Sodium 148 (H) 134 - 144 mmol/L   Potassium 4.6 3.5 - 5.2 mmol/L   Chloride 109 (H) 96 - 106 mmol/L   CO2 19 (L) 20 - 29 mmol/L   Calcium 9.3 8.7 - 10.2 mg/dL   Total Protein 7.3 6.0 - 8.5 g/dL    Albumin 4.2 4.0 - 5.0 g/dL   Globulin, Total 3.1 1.5 - 4.5 g/dL   Albumin/Globulin Ratio 1.4 1.2 - 2.2   Bilirubin Total 0.6 0.0 - 1.2 mg/dL   Alkaline Phosphatase 97 48 - 121 IU/L   AST 20 0 - 40 IU/L   ALT 19 0 - 44 IU/L  Lipid Panel w/o Chol/HDL Ratio  Result Value Ref Range   Cholesterol, Total 156 100 - 199 mg/dL   Triglycerides 185 (H) 0 - 149 mg/dL   HDL 42 >39 mg/dL   VLDL Cholesterol Cal 31 5 - 40 mg/dL   LDL Chol Calc (NIH) 83 0 - 99 mg/dL  Uric acid  Result Value Ref Range   Uric Acid 7.8 3.8 - 8.4 mg/dL      Assessment & Plan:   Problem List Items Addressed This Visit      Other   Chronic gout - Primary    Will increase his allpurinol to 300mg  and recheck 1 month. Call with any concerns. Continue to monitor.           Follow up plan: Return in about 4 weeks (around 09/23/2020).

## 2020-08-26 NOTE — Assessment & Plan Note (Signed)
Will increase his allpurinol to 300mg  and recheck 1 month. Call with any concerns. Continue to monitor.

## 2020-08-27 ENCOUNTER — Encounter: Payer: Self-pay | Admitting: Dermatology

## 2020-09-01 ENCOUNTER — Encounter: Payer: Self-pay | Admitting: Family Medicine

## 2020-09-07 ENCOUNTER — Ambulatory Visit: Payer: Medicaid Other | Admitting: Dermatology

## 2020-09-07 ENCOUNTER — Encounter: Payer: Self-pay | Admitting: Family Medicine

## 2020-09-07 ENCOUNTER — Other Ambulatory Visit: Payer: Self-pay

## 2020-09-07 DIAGNOSIS — L905 Scar conditions and fibrosis of skin: Secondary | ICD-10-CM

## 2020-09-07 DIAGNOSIS — D1800 Hemangioma unspecified site: Secondary | ICD-10-CM

## 2020-09-07 DIAGNOSIS — L209 Atopic dermatitis, unspecified: Secondary | ICD-10-CM | POA: Diagnosis not present

## 2020-09-07 DIAGNOSIS — L578 Other skin changes due to chronic exposure to nonionizing radiation: Secondary | ICD-10-CM

## 2020-09-07 DIAGNOSIS — Z1283 Encounter for screening for malignant neoplasm of skin: Secondary | ICD-10-CM | POA: Diagnosis not present

## 2020-09-07 DIAGNOSIS — L821 Other seborrheic keratosis: Secondary | ICD-10-CM

## 2020-09-07 DIAGNOSIS — D229 Melanocytic nevi, unspecified: Secondary | ICD-10-CM

## 2020-09-07 DIAGNOSIS — Z86018 Personal history of other benign neoplasm: Secondary | ICD-10-CM

## 2020-09-07 DIAGNOSIS — L918 Other hypertrophic disorders of the skin: Secondary | ICD-10-CM

## 2020-09-07 DIAGNOSIS — H109 Unspecified conjunctivitis: Secondary | ICD-10-CM

## 2020-09-07 MED ORDER — DUPILUMAB 300 MG/2ML ~~LOC~~ SOAJ: 300.0000 mg | Freq: Once | SUBCUTANEOUS | Status: DC

## 2020-09-07 NOTE — Progress Notes (Signed)
   Follow-Up Visit   Subjective  Jesus Guerrero is a 49 y.o. male who presents for the following: Follow-up (Atopic Derm and Dupixent injection) and TBSE.  Patient presents today for follow up on Atopic Derm and Dupixent injection. Patient also here for full body skin exam and skin cancer screening. Patient has no areas of concern at this time. Patient has h/o of atypical nevi Left calf 08/13/19 and Left helix 07/16/18.   The following portions of the chart were reviewed this encounter and updated as appropriate:  Tobacco  Allergies  Meds  Problems  Med Hx  Surg Hx  Fam Hx      Review of Systems:  No other skin or systemic complaints except as noted in HPI or Assessment and Plan.  Objective  Well appearing patient in no apparent distress; mood and affect are within normal limits.  A full examination was performed including scalp, head, eyes, ears, nose, lips, neck, chest, axillae, abdomen, back, buttocks, bilateral upper extremities, bilateral lower extremities, hands, feet, fingers, toes, fingernails, and toenails. All findings within normal limits unless otherwise noted below.  Objective  Scalp: Clear today  Objective  Scalp: Dyspigmented smooth macule or patch.   Objective  B/L eye: Very mild conjunctival injection   Assessment & Plan  Atopic dermatitis, unspecified type Scalp  Chronic, well-controlled  Continue dupixent.  Patient denies injection site reaction, herpes viral infections.  He has slight itching of the eyes.  Dupixent injection given today in Left deltoid, patient tolerated well.  Other Related Medications dupilumab (DUPIXENT) prefilled syringe 300 mg  Scar Scalp  Biopsy Proven scar s/p ILK With occasional itch, improved Continue Clobetasol ointment twice a day as needed for itch   Conjunctivitis of both eyes, unspecified conjunctivitis type B/L eye  Possible allergic conjunctivitis 2nd to seasonal allergy (Favor) over  Dupixent Recommend Ketotifen eye drops OTC Recommend follow up with Ophthalmologist and call us if persistent   Lentigines - Scattered tan macules - Discussed due to sun exposure - Benign, observe - Call for any changes  Seborrheic Keratoses - Stuck-on, waxy, tan-brown papules and plaques  - Discussed benign etiology and prognosis. - Observe - Call for any changes  Melanocytic Nevi - Tan-brown and/or pink-flesh-colored symmetric macules and papules - Benign appearing on exam today - Observation - Call clinic for new or changing moles - Recommend daily use of broad spectrum spf 30+ sunscreen to sun-exposed areas.   Hemangiomas - Red papules - Discussed benign nature - Observe - Call for any changes  Actinic Damage - diffuse scaly erythematous macules with underlying dyspigmentation - Recommend daily broad spectrum sunscreen SPF 30+ to sun-exposed areas, reapply every 2 hours as needed.  - Call for new or changing lesions.  Skin cancer screening performed today.  History of Atypical Nevi  Left calf 08/13/19 and Left helix 07/16/18 - No evidence of recurrence today - Recommend regular full body skin exams - Recommend daily broad spectrum sunscreen SPF 30+ to sun-exposed areas, reapply every 2 hours as needed.  - Call if any new or changing lesions are noted between office visits    Return in about 2 months (around 11/07/2020) for Dr. Laurence Ferrari,  2 weeks [place], 1 year TBSE.  I, Donzetta Kohut, CMA, am acting as scribe for [name], MD .  Documentation: I have reviewed the above documentation for accuracy and completeness, and I agree with the above.  [name], MD

## 2020-09-07 NOTE — Patient Instructions (Addendum)
Recommend daily broad spectrum sunscreen SPF 30+ to sun-exposed areas, reapply every 2 hours as needed. Call for new or changing lesions.  Recommend Ketotifen eye drops for eyes

## 2020-09-09 ENCOUNTER — Encounter: Payer: Self-pay | Admitting: Family Medicine

## 2020-09-09 ENCOUNTER — Other Ambulatory Visit: Payer: Self-pay | Admitting: Family Medicine

## 2020-09-09 LAB — FUNGUS CULTURE W SMEAR

## 2020-09-13 ENCOUNTER — Encounter: Payer: Self-pay | Admitting: Dermatology

## 2020-09-14 ENCOUNTER — Other Ambulatory Visit: Payer: Self-pay | Admitting: Dermatology

## 2020-09-15 ENCOUNTER — Encounter: Payer: Self-pay | Admitting: Family Medicine

## 2020-09-15 NOTE — Progress Notes (Signed)
No fungus observed. Nail changes likely due to trauma. No treatment needed.

## 2020-09-21 ENCOUNTER — Other Ambulatory Visit: Payer: Self-pay

## 2020-09-21 ENCOUNTER — Ambulatory Visit: Payer: Medicaid Other

## 2020-09-21 DIAGNOSIS — L209 Atopic dermatitis, unspecified: Secondary | ICD-10-CM

## 2020-09-21 MED ORDER — DUPILUMAB 300 MG/2ML ~~LOC~~ SOAJ
300.0000 mg | Freq: Once | SUBCUTANEOUS | Status: AC
  Administered 2020-09-21: 300 mg via SUBCUTANEOUS

## 2020-09-21 NOTE — Progress Notes (Signed)
Patient here for two week Dupixent injection.  Dupixent 300mg /11mL injected SQ into left arm. Patient tolerated well.  LOT: [id number]  EXP: 06/29/2022

## 2020-09-26 ENCOUNTER — Encounter: Payer: Self-pay | Admitting: Dermatology

## 2020-09-28 ENCOUNTER — Ambulatory Visit
Admission: RE | Admit: 2020-09-28 | Discharge: 2020-09-28 | Disposition: A | Discharge status: Home / Self Care | Payer: Medicaid Other | Source: Ambulatory Visit | Attending: Family Medicine | Admitting: Family Medicine

## 2020-09-28 ENCOUNTER — Other Ambulatory Visit: Payer: Self-pay

## 2020-09-28 ENCOUNTER — Ambulatory Visit (INDEPENDENT_AMBULATORY_CARE_PROVIDER_SITE_OTHER): Payer: Medicaid Other | Admitting: Family Medicine

## 2020-09-28 ENCOUNTER — Ambulatory Visit
Admission: RE | Admit: 2020-09-28 | Discharge: 2020-09-28 | Disposition: A | Discharge status: Home / Self Care | Payer: Medicaid Other | Attending: Family Medicine | Admitting: Family Medicine

## 2020-09-28 ENCOUNTER — Encounter: Payer: Self-pay | Admitting: Family Medicine

## 2020-09-28 VITALS — BP 146/70 | HR 86 | Temp 97.9°F | Ht 64.0 in | Wt 191.4 lb

## 2020-09-28 DIAGNOSIS — M545 Low back pain, unspecified: Secondary | ICD-10-CM

## 2020-09-28 DIAGNOSIS — R319 Hematuria, unspecified: Secondary | ICD-10-CM

## 2020-09-28 DIAGNOSIS — M1A09X0 Idiopathic chronic gout, multiple sites, without tophus (tophi): Secondary | ICD-10-CM | POA: Diagnosis not present

## 2020-09-28 DIAGNOSIS — N50812 Left testicular pain: Secondary | ICD-10-CM | POA: Insufficient documentation

## 2020-09-28 LAB — MICROSCOPIC EXAMINATION
Bacteria, UA: NONE SEEN
WBC, UA: NONE SEEN /hpf (ref 0–5)

## 2020-09-28 LAB — UA/M W/RFLX CULTURE, ROUTINE
Bilirubin, UA: NEGATIVE
Glucose, UA: NEGATIVE
Leukocytes,UA: NEGATIVE
Nitrite, UA: NEGATIVE
Protein,UA: NEGATIVE
Specific Gravity, UA: 1.02 (ref 1.005–1.030)
Urobilinogen, Ur: 0.2 mg/dL (ref 0.2–1.0)
pH, UA: 5.5 (ref 5.0–7.5)

## 2020-09-28 MED ORDER — KETOROLAC TROMETHAMINE 60 MG/2ML IM SOLN: 60.0000 mg | Freq: Once | INTRAMUSCULAR | Status: DC

## 2020-09-28 MED ORDER — CYCLOBENZAPRINE HCL 10 MG PO TABS: 10.0000 mg | ORAL_TABLET | Freq: Every day | ORAL | 0 refills | Status: DC

## 2020-09-28 MED ORDER — KETOROLAC TROMETHAMINE 60 MG/2ML IM SOLN
60.0000 mg | Freq: Once | INTRAMUSCULAR | Status: AC
  Administered 2020-09-28: 60 mg via INTRAMUSCULAR

## 2020-09-28 MED ORDER — NAPROXEN 500 MG PO TABS: 500.0000 mg | ORAL_TABLET | Freq: Two times a day (BID) | ORAL | 0 refills | Status: DC

## 2020-09-28 NOTE — Progress Notes (Signed)
BP (!) 146/70 (BP Location: Left Arm, Patient Position: Sitting, Cuff Size: Large)   Pulse 86   Temp 97.9 F (36.6 C) (Oral)   Ht 5\' 4"  (1.626 m)   Wt 191 lb 6.4 oz (86.8 kg)   SpO2 97%   BMI 32.85 kg/m    Subjective:    Patient ID: Jesus Guerrero, male    DOB: [birth year], 49 y.o.   MRN: [id number]  HPI: Jesus Guerrero is a 49 y.o. male  Chief Complaint  Patient presents with  . Follow-up    gout  . Back Pain   Tolerating his higher dose of allopurinol well. No flares. No concerns with the medicine.   BACK PAIN- has been having pain in the left testicle Duration: 3 weeks Mechanism of injury: no trauma Location: Left and low back Onset: sudden Severity: 8/10 Quality: sharp and aching Frequency: constant, worse at night Radiation: none Aggravating factors: none Alleviating factors: APAP Status: worse Treatments attempted:tylenol   Relief with NSAIDs?: No NSAIDs Taken Nighttime pain:  yes Paresthesias / decreased sensation:  yes Bowel / bladder incontinence:  no Fevers:  no Dysuria / urinary frequency:  no   Relevant past medical, surgical, family and social history reviewed and updated as indicated. Interim medical history since our last visit reviewed. Allergies and medications reviewed and updated.  Review of Systems  Constitutional: Negative.   Cardiovascular: Negative.   Gastrointestinal: Negative.   Genitourinary: Positive for testicular pain. Negative for decreased urine volume, difficulty urinating, discharge, dysuria, enuresis, flank pain, frequency, genital sores, hematuria, penile pain, penile swelling, scrotal swelling and urgency.  Musculoskeletal: Positive for back pain and myalgias. Negative for arthralgias, gait problem, joint swelling, neck pain and neck stiffness.  Skin: Negative.   Psychiatric/Behavioral: Negative.     Per HPI unless specifically indicated above     Objective:    BP (!) 146/70 (BP Location: Left Arm, Patient  Position: Sitting, Cuff Size: Large)   Pulse 86   Temp 97.9 F (36.6 C) (Oral)   Ht 5\' 4"  (1.626 m)   Wt 191 lb 6.4 oz (86.8 kg)   SpO2 97%   BMI 32.85 kg/m   Wt Readings from Last 3 Encounters:  09/28/20 191 lb 6.4 oz (86.8 kg)  08/26/20 186 lb 3.2 oz (84.5 kg)  06/30/20 190 lb (86.2 kg)    Physical Exam Vitals and nursing note reviewed. Exam conducted with a chaperone present.  Constitutional:      General: He is not in acute distress.    Appearance: Normal appearance. He is not ill-appearing, toxic-appearing or diaphoretic.  HENT:     Head: Normocephalic and atraumatic.     Right Ear: External ear normal.     Left Ear: External ear normal.     Nose: Nose normal.     Mouth/Throat:     Mouth: Mucous membranes are moist.     Pharynx: Oropharynx is clear.  Eyes:     General: No scleral icterus.       Right eye: No discharge.        Left eye: No discharge.     Extraocular Movements: Extraocular movements intact.     Conjunctiva/sclera: Conjunctivae normal.     Pupils: Pupils are equal, round, and reactive to light.  Cardiovascular:     Rate and Rhythm: Normal rate and regular rhythm.     Pulses: Normal pulses.     Heart sounds: Normal heart sounds. No murmur heard.  No friction rub.  No gallop.   Pulmonary:     Effort: Pulmonary effort is normal. No respiratory distress.     Breath sounds: Normal breath sounds. No stridor. No wheezing, rhonchi or rales.  Chest:     Chest wall: No tenderness.  Abdominal:     Tenderness: There is no right CVA tenderness or left CVA tenderness.  Genitourinary:    Penis: Normal.      Testes: Normal.  Musculoskeletal:        General: Normal range of motion.     Cervical back: Normal range of motion and neck supple.  Skin:    General: Skin is warm and dry.     Capillary Refill: Capillary refill takes less than 2 seconds.     Coloration: Skin is not jaundiced or pale.     Findings: No bruising, erythema, lesion or rash.  Neurological:      General: No focal deficit present.     Mental Status: He is alert and oriented to person, place, and time. Mental status is at baseline.  Psychiatric:        Mood and Affect: Mood normal.        Behavior: Behavior normal.        Thought Content: Thought content normal.        Judgment: Judgment normal.     Results for orders placed or performed in visit on 09/28/20  Microscopic Examination   Urine  Result Value Ref Range   WBC, UA None seen 0 - 5 /hpf   RBC 3-10 (A) 0 - 2 /hpf   Epithelial Cells (non renal) 0-10 0 - 10 /hpf   Casts Present None seen /lpf   Cast Type Hyaline casts N/A   Mucus, UA Present Not Estab.   Bacteria, UA None seen None seen/Few  UA/M w/rflx Culture, Routine   Specimen: Urine   Urine  Result Value Ref Range   Specific Gravity, UA 1.020 1.005 - 1.030   pH, UA 5.5 5.0 - 7.5   Color, UA Yellow Yellow   Appearance Ur Clear Clear   Leukocytes,UA Negative Negative   Protein,UA Negative Negative/Trace   Glucose, UA Negative Negative   Ketones, UA Trace (A) Negative   RBC, UA 1+ (A) Negative   Bilirubin, UA Negative Negative   Urobilinogen, Ur 0.2 0.2 - 1.0 mg/dL   Nitrite, UA Negative Negative   Microscopic Examination See below:       Assessment & Plan:   Problem List Items Addressed This Visit      Other   Chronic gout - Primary    Tolerating his medicine well. Checking labs today. Await results. Treat as needed.       Relevant Orders   Comprehensive metabolic panel   Uric acid    Other Visit Diagnoses    Pain in left testicle       ? kidney stone. Will check x-ray and testicular US. Await results. Toradol shot today.    Relevant Orders   UA/M w/rflx Culture, Routine (Completed)   US SCROTUM DOPPLER   DG Abd 1 View   Acute left-sided low back pain without sciatica       Will check for kidney stone. Start PT, naproxen and flexeril. Await x-rays. Treat as needed.    Relevant Medications   naproxen (NAPROSYN) 500 MG tablet    ketorolac (TORADOL) injection 60 mg (Completed)   cyclobenzaprine (FLEXERIL) 10 MG tablet   Other Relevant Orders   Ambulatory referral to Physical Therapy  UA/M w/rflx Culture, Routine (Completed)   DG Abd 1 View   DG Lumbar Spine Complete   Hematuria, unspecified type       Checking for stone. Await results.    Relevant Orders   DG Abd 1 View       Follow up plan: Return in about 4 weeks (around 10/26/2020).

## 2020-09-28 NOTE — Assessment & Plan Note (Signed)
Tolerating his medicine well. Checking labs today. Await results. Treat as needed.

## 2020-09-28 NOTE — Progress Notes (Signed)
error 

## 2020-09-29 ENCOUNTER — Encounter: Payer: Self-pay | Admitting: Family Medicine

## 2020-09-29 LAB — COMPREHENSIVE METABOLIC PANEL
ALT: 19 IU/L (ref 0–44)
AST: 24 IU/L (ref 0–40)
Albumin/Globulin Ratio: 1.9 (ref 1.2–2.2)
Albumin: 4.5 g/dL (ref 4.0–5.0)
Alkaline Phosphatase: 96 IU/L (ref 44–121)
BUN/Creatinine Ratio: 15 (ref 9–20)
BUN: 17 mg/dL (ref 6–24)
Bilirubin Total: 0.5 mg/dL (ref 0.0–1.2)
CO2: 21 mmol/L (ref 20–29)
Calcium: 9 mg/dL (ref 8.7–10.2)
Chloride: 103 mmol/L (ref 96–106)
Creatinine, Ser: 1.16 mg/dL (ref 0.76–1.27)
GFR calc Af Amer: 84 mL/min/{1.73_m2} (ref >59)
GFR calc non Af Amer: 73 mL/min/{1.73_m2} (ref >59)
Globulin, Total: 2.4 g/dL (ref 1.5–4.5)
Glucose: 94 mg/dL (ref 65–99)
Potassium: 4.4 mmol/L (ref 3.5–5.2)
Sodium: 140 mmol/L (ref 134–144)
Total Protein: 6.9 g/dL (ref 6.0–8.5)

## 2020-09-29 LAB — URIC ACID: Uric Acid: 4.2 mg/dL (ref 3.8–8.4)

## 2020-09-30 ENCOUNTER — Ambulatory Visit: Payer: Medicaid Other | Admitting: Family Medicine

## 2020-09-30 ENCOUNTER — Encounter: Payer: Self-pay | Admitting: Family Medicine

## 2020-10-03 ENCOUNTER — Other Ambulatory Visit: Payer: Self-pay

## 2020-10-03 ENCOUNTER — Ambulatory Visit
Admission: RE | Admit: 2020-10-03 | Discharge: 2020-10-03 | Disposition: A | Discharge status: Home / Self Care | Payer: Medicaid Other | Source: Ambulatory Visit | Attending: Family Medicine | Admitting: Family Medicine

## 2020-10-03 DIAGNOSIS — N50812 Left testicular pain: Secondary | ICD-10-CM | POA: Diagnosis not present

## 2020-10-05 ENCOUNTER — Ambulatory Visit: Payer: Medicaid Other

## 2020-10-05 ENCOUNTER — Encounter: Payer: Self-pay | Admitting: Family Medicine

## 2020-10-05 ENCOUNTER — Other Ambulatory Visit: Payer: Self-pay

## 2020-10-05 DIAGNOSIS — L209 Atopic dermatitis, unspecified: Secondary | ICD-10-CM

## 2020-10-05 MED ORDER — DUPILUMAB 300 MG/2ML ~~LOC~~ SOAJ
300.0000 mg | Freq: Once | SUBCUTANEOUS | Status: AC
  Administered 2020-10-05: 300 mg via SUBCUTANEOUS

## 2020-10-05 NOTE — Progress Notes (Signed)
Patient here for two week Dupixent injection.   Dupixent 300mg /3mL injected SQ into right arm. Patient tolerated well.   LOT: [id number] EXP: 06/29/2022

## 2020-10-08 ENCOUNTER — Encounter: Payer: Self-pay | Admitting: Family Medicine

## 2020-10-10 ENCOUNTER — Encounter: Payer: Self-pay | Admitting: Family Medicine

## 2020-10-11 ENCOUNTER — Encounter: Payer: Self-pay | Admitting: Family Medicine

## 2020-10-14 ENCOUNTER — Other Ambulatory Visit: Payer: Self-pay | Admitting: Family Medicine

## 2020-10-14 ENCOUNTER — Encounter: Payer: Self-pay | Admitting: Family Medicine

## 2020-10-14 ENCOUNTER — Other Ambulatory Visit: Payer: Self-pay | Admitting: Dermatology

## 2020-10-14 ENCOUNTER — Ambulatory Visit (INDEPENDENT_AMBULATORY_CARE_PROVIDER_SITE_OTHER): Payer: Medicaid Other | Admitting: Family Medicine

## 2020-10-14 ENCOUNTER — Encounter: Payer: Self-pay | Admitting: Dermatology

## 2020-10-14 ENCOUNTER — Other Ambulatory Visit: Payer: Self-pay

## 2020-10-14 VITALS — BP 125/73 | HR 89 | Temp 98.7°F | Resp 16 | Wt 188.8 lb

## 2020-10-14 DIAGNOSIS — N5089 Other specified disorders of the male genital organs: Secondary | ICD-10-CM | POA: Diagnosis not present

## 2020-10-14 DIAGNOSIS — M545 Low back pain, unspecified: Secondary | ICD-10-CM | POA: Diagnosis not present

## 2020-10-14 LAB — URINALYSIS, ROUTINE W REFLEX MICROSCOPIC
Bilirubin, UA: NEGATIVE
Glucose, UA: NEGATIVE
Ketones, UA: NEGATIVE
Leukocytes,UA: NEGATIVE
Nitrite, UA: NEGATIVE
Protein,UA: NEGATIVE
Specific Gravity, UA: 1.02 (ref 1.005–1.030)
Urobilinogen, Ur: 0.2 mg/dL (ref 0.2–1.0)
pH, UA: 6 (ref 5.0–7.5)

## 2020-10-14 LAB — MICROSCOPIC EXAMINATION
Bacteria, UA: NONE SEEN
WBC, UA: NONE SEEN /hpf (ref 0–5)

## 2020-10-14 MED ORDER — CYCLOBENZAPRINE HCL 10 MG PO TABS
10.0000 mg | ORAL_TABLET | Freq: Every day | ORAL | 3 refills | Status: DC
Start: 2020-10-14 — End: 2020-10-24

## 2020-10-14 MED ORDER — NAPROXEN 500 MG PO TABS
500.0000 mg | ORAL_TABLET | Freq: Two times a day (BID) | ORAL | 3 refills | Status: DC
Start: 2020-10-14 — End: 2020-10-14

## 2020-10-14 NOTE — Progress Notes (Signed)
BP 125/73   Pulse 89   Temp 98.7 F (37.1 C) (Oral)   Resp 16   Wt 188 lb 12.8 oz (85.6 kg)   BMI 32.41 kg/m    Subjective:    Patient ID: Jesus Guerrero, male    DOB: Feb 22, 1971, 49 y.o.   MRN: [id number]  HPI: Jesus Guerrero is a 49 y.o. male  Chief Complaint  Patient presents with  . Back Pain   Has not been in to see PT yet. Has been taking his naproxen, has not been helping much. He notes that his pain is now  on his R side low back previously it'd been on the L. It's sharp and aching and has been constant, worse at night. No radiation. He notes that he is worse with movement. Nothing makes it better. X-ray showed arthritis. No sign of kidney stone. He has a history of chronic microscopic hematuria.   Relevant past medical, surgical, family and social history reviewed and updated as indicated. Interim medical history since our last visit reviewed. Allergies and medications reviewed and updated.  Review of Systems  Constitutional: Negative.   Respiratory: Negative.   Cardiovascular: Negative.   Musculoskeletal: Positive for back pain and myalgias. Negative for arthralgias, gait problem, joint swelling, neck pain and neck stiffness.  Skin: Negative.   Neurological: Negative.   Psychiatric/Behavioral: Negative.     Per HPI unless specifically indicated above     Objective:    BP 125/73   Pulse 89   Temp 98.7 F (37.1 C) (Oral)   Resp 16   Wt 188 lb 12.8 oz (85.6 kg)   BMI 32.41 kg/m   Wt Readings from Last 3 Encounters:  10/14/20 188 lb 12.8 oz (85.6 kg)  09/28/20 191 lb 6.4 oz (86.8 kg)  08/26/20 186 lb 3.2 oz (84.5 kg)    Physical Exam Vitals and nursing note reviewed.  Constitutional:      General: He is not in acute distress.    Appearance: Normal appearance. He is not ill-appearing, toxic-appearing or diaphoretic.  HENT:     Head: Normocephalic and atraumatic.     Right Ear: External ear normal.     Left Ear: External ear normal.     Nose:  Nose normal.     Mouth/Throat:     Mouth: Mucous membranes are moist.     Pharynx: Oropharynx is clear.  Eyes:     General: No scleral icterus.       Right eye: No discharge.        Left eye: No discharge.     Extraocular Movements: Extraocular movements intact.     Conjunctiva/sclera: Conjunctivae normal.     Pupils: Pupils are equal, round, and reactive to light.  Cardiovascular:     Rate and Rhythm: Normal rate and regular rhythm.     Pulses: Normal pulses.     Heart sounds: Normal heart sounds. No murmur heard.  No friction rub. No gallop.   Pulmonary:     Effort: Pulmonary effort is normal. No respiratory distress.     Breath sounds: Normal breath sounds. No stridor. No wheezing, rhonchi or rales.  Chest:     Chest wall: No tenderness.  Musculoskeletal:        General: Normal range of motion.     Cervical back: Normal range of motion and neck supple.  Skin:    General: Skin is warm and dry.     Capillary Refill: Capillary refill takes less  than 2 seconds.     Coloration: Skin is not jaundiced or pale.     Findings: No bruising, erythema, lesion or rash.  Neurological:     General: No focal deficit present.     Mental Status: He is alert and oriented to person, place, and time. Mental status is at baseline.  Psychiatric:        Mood and Affect: Mood normal.        Behavior: Behavior normal.        Thought Content: Thought content normal.        Judgment: Judgment normal.     Results for orders placed or performed in visit on 10/14/20  Microscopic Examination   Urine  Result Value Ref Range   WBC, UA None seen 0 - 5 /hpf   RBC 11-30 (A) 0 - 2 /hpf   Epithelial Cells (non renal) 0-10 0 - 10 /hpf   Casts Present None seen /lpf   Cast Type Hyaline casts N/A   Mucus, UA Present Not Estab.   Bacteria, UA None seen None seen/Few  Urinalysis, Routine w reflex microscopic  Result Value Ref Range   Specific Gravity, UA 1.020 1.005 - 1.030   pH, UA 6.0 5.0 - 7.5    Color, UA Yellow Yellow   Appearance Ur Clear Clear   Leukocytes,UA Negative Negative   Protein,UA Negative Negative/Trace   Glucose, UA Negative Negative   Ketones, UA Negative Negative   RBC, UA Trace (A) Negative   Bilirubin, UA Negative Negative   Urobilinogen, Ur 0.2 0.2 - 1.0 mg/dL   Nitrite, UA Negative Negative   Microscopic Examination See below:       Assessment & Plan:   Problem List Items Addressed This Visit    None    Visit Diagnoses    Acute left-sided low back pain without sciatica    -  Primary   Repeat UA shows no sign of hematuria. Seems to be due to mechanical back pain. Will treat with flexeril and naproxen. Encouraged PT. Call with any concerns.    Relevant Medications   cyclobenzaprine (FLEXERIL) 10 MG tablet   Other Relevant Orders   Urinalysis, Routine w reflex microscopic (Completed)   Testicular microlithiasis       Will refer to urology. Referral generated today. Call with any concerns.    Relevant Orders   Ambulatory referral to Urology       Follow up plan: Return in about 2 weeks (around 10/28/2020).

## 2020-10-14 NOTE — Progress Notes (Signed)
error 

## 2020-10-16 ENCOUNTER — Encounter: Payer: Self-pay | Admitting: Family Medicine

## 2020-10-17 ENCOUNTER — Encounter: Payer: Self-pay | Admitting: Family Medicine

## 2020-10-18 ENCOUNTER — Encounter: Payer: Self-pay | Admitting: Urology

## 2020-10-19 ENCOUNTER — Other Ambulatory Visit: Payer: Self-pay

## 2020-10-19 ENCOUNTER — Ambulatory Visit: Payer: Medicaid Other | Admitting: Dermatology

## 2020-10-19 ENCOUNTER — Encounter: Payer: Self-pay | Admitting: Urology

## 2020-10-19 DIAGNOSIS — L905 Scar conditions and fibrosis of skin: Secondary | ICD-10-CM

## 2020-10-19 DIAGNOSIS — L209 Atopic dermatitis, unspecified: Secondary | ICD-10-CM | POA: Diagnosis not present

## 2020-10-19 DIAGNOSIS — L700 Acne vulgaris: Secondary | ICD-10-CM

## 2020-10-19 MED ORDER — CLOBETASOL PROPIONATE 0.05 % EX FOAM: Freq: Two times a day (BID) | CUTANEOUS | 0 refills | Status: DC

## 2020-10-19 MED ORDER — DUPILUMAB 300 MG/2ML ~~LOC~~ SOAJ
300.0000 mg | Freq: Once | SUBCUTANEOUS | Status: AC
  Administered 2020-10-19: 300 mg via SUBCUTANEOUS

## 2020-10-19 MED ORDER — MUPIROCIN 2 % EX OINT: TOPICAL_OINTMENT | CUTANEOUS | 2 refills | Status: DC

## 2020-10-19 MED ORDER — CLINDAMYCIN PHOS-BENZOYL PEROX 1.2-5 % EX GEL: CUTANEOUS | 2 refills | Status: DC

## 2020-10-19 NOTE — Progress Notes (Signed)
   Follow-Up Visit   Subjective  Jesus Guerrero is a 49 y.o. male who presents for the following: Follow-up.  Patient here today for Dupixent injection. He also is having some itching with bumps at his scalp. He has been using clobetasol solution twice daily.   The following portions of the chart were reviewed this encounter and updated as appropriate:  Tobacco  Allergies  Meds  Problems  Med Hx  Surg Hx  Fam Hx      Review of Systems:  No other skin or systemic complaints except as noted in HPI or Assessment and Plan.  Objective  Well appearing patient in no apparent distress; mood and affect are within normal limits.  A focused examination was performed including scalp, face, arms, hands. Relevant physical exam findings are noted in the Assessment and Plan.  Objective  Scalp: Scaly erythematous plaques at dorsal hands, scattered excoriations  Objective  Scalp: Excoriated biopsy proven scar   Assessment & Plan  Atopic dermatitis, unspecified type Scalp  Chronic, not at goal. Will review patch testing results and consider adding back topical steroid on weekends  Cont Dupixent 300mg /mL sq q2wks, tolerating well Cont Eucrisa to hands  Dupixent 300mg /mL injected today to left upper arm. Pt tolerated well.   Recommend NAC 600mg  three times daily  Patient advised to avoid picking.  dupilumab (DUPIXENT) prefilled syringe 300 mg - Scalp  Scar Scalp  Biopsy proven. PAS negative for fungus.   Reporting pruritus. Has failed clobetasol ointment. Start clobetasol foam twice daily  Start mupirocin twice daily to open areas   Ordered Medications: clobetasol (OLUX) 0.05 % topical foam mupirocin ointment (BACTROBAN) 2 %  Return in about 2 weeks (around 11/02/2020).  [name], RMA, am acting as scribe for [name], MD .  Documentation: I have reviewed the above documentation for accuracy and completeness, and I agree with the above.  [name],  MD

## 2020-10-19 NOTE — Patient Instructions (Addendum)
Start clobetasol foam twice daily to spot at scalp.  Start mupirocin twice daily to open areas at scalp.  Recommend N-acetyl cysteine 600mg  three times daily.

## 2020-10-20 ENCOUNTER — Ambulatory Visit: Payer: Self-pay | Admitting: Urology

## 2020-10-20 ENCOUNTER — Encounter: Payer: Self-pay | Admitting: Dermatology

## 2020-10-20 ENCOUNTER — Encounter: Payer: Self-pay | Admitting: Urology

## 2020-10-20 NOTE — Telephone Encounter (Signed)
Please advise 

## 2020-10-23 ENCOUNTER — Other Ambulatory Visit: Payer: Self-pay | Admitting: Family Medicine

## 2020-10-23 NOTE — Telephone Encounter (Signed)
Requested medication (s) are due for refill today: no  Requested medication (s) are on the active medication list: yes  Last refill:  10/14/20 #30 3 RF  Future visit scheduled: yes  Notes to clinic:  NT not delegated to RF or refuse this med   Requested Prescriptions  Pending Prescriptions Disp Refills   cyclobenzaprine (FLEXERIL) 10 MG tablet [Pharmacy Med Name: Cyclobenzaprine HCl 10 MG Oral Tablet] 30 tablet 0    Sig: TAKE 1 TABLET BY MOUTH AT BEDTIME      Not Delegated - Analgesics:  Muscle Relaxants Failed - 10/23/2020  4:18 PM      Failed - This refill cannot be delegated      Passed - Valid encounter within last 6 months    Recent Outpatient Visits           1 week ago Acute left-sided low back pain without sciatica   [place], Megan P, DO   3 weeks ago Chronic gout of multiple sites, unspecified cause   [facility], Megan P, DO   1 month ago Chronic gout of multiple sites, unspecified cause   [facility], Megan P, DO   3 months ago Acute idiopathic gout involving toe of left foot   [place], Megan P, DO   6 months ago Routine general medical examination at a health care facility   [place], [place], DO       Future Appointments             In 4 days Hollice Espy, MD [place]   In 1 week Wynetta Emery, Barb Merino, DO [place], [place]   In 1 week [place], [place], [place]   In 2 weeks [place], [place], MD [place]

## 2020-10-24 ENCOUNTER — Encounter: Payer: Self-pay | Admitting: Family Medicine

## 2020-10-24 MED ORDER — CYCLOBENZAPRINE HCL 10 MG PO TABS
10.0000 mg | ORAL_TABLET | Freq: Every day | ORAL | 3 refills | Status: DC
Start: 2020-10-24 — End: 2020-12-19

## 2020-10-25 ENCOUNTER — Encounter: Payer: Self-pay | Admitting: Family Medicine

## 2020-10-26 ENCOUNTER — Encounter: Payer: Self-pay | Admitting: Dermatology

## 2020-10-27 ENCOUNTER — Other Ambulatory Visit: Payer: Self-pay

## 2020-10-27 ENCOUNTER — Telehealth: Payer: Self-pay | Admitting: Family Medicine

## 2020-10-27 ENCOUNTER — Ambulatory Visit (INDEPENDENT_AMBULATORY_CARE_PROVIDER_SITE_OTHER): Payer: Medicaid Other | Admitting: Urology

## 2020-10-27 ENCOUNTER — Encounter: Payer: Self-pay | Admitting: Family Medicine

## 2020-10-27 ENCOUNTER — Encounter: Payer: Self-pay | Admitting: Urology

## 2020-10-27 VITALS — BP 167/88 | HR 96 | Ht 64.0 in | Wt 191.0 lb

## 2020-10-27 DIAGNOSIS — R3129 Other microscopic hematuria: Secondary | ICD-10-CM | POA: Diagnosis not present

## 2020-10-27 DIAGNOSIS — N5089 Other specified disorders of the male genital organs: Secondary | ICD-10-CM

## 2020-10-27 NOTE — Telephone Encounter (Signed)
Message already sent to provider via [facility]

## 2020-10-27 NOTE — Telephone Encounter (Signed)
Pt called about mychart message about his Naproxen / Pt stated its not helping and wanted to see if something else can help/ please advise

## 2020-10-27 NOTE — Patient Instructions (Signed)
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
## 2020-10-28 ENCOUNTER — Other Ambulatory Visit: Payer: Self-pay | Admitting: Family Medicine

## 2020-10-28 ENCOUNTER — Telehealth: Payer: Self-pay | Admitting: Family Medicine

## 2020-10-28 ENCOUNTER — Encounter: Payer: Self-pay | Admitting: Family Medicine

## 2020-10-28 LAB — URINALYSIS, COMPLETE
Bilirubin, UA: NEGATIVE
Glucose, UA: NEGATIVE
Ketones, UA: NEGATIVE
Leukocytes,UA: NEGATIVE
Nitrite, UA: NEGATIVE
Protein,UA: NEGATIVE
Specific Gravity, UA: 1.02 (ref 1.005–1.030)
Urobilinogen, Ur: 0.2 mg/dL (ref 0.2–1.0)
pH, UA: 7 (ref 5.0–7.5)

## 2020-10-28 LAB — MICROSCOPIC EXAMINATION: Bacteria, UA: NONE SEEN

## 2020-10-28 NOTE — Telephone Encounter (Signed)
Naproxen was sent to the pharmacy.

## 2020-10-28 NOTE — Telephone Encounter (Signed)
Pt did not call the pharm. Pt is trying to get refill on naproxen 500 mg  today. Pt has an appt on 11-01-2020.[facility] [place] on [address]

## 2020-10-29 ENCOUNTER — Encounter: Payer: Self-pay | Admitting: Family Medicine

## 2020-10-30 ENCOUNTER — Encounter: Payer: Self-pay | Admitting: Family Medicine

## 2020-10-30 ENCOUNTER — Encounter: Payer: Self-pay | Admitting: Urology

## 2020-10-31 ENCOUNTER — Other Ambulatory Visit: Payer: Self-pay

## 2020-10-31 NOTE — Telephone Encounter (Signed)
Entered in error

## 2020-11-01 ENCOUNTER — Other Ambulatory Visit: Payer: Self-pay

## 2020-11-01 ENCOUNTER — Ambulatory Visit (INDEPENDENT_AMBULATORY_CARE_PROVIDER_SITE_OTHER): Payer: Medicaid Other | Admitting: Family Medicine

## 2020-11-01 ENCOUNTER — Encounter: Payer: Self-pay | Admitting: Family Medicine

## 2020-11-01 VITALS — BP 159/80 | HR 96 | Temp 98.6°F | Ht 64.0 in | Wt 191.0 lb

## 2020-11-01 DIAGNOSIS — M545 Low back pain, unspecified: Secondary | ICD-10-CM

## 2020-11-01 MED ORDER — KETOROLAC TROMETHAMINE 60 MG/2ML IM SOLN
60.0000 mg | Freq: Once | INTRAMUSCULAR | Status: AC
  Administered 2020-11-01: 60 mg via INTRAMUSCULAR

## 2020-11-01 MED ORDER — NAPROXEN 500 MG PO TABS
ORAL_TABLET | ORAL | 3 refills | Status: DC
Start: 2020-11-01 — End: 2020-12-19

## 2020-11-01 NOTE — Progress Notes (Signed)
BP (!) 159/80   Pulse 96   Temp 98.6 F (37 C) (Oral)   Ht 5\' 4"  (1.626 m)   Wt 191 lb (86.6 kg)   SpO2 97%   BMI 32.79 kg/m    Subjective:    Patient ID: Jesus Guerrero, male    DOB: 1971-07-02, 49 y.o.   MRN: [id number]  HPI: Jesus Guerrero is a 49 y.o. male  Chief Complaint  Patient presents with  . Back Pain    f/u- back still hurts but not as bad    BACK PAIN- has not been doing any exercises, has not contacted PT. Naproxen helping a little bit, but not much.  Duration: about a month to month and a half Mechanism of injury: no trauma Location: L low back Onset: sudden Severity: moderate Quality: sharp and aching Frequency: intermittent Radiation: none Aggravating factors: none Alleviating factors: flexeril Status: better Treatments attempted: rest, APAP and aleve  Relief with NSAIDs?: moderate Nighttime pain:  no Paresthesias / decreased sensation:  no Bowel / bladder incontinence:  no Fevers:  no Dysuria / urinary frequency:  no  Relevant past medical, surgical, family and social history reviewed and updated as indicated. Interim medical history since our last visit reviewed. Allergies and medications reviewed and updated.  Review of Systems  Constitutional: Negative.   Respiratory: Negative.   Cardiovascular: Negative.   Gastrointestinal: Negative.   Musculoskeletal: Positive for back pain and myalgias. Negative for arthralgias, gait problem, joint swelling, neck pain and neck stiffness.  Allergic/Immunologic: Negative.   Neurological: Negative.   Psychiatric/Behavioral: Negative.     Per HPI unless specifically indicated above     Objective:    BP (!) 159/80   Pulse 96   Temp 98.6 F (37 C) (Oral)   Ht 5\' 4"  (1.626 m)   Wt 191 lb (86.6 kg)   SpO2 97%   BMI 32.79 kg/m   Wt Readings from Last 3 Encounters:  11/01/20 191 lb (86.6 kg)  10/27/20 191 lb (86.6 kg)  10/14/20 188 lb 12.8 oz (85.6 kg)    Physical Exam Vitals and nursing  note reviewed.  Constitutional:      General: He is not in acute distress.    Appearance: Normal appearance. He is not ill-appearing, toxic-appearing or diaphoretic.  HENT:     Head: Normocephalic and atraumatic.     Right Ear: External ear normal.     Left Ear: External ear normal.     Nose: Nose normal.     Mouth/Throat:     Mouth: Mucous membranes are moist.     Pharynx: Oropharynx is clear.  Eyes:     General: No scleral icterus.       Right eye: No discharge.        Left eye: No discharge.     Extraocular Movements: Extraocular movements intact.     Conjunctiva/sclera: Conjunctivae normal.     Pupils: Pupils are equal, round, and reactive to light.  Cardiovascular:     Rate and Rhythm: Normal rate and regular rhythm.     Pulses: Normal pulses.     Heart sounds: Normal heart sounds. No murmur heard.  No friction rub. No gallop.   Pulmonary:     Effort: Pulmonary effort is normal. No respiratory distress.     Breath sounds: Normal breath sounds. No stridor. No wheezing, rhonchi or rales.  Chest:     Chest wall: No tenderness.  Musculoskeletal:  General: Normal range of motion.     Cervical back: Normal range of motion and neck supple.  Skin:    General: Skin is warm and dry.     Capillary Refill: Capillary refill takes less than 2 seconds.     Coloration: Skin is not jaundiced or pale.     Findings: No bruising, erythema, lesion or rash.  Neurological:     General: No focal deficit present.     Mental Status: He is alert and oriented to person, place, and time. Mental status is at baseline.  Psychiatric:        Mood and Affect: Mood normal.        Behavior: Behavior normal.        Thought Content: Thought content normal.        Judgment: Judgment normal.     Results for orders placed or performed in visit on 10/27/20  Microscopic Examination   Urine  Result Value Ref Range   WBC, UA 0-5 0 - 5 /hpf   RBC 0-2 0 - 2 /hpf   Epithelial Cells (non renal) 0-10  0 - 10 /hpf   Bacteria, UA None seen None seen/Few  Urinalysis, Complete  Result Value Ref Range   Specific Gravity, UA 1.020 1.005 - 1.030   pH, UA 7.0 5.0 - 7.5   Color, UA Yellow Yellow   Appearance Ur Clear Clear   Leukocytes,UA Negative Negative   Protein,UA Negative Negative/Trace   Glucose, UA Negative Negative   Ketones, UA Negative Negative   RBC, UA Trace (A) Negative   Bilirubin, UA Negative Negative   Urobilinogen, Ur 0.2 0.2 - 1.0 mg/dL   Nitrite, UA Negative Negative   Microscopic Examination See below:       Assessment & Plan:   Problem List Items Addressed This Visit    None    Visit Diagnoses    Acute left-sided low back pain without sciatica    -  Primary   Improving. Will get him into PT and give toradol shot today. Call with any concerns.    Relevant Medications   ketorolac (TORADOL) injection 60 mg (Start on 11/01/2020  9:15 AM)   naproxen (NAPROSYN) 500 MG tablet       Follow up plan: Return in about 6 weeks (around 12/13/2020) for follow up back.

## 2020-11-01 NOTE — Patient Instructions (Addendum)
Physical therapy appointment: 12/06/20 @ 8:00 AM [facility] at [place], [place] [address]   Low Back Sprain or Strain Rehab Ask your health care provider which exercises are safe for you. Do exercises exactly as told by your health care provider and adjust them as directed. It is normal to feel mild stretching, pulling, tightness, or discomfort as you do these exercises. Stop right away if you feel sudden pain or your pain gets worse. Do not begin these exercises until told by your health care provider. Stretching and range-of-motion exercises These exercises warm up your muscles and joints and improve the movement and flexibility of your back. These exercises also help to relieve pain, numbness, and tingling. Lumbar rotation  1. Lie on your back on a firm surface and bend your knees. 2. Straighten your arms out to your sides so each arm forms a 90-degree angle (right angle) with a side of your body. 3. Slowly move (rotate) both of your knees to one side of your body until you feel a stretch in your lower back (lumbar). Try not to let your shoulders lift off the floor. 4. Hold this position for __________ seconds. 5. Tense your abdominal muscles and slowly move your knees back to the starting position. 6. Repeat this exercise on the other side of your body. Repeat __________ times. Complete this exercise __________ times a day. Single knee to chest  1. Lie on your back on a firm surface with both legs straight. 2. Bend one of your knees. Use your hands to move your knee up toward your chest until you feel a gentle stretch in your lower back and buttock. ? Hold your leg in this position by holding on to the front of your knee. ? Keep your other leg as straight as possible. 3. Hold this position for __________ seconds. 4. Slowly return to the starting position. 5. Repeat with your other leg. Repeat __________  times. Complete this exercise __________ times a day. Prone extension on elbows  1. Lie on your abdomen on a firm surface (prone position). 2. Prop yourself up on your elbows. 3. Use your arms to help lift your chest up until you feel a gentle stretch in your abdomen and your lower back. ? This will place some of your body weight on your elbows. If this is uncomfortable, try stacking pillows under your chest. ? Your hips should stay down, against the surface that you are lying on. Keep your hip and back muscles relaxed. 4. Hold this position for __________ seconds. 5. Slowly relax your upper body and return to the starting position. Repeat __________ times. Complete this exercise __________ times a day. Strengthening exercises These exercises build strength and endurance in your back. Endurance is the ability to use your muscles for a long time, even after they get tired. Pelvic tilt This exercise strengthens the muscles that lie deep in the abdomen. 1. Lie on your back on a firm surface. Bend your knees and keep your feet flat on the floor. 2. Tense your abdominal muscles. Tip your pelvis up toward the ceiling and flatten your lower back into the floor. ? To help with this exercise, you may place a small towel under your lower back and try to push your back into the towel. 3. Hold this position for __________ seconds. 4. Let your muscles relax completely before you repeat this exercise. Repeat __________ times. Complete this exercise __________ times a day.  Alternating arm and leg raises  1. Get on your hands and knees on a firm surface. If you are on a hard floor, you may want to use padding, such as an exercise mat, to cushion your knees. 2. Line up your arms and legs. Your hands should be directly below your shoulders, and your knees should be directly below your hips. 3. Lift your left leg behind you. At the same time, raise your right arm and straighten it in front of you. ? Do not  lift your leg higher than your hip. ? Do not lift your arm higher than your shoulder. ? Keep your abdominal and back muscles tight. ? Keep your hips facing the ground. ? Do not arch your back. ? Keep your balance carefully, and do not hold your breath. 4. Hold this position for __________ seconds. 5. Slowly return to the starting position. 6. Repeat with your right leg and your left arm. Repeat __________ times. Complete this exercise __________ times a day. Abdominal set with straight leg raise  1. Lie on your back on a firm surface. 2. Bend one of your knees and keep your other leg straight. 3. Tense your abdominal muscles and lift your straight leg up, 4-6 inches (10-15 cm) off the ground. 4. Keep your abdominal muscles tight and hold this position for __________ seconds. ? Do not hold your breath. ? Do not arch your back. Keep it flat against the ground. 5. Keep your abdominal muscles tense as you slowly lower your leg back to the starting position. 6. Repeat with your other leg. Repeat __________ times. Complete this exercise __________ times a day. Single leg lower with bent knees 1. Lie on your back on a firm surface. 2. Tense your abdominal muscles and lift your feet off the floor, one foot at a time, so your knees and hips are bent in 90-degree angles (right angles). ? Your knees should be over your hips and your lower legs should be parallel to the floor. 3. Keeping your abdominal muscles tense and your knee bent, slowly lower one of your legs so your toe touches the ground. 4. Lift your leg back up to return to the starting position. ? Do not hold your breath. ? Do not let your back arch. Keep your back flat against the ground. 5. Repeat with your other leg. Repeat __________ times. Complete this exercise __________ times a day. Posture and body mechanics Good posture and healthy body mechanics can help to relieve stress in your body's tissues and joints. Body mechanics  refers to the movements and positions of your body while you do your daily activities. Posture is part of body mechanics. Good posture means:  Your spine is in its natural S-curve position (neutral).  Your shoulders are pulled back slightly.  Your head is not tipped forward. Follow these guidelines to improve your posture and body mechanics in your everyday activities. Standing   When standing, keep your spine neutral and your feet about hip width apart. Keep a slight bend in your knees. Your ears, shoulders, and hips should line up.  When you do a task in which you stand in one place for a long time, place one foot up on a stable object that is 2-4 inches (5-10 cm) high, such as a footstool. This helps keep your spine neutral. Sitting   When sitting, keep your spine neutral and keep your feet flat on the floor. Use a footrest, if necessary, and keep your thighs parallel  to the floor. Avoid rounding your shoulders, and avoid tilting your head forward.  When working at a desk or a computer, keep your desk at a height where your hands are slightly lower than your elbows. Slide your chair under your desk so you are close enough to maintain good posture.  When working at a computer, place your monitor at a height where you are looking straight ahead and you do not have to tilt your head forward or downward to look at the screen. Resting  When lying down and resting, avoid positions that are most painful for you.  If you have pain with activities such as sitting, bending, stooping, or squatting, lie in a position in which your body does not bend very much. For example, avoid curling up on your side with your arms and knees near your chest (fetal position).  If you have pain with activities such as standing for a long time or reaching with your arms, lie with your spine in a neutral position and bend your knees slightly. Try the following positions: ? Lying on your side with a pillow between  your knees. ? Lying on your back with a pillow under your knees. Lifting   When lifting objects, keep your feet at least shoulder width apart and tighten your abdominal muscles.  Bend your knees and hips and keep your spine neutral. It is important to lift using the strength of your legs, not your back. Do not lock your knees straight out.  Always ask for help to lift heavy or awkward objects. This information is not intended to replace advice given to you by your health care provider. Make sure you discuss any questions you have with your health care provider. Document Revised: 04/10/2019 Document Reviewed: 01/08/2019 Elsevier Patient Education  [place].

## 2020-11-02 ENCOUNTER — Encounter: Payer: Self-pay | Admitting: Dermatology

## 2020-11-02 ENCOUNTER — Other Ambulatory Visit: Payer: Self-pay

## 2020-11-02 ENCOUNTER — Ambulatory Visit: Payer: Medicaid Other | Admitting: Dermatology

## 2020-11-02 DIAGNOSIS — L239 Allergic contact dermatitis, unspecified cause: Secondary | ICD-10-CM | POA: Diagnosis not present

## 2020-11-02 DIAGNOSIS — L905 Scar conditions and fibrosis of skin: Secondary | ICD-10-CM | POA: Diagnosis not present

## 2020-11-02 DIAGNOSIS — L209 Atopic dermatitis, unspecified: Secondary | ICD-10-CM

## 2020-11-02 MED ORDER — TACROLIMUS 0.1 % EX OINT: TOPICAL_OINTMENT | CUTANEOUS | 0 refills | Status: DC

## 2020-11-02 MED ORDER — DUPILUMAB 300 MG/2ML ~~LOC~~ SOAJ
300.0000 mg | Freq: Once | SUBCUTANEOUS | Status: AC
  Administered 2020-11-02: 300 mg via SUBCUTANEOUS

## 2020-11-02 MED ORDER — MUPIROCIN 2 % EX OINT: TOPICAL_OINTMENT | CUTANEOUS | 0 refills | Status: DC

## 2020-11-02 NOTE — Progress Notes (Signed)
   Follow-Up Visit   Subjective  Jesus Guerrero is a 49 y.o. male who presents for the following: Follow-up (follow up for atopic dermatitis and dupixent injection. ).  He reports he's doing well. Hands are still bothersome. Scalp is doing ok.    The following portions of the chart were reviewed this encounter and updated as appropriate: Tobacco  Allergies  Meds  Problems  Med Hx  Surg Hx  Fam Hx      Review of Systems: No other skin or systemic complaints except as noted in HPI or Assessment and Plan.   Objective  Well appearing patient in no apparent distress; mood and affect are within normal limits.  A focused examination was performed including scalp, hands, face, and arms. Relevant physical exam findings are noted in the Assessment and Plan.  Objective  Head - Anterior (Face): Scaly pink plaques and fissures  Few thin scaly pink plaques and ears, excoriated.  Objective  Head - Anterior (Face): Scaly erythematous plaques at hands  Objective  vertex Scalp: Biopsy proven scar  Assessment & Plan  Atopic dermatitis, unspecified type Head - Anterior (Face)  Chronic, improved but not at goal. Flared at hands currently.   Continue Dupixent.   Dupixent 300 mg pen injected into right upper arm today.   D/c Eucrisa.   Start Tacrolimus 0.1% BID to affected areas.   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 test and had 1+ reaction to Neomycin. No other reactions.  Given persistent rash on dosal hands (DDX atopic dermatitis vs ACD) will refer to [facility]  patch testing clinic for more extensive evaluation.   Continue avoidance of neomycin.    Other Related Procedures Ambulatory referral to Dermatology  Scar vertex Scalp  Doing well.  Continue clobetasol foam daily once or twice daily as needed for severe itch. Avoid applying to face, groin, and axilla. Use as directed. Risk of skin atrophy with long-term use reviewed.   Recommend [place] Rapid Relief Anti-Itch cream up to 3 times per day to areas that are itchy.   Other Related Medications clobetasol (OLUX) 0.05 % topical foam  Return in about 2 weeks (around 11/16/2020) for 2 wk f/u with Dr. Laurence Ferrari.   I, Harriett Sine, CMA, am acting as scribe for [name], MD.  Documentation: I have reviewed the above documentation for accuracy and completeness, and I agree with the above.  [name], MD

## 2020-11-03 ENCOUNTER — Other Ambulatory Visit: Payer: Self-pay | Admitting: Dermatology

## 2020-11-03 ENCOUNTER — Encounter: Payer: Self-pay | Admitting: Dermatology

## 2020-11-03 DIAGNOSIS — L905 Scar conditions and fibrosis of skin: Secondary | ICD-10-CM

## 2020-11-07 ENCOUNTER — Other Ambulatory Visit: Payer: Self-pay

## 2020-11-07 ENCOUNTER — Other Ambulatory Visit: Payer: Self-pay | Admitting: Dermatology

## 2020-11-07 DIAGNOSIS — L905 Scar conditions and fibrosis of skin: Secondary | ICD-10-CM

## 2020-11-07 MED ORDER — CLOBETASOL PROPIONATE 0.05 % EX FOAM: CUTANEOUS | 1 refills | Status: DC

## 2020-11-09 ENCOUNTER — Encounter: Payer: Self-pay | Admitting: Urology

## 2020-11-09 ENCOUNTER — Other Ambulatory Visit: Payer: Self-pay | Admitting: Dermatology

## 2020-11-09 ENCOUNTER — Ambulatory Visit (INDEPENDENT_AMBULATORY_CARE_PROVIDER_SITE_OTHER): Payer: Medicaid Other | Admitting: Urology

## 2020-11-09 ENCOUNTER — Ambulatory Visit: Payer: Medicaid Other | Admitting: Dermatology

## 2020-11-09 ENCOUNTER — Other Ambulatory Visit: Payer: Self-pay

## 2020-11-09 VITALS — BP 175/89 | HR 99

## 2020-11-09 DIAGNOSIS — R3129 Other microscopic hematuria: Secondary | ICD-10-CM | POA: Diagnosis not present

## 2020-11-09 NOTE — Progress Notes (Signed)
   11/09/20  CC: No chief complaint on file.   HPI: 49 year old male who presents today for cystoscopic evaluation for microscopic hematuria work-up. Please see previous notes for details.  Blood pressure (!) 175/89, pulse 99. NED. A&Ox3.   No respiratory distress   Abd soft, NT, ND Normal phallus with bilateral descended testicles  Cystoscopy Procedure Note  Patient identification was confirmed, informed consent was obtained, and patient was prepped using Betadine solution.  Lidocaine jelly was administered per urethral meatus.     Pre-Procedure: - Inspection reveals a normal caliber ureteral meatus.  Procedure: The flexible cystoscope was introduced without difficulty - No urethral strictures/lesions are present. - Normal prostate  - Normal bladder neck - Bilateral ureteral orifices identified - Bladder mucosa  reveals no ulcers, tumors, or lesions - No bladder stones - No trabeculation  Retroflexion shows unremarkable   Post-Procedure: - Patient tolerated the procedure well  Assessment/ Plan:  1. Microscopic hematuria Cystoscopy today was unremarkable  Given multiple previous scans, will hold off on doing upper tract imaging . Patient is agreeable this plan. All questions answered.  We will have him return in about a year for urine recheck. - Urinalysis, Complete   Hollice Espy, MD

## 2020-11-10 ENCOUNTER — Encounter: Payer: Self-pay | Admitting: Urology

## 2020-11-10 ENCOUNTER — Encounter: Payer: Self-pay | Admitting: Family Medicine

## 2020-11-10 ENCOUNTER — Encounter: Payer: Self-pay | Admitting: Dermatology

## 2020-11-10 LAB — MICROSCOPIC EXAMINATION: Bacteria, UA: NONE SEEN

## 2020-11-10 LAB — URINALYSIS, COMPLETE
Bilirubin, UA: NEGATIVE
Glucose, UA: NEGATIVE
Ketones, UA: NEGATIVE
Leukocytes,UA: NEGATIVE
Nitrite, UA: NEGATIVE
Protein,UA: NEGATIVE
Specific Gravity, UA: 1.025 (ref 1.005–1.030)
Urobilinogen, Ur: 0.2 mg/dL (ref 0.2–1.0)
pH, UA: 6.5 (ref 5.0–7.5)

## 2020-11-14 ENCOUNTER — Other Ambulatory Visit: Payer: Self-pay

## 2020-11-14 DIAGNOSIS — L239 Allergic contact dermatitis, unspecified cause: Secondary | ICD-10-CM

## 2020-11-14 MED ORDER — KETOCONAZOLE 2 % EX SHAM: MEDICATED_SHAMPOO | CUTANEOUS | 3 refills | Status: DC

## 2020-11-16 ENCOUNTER — Other Ambulatory Visit: Payer: Self-pay | Admitting: Family Medicine

## 2020-11-17 ENCOUNTER — Ambulatory Visit: Payer: Medicaid Other | Admitting: Dermatology

## 2020-11-17 ENCOUNTER — Other Ambulatory Visit: Payer: Self-pay

## 2020-11-17 DIAGNOSIS — L2081 Atopic neurodermatitis: Secondary | ICD-10-CM

## 2020-11-17 DIAGNOSIS — L2089 Other atopic dermatitis: Secondary | ICD-10-CM

## 2020-11-17 DIAGNOSIS — L2389 Allergic contact dermatitis due to other agents: Secondary | ICD-10-CM | POA: Diagnosis not present

## 2020-11-17 MED ORDER — DUPILUMAB 300 MG/2ML ~~LOC~~ SOSY
300.0000 mg | PREFILLED_SYRINGE | Freq: Once | SUBCUTANEOUS | Status: AC
  Administered 2020-11-17: 300 mg via SUBCUTANEOUS

## 2020-11-17 NOTE — Patient Instructions (Addendum)
Continue tacrolimus to hands twice daily.   Recommend Neutrogena [place] hand cream after washing hands and as needed.   Recommend Gold Bond Rapid Relief Anti-Itch cream up to 3 times per day to areas that are itchy.  May also use Voltaren gel which is over the counter if Gold Bond not helping with itch.   Could also try Capsaicin cream several times per day to itchy areas.  Discontinue clobetasol foam to scalp and other itchy areas and use Eucrisa in the morning and tacrolimus in the evening.  Dupilumab (Dupixent) is a treatment given by injection for adults with moderate-to-severe atopic dermatitis. Goal is control of skin condition, not cure. It is given as 2 injections at the first dose followed by 1 injection ever 2 weeks thereafter.  Potential side effects include allergic reaction, herpes infections, injection site reactions and conjunctivitis (inflammation of the eyes).  The use of Dupixent requires long term medication management, including periodic office visits.

## 2020-11-17 NOTE — Progress Notes (Signed)
Follow-Up Visit   Subjective  Jesus Guerrero is a 49 y.o. male who presents for the following: Follow-up.  Patient here today for 2 week Dupixent injection and atopic neurodermatitis follow up. He is also using tacrolimus to hands twice daily.  Patient also being seen for itching at the scalp. He is using ketoconazole 2% shampoo every day and clobetasol foam twice daily. Still complains of itching.  Patient also has itching at elbows. He uses [employer] and Free & Clear laundry detergent.  The following portions of the chart were reviewed this encounter and updated as appropriate:  Tobacco  Allergies  Meds  Problems  Med Hx  Surg Hx  Fam Hx      Review of Systems:  No other skin or systemic complaints except as noted in HPI or Assessment and Plan.  Objective  Well appearing patient in no apparent distress; mood and affect are within normal limits.  A focused examination was performed including scalp, face, arms, hands. Relevant physical exam findings are noted in the Assessment and Plan.  Objective  Right Upper Arm - Anterior: Scaly pink plaques at dorsal hands and scalp, scattered excoriations on arms and scalp   Assessment & Plan  Other atopic dermatitis Right Upper Arm - Anterior  Chronic, not currently at goal.    Continue Dupixent. Tolerating well. Dupilumab (Dupixent) is a treatment given by injection for adults with moderate-to-severe atopic dermatitis. Goal is control of skin condition, not cure. Potential side effects include allergic reaction, herpes infections, injection site reactions and conjunctivitis (inflammation of the eyes).  The use of Dupixent requires long term medication management, including periodic office visits.  Start N-acetylcysteine 600 mg three times per week  Some improvement to hands on tacrolimus instead of Eucrisa.  Continue tacrolimus to hands twice daily.   Recommend Neutrogena [place] hand cream after washing hands and as  needed.   Recommend Gold Bond Rapid Relief Anti-Itch cream up to 3 times per day to areas that are itchy.  May also use Voltaren gel which is over the counter if Gold Bond not helping with itch.   Could also try Capsaicin cream several times per day to itchy areas.  Discontinue clobetasol foam to scalp and other itchy areas to prevent atrophy and use Eucrisa in the morning and tacrolimus in the evening.  Dupixent 300 mg pen injected into left upper arm today.   Allergic contact dermatitis due to other agents Right Dorsal Hand  Allergic contact dermatitis to Neomycin. Pt denies current exposure. Recommended more comprehensive patch testing at [facility] but patient is unable to get transportation at this time so will defer.  However, unclear to what extent current dermatitis is all atopic dermatitis vs possible component of ACD to unknown allergen.   Some improvement to hands on tacrolimus instead of Eucrisa.  Continue tacrolimus to hands twice daily.   Recommend Neutrogena [place] hand cream after washing hands and as needed.   Recommend Gold Bond Rapid Relief Anti-Itch cream up to 3 times per day to areas that are itchy.  May also use Voltaren gel which is over the counter if Gold Bond not helping with itch.   Could also try Capsaicin cream several times per day to itchy areas.  Return in about 2 weeks (around 12/01/2020).  [name], RMA, am acting as scribe for [name], MD .  Documentation: I have reviewed the above documentation for accuracy and completeness, and I agree with the above.  [name], MD

## 2020-11-19 ENCOUNTER — Encounter: Payer: Self-pay | Admitting: Dermatology

## 2020-11-25 ENCOUNTER — Encounter (INDEPENDENT_AMBULATORY_CARE_PROVIDER_SITE_OTHER): Payer: Self-pay

## 2020-11-29 ENCOUNTER — Encounter: Payer: Self-pay | Admitting: Family Medicine

## 2020-11-29 ENCOUNTER — Encounter: Payer: Self-pay | Admitting: Dermatology

## 2020-11-29 ENCOUNTER — Other Ambulatory Visit: Payer: Self-pay

## 2020-11-29 ENCOUNTER — Ambulatory Visit: Payer: Medicaid Other | Admitting: Dermatology

## 2020-11-29 DIAGNOSIS — L2089 Other atopic dermatitis: Secondary | ICD-10-CM

## 2020-11-29 NOTE — Progress Notes (Signed)
   Follow-Up Visit   Subjective  Jesus Guerrero is a 49 y.o. male who presents for the following: Dermatitis (2 weeks f/u on Atopic dermatitis pt here for Dupixent injection, no concerns skin seems to be improving ).   The following portions of the chart were reviewed this encounter and updated as appropriate:  Tobacco  Allergies  Meds  Problems  Med Hx  Surg Hx  Fam Hx      Review of Systems:  No other skin or systemic complaints except as noted in HPI or Assessment and Plan.  Objective  Well appearing patient in no apparent distress; mood and affect are within normal limits.  A focused examination was performed including face,exts, trunk . Relevant physical exam findings are noted in the Assessment and Plan.  Objective  scalp, hands: Scaly erythematous plaques at dorsal hands, scattered excoriations   Assessment & Plan  Other atopic dermatitis scalp, hands  Dupixent 300mg /mL injected today to right upper arm. Pt tolerated well.  Lot  [id number] 08/30/2022  Injection only visit  Return in about 2 weeks (around 12/13/2020) for [place] .  I, Marye Round, CMA, am acting as scribe for [name], MD .  Documentation: I have reviewed the above documentation for accuracy and completeness, and I agree with the above.  [name], MD

## 2020-11-30 ENCOUNTER — Encounter: Payer: Self-pay | Admitting: Dermatology

## 2020-12-04 ENCOUNTER — Encounter: Payer: Self-pay | Admitting: Dermatology

## 2020-12-06 ENCOUNTER — Ambulatory Visit: Payer: Medicaid Other | Attending: Family Medicine

## 2020-12-06 ENCOUNTER — Other Ambulatory Visit: Payer: Self-pay

## 2020-12-06 DIAGNOSIS — M545 Low back pain, unspecified: Secondary | ICD-10-CM | POA: Diagnosis not present

## 2020-12-06 DIAGNOSIS — M6281 Muscle weakness (generalized): Secondary | ICD-10-CM | POA: Diagnosis present

## 2020-12-06 DIAGNOSIS — G8929 Other chronic pain: Secondary | ICD-10-CM | POA: Insufficient documentation
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
## 2020-12-08 ENCOUNTER — Encounter: Payer: Self-pay | Admitting: Family Medicine

## 2020-12-11 ENCOUNTER — Encounter: Payer: Self-pay | Admitting: Dermatology

## 2020-12-12 ENCOUNTER — Encounter: Payer: Self-pay | Admitting: Family Medicine

## 2020-12-12 ENCOUNTER — Other Ambulatory Visit: Payer: Self-pay | Admitting: Family Medicine

## 2020-12-13 ENCOUNTER — Other Ambulatory Visit: Payer: Self-pay

## 2020-12-13 ENCOUNTER — Ambulatory Visit: Payer: Medicaid Other

## 2020-12-13 ENCOUNTER — Ambulatory Visit: Payer: Medicaid Other | Admitting: Dermatology

## 2020-12-13 DIAGNOSIS — M545 Low back pain, unspecified: Secondary | ICD-10-CM | POA: Diagnosis not present

## 2020-12-13 DIAGNOSIS — M6281 Muscle weakness (generalized): Secondary | ICD-10-CM
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
## 2020-12-13 NOTE — Patient Instructions (Addendum)
Standing R lateral shift correction 10x3 with 5 second holds   Reviewed and given as part of his HEP. Pt demonstrated and verbalized understanding. Handout provided.   Access Code: [email or web address] URL: [email or web address] Date: 12/13/2020 Prepared by: Joneen Boers  Exercises Seated Scapular Retraction - 1 x daily - 7 x weekly - 3 sets - 10 reps - 5 seconds hold

## 2020-12-14 ENCOUNTER — Other Ambulatory Visit: Payer: Self-pay

## 2020-12-14 ENCOUNTER — Ambulatory Visit: Payer: Medicaid Other | Admitting: Dermatology

## 2020-12-14 DIAGNOSIS — T148XXA Other injury of unspecified body region, initial encounter: Secondary | ICD-10-CM

## 2020-12-14 DIAGNOSIS — L905 Scar conditions and fibrosis of skin: Secondary | ICD-10-CM | POA: Diagnosis not present

## 2020-12-14 DIAGNOSIS — L209 Atopic dermatitis, unspecified: Secondary | ICD-10-CM | POA: Diagnosis not present

## 2020-12-14 DIAGNOSIS — S0081XA Abrasion of other part of head, initial encounter: Secondary | ICD-10-CM

## 2020-12-14 MED ORDER — DUPILUMAB 300 MG/2ML ~~LOC~~ SOAJ
300.0000 mg | Freq: Once | SUBCUTANEOUS | Status: AC
  Administered 2020-12-14: 300 mg via SUBCUTANEOUS

## 2020-12-14 MED ORDER — WINLEVI 1 % EX CREA: TOPICAL_CREAM | CUTANEOUS | 2 refills | Status: DC

## 2020-12-14 NOTE — Progress Notes (Addendum)
° °  Follow-Up Visit   Subjective  Jesus Guerrero is a 49 y.o. male who presents for the following: Follow-up (Patient follow up on atopic dermatitis on scalp and hands. Last seen on 11/30/Patient advises his forehead has been broken out for a few weeks and that it hurts, he is using clindamycin twice daily with very little improvement. ).  Patient using Eucrisa and tacrolimus at hands.   The following portions of the chart were reviewed this encounter and updated as appropriate:  Tobacco   Allergies   Meds   Problems   Med Hx   Surg Hx   Fam Hx        Objective  Well appearing patient in no apparent distress; mood and affect are within normal limits.  A focused examination was performed including scalp, hands, arms, face. Relevant physical exam findings are noted in the Assessment and Plan.  Objective  Scalp, hands: Scaly pink papules coalescing to plaques   Objective  Right Vertex Scalp: Hypertrophic   Objective  forehead: excoriation  Assessment & Plan  Atopic dermatitis, unspecified type Scalp, hands  Chronic condition with expected duration over one year. There is no cure, only control. Condition is bothersome to patient. Currently flared still at face and hands.   Atopic dermatitis (eczema) is a chronic, relapsing, pruritic condition that can significantly affect quality of life. It is often associated with allergic rhinitis and/or asthma and can require treatment with topical medications, phototherapy, or in severe cases a biologic medication called Dupixent in older children and adults.   Continue Dupixent 300mg /mL sq q2weeks.  Dupixent 300mg /mL injected sq today to Left upper arm Tolerating well, no conjunctivitis or injection site reactions  Start Opzelura prescription to stubborn areas at hands, face and scalp twice a day. Do not get in eyes. Wash hands before eating.   Continue Eucrisa in the morning and tacrolimus in the evening.  Other Related  Medications dupilumab (DUPIXENT) prefilled syringe 300 mg tacrolimus (PROTOPIC) 0.1 % ointment mupirocin ointment (BACTROBAN) 2 %  Scar Right Vertex Scalp  Intralesional injection - Right Vertex Scalp Location: right vertex scalp  Informed Consent: Discussed risks (infection, pain, bleeding, bruising, thinning of the skin, loss of skin pigment, lack of resolution, and recurrence of lesion) and benefits of the procedure, as well as the alternatives. Informed consent was obtained. Preparation: The area was prepared a standard fashion.  Procedure Details: An intralesional injection was performed with Kenalog 10 mg/cc. 0.1 cc in total were injected.  Total number of injections: 1  Plan: The patient was instructed on post-op care. Recommend OTC analgesia as needed for pain.   Excoriation forehead  Continue mupirocin to open areas daily and cover with bandaid.   Return in about 2 weeks (around 12/28/2020) for with nurse, 1 month with Dr. Laurence Ferrari.   Documentation: I have reviewed the above documentation for accuracy and completeness, and I agree with the above.  [name], MD

## 2020-12-14 NOTE — Patient Instructions (Addendum)
Start Winlevi stubborn areas at hands, face and scalp twice a day. Do not get in eyes. Wash hands before eating.    Continue mupirocin to open areas once daily and cover with bandaid.   Discontinue clobetasol foam.   Continue Eucrisa in the morning and tacrolimus in the evening.  Dupilumab (Dupixent) is a treatment given by injection for adults with moderate-to-severe atopic dermatitis. Goal is control of skin condition, not cure. It is given as 2 injections at the first dose followed by 1 injection ever 2 weeks thereafter.  Potential side effects include allergic reaction, herpes infections, injection site reactions and conjunctivitis (inflammation of the eyes).  The use of Dupixent requires long term medication management, including periodic office visits.

## 2020-12-15 ENCOUNTER — Ambulatory Visit: Payer: Medicaid Other

## 2020-12-15 DIAGNOSIS — M545 Low back pain, unspecified: Secondary | ICD-10-CM

## 2020-12-15 DIAGNOSIS — M6281 Muscle weakness (generalized): Secondary | ICD-10-CM
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
## 2020-12-15 NOTE — Patient Instructions (Signed)
Access Code: [email or web address] URL: [email or web address] Date: 12/15/2020 Prepared by: Joneen Boers  Exercises Seated Scapular Retraction - 1 x daily - 7 x weekly - 3 sets - 10 reps - 5 seconds hold Prone on Elbows Stretch - 2 x daily - 7 x weekly - 1 sets - 2 reps - 2 minutes hold

## 2020-12-16 ENCOUNTER — Encounter: Payer: Self-pay | Admitting: Family Medicine

## 2020-12-19 ENCOUNTER — Encounter: Payer: Self-pay | Admitting: Family Medicine

## 2020-12-19 ENCOUNTER — Other Ambulatory Visit: Payer: Self-pay

## 2020-12-19 ENCOUNTER — Ambulatory Visit (INDEPENDENT_AMBULATORY_CARE_PROVIDER_SITE_OTHER): Payer: Medicaid Other | Admitting: Family Medicine

## 2020-12-19 VITALS — BP 137/76 | HR 91 | Temp 98.6°F | Wt 192.0 lb

## 2020-12-19 DIAGNOSIS — M545 Low back pain, unspecified: Secondary | ICD-10-CM

## 2020-12-19 MED ORDER — CYCLOBENZAPRINE HCL 10 MG PO TABS
10.0000 mg | ORAL_TABLET | Freq: Every day | ORAL | 3 refills | Status: DC
Start: 2020-12-19 — End: 2021-06-28

## 2020-12-19 MED ORDER — NAPROXEN 500 MG PO TABS: ORAL_TABLET | ORAL | 3 refills | Status: DC

## 2020-12-19 NOTE — Progress Notes (Signed)
BP 137/76   Pulse 91   Temp 98.6 F (37 C)   Wt 192 lb (87.1 kg)   SpO2 96%   BMI 32.96 kg/m    Subjective:    Patient ID: Jesus Guerrero, male    DOB: 1971/08/06, 49 y.o.   MRN: [id number]  HPI: Jesus Guerrero is a 49 y.o. male  Chief Complaint  Patient presents with  . Back Pain    Pt states back pain is doing better    He is doing really well. He has started PT. His back is doing a lot better. He is feeling more like himself. No other concerns or complaints at this time.   Relevant past medical, surgical, family and social history reviewed and updated as indicated. Interim medical history since our last visit reviewed. Allergies and medications reviewed and updated.  Review of Systems  Constitutional: Negative.   Respiratory: Negative.   Cardiovascular: Negative.   Gastrointestinal: Negative.   Musculoskeletal: Negative for arthralgias, back pain, gait problem, joint swelling, myalgias, neck pain and neck stiffness.  Psychiatric/Behavioral: Negative.     Per HPI unless specifically indicated above     Objective:    BP 137/76   Pulse 91   Temp 98.6 F (37 C)   Wt 192 lb (87.1 kg)   SpO2 96%   BMI 32.96 kg/m   Wt Readings from Last 3 Encounters:  12/19/20 192 lb (87.1 kg)  11/01/20 191 lb (86.6 kg)  10/27/20 191 lb (86.6 kg)    Physical Exam Vitals and nursing note reviewed.  Constitutional:      General: He is not in acute distress.    Appearance: Normal appearance. He is not ill-appearing, toxic-appearing or diaphoretic.  HENT:     Head: Normocephalic and atraumatic.     Right Ear: External ear normal.     Left Ear: External ear normal.     Nose: Nose normal.     Mouth/Throat:     Mouth: Mucous membranes are moist.     Pharynx: Oropharynx is clear.  Eyes:     General: No scleral icterus.       Right eye: No discharge.        Left eye: No discharge.     Extraocular Movements: Extraocular movements intact.     Conjunctiva/sclera:  Conjunctivae normal.     Pupils: Pupils are equal, round, and reactive to light.  Cardiovascular:     Rate and Rhythm: Normal rate and regular rhythm.     Pulses: Normal pulses.     Heart sounds: Normal heart sounds. No murmur heard. No friction rub. No gallop.   Pulmonary:     Effort: Pulmonary effort is normal. No respiratory distress.     Breath sounds: Normal breath sounds. No stridor. No wheezing, rhonchi or rales.  Chest:     Chest wall: No tenderness.  Musculoskeletal:        General: Normal range of motion.     Cervical back: Normal range of motion and neck supple.  Skin:    General: Skin is warm and dry.     Capillary Refill: Capillary refill takes less than 2 seconds.     Coloration: Skin is not jaundiced or pale.     Findings: No bruising, erythema, lesion or rash.  Neurological:     General: No focal deficit present.     Mental Status: He is alert and oriented to person, place, and time. Mental status is at baseline.  Psychiatric:        Mood and Affect: Mood normal.        Behavior: Behavior normal.        Thought Content: Thought content normal.        Judgment: Judgment normal.     Results for orders placed or performed in visit on 11/09/20  Microscopic Examination   Urine  Result Value Ref Range   WBC, UA 0-5 0 - 5 /hpf   RBC 3-10 (A) 0 - 2 /hpf   Epithelial Cells (non renal) 0-10 0 - 10 /hpf   Casts Present (A) None seen /lpf   Cast Type Hyaline casts N/A   Bacteria, UA None seen None seen/Few  Urinalysis, Complete  Result Value Ref Range   Specific Gravity, UA 1.025 1.005 - 1.030   pH, UA 6.5 5.0 - 7.5   Color, UA Yellow Yellow   Appearance Ur Clear Clear   Leukocytes,UA Negative Negative   Protein,UA Negative Negative/Trace   Glucose, UA Negative Negative   Ketones, UA Negative Negative   RBC, UA Trace (A) Negative   Bilirubin, UA Negative Negative   Urobilinogen, Ur 0.2 0.2 - 1.0 mg/dL   Nitrite, UA Negative Negative   Microscopic Examination  See below:       Assessment & Plan:   Problem List Items Addressed This Visit   None   Visit Diagnoses    Acute left-sided low back pain without sciatica    -  Primary   Doing well. Has started PT. Continue to follow. Call with any concerns.    Relevant Medications   cyclobenzaprine (FLEXERIL) 10 MG tablet   naproxen (NAPROSYN) 500 MG tablet       Follow up plan: Return in about 3 months (around 03/19/2021).

## 2020-12-20 ENCOUNTER — Ambulatory Visit: Payer: Medicaid Other

## 2020-12-20 ENCOUNTER — Encounter: Payer: Self-pay | Admitting: Dermatology

## 2020-12-20 DIAGNOSIS — M6281 Muscle weakness (generalized): Secondary | ICD-10-CM

## 2020-12-20 DIAGNOSIS — G8929 Other chronic pain: Secondary | ICD-10-CM

## 2020-12-20 DIAGNOSIS — M545 Low back pain, unspecified: Secondary | ICD-10-CM | POA: Diagnosis not present
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
## 2020-12-21 ENCOUNTER — Encounter: Payer: Self-pay | Admitting: Dermatology

## 2020-12-21 ENCOUNTER — Encounter: Payer: Self-pay | Admitting: Family Medicine

## 2020-12-22 ENCOUNTER — Ambulatory Visit: Payer: Medicaid Other

## 2020-12-22 ENCOUNTER — Encounter: Payer: Self-pay | Admitting: Dermatology

## 2020-12-25 ENCOUNTER — Encounter: Payer: Self-pay | Admitting: Dermatology

## 2020-12-26 ENCOUNTER — Encounter: Payer: Self-pay | Admitting: Dermatology

## 2020-12-26 ENCOUNTER — Encounter: Payer: Self-pay | Admitting: Family Medicine

## 2020-12-27 ENCOUNTER — Other Ambulatory Visit: Payer: Self-pay

## 2020-12-27 ENCOUNTER — Ambulatory Visit: Payer: Medicaid Other

## 2020-12-27 DIAGNOSIS — M6281 Muscle weakness (generalized): Secondary | ICD-10-CM

## 2020-12-27 DIAGNOSIS — M545 Low back pain, unspecified: Secondary | ICD-10-CM
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
## 2020-12-28 ENCOUNTER — Ambulatory Visit (INDEPENDENT_AMBULATORY_CARE_PROVIDER_SITE_OTHER): Payer: Medicaid Other

## 2020-12-28 ENCOUNTER — Telehealth: Payer: Self-pay | Admitting: Dermatology

## 2020-12-28 DIAGNOSIS — L209 Atopic dermatitis, unspecified: Secondary | ICD-10-CM

## 2020-12-28 MED ORDER — DUPILUMAB 300 MG/2ML ~~LOC~~ SOAJ: 300.0000 mg | Freq: Once | SUBCUTANEOUS | Status: DC

## 2020-12-28 MED ORDER — DUPILUMAB 300 MG/2ML ~~LOC~~ SOAJ
300.0000 mg | Freq: Once | SUBCUTANEOUS | Status: AC
  Administered 2020-12-28: 300 mg via SUBCUTANEOUS

## 2020-12-28 MED ORDER — DUPILUMAB 300 MG/2ML ~~LOC~~ SOSY
300.0000 mg | PREFILLED_SYRINGE | Freq: Once | SUBCUTANEOUS | Status: DC
Start: 2020-12-28 — End: 2020-12-28

## 2020-12-28 MED ORDER — OPZELURA 1.5 % EX CREA: 1.0000 "application " | TOPICAL_CREAM | Freq: Two times a day (BID) | CUTANEOUS | 2 refills | Status: AC

## 2020-12-28 NOTE — Progress Notes (Signed)
Patient given Dupixent  300 mg / 2 ml injection in right arm. He tolerated shot well and had no reverse reactions.

## 2020-12-28 NOTE — Telephone Encounter (Signed)
If we have one, please give patient a sample of Opzelura to use twice a day to stubborn areas, avoiding the eyes. Thank you!

## 2020-12-28 NOTE — Addendum Note (Signed)
Addended by: Sandi Mealy on: 12/28/2020 04:37 PM   Modules accepted: Orders

## 2020-12-29 ENCOUNTER — Ambulatory Visit: Payer: Medicaid Other

## 2020-12-29 ENCOUNTER — Other Ambulatory Visit: Payer: Self-pay

## 2020-12-29 DIAGNOSIS — G8929 Other chronic pain: Secondary | ICD-10-CM

## 2020-12-29 DIAGNOSIS — M6281 Muscle weakness (generalized): Secondary | ICD-10-CM

## 2020-12-29 DIAGNOSIS — M545 Low back pain, unspecified: Secondary | ICD-10-CM | POA: Diagnosis not present

## 2020-12-29 NOTE — Telephone Encounter (Signed)
We do not have any samples. I completed a prior authorization from the insurance today to try and get it covered.
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
## 2021-01-03 NOTE — Telephone Encounter (Signed)
Thank you :)

## 2021-01-04 ENCOUNTER — Encounter: Payer: Self-pay | Admitting: Dermatology

## 2021-01-04 ENCOUNTER — Encounter: Payer: Self-pay | Admitting: Family Medicine

## 2021-01-04 ENCOUNTER — Telehealth: Payer: Self-pay | Admitting: Dermatology

## 2021-01-04 NOTE — Telephone Encounter (Signed)
Medication is not approved. This is not even on Medicaid's formulary as a non-preferred option.

## 2021-01-04 NOTE — Telephone Encounter (Signed)
Please call patient and let him know I recommend we do short-term topical steroids to calm things down. If this does not clear things up, we may need to consider another systemic medicine (Methotrexate). I also recommend using ONLY vanicream skin care line (cleanser and moisturizer) and free and clear laundry detergent. I also recommend the patch testing at [facility] if he is able to get a ride.   If he is in agreement, start hydrocortisone 2.5% cream twice a day as needed to affected areas on the face up to 2 weeks and halobetasol 0.05% ointment twice a day as needed to affected areas on the body up to 2 weeks.   Thank you!

## 2021-01-04 NOTE — Telephone Encounter (Signed)
Ok thank you. I will let him know.

## 2021-01-06 ENCOUNTER — Telehealth (INDEPENDENT_AMBULATORY_CARE_PROVIDER_SITE_OTHER): Payer: Medicaid Other | Admitting: Family Medicine

## 2021-01-06 ENCOUNTER — Encounter: Payer: Self-pay | Admitting: Family Medicine

## 2021-01-06 VITALS — Temp 98.8°F

## 2021-01-06 DIAGNOSIS — Z20822 Contact with and (suspected) exposure to covid-19: Secondary | ICD-10-CM | POA: Diagnosis not present

## 2021-01-06 MED ORDER — PREDNISONE 50 MG PO TABS: 50.0000 mg | ORAL_TABLET | Freq: Every day | ORAL | 0 refills | Status: DC

## 2021-01-06 NOTE — Addendum Note (Signed)
Addended by: Georgina Peer on: 01/06/2021 03:50 PM   Modules accepted: Orders

## 2021-01-06 NOTE — Progress Notes (Signed)
Temp 98.8 F (37.1 C)    Subjective:    Patient ID: Jesus Guerrero, male    DOB: 01-14-1971, 50 y.o.   MRN: [id number]  HPI: Jesus Guerrero is a 49 y.o. male  Chief Complaint  Patient presents with  . Cough    Pt states he has been coughing for 2 days and has congestion. Patient describes sinus pressure and sinus headache.    UPPER RESPIRATORY TRACT INFECTION Duration: 2 days Worst symptom: cough and cogestion Fever: no Cough: no Shortness of breath: no Wheezing: no Chest pain: no Chest tightness: no Chest congestion: no Nasal congestion: yes Runny nose: yes Post nasal drip: yes Sneezing: no Sore throat: no Swollen glands: no Sinus pressure: yes Headache: yes Face pain: yes Toothache: no Ear pain: no  Ear pressure: yes  Eyes red/itching:no Eye drainage/crusting: no  Vomiting: no Rash: no Fatigue: yes Sick contacts: yes Strep contacts: no  Context: worse Recurrent sinusitis: no Relief with OTC cold/cough medications: no  Treatments attempted: cold/sinus   Relevant past medical, surgical, family and social history reviewed and updated as indicated. Interim medical history since our last visit reviewed. Allergies and medications reviewed and updated.  Review of Systems  Constitutional: Positive for fatigue. Negative for activity change, appetite change, chills, diaphoresis, fever and unexpected weight change.  HENT: Positive for congestion, postnasal drip, rhinorrhea, sinus pressure and sinus pain. Negative for dental problem, drooling, ear discharge, ear pain, facial swelling, hearing loss, mouth sores, nosebleeds, sneezing, sore throat, tinnitus, trouble swallowing and voice change.   Respiratory: Negative.   Cardiovascular: Negative.   Gastrointestinal: Negative.   Musculoskeletal: Negative.   Neurological: Positive for headaches. Negative for dizziness, tremors, seizures, syncope, facial asymmetry, speech difficulty, weakness, light-headedness and  numbness.  Psychiatric/Behavioral: Negative.     Per HPI unless specifically indicated above     Objective:    Temp 98.8 F (37.1 C)   Wt Readings from Last 3 Encounters:  12/19/20 192 lb (87.1 kg)  11/01/20 191 lb (86.6 kg)  10/27/20 191 lb (86.6 kg)    Physical Exam Vitals and nursing note reviewed.  Constitutional:      General: He is not in acute distress.    Appearance: Normal appearance. He is not ill-appearing, toxic-appearing or diaphoretic.  HENT:     Head: Normocephalic and atraumatic.     Right Ear: External ear normal.     Left Ear: External ear normal.     Nose: Nose normal.     Mouth/Throat:     Mouth: Mucous membranes are moist.     Pharynx: Oropharynx is clear.  Eyes:     General: No scleral icterus.       Right eye: No discharge.        Left eye: No discharge.     Conjunctiva/sclera: Conjunctivae normal.     Pupils: Pupils are equal, round, and reactive to light.  Pulmonary:     Effort: Pulmonary effort is normal. No respiratory distress.     Comments: Speaking in full sentences Musculoskeletal:        General: Normal range of motion.     Cervical back: Normal range of motion.  Skin:    Coloration: Skin is not jaundiced or pale.     Findings: No bruising, erythema, lesion or rash.  Neurological:     Mental Status: He is alert and oriented to person, place, and time. Mental status is at baseline.  Psychiatric:  Mood and Affect: Mood normal.        Behavior: Behavior normal.        Thought Content: Thought content normal.        Judgment: Judgment normal.     Results for orders placed or performed in visit on 11/09/20  Microscopic Examination   Urine  Result Value Ref Range   WBC, UA 0-5 0 - 5 /hpf   RBC 3-10 (A) 0 - 2 /hpf   Epithelial Cells (non renal) 0-10 0 - 10 /hpf   Casts Present (A) None seen /lpf   Cast Type Hyaline casts N/A   Bacteria, UA None seen None seen/Few  Urinalysis, Complete  Result Value Ref Range   Specific  Gravity, UA 1.025 1.005 - 1.030   pH, UA 6.5 5.0 - 7.5   Color, UA Yellow Yellow   Appearance Ur Clear Clear   Leukocytes,UA Negative Negative   Protein,UA Negative Negative/Trace   Glucose, UA Negative Negative   Ketones, UA Negative Negative   RBC, UA Trace (A) Negative   Bilirubin, UA Negative Negative   Urobilinogen, Ur 0.2 0.2 - 1.0 mg/dL   Nitrite, UA Negative Negative   Microscopic Examination See below:       Assessment & Plan:   Problem List Items Addressed This Visit   None   Visit Diagnoses    Suspected COVID-19 virus infection    -  Primary   Has been swabbed. Self-quarantine until results are back. Prednisone for congestion. Continue symptomatic care. Call with any concerns. Continue to monitor.        Follow up plan: Return if symptoms worsen or fail to improve.    . This visit was completed via MyChart due to the restrictions of the COVID-19 pandemic. All issues as above were discussed and addressed. Physical exam was done as above through visual confirmation on MyChart. If it was felt that the patient should be evaluated in the office, they were directed there. The patient verbally consented to this visit. . Location of the patient: home . Location of the provider: work . Those involved with this call:  . Provider: Park Liter, DO . CMA: Louanna Raw, Guy . Front Desk/Registration: Jill Side  . Time spent on call: 15 minutes with patient face to face via video conference. More than 50% of this time was spent in counseling and coordination of care. 23 minutes total spent in review of patient's record and preparation of their chart.

## 2021-01-09 ENCOUNTER — Encounter: Payer: Self-pay | Admitting: Family Medicine

## 2021-01-09 ENCOUNTER — Other Ambulatory Visit: Payer: Self-pay | Admitting: Family Medicine

## 2021-01-09 ENCOUNTER — Other Ambulatory Visit: Payer: Self-pay

## 2021-01-09 MED ORDER — CETIRIZINE HCL 10 MG PO TABS: 10.0000 mg | ORAL_TABLET | Freq: Every day | ORAL | 3 refills | Status: DC

## 2021-01-09 NOTE — Telephone Encounter (Signed)
Patient is calling today for a refill of his Cetirizine 10 mg tab QD  LOV 01/06/21  Up coming appt: 03/21/20

## 2021-01-10 ENCOUNTER — Encounter: Payer: Self-pay | Admitting: Family Medicine

## 2021-01-10 ENCOUNTER — Ambulatory Visit: Payer: Medicaid Other

## 2021-01-10 ENCOUNTER — Encounter: Payer: Self-pay | Admitting: Dermatology

## 2021-01-10 LAB — NOVEL CORONAVIRUS, NAA: SARS-CoV-2, NAA: DETECTED — AB

## 2021-01-10 NOTE — Progress Notes (Signed)
Please make sure he is aware that he tested positive for COVID and will need to quarantine until 10 days after onset of symptoms

## 2021-01-11 ENCOUNTER — Encounter: Payer: Self-pay | Admitting: Dermatology

## 2021-01-11 ENCOUNTER — Encounter: Payer: Self-pay | Admitting: Family Medicine

## 2021-01-11 ENCOUNTER — Telehealth: Payer: Self-pay | Admitting: *Deleted

## 2021-01-11 ENCOUNTER — Ambulatory Visit: Payer: Medicaid Other | Admitting: Dermatology

## 2021-01-11 NOTE — Telephone Encounter (Signed)
Can you please help this patient. Thank you

## 2021-01-11 NOTE — Telephone Encounter (Signed)
Called to discuss with patient about COVID-19 symptoms and the use of one of the available treatments for those with mild to moderate Covid symptoms and at a high risk of hospitalization.  Pt appears to qualify for outpatient treatment due to co-morbid conditions and/or a member of an at-risk group in accordance with the FDA Emergency Use Authorization.      Unable to reach pt -Left VM to return call.  Tarry Kos

## 2021-01-17 NOTE — Telephone Encounter (Signed)
Has anyone called this patient yet? Thank you!

## 2021-01-18 ENCOUNTER — Telehealth: Payer: Self-pay

## 2021-01-18 ENCOUNTER — Encounter: Payer: Self-pay | Admitting: Dermatology

## 2021-01-18 NOTE — Telephone Encounter (Signed)
Left message for patient to return call to office regarding medications for rash, JS

## 2021-01-19 ENCOUNTER — Ambulatory Visit: Payer: Medicaid Other | Admitting: Dermatology

## 2021-01-19 ENCOUNTER — Encounter: Payer: Self-pay | Admitting: Family Medicine

## 2021-01-21 ENCOUNTER — Encounter: Payer: Self-pay | Admitting: Dermatology

## 2021-01-24 ENCOUNTER — Encounter: Payer: Self-pay | Admitting: Dermatology

## 2021-01-24 ENCOUNTER — Encounter: Payer: Self-pay | Admitting: Family Medicine

## 2021-01-29 ENCOUNTER — Encounter: Payer: Self-pay | Admitting: Dermatology

## 2021-01-30 ENCOUNTER — Other Ambulatory Visit: Payer: Self-pay

## 2021-01-30 ENCOUNTER — Encounter: Payer: Self-pay | Admitting: Dermatology

## 2021-01-30 ENCOUNTER — Ambulatory Visit: Payer: Medicaid Other | Attending: Family Medicine

## 2021-01-30 DIAGNOSIS — G8929 Other chronic pain: Secondary | ICD-10-CM | POA: Insufficient documentation

## 2021-01-30 DIAGNOSIS — M545 Low back pain, unspecified: Secondary | ICD-10-CM | POA: Insufficient documentation

## 2021-01-30 DIAGNOSIS — M6281 Muscle weakness (generalized): Secondary | ICD-10-CM | POA: Insufficient documentation
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
## 2021-01-31 ENCOUNTER — Ambulatory Visit: Payer: Medicaid Other | Admitting: Dermatology

## 2021-01-31 ENCOUNTER — Encounter: Payer: Self-pay | Admitting: Dermatology

## 2021-02-01 ENCOUNTER — Ambulatory Visit: Payer: Medicaid Other | Admitting: Dermatology

## 2021-02-08 ENCOUNTER — Other Ambulatory Visit: Payer: Self-pay

## 2021-02-08 ENCOUNTER — Ambulatory Visit (INDEPENDENT_AMBULATORY_CARE_PROVIDER_SITE_OTHER): Payer: Medicaid Other

## 2021-02-08 DIAGNOSIS — L209 Atopic dermatitis, unspecified: Secondary | ICD-10-CM | POA: Diagnosis not present

## 2021-02-08 NOTE — Progress Notes (Signed)
Patient here for Dupixent injection.  Dupixent 300mg /79mL injected into left upper arm. Patient tolerated well.  LOT: [id number] EXP: 08/30/2022

## 2021-02-11 ENCOUNTER — Encounter: Payer: Self-pay | Admitting: Dermatology

## 2021-02-11 ENCOUNTER — Encounter: Payer: Self-pay | Admitting: Family Medicine

## 2021-02-14 ENCOUNTER — Ambulatory Visit: Payer: Medicaid Other | Admitting: Nurse Practitioner

## 2021-02-16 ENCOUNTER — Encounter: Payer: Self-pay | Admitting: Family Medicine

## 2021-02-16 ENCOUNTER — Other Ambulatory Visit: Payer: Self-pay

## 2021-02-16 DIAGNOSIS — L209 Atopic dermatitis, unspecified: Secondary | ICD-10-CM

## 2021-02-16 MED ORDER — CLOBETASOL PROPIONATE 0.05 % EX FOAM: Freq: Two times a day (BID) | CUTANEOUS | 2 refills | Status: DC

## 2021-02-18 ENCOUNTER — Other Ambulatory Visit: Payer: Self-pay | Admitting: Dermatology

## 2021-02-18 DIAGNOSIS — K120 Recurrent oral aphthae: Secondary | ICD-10-CM

## 2021-02-20 ENCOUNTER — Encounter: Payer: Self-pay | Admitting: Family Medicine

## 2021-02-20 ENCOUNTER — Other Ambulatory Visit: Payer: Self-pay | Admitting: Family Medicine

## 2021-02-20 MED ORDER — MONTELUKAST SODIUM 10 MG PO TABS: 10.0000 mg | ORAL_TABLET | Freq: Every day | ORAL | 3 refills | Status: DC

## 2021-02-20 NOTE — Telephone Encounter (Signed)
Looks like he should just need a prescription for the Singulair, he has prescriptions for the others.

## 2021-02-21 ENCOUNTER — Encounter: Payer: Self-pay | Admitting: Family Medicine

## 2021-02-22 ENCOUNTER — Ambulatory Visit: Payer: Medicaid Other | Admitting: Dermatology

## 2021-02-22 ENCOUNTER — Other Ambulatory Visit: Payer: Self-pay

## 2021-02-22 ENCOUNTER — Encounter: Payer: Self-pay | Admitting: Dermatology

## 2021-02-22 DIAGNOSIS — L2389 Allergic contact dermatitis due to other agents: Secondary | ICD-10-CM | POA: Diagnosis not present

## 2021-02-22 DIAGNOSIS — L280 Lichen simplex chronicus: Secondary | ICD-10-CM

## 2021-02-22 DIAGNOSIS — L209 Atopic dermatitis, unspecified: Secondary | ICD-10-CM | POA: Diagnosis not present

## 2021-02-22 MED ORDER — DUPILUMAB 300 MG/2ML ~~LOC~~ SOAJ
300.0000 mg | Freq: Once | SUBCUTANEOUS | Status: AC
  Administered 2021-02-22: 300 mg via SUBCUTANEOUS

## 2021-02-22 MED ORDER — DOXEPIN HCL 5 % EX CREA: 1.0000 "application " | TOPICAL_CREAM | Freq: Two times a day (BID) | CUTANEOUS | 1 refills | Status: DC

## 2021-02-22 MED ORDER — TACROLIMUS 0.1 % EX OINT: TOPICAL_OINTMENT | CUTANEOUS | 1 refills | Status: DC

## 2021-02-22 NOTE — Patient Instructions (Addendum)
Topical steroids (such as triamcinolone, fluocinolone, fluocinonide, mometasone, clobetasol, halobetasol, betamethasone, hydrocortisone) can cause thinning and lightening of the skin if they are used for too long in the same area. Your physician has selected the right strength medicine for your problem and area affected on the body. Please use your medication only as directed by your physician to prevent side effects.  Avoid applying to face, groin, and axilla. Use as directed. Risk of skin atrophy with long-term use reviewed.   Side effects of Otezla (apremilast) include diarrhea, nausea, headache, upper respiratory infection, depression, and weight decrease (5-10%). It should only be taken by pregnant women after a discussion regarding risks and benefits with their doctor. Goal is control of skin condition, not cure.  The use of [place]ail requires long term medication management, including periodic office visits.

## 2021-02-22 NOTE — Progress Notes (Signed)
   Follow-Up Visit   Subjective  Jesus Guerrero is a 50 y.o. male who presents for the following: 2 week follow up (Patient is here for 2 week follow up for scalp, forehead and hands. Patient states clobetasol ointment helps. ).  Hands are worse. Scalp and face are still itchy.   Patient here also for 2 week dupixent shot.   The following portions of the chart were reviewed this encounter and updated as appropriate:  Tobacco  Allergies  Meds  Problems  Med Hx  Surg Hx  Fam Hx       Objective  Well appearing patient in no apparent distress; mood and affect are within normal limits.  A focused examination was performed including scalp, bilateral hand and wrist, bilateral legs. Relevant physical exam findings are noted in the Assessment and Plan.  Objective  bilateral hands & wrists, forehead: Scaly pink plaques with fissures at dorsal and palmar hands  Objective  Scalp: Scalp with scars, normal appearing skin, broken hairs  Assessment & Plan  Allergic contact dermatitis due to other agents  Atopic dermatitis, unspecified type bilateral hands & wrists, forehead  Chronic condition with duration over one year. Condition is bothersome to patient. Currently significantly flared at hands.  Again recommended more comprehensive patch testing at [facility] if patient is able to get a ride.  Apply Aquafor or vaseline apply to hands and wrist fissures several times per day.  On dupixent with improvement but inadequate response. Has failed eucrisa, tacrolimus, clobetasol.   Thicker plaques at hands raise suspicion for possible psoriasis overlap. Will start [place]. Start [place] 30mg  bid - samples packs given and patient advised to titrate up as directed on package  Lot [id number] Exp 11/29/2022  Side effects of Otezla (apremilast) include diarrhea, nausea, headache, upper respiratory infection, depression, and weight decrease (5-10%). It should only be taken by pregnant women after a  discussion regarding risks and benefits with their doctor. Goal is control of skin condition, not cure.  The use of [place] requires long term medication management, including periodic office visits.  Dupilumab (Dupixent) is a treatment given by injection for adults with moderate-to-severe atopic dermatitis. Goal is control of skin condition, not cure. It is given as 2 injections at the first dose followed by 1 injection ever 2 weeks thereafter.  Potential side effects include allergic reaction, herpes infections, injection site reactions and conjunctivitis (inflammation of the eyes).  The use of Dupixent requires long term medication management, including periodic office visits.  Dupixent injected today      Reordered Medications tacrolimus (PROTOPIC) 0.1 % ointment  Other Related Medications dupilumab (DUPIXENT) prefilled syringe 300 mg mupirocin ointment (BACTROBAN) 2 % clobetasol (OLUX) 0.05 % topical foam  Neurodermatitis Scalp  Start doxepin 5 % cream apply 1 application twice a day to affected area  If not approved or does not work, may consider topical capsaicin 5x per day for 6 weeks or skin medicinals combination anti-itch topical or po gabapentin  Doxepin HCl 5 % CREA - Scalp  Return in about 4 weeks (around 03/22/2021) for follow up on atopic dermatitis .  I, [name], CMA, am acting as scribe for [name], MD.  Documentation: I have reviewed the above documentation for accuracy and completeness, and I agree with the above.  [name], MD

## 2021-02-23 ENCOUNTER — Encounter: Payer: Self-pay | Admitting: Dermatology

## 2021-02-23 ENCOUNTER — Other Ambulatory Visit: Payer: Self-pay

## 2021-02-23 ENCOUNTER — Telehealth: Payer: Self-pay

## 2021-02-23 MED ORDER — PROTOPIC 0.1 % EX OINT: TOPICAL_OINTMENT | Freq: Two times a day (BID) | CUTANEOUS | 1 refills | Status: DC

## 2021-02-23 NOTE — Telephone Encounter (Signed)
Sent information to patient through [place].

## 2021-02-23 NOTE — Telephone Encounter (Signed)
In that case, recommend OTC topical capsaicin cream 4-5 times per day to the itchy area at scalp for the next 6 weeks to try to "reset" the nerves in the area. Thank you

## 2021-02-23 NOTE — Telephone Encounter (Signed)
Doxepin Cream is non formulary for patient through Medicaid.

## 2021-02-24 ENCOUNTER — Encounter: Payer: Self-pay | Admitting: Dermatology

## 2021-02-24 ENCOUNTER — Ambulatory Visit (INDEPENDENT_AMBULATORY_CARE_PROVIDER_SITE_OTHER): Payer: Medicaid Other | Admitting: Family Medicine

## 2021-02-24 ENCOUNTER — Encounter: Payer: Self-pay | Admitting: Family Medicine

## 2021-02-24 ENCOUNTER — Other Ambulatory Visit: Payer: Self-pay

## 2021-02-24 VITALS — BP 128/73 | HR 93 | Temp 98.7°F | Wt 193.4 lb

## 2021-02-24 DIAGNOSIS — M79675 Pain in left toe(s): Secondary | ICD-10-CM | POA: Diagnosis not present

## 2021-02-24 MED ORDER — DICLOFENAC SODIUM 1 % EX GEL: 4.0000 g | Freq: Four times a day (QID) | CUTANEOUS | 3 refills | Status: DC

## 2021-02-24 NOTE — Progress Notes (Signed)
BP 128/73   Pulse 93   Temp 98.7 F (37.1 C)   Wt 193 lb 6.4 oz (87.7 kg)   BMI 33.20 kg/m    Subjective:    Patient ID: Jesus Guerrero, male    DOB: [birth year], 50 y.o.   MRN: [id number]  HPI: SHAHRUKH PASCH is a 50 y.o. male  Chief Complaint  Patient presents with  . Toe Pain    Patient states about 2 weeks ago big toe on left foot began to hurt and is swollen    TOE PAIN Duration: 2 weeks Involved toe: leftbig toe  Mechanism of injury: unknown Onset: gradual Severity: severe  Quality: aching and sore Frequency: constant Radiation: no Aggravating factors: weight bearing and walking  Alleviating factors: nothing  Status: stable Treatments attempted: nothing  Relief with NSAIDs?: No NSAIDs Taken Morning stiffness: no Redness: no  Bruising: no Swelling: no Paresthesias / decreased sensation: no Fevers: no  Relevant past medical, surgical, family and social history reviewed and updated as indicated. Interim medical history since our last visit reviewed. Allergies and medications reviewed and updated.  Review of Systems  Constitutional: Negative.   Respiratory: Negative.   Cardiovascular: Negative.   Gastrointestinal: Negative.   Musculoskeletal: Positive for arthralgias. Negative for back pain, gait problem, joint swelling, myalgias, neck pain and neck stiffness.  Psychiatric/Behavioral: Negative.     Per HPI unless specifically indicated above     Objective:    BP 128/73   Pulse 93   Temp 98.7 F (37.1 C)   Wt 193 lb 6.4 oz (87.7 kg)   BMI 33.20 kg/m   Wt Readings from Last 3 Encounters:  02/24/21 193 lb 6.4 oz (87.7 kg)  12/19/20 192 lb (87.1 kg)  11/01/20 191 lb (86.6 kg)    Physical Exam Vitals and nursing note reviewed.  Constitutional:      General: He is not in acute distress.    Appearance: Normal appearance. He is not ill-appearing, toxic-appearing or diaphoretic.  HENT:     Head: Normocephalic and atraumatic.     Right Ear:  External ear normal.     Left Ear: External ear normal.     Nose: Nose normal.     Mouth/Throat:     Mouth: Mucous membranes are moist.     Pharynx: Oropharynx is clear.  Eyes:     General: No scleral icterus.       Right eye: No discharge.        Left eye: No discharge.     Extraocular Movements: Extraocular movements intact.     Conjunctiva/sclera: Conjunctivae normal.     Pupils: Pupils are equal, round, and reactive to light.  Cardiovascular:     Rate and Rhythm: Normal rate and regular rhythm.     Pulses: Normal pulses.     Heart sounds: Normal heart sounds. No murmur heard. No friction rub. No gallop.   Pulmonary:     Effort: Pulmonary effort is normal. No respiratory distress.     Breath sounds: Normal breath sounds. No stridor. No wheezing, rhonchi or rales.  Chest:     Chest wall: No tenderness.  Musculoskeletal:        General: Normal range of motion.     Cervical back: Normal range of motion and neck supple.  Skin:    General: Skin is warm and dry.     Capillary Refill: Capillary refill takes less than 2 seconds.     Coloration: Skin is not jaundiced  or pale.     Findings: No bruising, erythema, lesion or rash.  Neurological:     General: No focal deficit present.     Mental Status: He is alert and oriented to person, place, and time. Mental status is at baseline.  Psychiatric:        Mood and Affect: Mood normal.        Behavior: Behavior normal.        Thought Content: Thought content normal.        Judgment: Judgment normal.     Results for orders placed or performed in visit on 01/06/21  Novel Coronavirus, NAA ([facility])   Specimen: Nasopharyngeal(NP) swabs in vial transport medium  Result Value Ref Range   SARS-CoV-2, NAA Detected (A) Not Detected      Assessment & Plan:   Problem List Items Addressed This Visit   None   Visit Diagnoses    Great toe pain, left    -  Primary   No sign of gout. Will start voltaren. If not better by Wednesday- will  call his podiatry.        Follow up plan: Return After 3/30 for physical.

## 2021-02-25 ENCOUNTER — Encounter: Payer: Self-pay | Admitting: Family Medicine

## 2021-02-28 ENCOUNTER — Encounter: Payer: Self-pay | Admitting: Dermatology

## 2021-03-03 ENCOUNTER — Encounter: Payer: Self-pay | Admitting: Family Medicine

## 2021-03-05 ENCOUNTER — Encounter: Payer: Self-pay | Admitting: Family Medicine

## 2021-03-05 ENCOUNTER — Encounter: Payer: Self-pay | Admitting: Dermatology

## 2021-03-06 ENCOUNTER — Encounter: Payer: Self-pay | Admitting: Family Medicine

## 2021-03-06 ENCOUNTER — Other Ambulatory Visit: Payer: Self-pay | Admitting: Family Medicine

## 2021-03-06 MED ORDER — ATORVASTATIN CALCIUM 20 MG PO TABS: 20.0000 mg | ORAL_TABLET | Freq: Every day | ORAL | 0 refills | Status: DC

## 2021-03-06 NOTE — Telephone Encounter (Signed)
Requested medication (s) are due for refill today: yes  Requested medication (s) are on the active medication list: yes  Last refill:  12/12/2020  Future visit scheduled: no  Notes to clinic: over due for labs    Requested Prescriptions  Pending Prescriptions Disp Refills   atorvastatin (LIPITOR) 20 MG tablet [Pharmacy Med Name: Atorvastatin Calcium 20 MG Oral Tablet] 90 tablet 0    Sig: Take 1 tablet by mouth once daily      Cardiovascular:  Antilipid - Statins Failed - 03/06/2021  2:22 PM      Failed - LDL in normal range and within 360 days    LDL Chol Calc (NIH)  Date Value Ref Range Status  06/30/2020 83 0 - 99 mg/dL Final          Failed - Triglycerides in normal range and within 360 days    Triglycerides  Date Value Ref Range Status  06/30/2020 185 (H) 0 - 149 mg/dL Final          Passed - Total Cholesterol in normal range and within 360 days    Cholesterol, Total  Date Value Ref Range Status  06/30/2020 156 100 - 199 mg/dL Final          Passed - HDL in normal range and within 360 days    HDL  Date Value Ref Range Status  06/30/2020 42 >39 mg/dL Final          Passed - Patient is not pregnant      Passed - Valid encounter within last 12 months    Recent Outpatient Visits           1 week ago Great toe pain, left   [facility] [employer], [place], DO   1 month ago Suspected COVID-19 virus infection   [employer] [facility], [place], DO   2 months ago Acute left-sided low back pain without sciatica   [employer], Megan P, DO   4 months ago Acute left-sided low back pain without sciatica   [employer], Megan P, DO   4 months ago Acute left-sided low back pain without sciatica   [place], Barb Merino, DO       Future Appointments             In 3 weeks Wynetta Emery, Barb Merino, DO [employer], [place]   In 4 weeks [place], [place], [place]   In 8  months [place], [place]

## 2021-03-06 NOTE — Telephone Encounter (Signed)
Jesus Guerrero has called and states that he needs a refill for his cholesterol meds.

## 2021-03-08 ENCOUNTER — Encounter: Payer: Self-pay | Admitting: Dermatology

## 2021-03-08 ENCOUNTER — Ambulatory Visit: Payer: Medicaid Other

## 2021-03-08 ENCOUNTER — Other Ambulatory Visit: Payer: Self-pay

## 2021-03-08 ENCOUNTER — Ambulatory Visit (INDEPENDENT_AMBULATORY_CARE_PROVIDER_SITE_OTHER): Payer: Medicaid Other | Admitting: Dermatology

## 2021-03-08 DIAGNOSIS — K130 Diseases of lips: Secondary | ICD-10-CM

## 2021-03-08 DIAGNOSIS — L209 Atopic dermatitis, unspecified: Secondary | ICD-10-CM

## 2021-03-08 DIAGNOSIS — L2089 Other atopic dermatitis: Secondary | ICD-10-CM | POA: Diagnosis not present

## 2021-03-08 MED ORDER — DUPILUMAB 300 MG/2ML ~~LOC~~ SOSY
300.0000 mg | PREFILLED_SYRINGE | Freq: Once | SUBCUTANEOUS | Status: AC
  Administered 2021-03-08: 300 mg via SUBCUTANEOUS

## 2021-03-08 MED ORDER — CLOBETASOL PROPIONATE 0.05 % EX OINT: 1.0000 "application " | TOPICAL_OINTMENT | Freq: Two times a day (BID) | CUTANEOUS | 0 refills | Status: DC

## 2021-03-08 NOTE — Patient Instructions (Signed)
Start clobetasol ointment twice a day up to 2 weeks to left hip rash  Continue tacrolimus to stubborn areas  Start [place] as directed, ok to crush slightly and eat in applesauce

## 2021-03-08 NOTE — Progress Notes (Signed)
   Follow-Up Visit   Subjective  Jesus Guerrero is a 50 y.o. male who presents for the following: Eczema (Patient here today for rash and dupixent shot. ).  Notes new rash at left hip, very itchy.   Still with stubborn areas at hands despite dupixent and tacrolimus.  Has not started [place].  The following portions of the chart were reviewed this encounter and updated as appropriate:  Tobacco  Allergies  Meds  Problems  Med Hx  Surg Hx  Fam Hx        Objective  Well appearing patient in no apparent distress; mood and affect are within normal limits.  A focused examination was performed including face, hands, left hip. Relevant physical exam findings are noted in the Assessment and Plan.  Objective  Left Thigh - Anterior: Scaly pink nummular plaque left hip  Objective  Lips: Lower lip with scaly pink plaque  Assessment & Plan  Atopic dermatitis, unspecified type  Other Related Medications dupilumab (DUPIXENT) prefilled syringe 300 mg mupirocin ointment (BACTROBAN) 2 % clobetasol (OLUX) 0.05 % topical foam  Other atopic dermatitis Left Thigh - Anterior  Chronic condition with duration over one year. Condition is bothersome to patient. Currently flared.  Start clobetasol ointment twice a day up to 2 weeks to left hip rash  Continue tacrolimus to stubborn areas at face and hands  Start Otezla as directed, ok to crush slightly and eat in applesauce  Continue Dupixent q2 weeks. Dupixent injected today.      Cheilitis Lips  Use Vaseline as needed    Return in about 2 weeks (around 03/22/2021) for dupixent nurse visit, 4 week follow up atopic derm.  I, Jesus Guerrero, CMA, am acting as scribe for Jesus Gleason, MD.  Documentation: I have reviewed the above documentation for accuracy and completeness, and I agree with the above.  Jesus Gleason, MD

## 2021-03-14 ENCOUNTER — Encounter: Payer: Self-pay | Admitting: Family Medicine

## 2021-03-14 ENCOUNTER — Other Ambulatory Visit: Payer: Self-pay

## 2021-03-14 ENCOUNTER — Other Ambulatory Visit: Payer: Self-pay | Admitting: Family Medicine

## 2021-03-14 NOTE — Telephone Encounter (Signed)
Patient is requesting a refill for his Naproxen 500 mg  LOV  02/24/21  Up coming visit 03/31/21

## 2021-03-14 NOTE — Telephone Encounter (Signed)
Refill request send to Dr. Neomia Dear

## 2021-03-15 ENCOUNTER — Encounter: Payer: Self-pay | Admitting: Dermatology

## 2021-03-15 MED ORDER — NAPROXEN 500 MG PO TABS
ORAL_TABLET | ORAL | 3 refills | Status: DC
Start: 2021-03-15 — End: 2021-03-31

## 2021-03-16 ENCOUNTER — Encounter: Payer: Self-pay | Admitting: Family Medicine

## 2021-03-18 ENCOUNTER — Encounter: Payer: Self-pay | Admitting: Dermatology

## 2021-03-20 ENCOUNTER — Other Ambulatory Visit: Payer: Self-pay | Admitting: Family Medicine

## 2021-03-20 ENCOUNTER — Encounter: Payer: Self-pay | Admitting: Family Medicine

## 2021-03-20 NOTE — Telephone Encounter (Signed)
   Notes to clinic:   medication was filled on 1 /09/2021 Review for continued use and refill   Requested Prescriptions  Pending Prescriptions Disp Refills   hydrOXYzine (ATARAX/VISTARIL) 10 MG tablet [Pharmacy Med Name: hydrOXYzine HCl 10 MG Oral Tablet] 60 tablet 0    Sig: TAKE 1-2 TABLETS BY MOUTH AT BEDTIME AS NEEDED FOR ITCHING.      Ear, Nose, and Throat:  Antihistamines Passed - 03/20/2021  1:12 PM      Passed - Valid encounter within last 12 months    Recent Outpatient Visits           3 weeks ago Great toe pain, left   [facility] [place], [place], DO   2 months ago Suspected COVID-19 virus infection   [employer] [facility], [place], DO   3 months ago Acute left-sided low back pain without sciatica   [employer], Megan P, DO   4 months ago Acute left-sided low back pain without sciatica   [place], Megan P, DO   5 months ago Acute left-sided low back pain without sciatica   [place], Barb Merino, DO       Future Appointments             In 1 week Wynetta Emery, Barb Merino, DO [employer], [place]   In 2 weeks Laurence Ferrari, [place], MD [place]   In 7 months [place], [place]

## 2021-03-20 NOTE — Telephone Encounter (Signed)
Pt scheduled for cpe 4/1

## 2021-03-20 NOTE — Telephone Encounter (Signed)
Patient needs an appointment prior to refill

## 2021-03-21 ENCOUNTER — Ambulatory Visit: Payer: Medicaid Other | Admitting: Family Medicine

## 2021-03-22 ENCOUNTER — Encounter: Payer: Self-pay | Admitting: Dermatology

## 2021-03-22 ENCOUNTER — Encounter: Payer: Self-pay | Admitting: Family Medicine

## 2021-03-22 ENCOUNTER — Other Ambulatory Visit: Payer: Self-pay

## 2021-03-22 ENCOUNTER — Other Ambulatory Visit: Payer: Self-pay | Admitting: Dermatology

## 2021-03-22 ENCOUNTER — Ambulatory Visit: Payer: Medicaid Other

## 2021-03-22 DIAGNOSIS — L209 Atopic dermatitis, unspecified: Secondary | ICD-10-CM

## 2021-03-22 MED ORDER — DUPILUMAB 300 MG/2ML ~~LOC~~ SOAJ
300.0000 mg | Freq: Once | SUBCUTANEOUS | Status: AC
  Administered 2021-03-22: 300 mg via SUBCUTANEOUS

## 2021-03-22 MED ORDER — MUPIROCIN 2 % EX OINT: TOPICAL_OINTMENT | CUTANEOUS | 0 refills | Status: DC

## 2021-03-22 NOTE — Progress Notes (Signed)
Patient here for two week Dupixent injection.   Dupixent 300mg /50mL injected SQ into left arm. Patient tolerated well.   LOT: [id number] EXP: 08/30/2022

## 2021-03-27 ENCOUNTER — Encounter: Payer: Self-pay | Admitting: Family Medicine

## 2021-03-27 ENCOUNTER — Encounter: Payer: Self-pay | Admitting: Dermatology

## 2021-03-29 ENCOUNTER — Encounter: Payer: Self-pay | Admitting: Family Medicine

## 2021-03-31 ENCOUNTER — Other Ambulatory Visit: Payer: Self-pay

## 2021-03-31 ENCOUNTER — Ambulatory Visit (INDEPENDENT_AMBULATORY_CARE_PROVIDER_SITE_OTHER): Payer: Medicaid Other | Admitting: Family Medicine

## 2021-03-31 ENCOUNTER — Encounter: Payer: Self-pay | Admitting: Family Medicine

## 2021-03-31 VITALS — BP 136/72 | HR 91 | Temp 98.0°F | Ht 65.5 in | Wt 195.4 lb

## 2021-03-31 DIAGNOSIS — Z0000 Encounter for general adult medical examination without abnormal findings: Secondary | ICD-10-CM

## 2021-03-31 LAB — URINALYSIS, ROUTINE W REFLEX MICROSCOPIC
Bilirubin, UA: NEGATIVE
Glucose, UA: NEGATIVE
Ketones, UA: NEGATIVE
Leukocytes,UA: NEGATIVE
Nitrite, UA: NEGATIVE
Protein,UA: NEGATIVE
Specific Gravity, UA: 1.015 (ref 1.005–1.030)
Urobilinogen, Ur: 0.2 mg/dL (ref 0.2–1.0)
pH, UA: 6 (ref 5.0–7.5)

## 2021-03-31 LAB — MICROSCOPIC EXAMINATION
Bacteria, UA: NONE SEEN
Epithelial Cells (non renal): NONE SEEN /hpf (ref 0–10)
RBC, Urine: NONE SEEN /hpf (ref 0–2)
WBC, UA: NONE SEEN /hpf (ref 0–5)

## 2021-03-31 LAB — MICROALBUMIN, URINE WAIVED
Creatinine, Urine Waived: 200 mg/dL (ref 10–300)
Microalb, Ur Waived: 10 mg/L (ref 0–19)
Microalb/Creat Ratio: <30 mg/g (ref <30)

## 2021-03-31 MED ORDER — NAPROXEN 500 MG PO TABS: ORAL_TABLET | ORAL | 3 refills | Status: DC

## 2021-03-31 MED ORDER — FLUTICASONE PROPIONATE 50 MCG/ACT NA SUSP: 2.0000 | Freq: Every day | NASAL | 12 refills | Status: DC

## 2021-03-31 MED ORDER — ALLOPURINOL 300 MG PO TABS: 300.0000 mg | ORAL_TABLET | Freq: Every day | ORAL | 1 refills | Status: DC

## 2021-03-31 MED ORDER — ATORVASTATIN CALCIUM 20 MG PO TABS: 20.0000 mg | ORAL_TABLET | Freq: Every day | ORAL | 1 refills | Status: DC

## 2021-03-31 MED ORDER — HYDROXYZINE HCL 10 MG PO TABS: ORAL_TABLET | ORAL | 1 refills | Status: DC
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
## 2021-03-31 NOTE — Patient Instructions (Signed)

## 2021-04-01 ENCOUNTER — Encounter: Payer: Self-pay | Admitting: Family Medicine

## 2021-04-01 ENCOUNTER — Other Ambulatory Visit: Payer: Self-pay | Admitting: Dermatology

## 2021-04-01 LAB — CBC WITH DIFFERENTIAL/PLATELET
Basophils Absolute: 0.1 10*3/uL (ref 0.0–0.2)
Basos: 1 %
EOS (ABSOLUTE): 0.1 10*3/uL (ref 0.0–0.4)
Eos: 1 %
Hematocrit: 43.8 % (ref 37.5–51.0)
Hemoglobin: 15 g/dL (ref 13.0–17.7)
Immature Grans (Abs): 0.1 10*3/uL (ref 0.0–0.1)
Immature Granulocytes: 1 %
Lymphocytes Absolute: 2 10*3/uL (ref 0.7–3.1)
Lymphs: 20 %
MCH: 30.8 pg (ref 26.6–33.0)
MCHC: 34.2 g/dL (ref 31.5–35.7)
MCV: 90 fL (ref 79–97)
Monocytes Absolute: 0.6 10*3/uL (ref 0.1–0.9)
Monocytes: 6 %
Neutrophils Absolute: 7.1 10*3/uL — ABNORMAL HIGH (ref 1.4–7.0)
Neutrophils: 71 %
Platelets: 242 10*3/uL (ref 150–450)
RBC: 4.87 x10E6/uL (ref 4.14–5.80)
RDW: 13.4 % (ref 11.6–15.4)
WBC: 9.8 10*3/uL (ref 3.4–10.8)

## 2021-04-01 LAB — COMPREHENSIVE METABOLIC PANEL
ALT: 25 IU/L (ref 0–44)
AST: 22 IU/L (ref 0–40)
Albumin/Globulin Ratio: 1.6 (ref 1.2–2.2)
Albumin: 4.4 g/dL (ref 3.8–4.9)
Alkaline Phosphatase: 93 IU/L (ref 44–121)
BUN/Creatinine Ratio: 17 (ref 9–20)
BUN: 19 mg/dL (ref 6–24)
Bilirubin Total: 0.6 mg/dL (ref 0.0–1.2)
CO2: 21 mmol/L (ref 20–29)
Calcium: 9.1 mg/dL (ref 8.7–10.2)
Chloride: 101 mmol/L (ref 96–106)
Creatinine, Ser: 1.11 mg/dL (ref 0.76–1.27)
Globulin, Total: 2.8 g/dL (ref 1.5–4.5)
Glucose: 96 mg/dL (ref 65–99)
Potassium: 4.4 mmol/L (ref 3.5–5.2)
Sodium: 137 mmol/L (ref 134–144)
Total Protein: 7.2 g/dL (ref 6.0–8.5)
eGFR: 80 mL/min/{1.73_m2} (ref >59)

## 2021-04-01 LAB — LIPID PANEL W/O CHOL/HDL RATIO
Cholesterol, Total: 180 mg/dL (ref 100–199)
HDL: 48 mg/dL (ref >39)
LDL Chol Calc (NIH): 96 mg/dL (ref 0–99)
Triglycerides: 209 mg/dL — ABNORMAL HIGH (ref 0–149)
VLDL Cholesterol Cal: 36 mg/dL (ref 5–40)

## 2021-04-01 LAB — TSH: TSH: 3.47 u[IU]/mL (ref 0.450–4.500)

## 2021-04-01 LAB — PSA: Prostate Specific Ag, Serum: 0.6 ng/mL (ref 0.0–4.0)

## 2021-04-01 LAB — URIC ACID: Uric Acid: 4.5 mg/dL (ref 3.8–8.4)

## 2021-04-04 ENCOUNTER — Encounter: Payer: Self-pay | Admitting: Dermatology

## 2021-04-04 ENCOUNTER — Other Ambulatory Visit: Payer: Self-pay

## 2021-04-04 ENCOUNTER — Ambulatory Visit: Payer: Medicaid Other | Admitting: Dermatology

## 2021-04-04 DIAGNOSIS — L2389 Allergic contact dermatitis due to other agents: Secondary | ICD-10-CM

## 2021-04-04 DIAGNOSIS — L280 Lichen simplex chronicus: Secondary | ICD-10-CM | POA: Diagnosis not present

## 2021-04-04 DIAGNOSIS — L309 Dermatitis, unspecified: Secondary | ICD-10-CM | POA: Diagnosis not present

## 2021-04-04 MED ORDER — DUPILUMAB 300 MG/2ML ~~LOC~~ SOAJ
300.0000 mg | Freq: Once | SUBCUTANEOUS | Status: AC
  Administered 2021-04-04: 300 mg via SUBCUTANEOUS

## 2021-04-04 NOTE — Progress Notes (Signed)
   Follow-Up Visit   Subjective  Jesus Guerrero is a 50 y.o. male who presents for the following: Follow-up (Patient here today for 2 week Dupixent injection and dermatitis follow up. Patient advises the area at left hip is still itching. He did not start [place] and is using clobetasol ointment to left hip and tacrolimus to hands and face. ).    The following portions of the chart were reviewed this encounter and updated as appropriate:   Tobacco  Allergies  Meds  Problems  Med Hx  Surg Hx  Fam Hx      Review of Systems:  No other skin or systemic complaints except as noted in HPI or Assessment and Plan.  Objective  Well appearing patient in no apparent distress; mood and affect are within normal limits.  A focused examination was performed including hands, arms. Relevant physical exam findings are noted in the Assessment and Plan.  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 Condition is bothersome to patient. Currently flared.  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  Continue Dupixent 300mg /33mL SQ Q2weeks.  Dupixent 300mg /51mL injected SQ to right upper arm. Patient tolerated well.   Start [place]. Pt has not yet started. Pt advised ok to crush tablet a bit so he is able to swallow it.  If not improving on [place], may consider applying for [place]. If not available, may consider methotrexate.   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 [facility]. Patient will see if he can get a ride.  Return in about 4 weeks (around 05/02/2021).  [name],  RMA, am acting as scribe for [name], MD .  Documentation: I have reviewed the above documentation for accuracy and completeness, and I agree with the above.  [name], MD

## 2021-04-04 NOTE — Patient Instructions (Addendum)
Recommend OTC Gold Bond Rapid Relief Anti-Itch cream (pramoxine + menthol) up to 3 times per day to areas that are itchy - Available at [facility] near [place]. It will be labeled DUAL ACTION.    Start Otezla starter packs    If you have any questions or concerns for your doctor, please call our main line at [phone] and press option 4 to reach your doctor's medical assistant. If no one answers, please leave a voicemail as directed and we will return your call as soon as possible. Messages left after 4 pm will be answered the following business day.   You may also send [place] a message via [place]. We typically respond to MyChart messages within 1-2 business days.  For prescription refills, please ask your pharmacy to contact our office. Our fax number is [phone].  If you have an urgent issue when the clinic is closed that cannot wait until the next business day, you can page your doctor at the number below.    Please note that while we do our best to be available for urgent issues outside of office hours, we are not available 24/7.   If you have an urgent issue and are unable to reach [place], you may choose to seek medical care at your doctor's office, retail clinic, urgent care center, or emergency room.  If you have a medical emergency, please immediately call 911 or go to the emergency department.  Pager Numbers  - Dr. Nehemiah Massed: [phone]  - Dr. Laurence Ferrari: [phone]  - Dr. Nicole Kindred: [phone]  In the event of inclement weather, please call our main line at [phone] for an update on the status of any delays or closures.  Dermatology Medication Tips: Please keep the boxes that topical medications come in in order to help keep track of the instructions about where and how to use these. Pharmacies typically print the medication instructions only on the boxes and not directly on the medication tubes.   If your medication is too expensive, please contact our  office at [phone] option 4 or send [place] a message through [place].   We are unable to tell what your co-pay for medications will be in advance as this is different depending on your insurance coverage. However, we may be able to find a substitute medication at lower cost or fill out paperwork to get insurance to cover a needed medication.   If a prior authorization is required to get your medication covered by your insurance company, please allow [place] 1-2 business days to complete this process.  Drug prices often vary depending on where the prescription is filled and some pharmacies may offer cheaper prices.  The website www.goodrx.com contains coupons for medications through different pharmacies. The prices here do not account for what the cost may be with help from insurance (it may be cheaper with your insurance), but the website can give you the price if you did not use any insurance.  - You can print the associated coupon and take it with your prescription to the pharmacy.  - You may also stop by our office during regular business hours and pick up a GoodRx coupon card.  - If you need your prescription sent electronically to a different pharmacy, notify our office through [facility] or by phone at [phone] option 4.

## 2021-04-06 ENCOUNTER — Encounter: Payer: Self-pay | Admitting: Family Medicine

## 2021-04-10 NOTE — Telephone Encounter (Signed)
We are not open on 04/14/2021 as it is a holiday is there any other day this week you can or did you want to be seen next week?

## 2021-04-13 ENCOUNTER — Ambulatory Visit: Payer: Medicaid Other | Admitting: Family Medicine

## 2021-04-17 ENCOUNTER — Encounter: Payer: Self-pay | Admitting: Dermatology

## 2021-04-18 ENCOUNTER — Other Ambulatory Visit: Payer: Self-pay

## 2021-04-18 ENCOUNTER — Ambulatory Visit: Payer: Medicaid Other

## 2021-04-18 DIAGNOSIS — L209 Atopic dermatitis, unspecified: Secondary | ICD-10-CM

## 2021-04-18 MED ORDER — DUPILUMAB 300 MG/2ML ~~LOC~~ SOAJ
300.0000 mg | Freq: Once | SUBCUTANEOUS | Status: AC
  Administered 2021-04-18: 300 mg via SUBCUTANEOUS

## 2021-04-18 NOTE — Progress Notes (Signed)
Patient here for two week Dupixent injection.   Dupixent 300mg /57mL injected SQ into left arm. Patient tolerated well.   LOT: [id number]  EXP: 09/29/2022

## 2021-04-19 ENCOUNTER — Encounter: Payer: Self-pay | Admitting: Family Medicine

## 2021-04-19 ENCOUNTER — Ambulatory Visit (INDEPENDENT_AMBULATORY_CARE_PROVIDER_SITE_OTHER): Payer: Medicaid Other | Admitting: Family Medicine

## 2021-04-19 VITALS — BP 144/78 | HR 91 | Temp 98.3°F | Wt 195.0 lb

## 2021-04-19 DIAGNOSIS — L299 Pruritus, unspecified: Secondary | ICD-10-CM | POA: Diagnosis not present

## 2021-04-19 DIAGNOSIS — R059 Cough, unspecified: Secondary | ICD-10-CM | POA: Diagnosis not present

## 2021-04-19 LAB — VERITOR FLU A/B WAIVED
Influenza A: NEGATIVE
Influenza B: NEGATIVE

## 2021-04-19 MED ORDER — GABAPENTIN 100 MG PO CAPS: 100.0000 mg | ORAL_CAPSULE | Freq: Three times a day (TID) | ORAL | 3 refills | Status: DC

## 2021-04-19 NOTE — Progress Notes (Signed)
BP (!) 144/78   Pulse 91   Temp 98.3 F (36.8 C)   Wt 195 lb (88.5 kg)   SpO2 98%   BMI 31.96 kg/m    Subjective:    Patient ID: Jesus Guerrero, male    DOB: 1971/06/30, 50 y.o.   MRN: [id number]  HPI: Jesus Guerrero is a 50 y.o. male  Chief Complaint  Patient presents with  . Hair/Scalp Problem    Patient states he has an irritated nerve on his scalp for about two years, patient states it is itchy.   . Emesis    Patient states he threw up this morning after coughing while eating breakfast.    UPPER RESPIRATORY TRACT INFECTION Duration: today Worst symptom: cough, throwing up Fever: no Cough: yes Shortness of breath: no Wheezing: no Chest pain: yes, with cough Chest tightness: no Chest congestion: no Nasal congestion: yes Runny nose: yes Post nasal drip: yes Sneezing: yes Sore throat: no Swollen glands: no Sinus pressure: yes Headache: no Face pain: no Toothache: no Ear pain: no  Ear pressure: no  Eyes red/itching:yes Eye drainage/crusting: yes  Vomiting: yes Rash: no Fatigue: yes Sick contacts: yes Strep contacts: no  Context: just started Recurrent sinusitis: no Relief with OTC cold/cough medications: no  Treatments attempted: none   His dermatologist is concerned about an irritated nerve in his scalp because he is always having issues with the itching. They recommend treating it.   Relevant past medical, surgical, family and social history reviewed and updated as indicated. Interim medical history since our last visit reviewed. Allergies and medications reviewed and updated.  Review of Systems  Constitutional: Negative.   HENT: Positive for congestion, postnasal drip and rhinorrhea. Negative for dental problem, drooling, ear discharge, ear pain, facial swelling, hearing loss, mouth sores, nosebleeds, sinus pressure, sinus pain, sneezing, sore throat, tinnitus, trouble swallowing and voice change.   Respiratory: Positive for shortness of  breath. Negative for apnea, cough, choking, chest tightness, wheezing and stridor.   Cardiovascular: Negative.   Gastrointestinal: Negative.   Musculoskeletal: Negative.   Psychiatric/Behavioral: Negative.     Per HPI unless specifically indicated above     Objective:    BP (!) 144/78   Pulse 91   Temp 98.3 F (36.8 C)   Wt 195 lb (88.5 kg)   SpO2 98%   BMI 31.96 kg/m   Wt Readings from Last 3 Encounters:  04/19/21 195 lb (88.5 kg)  03/31/21 195 lb 6.4 oz (88.6 kg)  02/24/21 193 lb 6.4 oz (87.7 kg)    Physical Exam Vitals and nursing note reviewed.  Constitutional:      General: He is not in acute distress.    Appearance: Normal appearance. He is not ill-appearing, toxic-appearing or diaphoretic.  HENT:     Head: Normocephalic and atraumatic.     Right Ear: Tympanic membrane, ear canal and external ear normal.     Left Ear: Tympanic membrane, ear canal and external ear normal.     Nose: Nose normal. No congestion or rhinorrhea.     Mouth/Throat:     Mouth: Mucous membranes are moist.     Pharynx: Oropharynx is clear. No oropharyngeal exudate or posterior oropharyngeal erythema.  Eyes:     General: No scleral icterus.       Right eye: No discharge.        Left eye: No discharge.     Extraocular Movements: Extraocular movements intact.     Conjunctiva/sclera: Conjunctivae normal.  Pupils: Pupils are equal, round, and reactive to light.  Cardiovascular:     Rate and Rhythm: Normal rate and regular rhythm.     Pulses: Normal pulses.     Heart sounds: Normal heart sounds. No murmur heard. No friction rub. No gallop.   Pulmonary:     Effort: Pulmonary effort is normal. No respiratory distress.     Breath sounds: Normal breath sounds. No stridor. No wheezing, rhonchi or rales.  Chest:     Chest wall: No tenderness.  Musculoskeletal:        General: Normal range of motion.     Cervical back: Normal range of motion and neck supple.  Skin:    General: Skin is warm  and dry.     Capillary Refill: Capillary refill takes less than 2 seconds.     Coloration: Skin is not jaundiced or pale.     Findings: No bruising, erythema, lesion or rash.  Neurological:     General: No focal deficit present.     Mental Status: He is alert and oriented to person, place, and time. Mental status is at baseline.  Psychiatric:        Mood and Affect: Mood normal.        Behavior: Behavior normal.        Thought Content: Thought content normal.        Judgment: Judgment normal.     Results for orders placed or performed in visit on 04/19/21  Novel Coronavirus, NAA ([facility])   Specimen: Saline  Result Value Ref Range   SARS-CoV-2, NAA Not Detected Not Detected  SARS-COV-2, NAA 2 DAY TAT  Result Value Ref Range   SARS-CoV-2, NAA 2 DAY TAT Performed   Veritor Flu A/B Waived  Result Value Ref Range   Influenza A Negative Negative   Influenza B Negative Negative      Assessment & Plan:   Problem List Items Addressed This Visit   None   Visit Diagnoses    Cough    -  Primary   No sign of bacterial infection. Advised OTC medication. Checking for COVID and Flu. Call if not getting better or getting worse.    Relevant Orders   Veritor Flu A/B Waived (Completed)   Novel Coronavirus, NAA ([facility]) (Completed)   Pruritus       Will start on gabapentin and recheck 1 month. Call with any concerns. Continue to monitor.        Follow up plan: Return in about 4 weeks (around 05/17/2021).

## 2021-04-20 ENCOUNTER — Encounter: Payer: Self-pay | Admitting: Family Medicine

## 2021-04-20 ENCOUNTER — Other Ambulatory Visit: Payer: Self-pay | Admitting: Dermatology

## 2021-04-20 ENCOUNTER — Ambulatory Visit: Payer: Medicaid Other | Admitting: Family Medicine

## 2021-04-20 DIAGNOSIS — L209 Atopic dermatitis, unspecified: Secondary | ICD-10-CM

## 2021-04-20 LAB — SARS-COV-2, NAA 2 DAY TAT

## 2021-04-20 LAB — NOVEL CORONAVIRUS, NAA: SARS-CoV-2, NAA: NOT DETECTED

## 2021-04-21 ENCOUNTER — Encounter: Payer: Self-pay | Admitting: Family Medicine

## 2021-04-23 ENCOUNTER — Encounter: Payer: Self-pay | Admitting: Dermatology

## 2021-04-24 ENCOUNTER — Encounter: Payer: Self-pay | Admitting: Dermatology

## 2021-04-26 ENCOUNTER — Encounter: Payer: Self-pay | Admitting: Dermatology

## 2021-04-27 ENCOUNTER — Encounter: Payer: Self-pay | Admitting: Family Medicine

## 2021-04-27 ENCOUNTER — Other Ambulatory Visit: Payer: Self-pay | Admitting: Dermatology

## 2021-05-02 ENCOUNTER — Encounter: Payer: Self-pay | Admitting: Family Medicine

## 2021-05-04 ENCOUNTER — Encounter: Payer: Self-pay | Admitting: Dermatology

## 2021-05-04 ENCOUNTER — Encounter: Payer: Self-pay | Admitting: Family Medicine

## 2021-05-06 ENCOUNTER — Encounter: Payer: Self-pay | Admitting: Family Medicine

## 2021-05-07 ENCOUNTER — Encounter: Payer: Self-pay | Admitting: Family Medicine

## 2021-05-09 ENCOUNTER — Ambulatory Visit: Payer: Medicaid Other | Admitting: Dermatology

## 2021-05-16 ENCOUNTER — Encounter: Payer: Self-pay | Admitting: Family Medicine

## 2021-05-16 ENCOUNTER — Other Ambulatory Visit: Payer: Self-pay

## 2021-05-16 ENCOUNTER — Encounter: Payer: Self-pay | Admitting: Dermatology

## 2021-05-16 ENCOUNTER — Ambulatory Visit (INDEPENDENT_AMBULATORY_CARE_PROVIDER_SITE_OTHER): Payer: Medicaid Other | Admitting: Family Medicine

## 2021-05-16 VITALS — BP 148/83 | HR 88 | Temp 98.3°F | Wt 195.0 lb

## 2021-05-16 DIAGNOSIS — R059 Cough, unspecified: Secondary | ICD-10-CM | POA: Diagnosis not present

## 2021-05-16 DIAGNOSIS — L299 Pruritus, unspecified: Secondary | ICD-10-CM

## 2021-05-16 MED ORDER — BENZONATATE 200 MG PO CAPS: 200.0000 mg | ORAL_CAPSULE | Freq: Two times a day (BID) | ORAL | 0 refills | Status: DC | PRN

## 2021-05-16 NOTE — Progress Notes (Signed)
BP (!) 148/83   Pulse 88   Temp 98.3 F (36.8 C)   Wt 195 lb (88.5 kg)   SpO2 98%   BMI 31.96 kg/m    Subjective:    Patient ID: Jesus Guerrero, male    DOB: [birth year], 50 y.o.   MRN: [id number]  HPI: Jesus Guerrero is a 50 y.o. male  Chief Complaint  Patient presents with  . Pruritis    Follow up   . Cough    Patient states his cough has not completely cleared up yet    Continues to have headaches and itching on his head about every day. Worse when he gets a hair cut. Feeling better. Not happening as often, and itching less intense. He is tolerating the gabapentin well. He does not want to go up on his dose. He follows up with his dermatologist tomorrow.   UPPER RESPIRATORY TRACT INFECTION Duration: 1 week Worst symptom: cough Fever: no Cough: yes Shortness of breath: no Wheezing: no Chest pain: no Chest tightness: no Chest congestion: no Nasal congestion: yes Runny nose: yes Post nasal drip: no Sneezing: no Sore throat: no Swollen glands: no Sinus pressure: yes Headache: no Face pain: no Toothache: no Ear pain: no  Ear pressure: yes, R  Eyes red/itching:yes Eye drainage/crusting: yes  Vomiting: no Rash: no Fatigue: no Sick contacts: no Strep contacts: no  Context: stable Recurrent sinusitis: no Relief with OTC cold/cough medications: no  Treatments attempted: cold/sinus   Relevant past medical, surgical, family and social history reviewed and updated as indicated. Interim medical history since our last visit reviewed. Allergies and medications reviewed and updated.  Review of Systems  Constitutional: Negative.   HENT: Positive for congestion, sinus pressure and sore throat. Negative for dental problem, drooling, ear discharge, ear pain, facial swelling, hearing loss, mouth sores, nosebleeds, postnasal drip, rhinorrhea, sinus pain, sneezing, tinnitus, trouble swallowing and voice change.   Respiratory: Positive for cough. Negative for apnea,  choking, chest tightness, shortness of breath, wheezing and stridor.   Cardiovascular: Negative.   Gastrointestinal: Negative.   Musculoskeletal: Negative.   Neurological: Positive for headaches.  Psychiatric/Behavioral: Negative.     Per HPI unless specifically indicated above     Objective:    BP (!) 148/83   Pulse 88   Temp 98.3 F (36.8 C)   Wt 195 lb (88.5 kg)   SpO2 98%   BMI 31.96 kg/m   Wt Readings from Last 3 Encounters:  05/16/21 195 lb (88.5 kg)  04/19/21 195 lb (88.5 kg)  03/31/21 195 lb 6.4 oz (88.6 kg)    Physical Exam Vitals and nursing note reviewed.  Constitutional:      General: He is not in acute distress.    Appearance: Normal appearance. He is not ill-appearing, toxic-appearing or diaphoretic.  HENT:     Head: Normocephalic and atraumatic.     Right Ear: Tympanic membrane and external ear normal.     Left Ear: Tympanic membrane and external ear normal.     Nose: Nose normal. No congestion or rhinorrhea.     Mouth/Throat:     Mouth: Mucous membranes are moist.     Pharynx: Oropharynx is clear. No oropharyngeal exudate or posterior oropharyngeal erythema.  Eyes:     General: No scleral icterus.       Right eye: No discharge.        Left eye: No discharge.     Extraocular Movements: Extraocular movements intact.  Conjunctiva/sclera: Conjunctivae normal.     Pupils: Pupils are equal, round, and reactive to light.  Cardiovascular:     Rate and Rhythm: Normal rate and regular rhythm.     Pulses: Normal pulses.     Heart sounds: Normal heart sounds. No murmur heard. No friction rub. No gallop.   Pulmonary:     Effort: Pulmonary effort is normal. No respiratory distress.     Breath sounds: Normal breath sounds. No stridor. No wheezing, rhonchi or rales.  Chest:     Chest wall: No tenderness.  Musculoskeletal:        General: Normal range of motion.     Cervical back: Normal range of motion and neck supple.  Skin:    General: Skin is warm and  dry.     Capillary Refill: Capillary refill takes less than 2 seconds.     Coloration: Skin is not jaundiced or pale.     Findings: No bruising, erythema, lesion or rash.  Neurological:     General: No focal deficit present.     Mental Status: He is alert and oriented to person, place, and time. Mental status is at baseline.  Psychiatric:        Mood and Affect: Mood normal.        Behavior: Behavior normal.        Thought Content: Thought content normal.        Judgment: Judgment normal.     Results for orders placed or performed in visit on 04/19/21  Novel Coronavirus, NAA ([facility])   Specimen: Saline  Result Value Ref Range   SARS-CoV-2, NAA Not Detected Not Detected  SARS-COV-2, NAA 2 DAY TAT  Result Value Ref Range   SARS-CoV-2, NAA 2 DAY TAT Performed   Veritor Flu A/B Waived  Result Value Ref Range   Influenza A Negative Negative   Influenza B Negative Negative      Assessment & Plan:   Problem List Items Addressed This Visit   None   Visit Diagnoses    Pruritus    -  Primary   Improving on gabapentin. He does not want to go up on the dose right now. Continue current regimen. Continue to monitor. Call with any concerns.    Cough       Tessalon perles sent. No sign of infection. Call if not getting better or getting worse.        Follow up plan: Return in about 2 months (around 07/16/2021).

## 2021-05-17 ENCOUNTER — Ambulatory Visit: Payer: Medicaid Other | Admitting: Dermatology

## 2021-05-17 ENCOUNTER — Encounter: Payer: Self-pay | Admitting: Family Medicine

## 2021-05-17 ENCOUNTER — Encounter: Payer: Self-pay | Admitting: Dermatology

## 2021-05-17 DIAGNOSIS — L2389 Allergic contact dermatitis due to other agents: Secondary | ICD-10-CM | POA: Diagnosis not present

## 2021-05-17 DIAGNOSIS — L700 Acne vulgaris: Secondary | ICD-10-CM

## 2021-05-17 DIAGNOSIS — L209 Atopic dermatitis, unspecified: Secondary | ICD-10-CM

## 2021-05-17 MED ORDER — ADBRY 150 MG/ML ~~LOC~~ SOSY: 600.0000 mg | PREFILLED_SYRINGE | Freq: Once | SUBCUTANEOUS | 0 refills | Status: AC

## 2021-05-17 MED ORDER — ADBRY 150 MG/ML ~~LOC~~ SOSY: 300.0000 mg | PREFILLED_SYRINGE | SUBCUTANEOUS | 4 refills | Status: DC

## 2021-05-17 MED ORDER — CLINDAMYCIN PHOS-BENZOYL PEROX 1.2-5 % EX GEL: CUTANEOUS | 2 refills | Status: DC

## 2021-05-17 MED ORDER — DUPILUMAB 300 MG/2ML ~~LOC~~ SOAJ
300.0000 mg | Freq: Once | SUBCUTANEOUS | Status: AC
  Administered 2021-05-17: 300 mg via SUBCUTANEOUS

## 2021-05-17 NOTE — Progress Notes (Signed)
   Follow-Up Visit   Subjective  Jesus Guerrero is a 50 y.o. male who presents for the following: Follow-up (Patient here today for 4 week follow up of atopic dermatitis. ). He reports some stubborn areas at the face and hands.    The following portions of the chart were reviewed this encounter and updated as appropriate:  Tobacco  Allergies  Meds  Problems  Med Hx  Surg Hx  Fam Hx      Objective  Well appearing patient in no apparent distress; mood and affect are within normal limits.  A focused examination was performed including face and hands . Relevant physical exam findings are noted in the Assessment and Plan.  Objective  bilateral hands: Scaly pink plaques  Objective  face: Few scattered inflammatory papules at face  Assessment & Plan  Atopic dermatitis, unspecified type bilateral hands  Chronic condition with duration over one year. Condition is bothersome to patient. Not currently at goal.  Atopic dermatitis (eczema) is a chronic, relapsing, pruritic condition that can significantly affect quality of life. It is often associated with allergic rhinitis and/or asthma and can require treatment with topical medications, phototherapy, or in severe cases a biologic medication called Dupixent in older children and adults.   Pending approval will start Adbry (and d/c dupixent)  Medication sent to [facility]   Consider Rinvoq if adbry not covered  D/c clobetasol   Restart tacrolimus twice a day to affected areas  Tralokinumab-ldrm (ADBRY) 150 MG/ML SOSY - bilateral hands  Tralokinumab-ldrm (ADBRY) 150 MG/ML SOSY - bilateral hands  Dupilumab SOPN 300 mg - bilateral hands  Other Related Medications dupilumab (DUPIXENT) prefilled syringe 300 mg clobetasol (OLUX) 0.05 % topical foam mupirocin ointment (BACTROBAN) 2 %  Acne vulgaris face  Possible component of atopic dermatis  Restart Clindamycin Benzolyl gel - apply to face twice daily.   Start  Tacrolimus apply to face in evenings.       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 He has been unable to travel to [facility] for more comprehensive patch testing.   Return in about 8 years (around 05/17/2029) for follow up on atopic derm.  I, [name], CMA, am acting as scribe for [name], MD.  Documentation: I have reviewed the above documentation for accuracy and completeness, and I agree with the above.  [name], MD

## 2021-05-17 NOTE — Patient Instructions (Addendum)
Atopic dermatitis - Severe, on Dupixent (biologic medication).  Atopic dermatitis (eczema) is a chronic, relapsing, pruritic condition that can significantly affect quality of life. It is often associated with allergic rhinitis and/or asthma and can require treatment with topical medications, phototherapy, or in severe cases a biologic medication called Dupixent.   Dupilumab (Dupixent) is a treatment given by injection for adults with moderate-to-severe atopic dermatitis. Goal is control of skin condition, not cure. It is given as 2 injections at the first dose followed by 1 injection ever 2 weeks thereafter.  Potential side effects include allergic reaction, herpes infections, injection site reactions and conjunctivitis (inflammation of the eyes).  The use of Dupixent requires long term medication management, including periodic office visits.  Recommend OTC [place] Rapid Relief Anti-Itch cream (pramoxine + menthol) up to 3 times per day to areas that are itchy.  Use Tacrolimus on top of head   If you have any questions or concerns for your doctor, please call our main line at [phone] and press option 4 to reach your doctor's medical assistant. If no one answers, please leave a voicemail as directed and we will return your call as soon as possible. Messages left after 4 pm will be answered the following business day.   You may also send [place] a message via [place]. We typically respond to MyChart messages within 1-2 business days.  For prescription refills, please ask your pharmacy to contact our office. Our fax number is [phone].  If you have an urgent issue when the clinic is closed that cannot wait until the next business day, you can page your doctor at the number below.    Please note that while we do our best to be available for urgent issues outside of office hours, we are not available 24/7.   If you have an urgent issue and are unable to reach [place], you may choose to seek  medical care at your doctor's office, retail clinic, urgent care center, or emergency room.  If you have a medical emergency, please immediately call 911 or go to the emergency department.  Pager Numbers  - Dr. Nehemiah Massed: [phone]  - Dr. Laurence Ferrari: [phone]  - Dr. Nicole Kindred: [phone]  In the event of inclement weather, please call our main line at [phone] for an update on the status of any delays or closures.  Dermatology Medication Tips: Please keep the boxes that topical medications come in in order to help keep track of the instructions about where and how to use these. Pharmacies typically print the medication instructions only on the boxes and not directly on the medication tubes.   If your medication is too expensive, please contact our office at [phone] option 4 or send [place] a message through [place].   We are unable to tell what your co-pay for medications will be in advance as this is different depending on your insurance coverage. However, we may be able to find a substitute medication at lower cost or fill out paperwork to get insurance to cover a needed medication.   If a prior authorization is required to get your medication covered by your insurance company, please allow [place] 1-2 business days to complete this process.  Drug prices often vary depending on where the prescription is filled and some pharmacies may offer cheaper prices.  The website www.goodrx.com contains coupons for medications through different pharmacies. The prices here do not account for what the cost may be with help from insurance (it may be cheaper with  your insurance), but the website can give you the price if you did not use any insurance.  - You can print the associated coupon and take it with your prescription to the pharmacy.  - You may also stop by our office during regular business hours and pick up a GoodRx coupon card.  - If you need your prescription sent electronically to a  different pharmacy, notify our office through [facility] or by phone at [phone] option 4.

## 2021-05-22 ENCOUNTER — Encounter: Payer: Self-pay | Admitting: Dermatology

## 2021-05-23 ENCOUNTER — Other Ambulatory Visit: Payer: Self-pay | Admitting: Dermatology

## 2021-05-23 DIAGNOSIS — L209 Atopic dermatitis, unspecified: Secondary | ICD-10-CM

## 2021-05-24 ENCOUNTER — Encounter: Payer: Self-pay | Admitting: Dermatology

## 2021-05-24 ENCOUNTER — Encounter: Payer: Self-pay | Admitting: Family Medicine

## 2021-05-29 ENCOUNTER — Encounter: Payer: Self-pay | Admitting: Family Medicine

## 2021-05-31 ENCOUNTER — Ambulatory Visit: Payer: Medicaid Other

## 2021-06-01 ENCOUNTER — Encounter: Payer: Self-pay | Admitting: Dermatology

## 2021-06-03 ENCOUNTER — Other Ambulatory Visit: Payer: Self-pay | Admitting: Dermatology

## 2021-06-03 DIAGNOSIS — L209 Atopic dermatitis, unspecified: Secondary | ICD-10-CM

## 2021-06-04 ENCOUNTER — Other Ambulatory Visit: Payer: Self-pay | Admitting: Dermatology

## 2021-06-04 ENCOUNTER — Encounter: Payer: Self-pay | Admitting: Family Medicine

## 2021-06-04 DIAGNOSIS — L209 Atopic dermatitis, unspecified: Secondary | ICD-10-CM

## 2021-06-05 ENCOUNTER — Encounter: Payer: Self-pay | Admitting: Family Medicine

## 2021-06-05 ENCOUNTER — Ambulatory Visit: Payer: Medicaid Other

## 2021-06-06 ENCOUNTER — Encounter: Payer: Self-pay | Admitting: Family Medicine

## 2021-06-07 ENCOUNTER — Other Ambulatory Visit: Payer: Self-pay | Admitting: Dermatology

## 2021-06-07 DIAGNOSIS — L209 Atopic dermatitis, unspecified: Secondary | ICD-10-CM

## 2021-06-08 ENCOUNTER — Encounter: Payer: Self-pay | Admitting: Dermatology

## 2021-06-09 ENCOUNTER — Other Ambulatory Visit: Payer: Self-pay

## 2021-06-09 DIAGNOSIS — L700 Acne vulgaris: Secondary | ICD-10-CM

## 2021-06-09 MED ORDER — CLINDAMYCIN PHOS-BENZOYL PEROX 1.2-5 % EX GEL: CUTANEOUS | 2 refills | Status: DC

## 2021-06-09 NOTE — Progress Notes (Signed)
Refill of Clindamycin-Benzoyl Peroxide gel sent to [facility] [address].

## 2021-06-16 ENCOUNTER — Encounter: Payer: Self-pay | Admitting: Family Medicine

## 2021-06-16 ENCOUNTER — Other Ambulatory Visit: Payer: Self-pay

## 2021-06-16 ENCOUNTER — Ambulatory Visit (INDEPENDENT_AMBULATORY_CARE_PROVIDER_SITE_OTHER): Payer: Medicaid Other | Admitting: Family Medicine

## 2021-06-16 VITALS — BP 133/73 | HR 96 | Wt 198.4 lb

## 2021-06-16 DIAGNOSIS — M10071 Idiopathic gout, right ankle and foot: Secondary | ICD-10-CM

## 2021-06-16 NOTE — Progress Notes (Signed)
BP 133/73   Pulse 96   Wt 198 lb 6.4 oz (90 kg)   SpO2 96%   BMI 32.51 kg/m    Subjective:    Patient ID: Jesus Guerrero, male    DOB: [birth year], 50 y.o.   MRN: [id number]  HPI: Jesus Guerrero is a 50 y.o. male  Chief Complaint  Patient presents with   Toe Pain    Patient states his right foot hurts for about 2 weeks. Pain started with big toe now whole foot hurts.   TOE PAIN Duration: 2 weeks Involved toe: rightbig toe and his foot into his ankle Mechanism of injury: unknown Onset: sudden Severity: 7/10  Quality: aching pain Frequency: constant Radiation: yes Aggravating factors: nothing  Alleviating factors: nothing  Status: stable Treatments attempted: nothing  Relief with NSAIDs?: No NSAIDs Taken Morning stiffness: no Redness: no  Bruising: no Swelling: yes Paresthesias / decreased sensation: no Fevers: no  Relevant past medical, surgical, family and social history reviewed and updated as indicated. Interim medical history since our last visit reviewed. Allergies and medications reviewed and updated.  Review of Systems  Constitutional: Negative.   Respiratory: Negative.    Cardiovascular: Negative.   Gastrointestinal: Negative.   Musculoskeletal:  Positive for arthralgias. Negative for back pain, gait problem, joint swelling, myalgias, neck pain and neck stiffness.  Skin: Negative.   Neurological: Negative.   Psychiatric/Behavioral: Negative.     Per HPI unless specifically indicated above     Objective:    BP 133/73   Pulse 96   Wt 198 lb 6.4 oz (90 kg)   SpO2 96%   BMI 32.51 kg/m   Wt Readings from Last 3 Encounters:  06/16/21 198 lb 6.4 oz (90 kg)  05/16/21 195 lb (88.5 kg)  04/19/21 195 lb (88.5 kg)    Physical Exam Vitals and nursing note reviewed.  Constitutional:      General: He is not in acute distress.    Appearance: Normal appearance. He is not ill-appearing, toxic-appearing or diaphoretic.  HENT:     Head:  Normocephalic and atraumatic.     Right Ear: External ear normal.     Left Ear: External ear normal.     Nose: Nose normal.     Mouth/Throat:     Mouth: Mucous membranes are moist.     Pharynx: Oropharynx is clear.  Eyes:     General: No scleral icterus.       Right eye: No discharge.        Left eye: No discharge.     Extraocular Movements: Extraocular movements intact.     Conjunctiva/sclera: Conjunctivae normal.     Pupils: Pupils are equal, round, and reactive to light.  Cardiovascular:     Rate and Rhythm: Normal rate and regular rhythm.     Pulses: Normal pulses.     Heart sounds: Normal heart sounds. No murmur heard.   No friction rub. No gallop.  Pulmonary:     Effort: Pulmonary effort is normal. No respiratory distress.     Breath sounds: Normal breath sounds. No stridor. No wheezing, rhonchi or rales.  Chest:     Chest wall: No tenderness.  Musculoskeletal:        General: Swelling (mild at R MTP joint, no redness or heat) present. Normal range of motion.     Cervical back: Normal range of motion and neck supple.  Skin:    General: Skin is warm and dry.  Capillary Refill: Capillary refill takes less than 2 seconds.     Coloration: Skin is not jaundiced or pale.     Findings: No bruising, erythema, lesion or rash.  Neurological:     General: No focal deficit present.     Mental Status: He is alert and oriented to person, place, and time. Mental status is at baseline.  Psychiatric:        Mood and Affect: Mood normal.        Behavior: Behavior normal.        Thought Content: Thought content normal.        Judgment: Judgment normal.    Results for orders placed or performed in visit on 04/19/21  Novel Coronavirus, NAA ([facility])   Specimen: Saline  Result Value Ref Range   SARS-CoV-2, NAA Not Detected Not Detected  SARS-COV-2, NAA 2 DAY TAT  Result Value Ref Range   SARS-CoV-2, NAA 2 DAY TAT Performed   Veritor Flu A/B Waived  Result Value Ref Range    Influenza A Negative Negative   Influenza B Negative Negative      Assessment & Plan:   Problem List Items Addressed This Visit   None Visit Diagnoses     Acute idiopathic gout involving toe of right foot    -  Primary   Unclear if gout or arthritis- will check labs and treat as needed. If normal labs, will get him back into podiatry. Call with any concerns.    Relevant Orders   Uric acid   Comprehensive metabolic panel        Follow up plan: Return if symptoms worsen or fail to improve.

## 2021-06-17 ENCOUNTER — Encounter: Payer: Self-pay | Admitting: Family Medicine

## 2021-06-17 LAB — COMPREHENSIVE METABOLIC PANEL
ALT: 19 IU/L (ref 0–44)
AST: 18 IU/L (ref 0–40)
Albumin/Globulin Ratio: 1.3 (ref 1.2–2.2)
Albumin: 4.1 g/dL (ref 3.8–4.9)
Alkaline Phosphatase: 106 IU/L (ref 44–121)
BUN/Creatinine Ratio: 17 (ref 9–20)
BUN: 19 mg/dL (ref 6–24)
Bilirubin Total: 0.3 mg/dL (ref 0.0–1.2)
CO2: 22 mmol/L (ref 20–29)
Calcium: 9.3 mg/dL (ref 8.7–10.2)
Chloride: 103 mmol/L (ref 96–106)
Creatinine, Ser: 1.1 mg/dL (ref 0.76–1.27)
Globulin, Total: 3.1 g/dL (ref 1.5–4.5)
Glucose: 114 mg/dL — ABNORMAL HIGH (ref 65–99)
Potassium: 4.2 mmol/L (ref 3.5–5.2)
Sodium: 140 mmol/L (ref 134–144)
Total Protein: 7.2 g/dL (ref 6.0–8.5)
eGFR: 81 mL/min/{1.73_m2} (ref >59)

## 2021-06-17 LAB — URIC ACID: Uric Acid: 4.8 mg/dL (ref 3.8–8.4)

## 2021-06-18 ENCOUNTER — Other Ambulatory Visit: Payer: Self-pay | Admitting: Family Medicine

## 2021-06-18 DIAGNOSIS — M79675 Pain in left toe(s): Secondary | ICD-10-CM

## 2021-06-18 MED ORDER — PREDNISONE 50 MG PO TABS: 50.0000 mg | ORAL_TABLET | Freq: Every day | ORAL | 0 refills | Status: DC

## 2021-06-20 ENCOUNTER — Encounter: Payer: Self-pay | Admitting: Podiatry

## 2021-06-21 ENCOUNTER — Ambulatory Visit: Payer: Medicaid Other | Admitting: Podiatry

## 2021-06-23 ENCOUNTER — Ambulatory Visit: Payer: Medicaid Other | Admitting: Family Medicine

## 2021-06-25 ENCOUNTER — Encounter: Payer: Self-pay | Admitting: Family Medicine

## 2021-06-27 ENCOUNTER — Ambulatory Visit: Payer: Medicaid Other | Admitting: Dermatology

## 2021-06-27 ENCOUNTER — Encounter: Payer: Self-pay | Admitting: Family Medicine

## 2021-06-27 ENCOUNTER — Other Ambulatory Visit: Payer: Self-pay | Admitting: Family Medicine

## 2021-06-27 ENCOUNTER — Encounter: Payer: Self-pay | Admitting: Dermatology

## 2021-06-27 ENCOUNTER — Other Ambulatory Visit: Payer: Self-pay

## 2021-06-27 DIAGNOSIS — L2089 Other atopic dermatitis: Secondary | ICD-10-CM

## 2021-06-27 DIAGNOSIS — T148XXA Other injury of unspecified body region, initial encounter: Secondary | ICD-10-CM

## 2021-06-27 DIAGNOSIS — L209 Atopic dermatitis, unspecified: Secondary | ICD-10-CM

## 2021-06-27 MED ORDER — TRALOKINUMAB-LDRM 150 MG/ML ~~LOC~~ SOSY
600.0000 mg | PREFILLED_SYRINGE | Freq: Once | SUBCUTANEOUS | Status: AC
  Administered 2021-06-27: 600 mg via SUBCUTANEOUS

## 2021-06-27 MED ORDER — EUCRISA 2 % EX OINT
TOPICAL_OINTMENT | CUTANEOUS | 5 refills | Status: DC
Start: 2021-06-27 — End: 2021-11-14

## 2021-06-27 MED ORDER — PROTOPIC 0.1 % EX OINT: TOPICAL_OINTMENT | Freq: Two times a day (BID) | CUTANEOUS | 1 refills | Status: DC

## 2021-06-27 MED ORDER — MUPIROCIN 2 % EX OINT: 1.0000 "application " | TOPICAL_OINTMENT | Freq: Every day | CUTANEOUS | 2 refills | Status: DC

## 2021-06-27 NOTE — Patient Instructions (Addendum)
Continue Eucrisa 1-2 times daily to hands  Start mupirocin daily to area at left elbow and right ear and cover with band aid.   Tralokinumab [place]) is a treatment given by injection for adults with moderate-to-severe atopic dermatitis. Goal is control of skin condition, not cure. It is given as 4 injections at the first dose followed by 2 injection ever 2 weeks thereafter.  Potential side effects include allergic reaction, injection site reactions and conjunctivitis (inflammation of the eyes).  The use of Adbry requires long term medication management, including periodic office visits.  If you have any questions or concerns for your doctor, please call our main line at [phone] and press option 4 to reach your doctor's medical assistant. If no one answers, please leave a voicemail as directed and we will return your call as soon as possible. Messages left after 4 pm will be answered the following business day.   You may also send [place] a message via [place]. We typically respond to MyChart messages within 1-2 business days.  For prescription refills, please ask your pharmacy to contact our office. Our fax number is [phone].  If you have an urgent issue when the clinic is closed that cannot wait until the next business day, you can page your doctor at the number below.    Please note that while we do our best to be available for urgent issues outside of office hours, we are not available 24/7.   If you have an urgent issue and are unable to reach [place], you may choose to seek medical care at your doctor's office, retail clinic, urgent care center, or emergency room.  If you have a medical emergency, please immediately call 911 or go to the emergency department.  Pager Numbers  - Dr. Nehemiah Massed: [phone]  - Dr. Laurence Ferrari: [phone]  - Dr. Nicole Kindred: [phone]  In the event of inclement weather, please call our main line at [phone] for an update on the status of any delays or  closures.  Dermatology Medication Tips: Please keep the boxes that topical medications come in in order to help keep track of the instructions about where and how to use these. Pharmacies typically print the medication instructions only on the boxes and not directly on the medication tubes.   If your medication is too expensive, please contact our office at [phone] option 4 or send [place] a message through [place].   We are unable to tell what your co-pay for medications will be in advance as this is different depending on your insurance coverage. However, we may be able to find a substitute medication at lower cost or fill out paperwork to get insurance to cover a needed medication.   If a prior authorization is required to get your medication covered by your insurance company, please allow [place] 1-2 business days to complete this process.  Drug prices often vary depending on where the prescription is filled and some pharmacies may offer cheaper prices.  The website www.goodrx.com contains coupons for medications through different pharmacies. The prices here do not account for what the cost may be with help from insurance (it may be cheaper with your insurance), but the website can give you the price if you did not use any insurance.  - You can print the associated coupon and take it with your prescription to the pharmacy.  - You may also stop by our office during regular business hours and pick up a GoodRx coupon card.  - If you need your  prescription sent electronically to a different pharmacy, notify our office through [facility] or by phone at [phone] option 4.

## 2021-06-27 NOTE — Progress Notes (Signed)
   Follow-Up Visit   Subjective  Jesus Guerrero is a 50 y.o. male who presents for the following: Dermatitis (Patient here today for atopic dermatitis follow up. His last Dupixent injection was 05/17/21 and is here today to start [place]. ).  Patient only using Eucrisa and occasionally protopic at hands. Hands still inflamed but better.  The following portions of the chart were reviewed this encounter and updated as appropriate:   Tobacco  Allergies  Meds  Problems  Med Hx  Surg Hx  Fam Hx       Review of Systems:  No other skin or systemic complaints except as noted in HPI or Assessment and Plan.  Objective  Well appearing patient in no apparent distress; mood and affect are within normal limits.  A focused examination was performed including hands, arms. Relevant physical exam findings are noted in the Assessment and Plan.  hands Scaly erythematous patches at dorsal hands   Left Elbow, right postauricular Excoriation    Assessment & Plan  Other atopic dermatitis hands  Chronic condition with duration over one year. Condition is bothersome to patient. Not currently at goal.  Continue Eucrisa 1-2 times daily as needed.   Start Adbry 600mg /mL today, 300mg /mL injected to each arm. Patient tolerated well. Will continue Adbry 300mg /mL SQ Q 2 weeks  Tralokinumab (Adbry) is a treatment given by injection for adults with moderate-to-severe atopic dermatitis. Goal is control of skin condition, not cure. It is given as 4 injections at the first dose followed by 2 injection ever 2 weeks thereafter.  Potential side effects include allergic reaction, injection site reactions and conjunctivitis (inflammation of the eyes).  The use of Adbry requires long term medication management, including periodic office visits.  Crisaborole (EUCRISA) 2 % OINT - hands Apply to hands 1-2 times daily as needed  Related Medications PROTOPIC 0.1 % ointment Apply topically 2 (two) times  daily.  Excoriation (2) right postauricular; Left Elbow  Start mupirocin ointment daily and cover.  Avoid scratching.  Benign-appearing.  Call if not resolving.  Related Medications mupirocin ointment (BACTROBAN) 2 % Apply 1 application topically daily.    Return for 6-8 week follow up for dermatitis, 2 week with nurse for Adbry injection.  [name], RMA, am acting as scribe for [name], MD .  Documentation: I have reviewed the above documentation for accuracy and completeness, and I agree with the above.  [name], MD

## 2021-06-28 ENCOUNTER — Other Ambulatory Visit: Payer: Self-pay | Admitting: Family Medicine

## 2021-06-28 MED ORDER — CYCLOBENZAPRINE HCL 10 MG PO TABS: 10.0000 mg | ORAL_TABLET | Freq: Every day | ORAL | 3 refills | Status: DC

## 2021-06-28 NOTE — Telephone Encounter (Signed)
Pt has apt on 06/30/2021

## 2021-06-28 NOTE — Telephone Encounter (Signed)
Requested medication (s) are due for refill today:  yes   Requested medication (s) are on the active medication list: yes   Last refill:  5/23/20222  Future visit scheduled: yes   Notes to clinic:  this refill cannot be delegated    Requested Prescriptions  Pending Prescriptions Disp Refills   cyclobenzaprine (FLEXERIL) 10 MG tablet [Pharmacy Med Name: Cyclobenzaprine HCl 10 MG Oral Tablet] 30 tablet 0    Sig: TAKE 1 TABLET BY MOUTH AT BEDTIME      Not Delegated - Analgesics:  Muscle Relaxants Failed - 06/27/2021  9:16 PM      Failed - This refill cannot be delegated      Passed - Valid encounter within last 6 months    Recent Outpatient Visits           1 week ago Acute idiopathic gout involving toe of right foot   [place], [place], DO   1 month ago Pruritus   [place], [place], DO   2 months ago Cough   [place], DO   2 months ago Routine general medical examination at a health care facility   [place], [place], DO   4 months ago [place] toe pain, left   [employer], [place], DO       Future Appointments             In 2 days Wynetta Emery, Barb Merino, DO [employer], [place]   In 4 months [place], Gordan Payment [facility]

## 2021-06-29 ENCOUNTER — Other Ambulatory Visit: Payer: Self-pay | Admitting: Family Medicine

## 2021-06-29 ENCOUNTER — Encounter: Payer: Self-pay | Admitting: Family Medicine

## 2021-06-29 ENCOUNTER — Encounter: Payer: Self-pay | Admitting: Dermatology

## 2021-06-30 ENCOUNTER — Encounter: Payer: Self-pay | Admitting: Family Medicine

## 2021-06-30 ENCOUNTER — Other Ambulatory Visit: Payer: Self-pay

## 2021-06-30 ENCOUNTER — Ambulatory Visit: Payer: Medicaid Other | Admitting: Family Medicine

## 2021-06-30 VITALS — BP 145/79 | HR 98 | Temp 98.2°F | Wt 197.6 lb

## 2021-06-30 DIAGNOSIS — E782 Mixed hyperlipidemia: Secondary | ICD-10-CM

## 2021-06-30 DIAGNOSIS — L739 Follicular disorder, unspecified: Secondary | ICD-10-CM

## 2021-06-30 DIAGNOSIS — Z23 Encounter for immunization: Secondary | ICD-10-CM | POA: Diagnosis not present

## 2021-06-30 MED ORDER — GABAPENTIN 100 MG PO CAPS: 100.0000 mg | ORAL_CAPSULE | Freq: Three times a day (TID) | ORAL | 1 refills | Status: DC

## 2021-06-30 NOTE — Telephone Encounter (Signed)
appt

## 2021-06-30 NOTE — Progress Notes (Signed)
BP (!) 145/79   Pulse 98   Temp 98.2 F (36.8 C)   Wt 197 lb 9.6 oz (89.6 kg)   SpO2 99%   BMI 32.38 kg/m    Subjective:    Patient ID: Jesus Guerrero, male    DOB: 1971-09-16, 50 y.o.   MRN: [id number]  HPI: Jesus Guerrero is a 50 y.o. male  Chief Complaint  Patient presents with   Hyperlipidemia   HYPERLIPIDEMIA Hyperlipidemia status: excellent compliance Satisfied with current treatment?  yes Side effects:  no Medication compliance: excellent compliance Past cholesterol meds: atorvastatin Supplements: none Aspirin:  no The 10-year ASCVD risk score [name], et al., 2013) is: 4.3%   Values used to calculate the score:     Age: 11 years     Sex: Male     Is Non-Hispanic African American: No     Diabetic: No     Tobacco smoker: No     Systolic Blood Pressure: 631 mmHg     Is BP treated: No     HDL Cholesterol: 48 mg/dL     Total Cholesterol: 180 mg/dL Chest pain:  no Coronary artery disease:  no  RASH Duration:  1 day  Location: L chest  Itching: yes Burning: no Redness: yes Oozing: no Scaling: no Blisters: no Painful: yes Fevers: no Change in detergents/soaps/personal care products: no Recent illness: no Recent travel:no History of same: no Context: stable Alleviating factors: nothing Treatments attempted:hydrocortisone cream Shortness of breath: no  Throat/tongue swelling: no Myalgias/arthralgias: no  Relevant past medical, surgical, family and social history reviewed and updated as indicated. Interim medical history since our last visit reviewed. Allergies and medications reviewed and updated.  Review of Systems  Constitutional: Negative.   Respiratory: Negative.    Cardiovascular: Negative.   Gastrointestinal: Negative.   Musculoskeletal: Negative.   Skin: Negative.   Neurological: Negative.   Psychiatric/Behavioral: Negative.     Per HPI unless specifically indicated above     Objective:    BP (!) 145/79   Pulse 98    Temp 98.2 F (36.8 C)   Wt 197 lb 9.6 oz (89.6 kg)   SpO2 99%   BMI 32.38 kg/m   Wt Readings from Last 3 Encounters:  06/30/21 197 lb 9.6 oz (89.6 kg)  06/16/21 198 lb 6.4 oz (90 kg)  05/16/21 195 lb (88.5 kg)    Physical Exam Vitals and nursing note reviewed.  Constitutional:      General: He is not in acute distress.    Appearance: Normal appearance. He is not ill-appearing, toxic-appearing or diaphoretic.  HENT:     Head: Normocephalic and atraumatic.     Right Ear: External ear normal.     Left Ear: External ear normal.     Nose: Nose normal.     Mouth/Throat:     Mouth: Mucous membranes are moist.     Pharynx: Oropharynx is clear.  Eyes:     General: No scleral icterus.       Right eye: No discharge.        Left eye: No discharge.     Extraocular Movements: Extraocular movements intact.     Conjunctiva/sclera: Conjunctivae normal.     Pupils: Pupils are equal, round, and reactive to light.  Cardiovascular:     Rate and Rhythm: Normal rate and regular rhythm.     Pulses: Normal pulses.     Heart sounds: Normal heart sounds. No murmur heard.   No  friction rub. No gallop.  Pulmonary:     Effort: Pulmonary effort is normal. No respiratory distress.     Breath sounds: Normal breath sounds. No stridor. No wheezing, rhonchi or rales.  Chest:     Chest wall: No tenderness.  Musculoskeletal:        General: Normal range of motion.     Cervical back: Normal range of motion and neck supple.  Skin:    General: Skin is warm and dry.     Capillary Refill: Capillary refill takes less than 2 seconds.     Coloration: Skin is not jaundiced or pale.     Findings: No bruising, erythema, lesion or rash.     Comments: 1 spot of folliculitis on L chest  Neurological:     General: No focal deficit present.     Mental Status: He is alert and oriented to person, place, and time. Mental status is at baseline.  Psychiatric:        Mood and Affect: Mood normal.        Behavior:  Behavior normal.        Thought Content: Thought content normal.        Judgment: Judgment normal.    Results for orders placed or performed in visit on 06/16/21  Uric acid  Result Value Ref Range   Uric Acid 4.8 3.8 - 8.4 mg/dL  Comprehensive metabolic panel  Result Value Ref Range   Glucose 114 (H) 65 - 99 mg/dL   BUN 19 6 - 24 mg/dL   Creatinine, Ser 1.10 0.76 - 1.27 mg/dL   eGFR 81 >59 mL/min/1.73   BUN/Creatinine Ratio 17 9 - 20   Sodium 140 134 - 144 mmol/L   Potassium 4.2 3.5 - 5.2 mmol/L   Chloride 103 96 - 106 mmol/L   CO2 22 20 - 29 mmol/L   Calcium 9.3 8.7 - 10.2 mg/dL   Total Protein 7.2 6.0 - 8.5 g/dL   Albumin 4.1 3.8 - 4.9 g/dL   Globulin, Total 3.1 1.5 - 4.5 g/dL   Albumin/Globulin Ratio 1.3 1.2 - 2.2   Bilirubin Total 0.3 0.0 - 1.2 mg/dL   Alkaline Phosphatase 106 44 - 121 IU/L   AST 18 0 - 40 IU/L   ALT 19 0 - 44 IU/L   *Note: Due to a large number of results and/or encounters for the requested time period, some results have not been displayed. A complete set of results can be found in Results Review.      Assessment & Plan:   Problem List Items Addressed This Visit       Other   Hyperlipidemia    Under good control on current regimen. Continue current regimen. Continue to monitor. Call with any concerns. Refills up to date.         Other Visit Diagnoses     Folliculitis    -  Primary   Encouraged warm compresses and mupirocin. Call if not getting better.         Follow up plan: Return in about 3 months (around 09/30/2021) for  and 2nd shingrix.

## 2021-07-02 ENCOUNTER — Encounter: Payer: Self-pay | Admitting: Dermatology

## 2021-07-03 ENCOUNTER — Encounter: Payer: Self-pay | Admitting: Family Medicine

## 2021-07-03 NOTE — Assessment & Plan Note (Signed)
Under good control on current regimen. Continue current regimen. Continue to monitor. Call with any concerns. Refills up to date.   

## 2021-07-04 ENCOUNTER — Encounter: Payer: Self-pay | Admitting: Dermatology

## 2021-07-05 ENCOUNTER — Other Ambulatory Visit: Payer: Self-pay

## 2021-07-06 ENCOUNTER — Telehealth: Payer: Self-pay

## 2021-07-06 ENCOUNTER — Other Ambulatory Visit: Payer: Self-pay

## 2021-07-06 MED ORDER — HYDROCORTISONE 2.5 % EX CREA: TOPICAL_CREAM | Freq: Two times a day (BID) | CUTANEOUS | 1 refills | Status: DC | PRN

## 2021-07-06 NOTE — Telephone Encounter (Signed)
Patient states [facility] can no longer order name brand Protopic and generic is not covered by his insurance. Loma Sousa did a PA for him today but I wanted to let you know incase you wanted to prescribe something different.

## 2021-07-06 NOTE — Telephone Encounter (Signed)
MyChart message sent to patient.

## 2021-07-07 ENCOUNTER — Other Ambulatory Visit: Payer: Self-pay | Admitting: Dermatology

## 2021-07-07 DIAGNOSIS — L239 Allergic contact dermatitis, unspecified cause: Secondary | ICD-10-CM

## 2021-07-10 ENCOUNTER — Telehealth: Payer: Self-pay

## 2021-07-10 NOTE — Telephone Encounter (Signed)
Patient's Adbry to be delivered 07/12/21 per Senderra./js

## 2021-07-11 ENCOUNTER — Other Ambulatory Visit: Payer: Self-pay

## 2021-07-11 ENCOUNTER — Ambulatory Visit: Payer: Medicaid Other

## 2021-07-11 DIAGNOSIS — L2089 Other atopic dermatitis: Secondary | ICD-10-CM

## 2021-07-11 MED ORDER — TRALOKINUMAB-LDRM 150 MG/ML ~~LOC~~ SOSY
300.0000 mg | PREFILLED_SYRINGE | Freq: Once | SUBCUTANEOUS | Status: AC
  Administered 2021-07-11: 300 mg via SUBCUTANEOUS

## 2021-07-11 NOTE — Progress Notes (Addendum)
Pt here for 2 week Adbry injection. (2) 150 mg/mL Adbry injected into right arm. Pt tolerated well.   MBP:[id number]  Exp: 08/2022

## 2021-07-12 ENCOUNTER — Other Ambulatory Visit: Payer: Self-pay

## 2021-07-12 ENCOUNTER — Encounter: Payer: Self-pay | Admitting: Dermatology

## 2021-07-12 DIAGNOSIS — L2089 Other atopic dermatitis: Secondary | ICD-10-CM

## 2021-07-12 MED ORDER — PROTOPIC 0.1 % EX OINT: TOPICAL_OINTMENT | Freq: Two times a day (BID) | CUTANEOUS | 1 refills | Status: DC

## 2021-07-18 ENCOUNTER — Encounter: Payer: Self-pay | Admitting: Family Medicine

## 2021-07-26 ENCOUNTER — Encounter: Payer: Self-pay | Admitting: Dermatology

## 2021-07-26 ENCOUNTER — Other Ambulatory Visit: Payer: Self-pay

## 2021-07-26 ENCOUNTER — Ambulatory Visit: Payer: Medicaid Other | Admitting: Dermatology

## 2021-07-26 DIAGNOSIS — T148XXA Other injury of unspecified body region, initial encounter: Secondary | ICD-10-CM

## 2021-07-26 DIAGNOSIS — L2089 Other atopic dermatitis: Secondary | ICD-10-CM

## 2021-07-26 DIAGNOSIS — L739 Follicular disorder, unspecified: Secondary | ICD-10-CM | POA: Diagnosis not present

## 2021-07-26 DIAGNOSIS — L281 Prurigo nodularis: Secondary | ICD-10-CM

## 2021-07-26 MED ORDER — TRALOKINUMAB-LDRM 150 MG/ML ~~LOC~~ SOSY
300.0000 mg | PREFILLED_SYRINGE | Freq: Once | SUBCUTANEOUS | Status: AC
  Administered 2021-07-26: 300 mg via SUBCUTANEOUS

## 2021-07-26 NOTE — Progress Notes (Signed)
   Follow-Up Visit   Subjective  Jesus Guerrero is a 50 y.o. male who presents for the following: Follow-up (Pt here for 2 week Adbry injections. He also reports a rash on back x approx 2 weeks. ).  The following portions of the chart were reviewed this encounter and updated as appropriate:  Tobacco  Allergies  Meds  Problems  Med Hx  Surg Hx  Fam Hx      Review of Systems: No other skin or systemic complaints except as noted in HPI or Assessment and Plan.   Objective  Well appearing patient in no apparent distress; mood and affect are within normal limits.  A focused examination was performed including back, arms, hands, left foot. Relevant physical exam findings are noted in the Assessment and Plan.  hands, back Scaly pink papules coalescing to plaques at arms left chest and back  Right Upper Back Perifollicular erythematous papules   Scalp Pink plaque  left great toe Hematoma after fall   Assessment & Plan  Other atopic dermatitis hands, back  Chronic condition with duration or expected duration over one year. Condition is bothersome to patient. Not currently at goal.  Apply protopic to aa's for itch twice a day.   Adbry 300 mg injected into left arm today. Pt tolerated well.   Lot: [id number] Exp: 08/2022  Atopic dermatitis (eczema) is a chronic, relapsing, pruritic condition that can significantly affect quality of life. It is often associated with allergic rhinitis and/or asthma and can require treatment with topical medications, phototherapy, or in severe cases a biologic medication called Dupixent in children and adults.    Related Medications Crisaborole (EUCRISA) 2 % OINT Apply to hands 1-2 times daily as needed  PROTOPIC 0.1 % ointment Apply topically 2 (two) times daily.  Tralokinumab-ldrm SOSY [id number] mg   Folliculitis Right Upper Back  Use Head and Shoulders shampoo as a body wash. Leave on for 5 minutes before rinsing.   Prurigo  nodularis Scalp  Intralesional injection - Scalp Location: scalp  Informed Consent: Discussed risks (infection, pain, bleeding, bruising, thinning of the skin, loss of skin pigment, lack of resolution, and recurrence of lesion) and benefits of the procedure, as well as the alternatives. Informed consent was obtained. Preparation: The area was prepared a standard fashion.   Procedure Details: An intralesional injection was performed with Kenalog 3 mg/cc. 0.3 cc in total were injected.  Total number of injections: 8  Plan: The patient was instructed on post-op care. Recommend OTC analgesia as needed for pain.   Hematoma left great toe  No loss of sensation. Recommend letting heal with time. Call PCP if any changes or worsening.  Return in about 2 weeks (around 08/09/2021) for Atopic derm.  I, Harriett Sine, CMA, am acting as scribe for [name], MD.   Documentation: I have reviewed the above documentation for accuracy and completeness, and I agree with the above.  [name], MD

## 2021-07-31 ENCOUNTER — Encounter: Payer: Self-pay | Admitting: Family Medicine

## 2021-08-04 ENCOUNTER — Ambulatory Visit (INDEPENDENT_AMBULATORY_CARE_PROVIDER_SITE_OTHER): Payer: Medicaid Other | Admitting: Family Medicine

## 2021-08-04 ENCOUNTER — Encounter: Payer: Self-pay | Admitting: Family Medicine

## 2021-08-04 ENCOUNTER — Other Ambulatory Visit: Payer: Self-pay

## 2021-08-04 VITALS — BP 134/71 | HR 92 | Temp 99.0°F | Wt 194.0 lb

## 2021-08-04 DIAGNOSIS — M79672 Pain in left foot: Secondary | ICD-10-CM

## 2021-08-04 DIAGNOSIS — M79671 Pain in right foot: Secondary | ICD-10-CM | POA: Diagnosis not present

## 2021-08-04 NOTE — Progress Notes (Signed)
 BP 134/71 (BP Location: Left Arm, Cuff Size: Normal)   Pulse 92   Temp 99 F (37.2 C) (Oral)   Wt 194 lb (88 kg)   SpO2 97%   BMI 31.79 kg/m    Subjective:    Patient ID: Jesus Guerrero, male    DOB: 02/09/1971, 50 y.o.   MRN: [id number]  HPI: Jesus Guerrero is a 50 y.o. male  Chief Complaint  Patient presents with   Foot Pain    Pt states he has been having bilateral foot pain for the last 2 weeks, constant pain per pain and rating at an 8 today. Requesting referral to podiatry   FOOT PAIN Duration: 2 weeks Involved foot: bilateral Mechanism of injury: unknown Location: throughout Onset: sudden  Severity: 8/10  Quality:  aching and sore Frequency: constant Radiation: no Aggravating factors: walking  Alleviating factors: nothing  Status: worse Treatments attempted: rest, ice, heat, APAP, ibuprofen, and aleve  Relief with NSAIDs?:  mild Weakness with weight bearing or walking: no Morning stiffness: no Swelling: no Redness: no Bruising: no Paresthesias / decreased sensation: no  Fevers:no  Relevant past medical, surgical, family and social history reviewed and updated as indicated. Interim medical history since our last visit reviewed. Allergies and medications reviewed and updated.  Review of Systems  Constitutional: Negative.   Respiratory: Negative.    Cardiovascular: Negative.   Musculoskeletal:  Positive for myalgias. Negative for arthralgias, back pain, gait problem, joint swelling, neck pain and neck stiffness.  Neurological: Negative.   Psychiatric/Behavioral: Negative.     Per HPI unless specifically indicated above     Objective:    BP 134/71 (BP Location: Left Arm, Cuff Size: Normal)   Pulse 92   Temp 99 F (37.2 C) (Oral)   Wt 194 lb (88 kg)   SpO2 97%   BMI 31.79 kg/m   Wt Readings from Last 3 Encounters:  08/04/21 194 lb (88 kg)  06/30/21 197 lb 9.6 oz (89.6 kg)  06/16/21 198 lb 6.4 oz (90 kg)    Physical Exam Vitals and  nursing note reviewed.  Constitutional:      General: He is not in acute distress.    Appearance: Normal appearance. He is not ill-appearing, toxic-appearing or diaphoretic.  HENT:     Head: Normocephalic and atraumatic.     Right Ear: External ear normal.     Left Ear: External ear normal.     Nose: Nose normal.     Mouth/Throat:     Mouth: Mucous membranes are moist.     Pharynx: Oropharynx is clear.  Eyes:     General: No scleral icterus.       Right eye: No discharge.        Left eye: No discharge.     Extraocular Movements: Extraocular movements intact.     Conjunctiva/sclera: Conjunctivae normal.     Pupils: Pupils are equal, round, and reactive to light.  Cardiovascular:     Rate and Rhythm: Normal rate and regular rhythm.     Pulses: Normal pulses.     Heart sounds: Normal heart sounds. No murmur heard.   No friction rub. No gallop.  Pulmonary:     Effort: Pulmonary effort is normal. No respiratory distress.     Breath sounds: Normal breath sounds. No stridor. No wheezing, rhonchi or rales.  Chest:     Chest wall: No tenderness.  Musculoskeletal:        General: Normal range of motion.       Cervical back: Normal range of motion and neck supple.  Skin:    General: Skin is warm and dry.     Capillary Refill: Capillary refill takes less than 2 seconds.     Coloration: Skin is not jaundiced or pale.     Findings: No bruising, erythema, lesion or rash.  Neurological:     General: No focal deficit present.     Mental Status: He is alert and oriented to person, place, and time. Mental status is at baseline.  Psychiatric:        Mood and Affect: Mood normal.        Behavior: Behavior normal.        Thought Content: Thought content normal.        Judgment: Judgment normal.    Results for orders placed or performed in visit on 06/16/21  Uric acid  Result Value Ref Range   Uric Acid 4.8 3.8 - 8.4 mg/dL  Comprehensive metabolic panel  Result Value Ref Range   Glucose  114 (H) 65 - 99 mg/dL   BUN 19 6 - 24 mg/dL   Creatinine, Ser 1.10 0.76 - 1.27 mg/dL   eGFR 81 >59 mL/min/1.73   BUN/Creatinine Ratio 17 9 - 20   Sodium 140 134 - 144 mmol/L   Potassium 4.2 3.5 - 5.2 mmol/L   Chloride 103 96 - 106 mmol/L   CO2 22 20 - 29 mmol/L   Calcium 9.3 8.7 - 10.2 mg/dL   Total Protein 7.2 6.0 - 8.5 g/dL   Albumin 4.1 3.8 - 4.9 g/dL   Globulin, Total 3.1 1.5 - 4.5 g/dL   Albumin/Globulin Ratio 1.3 1.2 - 2.2   Bilirubin Total 0.3 0.0 - 1.2 mg/dL   Alkaline Phosphatase 106 44 - 121 IU/L   AST 18 0 - 40 IU/L   ALT 19 0 - 44 IU/L   *Note: Due to a large number of results and/or encounters for the requested time period, some results have not been displayed. A complete set of results can be found in Results Review.      Assessment & Plan:   Problem List Items Addressed This Visit   None Visit Diagnoses     Bilateral foot pain    -  Primary   Will get him back into see his podiatrist. Call with any concerns. Continue to monitor.         Follow up plan: Return as scheduled.       

## 2021-08-06 ENCOUNTER — Encounter: Payer: Self-pay | Admitting: Dermatology

## 2021-08-07 ENCOUNTER — Telehealth: Payer: Self-pay

## 2021-08-07 NOTE — Telephone Encounter (Signed)
I have been told by multiple pharmacies that they have stopped making Protopic so this patient was unable to get from pharmacy. I did a PA to Medicaid for the generic and it was denied. Can we try switching to Elidel?

## 2021-08-08 ENCOUNTER — Encounter: Payer: Self-pay | Admitting: Dermatology

## 2021-08-08 ENCOUNTER — Encounter: Payer: Self-pay | Admitting: Podiatry

## 2021-08-08 ENCOUNTER — Encounter: Payer: Self-pay | Admitting: Family Medicine

## 2021-08-08 ENCOUNTER — Ambulatory Visit (INDEPENDENT_AMBULATORY_CARE_PROVIDER_SITE_OTHER): Payer: Medicaid Other

## 2021-08-08 ENCOUNTER — Ambulatory Visit: Payer: Medicaid Other | Admitting: Podiatry

## 2021-08-08 ENCOUNTER — Other Ambulatory Visit: Payer: Self-pay

## 2021-08-08 VITALS — BP 154/75 | HR 96 | Temp 98.8°F | Resp 16

## 2021-08-08 DIAGNOSIS — M7752 Other enthesopathy of left foot: Secondary | ICD-10-CM

## 2021-08-08 DIAGNOSIS — R609 Edema, unspecified: Secondary | ICD-10-CM

## 2021-08-08 DIAGNOSIS — M19172 Post-traumatic osteoarthritis, left ankle and foot: Secondary | ICD-10-CM | POA: Diagnosis not present

## 2021-08-08 DIAGNOSIS — M778 Other enthesopathies, not elsewhere classified: Secondary | ICD-10-CM

## 2021-08-08 MED ORDER — ELIDEL 1 % EX CREA: TOPICAL_CREAM | Freq: Two times a day (BID) | CUTANEOUS | 2 refills | Status: DC

## 2021-08-08 MED ORDER — METHYLPREDNISOLONE 4 MG PO TBPK: ORAL_TABLET | ORAL | 0 refills | Status: DC

## 2021-08-08 NOTE — Telephone Encounter (Signed)
RX sent and advised patient through [facility].

## 2021-08-08 NOTE — Telephone Encounter (Signed)
Yes, thank you.
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
## 2021-08-09 ENCOUNTER — Ambulatory Visit: Payer: Medicaid Other | Admitting: Dermatology

## 2021-08-12 ENCOUNTER — Encounter: Payer: Self-pay | Admitting: Family Medicine

## 2021-08-13 ENCOUNTER — Encounter: Payer: Self-pay | Admitting: Dermatology

## 2021-08-15 ENCOUNTER — Encounter: Payer: Self-pay | Admitting: Podiatry

## 2021-08-15 ENCOUNTER — Encounter: Payer: Self-pay | Admitting: Family Medicine

## 2021-08-15 NOTE — Telephone Encounter (Signed)
Please offer patient an appointment. Thank you

## 2021-08-16 ENCOUNTER — Other Ambulatory Visit: Payer: Self-pay

## 2021-08-16 ENCOUNTER — Ambulatory Visit: Payer: Medicaid Other | Admitting: Dermatology

## 2021-08-16 ENCOUNTER — Encounter: Payer: Self-pay | Admitting: Dermatology

## 2021-08-16 DIAGNOSIS — L2089 Other atopic dermatitis: Secondary | ICD-10-CM

## 2021-08-16 DIAGNOSIS — L304 Erythema intertrigo: Secondary | ICD-10-CM

## 2021-08-16 DIAGNOSIS — L281 Prurigo nodularis: Secondary | ICD-10-CM

## 2021-08-16 MED ORDER — TRALOKINUMAB-LDRM 150 MG/ML ~~LOC~~ SOSY
300.0000 mg | PREFILLED_SYRINGE | Freq: Once | SUBCUTANEOUS | Status: AC
  Administered 2021-08-16: 300 mg via SUBCUTANEOUS

## 2021-08-16 MED ORDER — KETOCONAZOLE 2 % EX CREA: 1.0000 "application " | TOPICAL_CREAM | Freq: Two times a day (BID) | CUTANEOUS | 2 refills | Status: AC

## 2021-08-16 NOTE — Patient Instructions (Addendum)
Start ketoconazole 2% cream to affected areas at groin twice daily. May also use Elidel.   Continue Elidel up to twice daily as needed to hands and scalp.   If you have any questions or concerns for your doctor, please call our main line at [phone] and press option 4 to reach your doctor's medical assistant. If no one answers, please leave a voicemail as directed and we will return your call as soon as possible. Messages left after 4 pm will be answered the following business day.   You may also send [place] a message via [place]. We typically respond to MyChart messages within 1-2 business days.  For prescription refills, please ask your pharmacy to contact our office. Our fax number is [phone].  If you have an urgent issue when the clinic is closed that cannot wait until the next business day, you can page your doctor at the number below.    Please note that while we do our best to be available for urgent issues outside of office hours, we are not available 24/7.   If you have an urgent issue and are unable to reach [place], you may choose to seek medical care at your doctor's office, retail clinic, urgent care center, or emergency room.  If you have a medical emergency, please immediately call 911 or go to the emergency department.  Pager Numbers  - Dr. Nehemiah Massed: [phone]  - Dr. Laurence Ferrari: [phone]  - Dr. Nicole Kindred: [phone]  In the event of inclement weather, please call our main line at [phone] for an update on the status of any delays or closures.  Dermatology Medication Tips: Please keep the boxes that topical medications come in in order to help keep track of the instructions about where and how to use these. Pharmacies typically print the medication instructions only on the boxes and not directly on the medication tubes.   If your medication is too expensive, please contact our office at [phone] option 4 or send [place] a message through [place].   We are unable to  tell what your co-pay for medications will be in advance as this is different depending on your insurance coverage. However, we may be able to find a substitute medication at lower cost or fill out paperwork to get insurance to cover a needed medication.   If a prior authorization is required to get your medication covered by your insurance company, please allow [place] 1-2 business days to complete this process.  Drug prices often vary depending on where the prescription is filled and some pharmacies may offer cheaper prices.  The website www.goodrx.com contains coupons for medications through different pharmacies. The prices here do not account for what the cost may be with help from insurance (it may be cheaper with your insurance), but the website can give you the price if you did not use any insurance.  - You can print the associated coupon and take it with your prescription to the pharmacy.  - You may also stop by our office during regular business hours and pick up a GoodRx coupon card.  - If you need your prescription sent electronically to a different pharmacy, notify our office through [facility] or by phone at [phone] option 4.

## 2021-08-16 NOTE — Progress Notes (Signed)
   Follow-Up Visit   Subjective  Jesus Guerrero is a 51 y.o. male who presents for the following: Follow-up (Patient here today for atopic dermatitis follow up. Patient currently on Adbry and using Elidel topically. He does have a rash at right groin. Patient using Elidel at scalp and had ILK injections at last visit. He feels that scalp is doing a little better. ).  Patient does use Eucrisa at hands occasionally.   The following portions of the chart were reviewed this encounter and updated as appropriate:   Tobacco  Allergies  Meds  Problems  Med Hx  Surg Hx  Fam Hx      Review of Systems:  No other skin or systemic complaints except as noted in HPI or Assessment and Plan.  Objective  Well appearing patient in no apparent distress; mood and affect are within normal limits.  A focused examination was performed including scalp, hands, groin. Relevant physical exam findings are noted in the Assessment and Plan.  Inguinal folds Erythematous patches  hands Erythematous patches, improved  Scalp Pink plaque   Assessment & Plan  Erythema intertrigo Inguinal folds  Intertrigo is a chronic recurrent rash that occurs in skin fold areas that may be associated with friction; heat; moisture; yeast; fungus; and bacteria.  It is exacerbated by increased movement / activity; sweating; and higher atmospheric temperature.  Start ketoconazole 2% cream to affected areas at groin twice daily. May also use Elidel.   ketoconazole (NIZORAL) 2 % cream - Inguinal folds Apply 1 application topically 2 (two) times daily. To areas of rash at groin  Other atopic dermatitis hands  Chronic condition with duration or expected duration over one year. Condition is bothersome to patient. Not currently at goal.  Continue Adbry injections. Continue Elidel twice daily as needed.   If not clearing with Adbry, may consider Rinvoq  Adbry '300mg'$ /mL injected to right upper arm. Patient tolerated  well.   Related Medications Crisaborole (EUCRISA) 2 % OINT Apply to hands 1-2 times daily as needed  Tralokinumab-ldrm SOSY 300 mg   Prurigo nodularis Scalp  Intralesional steroid injection side effects were reviewed including thinning of the skin and discoloration, such as redness, lightening or darkening.  [place] [id number]  Intralesional injection - Scalp Location: vertex scalp  Informed Consent: Discussed risks (infection, pain, bleeding, bruising, thinning of the skin, loss of skin pigment, lack of resolution, and recurrence of lesion) and benefits of the procedure, as well as the alternatives. Informed consent was obtained. Preparation: The area was prepared a standard fashion.  Procedure Details: An intralesional injection was performed with Kenalog 2.5 mg/cc. 0.2 cc in total were injected.  Total number of injections: 5  Plan: The patient was instructed on post-op care. Recommend OTC analgesia as needed for pain.   Return in about 4 weeks (around 09/13/2021) for with Dr. Laurence Ferrari, 2 weeks with nurse.  [name], RMA, am acting as scribe for [name], MD .  Documentation: I have reviewed the above documentation for accuracy and completeness, and I agree with the above.  [name], MD

## 2021-08-16 NOTE — Progress Notes (Signed)
Entered in error

## 2021-08-21 ENCOUNTER — Encounter: Payer: Self-pay | Admitting: Dermatology

## 2021-08-27 ENCOUNTER — Encounter: Payer: Self-pay | Admitting: Family Medicine

## 2021-08-29 ENCOUNTER — Other Ambulatory Visit: Payer: Self-pay | Admitting: Dermatology

## 2021-08-29 ENCOUNTER — Encounter: Payer: Self-pay | Admitting: Dermatology

## 2021-08-29 DIAGNOSIS — L239 Allergic contact dermatitis, unspecified cause: Secondary | ICD-10-CM

## 2021-08-30 ENCOUNTER — Ambulatory Visit (INDEPENDENT_AMBULATORY_CARE_PROVIDER_SITE_OTHER): Payer: Medicaid Other

## 2021-08-30 ENCOUNTER — Encounter: Payer: Self-pay | Admitting: Dermatology

## 2021-08-30 ENCOUNTER — Other Ambulatory Visit: Payer: Self-pay

## 2021-08-30 DIAGNOSIS — L209 Atopic dermatitis, unspecified: Secondary | ICD-10-CM | POA: Diagnosis not present

## 2021-08-30 MED ORDER — KETOCONAZOLE 2 % EX SHAM: MEDICATED_SHAMPOO | CUTANEOUS | 0 refills | Status: DC

## 2021-08-30 MED ORDER — TRALOKINUMAB-LDRM 150 MG/ML ~~LOC~~ SOSY
300.0000 mg | PREFILLED_SYRINGE | Freq: Once | SUBCUTANEOUS | Status: AC
  Administered 2021-08-30: 300 mg via SUBCUTANEOUS

## 2021-08-30 NOTE — Progress Notes (Signed)
Patient here for two week Adbry injections. Adbry '300mg'$  injected into left upper arm. Patient tolerated well.  LOT: [id number] EXP: 01/2023

## 2021-09-02 ENCOUNTER — Encounter: Payer: Self-pay | Admitting: Podiatry

## 2021-09-05 ENCOUNTER — Other Ambulatory Visit: Payer: Self-pay

## 2021-09-05 ENCOUNTER — Encounter: Payer: Self-pay | Admitting: Podiatry

## 2021-09-05 ENCOUNTER — Ambulatory Visit: Payer: Medicaid Other | Admitting: Podiatry

## 2021-09-05 DIAGNOSIS — M7752 Other enthesopathy of left foot: Secondary | ICD-10-CM | POA: Diagnosis not present

## 2021-09-05 DIAGNOSIS — M19172 Post-traumatic osteoarthritis, left ankle and foot: Secondary | ICD-10-CM

## 2021-09-05 DIAGNOSIS — M778 Other enthesopathies, not elsewhere classified: Secondary | ICD-10-CM
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
## 2021-09-06 ENCOUNTER — Encounter: Payer: Self-pay | Admitting: Podiatry

## 2021-09-07 ENCOUNTER — Encounter: Payer: Medicaid Other | Admitting: Dermatology

## 2021-09-07 ENCOUNTER — Encounter: Payer: Self-pay | Admitting: Podiatry

## 2021-09-12 ENCOUNTER — Other Ambulatory Visit: Payer: Self-pay | Admitting: Dermatology

## 2021-09-12 ENCOUNTER — Ambulatory Visit: Payer: Medicaid Other | Admitting: Dermatology

## 2021-09-12 ENCOUNTER — Other Ambulatory Visit: Payer: Self-pay

## 2021-09-12 DIAGNOSIS — L578 Other skin changes due to chronic exposure to nonionizing radiation: Secondary | ICD-10-CM

## 2021-09-12 DIAGNOSIS — Z86018 Personal history of other benign neoplasm: Secondary | ICD-10-CM

## 2021-09-12 DIAGNOSIS — Z1283 Encounter for screening for malignant neoplasm of skin: Secondary | ICD-10-CM

## 2021-09-12 DIAGNOSIS — S00411A Abrasion of right ear, initial encounter: Secondary | ICD-10-CM | POA: Diagnosis not present

## 2021-09-12 DIAGNOSIS — D1800 Hemangioma unspecified site: Secondary | ICD-10-CM

## 2021-09-12 DIAGNOSIS — D229 Melanocytic nevi, unspecified: Secondary | ICD-10-CM

## 2021-09-12 DIAGNOSIS — T148XXA Other injury of unspecified body region, initial encounter: Secondary | ICD-10-CM

## 2021-09-12 DIAGNOSIS — L209 Atopic dermatitis, unspecified: Secondary | ICD-10-CM | POA: Diagnosis not present

## 2021-09-12 DIAGNOSIS — L281 Prurigo nodularis: Secondary | ICD-10-CM | POA: Diagnosis not present

## 2021-09-12 DIAGNOSIS — L821 Other seborrheic keratosis: Secondary | ICD-10-CM

## 2021-09-12 DIAGNOSIS — L814 Other melanin hyperpigmentation: Secondary | ICD-10-CM

## 2021-09-12 MED ORDER — TRALOKINUMAB-LDRM 150 MG/ML ~~LOC~~ SOSY
300.0000 mg | PREFILLED_SYRINGE | Freq: Once | SUBCUTANEOUS | Status: AC
  Administered 2021-09-12: 300 mg via SUBCUTANEOUS

## 2021-09-12 NOTE — Progress Notes (Signed)
Follow-Up Visit   Subjective  Jesus Guerrero is a 50 y.o. male who presents for the following: TBSE (Total body exam today. No hx of skin cancer. Pt has hx of dysplastic nevi. He reports nothing new or changing that he has noticed. No active flares of atopic dermatitis. Pt treating with Adbry qow - due today. Pt states that he is still having some itching at scalp that was previously treated with ILK injections.  ).  Patient here for full body skin exam and skin cancer screening.   The following portions of the chart were reviewed this encounter and updated as appropriate:  Tobacco  Allergies  Meds  Problems  Med Hx  Surg Hx  Fam Hx      Review of Systems: No other skin or systemic complaints except as noted in HPI or Assessment and Plan.   Objective  Well appearing patient in no apparent distress; mood and affect are within normal limits.  A full examination was performed including scalp, head, eyes, ears, nose, lips, neck, chest, axillae, abdomen, back, buttocks, bilateral upper extremities, bilateral lower extremities, hands, feet, fingers, toes, fingernails, and toenails. All findings within normal limits unless otherwise noted below.  Right Hand - Anterior Erythematous patches at hands  Right Ear excoriation  Scalp Pink plaque  Assessment & Plan  Atopic dermatitis, unspecified type Right Hand - Anterior  Chronic condition with duration or expected duration over one year. Currently well-controlled.  Atopic dermatitis (eczema) is a chronic, relapsing, pruritic condition that can significantly affect quality of life. It is often associated with allergic rhinitis and/or asthma and can require treatment with topical medications, phototherapy, or in severe cases a biologic medication called Dupixent in children and adults.   Continue Adbry 300 mg QOW.   300 mg Adbry injected into right upper arm today. Pt tolerated well. Lot: [id number] Exp: 01/2023   Related  Medications dupilumab (DUPIXENT) prefilled syringe 300 mg   clobetasol (OLUX) 0.05 % topical foam Apply topically 2 (two) times daily. To scalp. Avoid applying to face, groin, and axilla. Use as directed. Risk of skin atrophy with long-term use reviewed.  Tralokinumab-ldrm (ADBRY) 150 MG/ML SOSY Inject 2 mLs (300 mg total) into the skin every 14 (fourteen) days. Starting on day 15 for maintenance.  mupirocin ointment (BACTROBAN) 2 % APPLY TO OPEN AREAS DAILY AS NEEDED. KEEP COVERED WITH BANDAGE  Tralokinumab-ldrm SOSY 300 mg   Excoriation Right Ear  Apply mupirocin to affected area BID until healed.   Related Medications mupirocin ointment (BACTROBAN) 2 % Apply 1 application topically daily.  Prurigo nodularis Scalp  Intralesional injection - Scalp Location: scalp  Informed Consent: Discussed risks (infection, pain, bleeding, bruising, thinning of the skin, loss of skin pigment, lack of resolution, and recurrence of lesion) and benefits of the procedure, as well as the alternatives. Informed consent was obtained. Preparation: The area was prepared a standard fashion.   Procedure Details: An intralesional injection was performed with Kenalog 2 mg/cc. 0.5 cc in total were injected.  Total number of injections: 3  Plan: The patient was instructed on post-op care. Recommend OTC analgesia as needed for pain.   Lentigines - Scattered tan macules - Due to sun exposure - Benign-appearing, observe - Recommend daily broad spectrum sunscreen SPF 30+ to sun-exposed areas, reapply every 2 hours as needed. - Call for any changes  Seborrheic Keratoses - Stuck-on, waxy, tan-brown papules and/or plaques  - Benign-appearing - Discussed benign etiology and prognosis. - Observe - Call  for any changes  Melanocytic Nevi - Tan-brown and/or pink-flesh-colored symmetric macules and papules - Benign appearing on exam today - Observation - Call clinic for new or changing moles -  Recommend daily use of broad spectrum spf 30+ sunscreen to sun-exposed areas.   Hemangiomas - Red papules - Discussed benign nature - Observe - Call for any changes  Actinic Damage - Chronic condition, secondary to cumulative UV/sun exposure - diffuse scaly erythematous macules with underlying dyspigmentation - Recommend daily broad spectrum sunscreen SPF 30+ to sun-exposed areas, reapply every 2 hours as needed.  - Staying in the shade or wearing long sleeves, sun glasses (UVA+UVB protection) and wide brim hats (4-inch brim around the entire circumference of the hat) are also recommended for sun protection.  - Call for new or changing lesions.  Skin cancer screening performed today.  Return in about 8 weeks (around 11/07/2021) for 2 weeks with nurse, 8 weeks with Dr Laurence Ferrari.  I, Harriett Sine, CMA, am acting as scribe for [name], MD.  Documentation: I have reviewed the above documentation for accuracy and completeness, and I agree with the above.  [name], MD

## 2021-09-12 NOTE — Patient Instructions (Addendum)
Recommend daily broad spectrum sunscreen SPF 30+ to sun-exposed areas, reapply every 2 hours as needed. Call for new or changing lesions.  Staying in the shade or wearing long sleeves, sun glasses (UVA+UVB protection) and wide brim hats (4-inch brim around the entire circumference of the hat) are also recommended for sun protection.      Can use arnica cream on bruise to help heal faster.     If you have any questions or concerns for your doctor, please call our main line at [phone] and press option 4 to reach your doctor's medical assistant. If no one answers, please leave a voicemail as directed and we will return your call as soon as possible. Messages left after 4 pm will be answered the following business day.   You may also send [place] a message via [place]. We typically respond to MyChart messages within 1-2 business days.  For prescription refills, please ask your pharmacy to contact our office. Our fax number is [phone].  If you have an urgent issue when the clinic is closed that cannot wait until the next business day, you can page your doctor at the number below.    Please note that while we do our best to be available for urgent issues outside of office hours, we are not available 24/7.   If you have an urgent issue and are unable to reach [place], you may choose to seek medical care at your doctor's office, retail clinic, urgent care center, or emergency room.  If you have a medical emergency, please immediately call 911 or go to the emergency department.  Pager Numbers  - Dr. Nehemiah Massed: [phone]  - Dr. Laurence Ferrari: [phone]  - Dr. Nicole Kindred: [phone]  In the event of inclement weather, please call our main line at [phone] for an update on the status of any delays or closures.  Dermatology Medication Tips: Please keep the boxes that topical medications come in in order to help keep track of the instructions about where and how to use these. Pharmacies typically  print the medication instructions only on the boxes and not directly on the medication tubes.   If your medication is too expensive, please contact our office at [phone] option 4 or send [place] a message through [place].   We are unable to tell what your co-pay for medications will be in advance as this is different depending on your insurance coverage. However, we may be able to find a substitute medication at lower cost or fill out paperwork to get insurance to cover a needed medication.   If a prior authorization is required to get your medication covered by your insurance company, please allow [place] 1-2 business days to complete this process.  Drug prices often vary depending on where the prescription is filled and some pharmacies may offer cheaper prices.  The website www.goodrx.com contains coupons for medications through different pharmacies. The prices here do not account for what the cost may be with help from insurance (it may be cheaper with your insurance), but the website can give you the price if you did not use any insurance.  - You can print the associated coupon and take it with your prescription to the pharmacy.  - You may also stop by our office during regular business hours and pick up a GoodRx coupon card.  - If you need your prescription sent electronically to a different pharmacy, notify our office through [facility] or by phone at [phone] option 4.

## 2021-09-22 ENCOUNTER — Other Ambulatory Visit: Payer: Medicaid Other

## 2021-09-22 ENCOUNTER — Encounter: Payer: Self-pay | Admitting: Family Medicine

## 2021-09-23 ENCOUNTER — Encounter: Payer: Self-pay | Admitting: Dermatology

## 2021-09-26 ENCOUNTER — Other Ambulatory Visit: Payer: Self-pay

## 2021-09-26 ENCOUNTER — Ambulatory Visit: Payer: Medicaid Other

## 2021-09-26 DIAGNOSIS — L209 Atopic dermatitis, unspecified: Secondary | ICD-10-CM

## 2021-09-26 MED ORDER — TRALOKINUMAB-LDRM 150 MG/ML ~~LOC~~ SOSY
300.0000 mg | PREFILLED_SYRINGE | Freq: Once | SUBCUTANEOUS | Status: AC
  Administered 2021-09-26: 300 mg via SUBCUTANEOUS

## 2021-09-26 NOTE — Progress Notes (Signed)
Patient here for two week Adbry injections.  Adbry 150mg /mL X2 injected into patient's left upper arm. Patient tolerated well.  LOT: [id number] EXP: 01/2023

## 2021-10-01 ENCOUNTER — Encounter: Payer: Self-pay | Admitting: Podiatry

## 2021-10-02 ENCOUNTER — Ambulatory Visit (INDEPENDENT_AMBULATORY_CARE_PROVIDER_SITE_OTHER): Payer: Medicaid Other | Admitting: Family Medicine

## 2021-10-02 ENCOUNTER — Other Ambulatory Visit: Payer: Self-pay | Admitting: Podiatry

## 2021-10-02 ENCOUNTER — Other Ambulatory Visit: Payer: Self-pay

## 2021-10-02 ENCOUNTER — Encounter: Payer: Self-pay | Admitting: Family Medicine

## 2021-10-02 VITALS — BP 146/72 | HR 99 | Temp 98.3°F | Resp 16 | Ht 64.0 in | Wt 195.0 lb

## 2021-10-02 DIAGNOSIS — M19172 Post-traumatic osteoarthritis, left ankle and foot: Secondary | ICD-10-CM

## 2021-10-02 DIAGNOSIS — K219 Gastro-esophageal reflux disease without esophagitis: Secondary | ICD-10-CM | POA: Diagnosis not present

## 2021-10-02 DIAGNOSIS — M1A09X0 Idiopathic chronic gout, multiple sites, without tophus (tophi): Secondary | ICD-10-CM

## 2021-10-02 DIAGNOSIS — E782 Mixed hyperlipidemia: Secondary | ICD-10-CM

## 2021-10-02 DIAGNOSIS — Z23 Encounter for immunization: Secondary | ICD-10-CM | POA: Diagnosis not present

## 2021-10-02 MED ORDER — HYDROXYZINE HCL 10 MG PO TABS: ORAL_TABLET | ORAL | 1 refills | Status: DC

## 2021-10-02 MED ORDER — ALLOPURINOL 300 MG PO TABS: 300.0000 mg | ORAL_TABLET | Freq: Every day | ORAL | 1 refills | Status: DC

## 2021-10-02 MED ORDER — ATORVASTATIN CALCIUM 20 MG PO TABS: 20.0000 mg | ORAL_TABLET | Freq: Every day | ORAL | 1 refills | Status: DC

## 2021-10-02 NOTE — Progress Notes (Signed)
BP (!) 146/72   Pulse 99   Temp 98.3 F (36.8 C) (Oral)   Resp 16   Ht '5\' 4"'  (1.626 m)   Wt 195 lb (88.5 kg)   SpO2 96%   BMI 33.47 kg/m    Subjective:    Patient ID: KELLER BOUNDS, male    DOB: Jul 13, 1971, 50 y.o.   MRN: [id number]  HPI: TAIVEN GREENLEY is a 50 y.o. male  Chief Complaint  Patient presents with   Hyperlipidemia   Gastroesophageal Reflux   HYPERLIPIDEMIA Hyperlipidemia status: excellent compliance Satisfied with current treatment?  no Side effects:  no Medication compliance: excellent compliance Past cholesterol meds: atorvastatin Supplements: none Aspirin:  no The 10-year ASCVD risk score (Arnett DK, et al., 2019) is: 4.3%   Values used to calculate the score:     Age: 5 years     Sex: Male     Is Non-Hispanic African American: No     Diabetic: No     Tobacco smoker: No     Systolic Blood Pressure: 211 mmHg     Is BP treated: No     HDL Cholesterol: 48 mg/dL     Total Cholesterol: 180 mg/dL Chest pain:  no Coronary artery disease:  no  No gout flares. Tolerating his allopurinol well.   GERD GERD control status: controlled Satisfied with current treatment? yes Medication side effects: no  Medication compliance: excellent Dysphagia: no Odynophagia:  no Hematemesis: no Blood in stool: no  Relevant past medical, surgical, family and social history reviewed and updated as indicated. Interim medical history since our last visit reviewed. Allergies and medications reviewed and updated.  Review of Systems  Constitutional: Negative.   Respiratory: Negative.    Cardiovascular: Negative.   Musculoskeletal:  Positive for arthralgias. Negative for back pain, gait problem, joint swelling, myalgias, neck pain and neck stiffness.  Skin: Negative.   Neurological: Negative.   Psychiatric/Behavioral: Negative.     Per HPI unless specifically indicated above     Objective:    BP (!) 146/72   Pulse 99   Temp 98.3 F (36.8 C) (Oral)    Resp 16   Ht '5\' 4"'  (1.626 m)   Wt 195 lb (88.5 kg)   SpO2 96%   BMI 33.47 kg/m   Wt Readings from Last 3 Encounters:  10/02/21 195 lb (88.5 kg)  08/04/21 194 lb (88 kg)  06/30/21 197 lb 9.6 oz (89.6 kg)    Physical Exam Vitals and nursing note reviewed.  Constitutional:      General: He is not in acute distress.    Appearance: Normal appearance. He is not ill-appearing, toxic-appearing or diaphoretic.  HENT:     Head: Normocephalic and atraumatic.     Right Ear: External ear normal.     Left Ear: External ear normal.     Nose: Nose normal.     Mouth/Throat:     Mouth: Mucous membranes are moist.     Pharynx: Oropharynx is clear.  Eyes:     General: No scleral icterus.       Right eye: No discharge.        Left eye: No discharge.     Extraocular Movements: Extraocular movements intact.     Conjunctiva/sclera: Conjunctivae normal.     Pupils: Pupils are equal, round, and reactive to light.  Cardiovascular:     Rate and Rhythm: Normal rate and regular rhythm.     Pulses: Normal pulses.  Heart sounds: Normal heart sounds. No murmur heard.   No friction rub. No gallop.  Pulmonary:     Effort: Pulmonary effort is normal. No respiratory distress.     Breath sounds: Normal breath sounds. No stridor. No wheezing, rhonchi or rales.  Chest:     Chest wall: No tenderness.  Musculoskeletal:        General: Normal range of motion.     Cervical back: Normal range of motion and neck supple.  Skin:    General: Skin is warm and dry.     Capillary Refill: Capillary refill takes less than 2 seconds.     Coloration: Skin is not jaundiced or pale.     Findings: No bruising, erythema, lesion or rash.  Neurological:     General: No focal deficit present.     Mental Status: He is alert and oriented to person, place, and time. Mental status is at baseline.  Psychiatric:        Mood and Affect: Mood normal.        Behavior: Behavior normal.        Thought Content: Thought content  normal.        Judgment: Judgment normal.    Results for orders placed or performed in visit on 06/16/21  Uric acid  Result Value Ref Range   Uric Acid 4.8 3.8 - 8.4 mg/dL  Comprehensive metabolic panel  Result Value Ref Range   Glucose 114 (H) 65 - 99 mg/dL   BUN 19 6 - 24 mg/dL   Creatinine, Ser 1.10 0.76 - 1.27 mg/dL   eGFR 81 >59 mL/min/1.73   BUN/Creatinine Ratio 17 9 - 20   Sodium 140 134 - 144 mmol/L   Potassium 4.2 3.5 - 5.2 mmol/L   Chloride 103 96 - 106 mmol/L   CO2 22 20 - 29 mmol/L   Calcium 9.3 8.7 - 10.2 mg/dL   Total Protein 7.2 6.0 - 8.5 g/dL   Albumin 4.1 3.8 - 4.9 g/dL   Globulin, Total 3.1 1.5 - 4.5 g/dL   Albumin/Globulin Ratio 1.3 1.2 - 2.2   Bilirubin Total 0.3 0.0 - 1.2 mg/dL   Alkaline Phosphatase 106 44 - 121 IU/L   AST 18 0 - 40 IU/L   ALT 19 0 - 44 IU/L   *Note: Due to a large number of results and/or encounters for the requested time period, some results have not been displayed. A complete set of results can be found in Results Review.      Assessment & Plan:   Problem List Items Addressed This Visit       Digestive   GERD (gastroesophageal reflux disease)    Under good control on current regimen. Continue current regimen. Continue to monitor. Call with any concerns. Refills given. Labs drawn today.      Relevant Orders   Comprehensive metabolic panel   CBC with Differential/Platelet     Other   Hyperlipidemia - Primary    Under good control on current regimen. Continue current regimen. Continue to monitor. Call with any concerns. Refills given. Labs drawn today.       Relevant Medications   atorvastatin (LIPITOR) 20 MG tablet   Other Relevant Orders   Comprehensive metabolic panel   Lipid Panel w/o Chol/HDL Ratio   Chronic gout    Under good control on current regimen. Continue current regimen. Continue to monitor. Call with any concerns. Refills given. Labs drawn today.      Relevant Orders  Comprehensive metabolic panel    Uric acid   Other Visit Diagnoses     Need for influenza vaccination       Relevant Orders   Flu Vaccine QUAD 27moIM (Fluarix, Fluzone & Alfiuria Quad PF) (Completed)   Need for shingles vaccine            Follow up plan: Return in about 3 months (around 01/02/2022).

## 2021-10-02 NOTE — Assessment & Plan Note (Signed)
Under good control on current regimen. Continue current regimen. Continue to monitor. Call with any concerns. Refills given. Labs drawn today.   

## 2021-10-03 LAB — LIPID PANEL W/O CHOL/HDL RATIO
Cholesterol, Total: 164 mg/dL (ref 100–199)
HDL: 46 mg/dL (ref >39)
LDL Chol Calc (NIH): 85 mg/dL (ref 0–99)
Triglycerides: 196 mg/dL — ABNORMAL HIGH (ref 0–149)
VLDL Cholesterol Cal: 33 mg/dL (ref 5–40)

## 2021-10-03 LAB — COMPREHENSIVE METABOLIC PANEL
ALT: 16 IU/L (ref 0–44)
AST: 16 IU/L (ref 0–40)
Albumin/Globulin Ratio: 1.9 (ref 1.2–2.2)
Albumin: 4.8 g/dL (ref 3.8–4.9)
Alkaline Phosphatase: 144 IU/L — ABNORMAL HIGH (ref 44–121)
BUN/Creatinine Ratio: 13 (ref 9–20)
BUN: 16 mg/dL (ref 6–24)
Bilirubin Total: 0.5 mg/dL (ref 0.0–1.2)
CO2: 23 mmol/L (ref 20–29)
Calcium: 9.8 mg/dL (ref 8.7–10.2)
Chloride: 102 mmol/L (ref 96–106)
Creatinine, Ser: 1.25 mg/dL (ref 0.76–1.27)
Globulin, Total: 2.5 g/dL (ref 1.5–4.5)
Glucose: 102 mg/dL — ABNORMAL HIGH (ref 70–99)
Potassium: 4.9 mmol/L (ref 3.5–5.2)
Sodium: 144 mmol/L (ref 134–144)
Total Protein: 7.3 g/dL (ref 6.0–8.5)
eGFR: 70 mL/min/{1.73_m2} (ref >59)

## 2021-10-03 LAB — CBC WITH DIFFERENTIAL/PLATELET
Basophils Absolute: 0.1 10*3/uL (ref 0.0–0.2)
Basos: 1 %
EOS (ABSOLUTE): 0.1 10*3/uL (ref 0.0–0.4)
Eos: 1 %
Hematocrit: 43.5 % (ref 37.5–51.0)
Hemoglobin: 15.1 g/dL (ref 13.0–17.7)
Immature Grans (Abs): 0 10*3/uL (ref 0.0–0.1)
Immature Granulocytes: 0 %
Lymphocytes Absolute: 1.9 10*3/uL (ref 0.7–3.1)
Lymphs: 20 %
MCH: 30.9 pg (ref 26.6–33.0)
MCHC: 34.7 g/dL (ref 31.5–35.7)
MCV: 89 fL (ref 79–97)
Monocytes Absolute: 0.5 10*3/uL (ref 0.1–0.9)
Monocytes: 5 %
Neutrophils Absolute: 7.1 10*3/uL — ABNORMAL HIGH (ref 1.4–7.0)
Neutrophils: 73 %
Platelets: 250 10*3/uL (ref 150–450)
RBC: 4.88 x10E6/uL (ref 4.14–5.80)
RDW: 12.8 % (ref 11.6–15.4)
WBC: 9.6 10*3/uL (ref 3.4–10.8)

## 2021-10-03 LAB — URIC ACID: Uric Acid: 5 mg/dL (ref 3.8–8.4)

## 2021-10-04 ENCOUNTER — Encounter: Payer: Self-pay | Admitting: Podiatry

## 2021-10-05 ENCOUNTER — Ambulatory Visit: Payer: Medicaid Other | Admitting: Podiatry

## 2021-10-09 ENCOUNTER — Encounter: Payer: Self-pay | Admitting: Dermatology

## 2021-10-10 ENCOUNTER — Encounter: Payer: Self-pay | Admitting: Dermatology

## 2021-10-10 ENCOUNTER — Ambulatory Visit: Payer: Medicaid Other | Admitting: Dermatology

## 2021-10-10 ENCOUNTER — Other Ambulatory Visit: Payer: Self-pay

## 2021-10-10 DIAGNOSIS — L209 Atopic dermatitis, unspecified: Secondary | ICD-10-CM | POA: Diagnosis not present

## 2021-10-10 DIAGNOSIS — L239 Allergic contact dermatitis, unspecified cause: Secondary | ICD-10-CM | POA: Diagnosis not present

## 2021-10-10 MED ORDER — CLOBETASOL PROP EMOLLIENT BASE 0.05 % EX CREA: TOPICAL_CREAM | CUTANEOUS | 1 refills | Status: DC

## 2021-10-10 MED ORDER — TRALOKINUMAB-LDRM 150 MG/ML ~~LOC~~ SOSY
300.0000 mg | PREFILLED_SYRINGE | Freq: Once | SUBCUTANEOUS | Status: AC
  Administered 2021-10-10: 300 mg via SUBCUTANEOUS

## 2021-10-10 MED ORDER — KETOCONAZOLE 2 % EX SHAM: MEDICATED_SHAMPOO | CUTANEOUS | 0 refills | Status: DC

## 2021-10-10 NOTE — Progress Notes (Signed)
   Follow-Up Visit   Subjective  Jesus Guerrero is a 50 y.o. male who presents for the following: Follow-up (Pt here for 2 week Adbry injection. He also complains today of itching at the scalp and itching on the back. States that scalp has been itching for a while and back for approx 2 weeks. Pt treating both areas with Elidel with no relief. ).  The following portions of the chart were reviewed this encounter and updated as appropriate:  Tobacco  Allergies  Meds  Problems  Med Hx  Surg Hx  Fam Hx      Review of Systems: No other skin or systemic complaints except as noted in HPI or Assessment and Plan.   Objective  Well appearing patient in no apparent distress; mood and affect are within normal limits.  A focused examination was performed including back, hands, flanks and scalp. Relevant physical exam findings are noted in the Assessment and Plan.  Left Flank Very thin scaly erythematous plaques at hands, nearly clear; faintly erythematous patches at scalp and left flank  Assessment & Plan  Atopic dermatitis, unspecified type Left Flank  Chronic condition with duration over one year. Condition is bothersome to patient. Not currently at goal.  Atopic dermatitis (eczema) is a chronic, relapsing, pruritic condition that can significantly affect quality of life. It is often associated with allergic rhinitis and/or asthma and can require treatment with topical medications, phototherapy, or in severe cases a biologic medication called Dupixent in children and adults.   Recommend OTC Gold Bond Rapid Relief Anti-Itch cream (pramoxine + menthol), CeraVe Anti-itch cream or lotion (pramoxine), Sarna lotion (Original- menthol + camphor or Sensitive- pramoxine) or Eucerin 12 hour Itch Relief lotion (menthol) up to 3 times per day to areas on body that are itchy.  If not improving may consider switching Adbry to Rinvoq.   Start Elidel and clobetasol BID for two weeks to affected areas.  Avoid applying clobetasol to face, groin, and axilla. Use as directed. Long-term use can cause thinning of the skin.   300 mg Adbry injected into right upper arm today. Pt tolerated well.  Lot: [id number] Exp: 01/2023     Clobetasol Prop Emollient Base 0.05 % emollient cream - Left Flank Apply twice a day to affected areas itch for two weeks  Related Medications Tralokinumab-ldrm (ADBRY) 150 MG/ML SOSY Inject 2 mLs (300 mg total) into the skin every 14 (fourteen) days. Starting on day 15 for maintenance.  mupirocin ointment (BACTROBAN) 2 % APPLY TO OPEN AREAS DAILY AS NEEDED. KEEP COVERED WITH BANDAGE  Tralokinumab-ldrm SOSY 300 mg   Allergic contact dermatitis, unspecified trigger  Related Medications ketoconazole (NIZORAL) 2 % shampoo SHAMPOO WITH A SMALL AMOUNT TOPICALLY ONCE DAILY  Return in about 2 weeks (around 10/24/2021) for AD f/u, possible Adbry injection.  I, Harriett Sine, CMA, am acting as scribe for [name], MD.  Documentation: I have reviewed the above documentation for accuracy and completeness, and I agree with the above.  [name], MD

## 2021-10-10 NOTE — Patient Instructions (Signed)
Recommend OTC Gold Bond Rapid Relief Anti-Itch cream (pramoxine + menthol), CeraVe Anti-itch cream or lotion (pramoxine), Sarna lotion (Original- menthol + camphor or Sensitive- pramoxine) or Eucerin 12 hour Itch Relief lotion (menthol) up to 3 times per day to areas on body that are itchy.  If not improving may consider switching Adbry to Rinvoc.   Continue Elidel and clobetasol BID for two weeks to affected areas.

## 2021-10-16 ENCOUNTER — Encounter: Payer: Self-pay | Admitting: Family Medicine

## 2021-10-16 ENCOUNTER — Other Ambulatory Visit: Payer: Self-pay | Admitting: Family Medicine

## 2021-10-16 NOTE — Telephone Encounter (Signed)
Requested medications are due for refill today.  yes  Requested medications are on the active medications list.  yes  Last refill. 06/28/2021  Future visit scheduled.   yes  Notes to clinic.  Medication not delegated.

## 2021-10-19 ENCOUNTER — Other Ambulatory Visit: Payer: Self-pay | Admitting: Dermatology

## 2021-10-19 ENCOUNTER — Encounter: Payer: Self-pay | Admitting: Dermatology

## 2021-10-19 ENCOUNTER — Other Ambulatory Visit: Payer: Self-pay | Admitting: Family Medicine

## 2021-10-19 MED ORDER — CYCLOBENZAPRINE HCL 10 MG PO TABS: 10.0000 mg | ORAL_TABLET | Freq: Every day | ORAL | 3 refills | Status: DC

## 2021-10-23 ENCOUNTER — Other Ambulatory Visit: Payer: Self-pay

## 2021-10-23 ENCOUNTER — Ambulatory Visit
Admission: RE | Admit: 2021-10-23 | Discharge: 2021-10-23 | Disposition: A | Discharge status: Home / Self Care | Payer: Medicaid Other | Source: Ambulatory Visit | Attending: Podiatry | Admitting: Podiatry

## 2021-10-23 DIAGNOSIS — M19172 Post-traumatic osteoarthritis, left ankle and foot: Secondary | ICD-10-CM | POA: Diagnosis not present

## 2021-10-24 ENCOUNTER — Encounter: Payer: Self-pay | Admitting: Dermatology

## 2021-10-25 ENCOUNTER — Ambulatory Visit: Payer: Medicaid Other | Admitting: Dermatology

## 2021-10-25 ENCOUNTER — Telehealth: Payer: Self-pay | Admitting: *Deleted

## 2021-10-25 NOTE — Telephone Encounter (Signed)
"  I'm calling regarding Jesus Guerrero.  It's in regards to a CT of the left ankle without Contrast that Dr. Posey Pronto ordered on this patient.  It was done at [facility].  If someone could give [place] a call back for that report results."

## 2021-10-25 NOTE — Telephone Encounter (Signed)
"  I'm calling to see if you are aware of the report for Jesus Guerrero.  It's a CT scan of the left ankle.  Please give [place] a call and verify that you are in possession of the report or if you still need report results."

## 2021-10-27 ENCOUNTER — Encounter: Payer: Self-pay | Admitting: Family Medicine

## 2021-10-27 NOTE — Telephone Encounter (Signed)
Lmom asking for pt to call back to schedule an appt

## 2021-10-29 ENCOUNTER — Encounter: Payer: Self-pay | Admitting: Dermatology

## 2021-10-30 ENCOUNTER — Encounter: Payer: Self-pay | Admitting: Family Medicine

## 2021-11-01 ENCOUNTER — Ambulatory Visit: Payer: Medicaid Other | Admitting: Dermatology

## 2021-11-02 ENCOUNTER — Other Ambulatory Visit: Payer: Self-pay

## 2021-11-02 DIAGNOSIS — L209 Atopic dermatitis, unspecified: Secondary | ICD-10-CM

## 2021-11-02 MED ORDER — ADBRY 150 MG/ML ~~LOC~~ SOSY: 300.0000 mg | PREFILLED_SYRINGE | SUBCUTANEOUS | 1 refills | Status: DC

## 2021-11-02 NOTE — Progress Notes (Signed)
Adbry refill request.

## 2021-11-06 ENCOUNTER — Encounter: Payer: Self-pay | Admitting: Dermatology

## 2021-11-07 ENCOUNTER — Encounter: Payer: Self-pay | Admitting: Dermatology

## 2021-11-08 ENCOUNTER — Ambulatory Visit: Payer: Medicaid Other | Admitting: Dermatology

## 2021-11-08 ENCOUNTER — Encounter: Payer: Self-pay | Admitting: Dermatology
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
## 2021-11-09 ENCOUNTER — Ambulatory Visit: Payer: Self-pay | Admitting: Urology

## 2021-11-09 DIAGNOSIS — R319 Hematuria, unspecified: Secondary | ICD-10-CM

## 2021-11-10 ENCOUNTER — Encounter: Payer: Self-pay | Admitting: Urology

## 2021-11-11 ENCOUNTER — Encounter: Payer: Self-pay | Admitting: Podiatry

## 2021-11-14 ENCOUNTER — Encounter: Payer: Self-pay | Admitting: Dermatology

## 2021-11-14 ENCOUNTER — Other Ambulatory Visit: Payer: Self-pay

## 2021-11-14 ENCOUNTER — Ambulatory Visit: Payer: Medicaid Other | Admitting: Dermatology

## 2021-11-14 DIAGNOSIS — L299 Pruritus, unspecified: Secondary | ICD-10-CM

## 2021-11-14 DIAGNOSIS — L239 Allergic contact dermatitis, unspecified cause: Secondary | ICD-10-CM

## 2021-11-14 DIAGNOSIS — L2089 Other atopic dermatitis: Secondary | ICD-10-CM | POA: Diagnosis not present

## 2021-11-14 MED ORDER — TRALOKINUMAB-LDRM 150 MG/ML ~~LOC~~ SOSY
300.0000 mg | PREFILLED_SYRINGE | Freq: Once | SUBCUTANEOUS | Status: AC
  Administered 2021-11-14: 300 mg via SUBCUTANEOUS

## 2021-11-14 MED ORDER — KETOCONAZOLE 2 % EX SHAM: MEDICATED_SHAMPOO | CUTANEOUS | 5 refills | Status: DC

## 2021-11-14 MED ORDER — PIMECROLIMUS 1 % EX CREA: TOPICAL_CREAM | Freq: Two times a day (BID) | CUTANEOUS | 0 refills | Status: DC

## 2021-11-14 MED ORDER — EUCRISA 2 % EX OINT: TOPICAL_OINTMENT | CUTANEOUS | 5 refills | Status: DC

## 2021-11-14 NOTE — Progress Notes (Signed)
   Follow-Up Visit   Subjective  Jesus Guerrero is a 50 y.o. male who presents for the following: Follow-up (Patient here today for dermatitis follow up. Patient currently using clobetasol at scalp and back for itch. Patient had Adbry injections last month. ).   The following portions of the chart were reviewed this encounter and updated as appropriate:   Tobacco  Allergies  Meds  Problems  Med Hx  Surg Hx  Fam Hx      Review of Systems:  No other skin or systemic complaints except as noted in HPI or Assessment and Plan.  Objective  Well appearing patient in no apparent distress; mood and affect are within normal limits.  A focused examination was performed including scalp, arms, back. Relevant physical exam findings are noted in the Assessment and Plan.  Right Lower Back Thin faintly scaly pink plaques at dorsal hands  Scalp, back, side Excoriations    Assessment & Plan  Other atopic dermatitis Right Lower Back  Chronic condition with duration over one year. Bothersome for patient. Not currently at goal.   Restart Elidel and Eucrisa daily as needed (pt ran out). D/c clobetasol. Continue Adbry every 2 weeks Adbry 300mg /mL injected into left arm. Patient tolerated well.   Patient's primary concern is recalcitrant itch at scalp, back, flank where there usually is no visible skin change. Unclear if itch related to atopic dermatitis or neurogenic pruritus. Will do trial of low dose naltrexone for itch and if itch not resolved, consider d/c Adbry and start Rinvoq.   pimecrolimus (ELIDEL) 1 % cream - Right Lower Back Apply topically 2 (two) times daily.  Related Medications Crisaborole (EUCRISA) 2 % OINT Apply to hands 1-2 times daily as needed  Tralokinumab-ldrm SOSY 300 mg   Itch Scalp, back, side  Chronic condition with duration or expected duration over one year. Condition is bothersome to patient. Not currently at goal.  Will plan low dose naltrexone. Due  to lack of availability of lower doses without compounding (not covered by insurance), will have patient take 1/4 of 50 mg tablet every other day to start and may increase to 1/4 tablet daily. Patient denies opiate use. Patient is in agreement.   Start mupirocin to open areas daily and cover.   Allergic contact dermatitis, unspecified trigger  Related Medications ketoconazole (NIZORAL) 2 % shampoo SHAMPOO WITH A SMALL AMOUNT TOPICALLY ONCE DAILY  Return in about 2 weeks (around 11/28/2021) for Adbry injections with nurse, 1 month with Dr. Laurence Ferrari.  [name], RMA, am acting as scribe for [name], MD .  Documentation: I have reviewed the above documentation for accuracy and completeness, and I agree with the above.  [name], MD

## 2021-11-14 NOTE — Patient Instructions (Addendum)
If you have any questions or concerns for your doctor, please call our main line at [phone] and press option 4 to reach your doctor's medical assistant. If no one answers, please leave a voicemail as directed and we will return your call as soon as possible. Messages left after 4 pm will be answered the following business day.   You may also send [place] a message via [facility]. We typically respond to MyChart messages within 1-2 business days.  For prescription refills, please ask your pharmacy to contact our office. Our fax number is [phone].  If you have an urgent issue when the clinic is closed that cannot wait until the next business day, you can page your doctor at the number below.    Please note that while we do our best to be available for urgent issues outside of office hours, we are not available 24/7.   If you have an urgent issue and are unable to reach [place], you may choose to seek medical care at your doctor's office, retail clinic, urgent care center, or emergency room.  If you have a medical emergency, please immediately call 911 or go to the emergency department.  Pager Numbers  - Dr. Nehemiah Massed: [phone]  - Dr. Laurence Ferrari: [phone]  - Dr. Nicole Kindred: [phone]  In the event of inclement weather, please call our main line at [phone] for an update on the status of any delays or closures.  Dermatology Medication Tips: Please keep the boxes that topical medications come in in order to help keep track of the instructions about where and how to use these. Pharmacies typically print the medication instructions only on the boxes and not directly on the medication tubes.   If your medication is too expensive, please contact our office at [phone] option 4 or send [place] a message through [facility].   We are unable to tell what your co-pay for medications will be in advance as this is different depending on your insurance coverage. However, we may be able to find a substitute  medication at lower cost or fill out paperwork to get insurance to cover a needed medication.   If a prior authorization is required to get your medication covered by your insurance company, please allow [place] 1-2 business days to complete this process.  Drug prices often vary depending on where the prescription is filled and some pharmacies may offer cheaper prices.  The website www.goodrx.com contains coupons for medications through different pharmacies. The prices here do not account for what the cost may be with help from insurance (it may be cheaper with your insurance), but the website can give you the price if you did not use any insurance.  - You can print the associated coupon and take it with your prescription to the pharmacy.  - You may also stop by our office during regular business hours and pick up a GoodRx coupon card.  - If you need your prescription sent electronically to a different pharmacy, notify our office through [facility] or by phone at [phone] option 4.  Recommend over the counter ammonium lactate to your feet 1-2 times daily.

## 2021-11-15 ENCOUNTER — Encounter: Payer: Self-pay | Admitting: Dermatology

## 2021-11-16 NOTE — Telephone Encounter (Signed)
Called patient to schedule an appointment No answer LM on VM for pt to call the office so we can get him scheduled.

## 2021-11-17 ENCOUNTER — Encounter: Payer: Self-pay | Admitting: Family Medicine

## 2021-11-20 NOTE — Telephone Encounter (Signed)
appt

## 2021-11-20 NOTE — Telephone Encounter (Signed)
Pt only wanted to discuss medication and declined appointment at this time.

## 2021-11-22 ENCOUNTER — Encounter: Payer: Self-pay | Admitting: Dermatology

## 2021-11-22 ENCOUNTER — Encounter: Payer: Self-pay | Admitting: Family Medicine

## 2021-11-25 ENCOUNTER — Encounter: Payer: Self-pay | Admitting: Family Medicine

## 2021-11-25 ENCOUNTER — Encounter: Payer: Self-pay | Admitting: Dermatology

## 2021-11-27 NOTE — Telephone Encounter (Signed)
appt

## 2021-11-27 NOTE — Telephone Encounter (Signed)
Lmom for patient to schedule appointment.

## 2021-11-28 ENCOUNTER — Ambulatory Visit: Payer: Medicaid Other

## 2021-11-28 ENCOUNTER — Other Ambulatory Visit: Payer: Self-pay

## 2021-11-28 DIAGNOSIS — L209 Atopic dermatitis, unspecified: Secondary | ICD-10-CM | POA: Diagnosis not present

## 2021-11-28 MED ORDER — NALTREXONE HCL 50 MG PO TABS: ORAL_TABLET | ORAL | 1 refills | Status: DC

## 2021-11-28 MED ORDER — DOXYCYCLINE HYCLATE 100 MG PO TABS: 100.0000 mg | ORAL_TABLET | Freq: Two times a day (BID) | ORAL | 0 refills | Status: AC

## 2021-11-28 MED ORDER — TRALOKINUMAB-LDRM 150 MG/ML ~~LOC~~ SOSY
300.0000 mg | PREFILLED_SYRINGE | Freq: Once | SUBCUTANEOUS | Status: AC
  Administered 2021-11-28: 300 mg via SUBCUTANEOUS

## 2021-11-28 NOTE — Patient Instructions (Signed)
Start Doxycycline 100mg  twice a day for 7 days.  Doxycycline should be taken with food to prevent nausea. Do not lay down for 30 minutes after taking. Be cautious with sun exposure and use good sun protection while on this medication.   Continue Mupirocin ointment. You may also apply a warm compress to area.   Keep follow up appointment with Dr. Laurence Ferrari in 3 weeks.

## 2021-11-28 NOTE — Progress Notes (Signed)
Patient here for two week Adbry injection. Adbry 150mg /mL X2 injected into right upper arm. Patient tolerated well.  LOT: [id number] EXP: 01/2023  Patient also had spot on stomach reviewed. Photo taken and shown to Dr. Nicole Kindred. Per Dr. Nicole Kindred patient to start Doxycycline 100mg  BID for 7 days. Patient advised to take with full meal and plenty of water. Counseled on possible side effects. Patient also advised to continue mupirocin and may apply warm compresses. Photo taken of area today.  Patient is scheduled to follow up with Dr. Laurence Ferrari in three weeks.

## 2021-11-30 ENCOUNTER — Encounter: Payer: Self-pay | Admitting: Family Medicine

## 2021-11-30 ENCOUNTER — Encounter: Payer: Self-pay | Admitting: Dermatology

## 2021-11-30 DIAGNOSIS — L700 Acne vulgaris: Secondary | ICD-10-CM

## 2021-12-01 MED ORDER — CLINDAMYCIN PHOS-BENZOYL PEROX 1.2-5 % EX GEL: CUTANEOUS | 2 refills | Status: DC

## 2021-12-01 NOTE — Telephone Encounter (Signed)
Lmom asking pt to call back to schedule an appt. °

## 2021-12-03 ENCOUNTER — Encounter: Payer: Self-pay | Admitting: Dermatology

## 2021-12-04 ENCOUNTER — Encounter: Payer: Self-pay | Admitting: Family Medicine

## 2021-12-04 ENCOUNTER — Encounter: Payer: Self-pay | Admitting: Podiatry

## 2021-12-04 NOTE — Telephone Encounter (Signed)
3rd attempt- Lmom for patient to schedule appointment.

## 2021-12-05 ENCOUNTER — Encounter: Payer: Self-pay | Admitting: Dermatology

## 2021-12-07 ENCOUNTER — Telehealth: Payer: Self-pay

## 2021-12-07 NOTE — Telephone Encounter (Signed)
Left message on voicemail to return my call. Can patient come in today at 12:30 pm to been seen by Dr. Laurence Ferrari for a rash on his back.

## 2021-12-10 ENCOUNTER — Other Ambulatory Visit: Payer: Self-pay | Admitting: Family Medicine

## 2021-12-10 NOTE — Telephone Encounter (Signed)
Requested Prescriptions  Pending Prescriptions Disp Refills  . cetirizine (ZYRTEC) 10 MG tablet [Pharmacy Med Name: Cetirizine HCl 10 MG Oral Tablet] 90 tablet 0    Sig: Take 1 tablet by mouth once daily     Ear, Nose, and Throat:  Antihistamines Passed - 12/10/2021 11:11 AM      Passed - Valid encounter within last 12 months    Recent Outpatient Visits          2 months ago Mixed hyperlipidemia   [place], Megan P, DO   4 months ago Bilateral foot pain   [place], [place], DO   5 months ago [place], Megan P, DO   5 months ago Acute idiopathic gout involving toe of right foot   [place], [place], DO   6 months ago Pruritus   [place], Barb Merino, DO      Future Appointments            In 1 week Laurence Ferrari, [place], MD [place]   In 3 weeks Wynetta Emery, Barb Merino, DO [facility], [place]

## 2021-12-11 ENCOUNTER — Other Ambulatory Visit: Payer: Self-pay | Admitting: Family Medicine

## 2021-12-11 NOTE — Telephone Encounter (Signed)
Duplicate request- Rx sent to pharmacy 12/10/21 Requested Prescriptions  Pending Prescriptions Disp Refills  . cetirizine (ZYRTEC) 10 MG tablet [Pharmacy Med Name: Cetirizine HCl 10 MG Oral Tablet] 90 tablet 0    Sig: Take 1 tablet by mouth once daily     Ear, Nose, and Throat:  Antihistamines Passed - 12/11/2021 10:57 AM      Passed - Valid encounter within last 12 months    Recent Outpatient Visits          2 months ago Mixed hyperlipidemia   [place], Megan P, DO   4 months ago Bilateral foot pain   [place], [place], DO   5 months ago [place], Megan P, DO   5 months ago Acute idiopathic gout involving toe of right foot   [place], [place], DO   6 months ago Pruritus   [place], Barb Merino, DO      Future Appointments            In 2 days [facility], [place], MD [place]   In 1 week [place], [place], MD [place]   In 3 weeks [place], Barb Merino, DO [facility]

## 2021-12-13 ENCOUNTER — Ambulatory Visit: Payer: Medicaid Other | Admitting: Dermatology

## 2021-12-14 ENCOUNTER — Telehealth: Payer: Self-pay

## 2021-12-14 NOTE — Telephone Encounter (Signed)
Please advise 

## 2021-12-14 NOTE — Telephone Encounter (Signed)
LMOM to call back to schedule appointment with Dr Posey Pronto   Rt Ankle pain and swollen

## 2021-12-15 ENCOUNTER — Telehealth: Payer: Self-pay

## 2021-12-15 NOTE — Telephone Encounter (Signed)
I left a message on patient's voicemail to return my call. Dr. Moye will be out of the office next week and we need to reschedule her appointment.  °

## 2021-12-16 ENCOUNTER — Encounter: Payer: Self-pay | Admitting: Dermatology

## 2021-12-17 ENCOUNTER — Encounter: Payer: Self-pay | Admitting: Dermatology

## 2021-12-18 ENCOUNTER — Encounter: Payer: Self-pay | Admitting: Dermatology

## 2021-12-18 ENCOUNTER — Ambulatory Visit: Payer: Medicaid Other | Admitting: Dermatology

## 2021-12-18 ENCOUNTER — Other Ambulatory Visit: Payer: Self-pay

## 2021-12-18 DIAGNOSIS — L209 Atopic dermatitis, unspecified: Secondary | ICD-10-CM

## 2021-12-18 MED ORDER — ELIDEL 1 % EX CREA: TOPICAL_CREAM | Freq: Two times a day (BID) | CUTANEOUS | 2 refills | Status: DC

## 2021-12-18 MED ORDER — CLOBETASOL PROPIONATE 0.05 % EX OINT: TOPICAL_OINTMENT | CUTANEOUS | 0 refills | Status: DC

## 2021-12-19 NOTE — Telephone Encounter (Signed)
Lmom to call back to schedule follow up appt with Dr Posey Pronto

## 2021-12-21 ENCOUNTER — Encounter: Payer: Self-pay | Admitting: Dermatology

## 2021-12-26 ENCOUNTER — Encounter: Payer: Self-pay | Admitting: Family Medicine

## 2021-12-27 ENCOUNTER — Other Ambulatory Visit: Payer: Self-pay

## 2021-12-27 ENCOUNTER — Encounter: Payer: Self-pay | Admitting: Dermatology

## 2021-12-27 ENCOUNTER — Ambulatory Visit: Payer: Medicaid Other | Admitting: Dermatology

## 2021-12-27 DIAGNOSIS — D234 Other benign neoplasm of skin of scalp and neck: Secondary | ICD-10-CM | POA: Diagnosis not present

## 2021-12-27 DIAGNOSIS — L209 Atopic dermatitis, unspecified: Secondary | ICD-10-CM | POA: Diagnosis not present

## 2021-12-27 DIAGNOSIS — L299 Pruritus, unspecified: Secondary | ICD-10-CM

## 2021-12-27 DIAGNOSIS — L308 Other specified dermatitis: Secondary | ICD-10-CM

## 2021-12-27 MED ORDER — ADBRY 150 MG/ML ~~LOC~~ SOSY: 300.0000 mg | PREFILLED_SYRINGE | SUBCUTANEOUS | 5 refills | Status: DC

## 2021-12-27 MED ORDER — ELIDEL 1 % EX CREA: TOPICAL_CREAM | Freq: Two times a day (BID) | CUTANEOUS | 2 refills | Status: DC

## 2021-12-27 MED ORDER — TRALOKINUMAB-LDRM 150 MG/ML ~~LOC~~ SOSY
300.0000 mg | PREFILLED_SYRINGE | Freq: Once | SUBCUTANEOUS | Status: AC
  Administered 2021-12-27: 11:00:00 300 mg via SUBCUTANEOUS

## 2021-12-27 NOTE — Patient Instructions (Addendum)
Recommend only Dove soap with lukewarm water and use heavy hand cream every time you wash your hands. Try to use hand sanitizer.   Increase naltrexone to 1/4 tablet every day.   Restart Elidel twice daily to hands and other areas of eczema.   You can use clobetasol cream twice a day to hands for up to 1 week then switch to Elidel. Avoid applying to face, groin, and axilla. Use as directed. Long-term use can cause thinning of the skin.  Topical steroids (such as triamcinolone, fluocinolone, fluocinonide, mometasone, clobetasol, halobetasol, betamethasone, hydrocortisone) can cause thinning and lightening of the skin if they are used for too long in the same area. Your physician has selected the right strength medicine for your problem and area affected on the body. Please use your medication only as directed by your physician to prevent side effects.    If You Need Anything After Your Visit  If you have any questions or concerns for your doctor, please call our main line at [phone] and press option 4 to reach your doctor's medical assistant. If no one answers, please leave a voicemail as directed and we will return your call as soon as possible. Messages left after 4 pm will be answered the following business day.   You may also send [place] a message via [place]. We typically respond to MyChart messages within 1-2 business days.  For prescription refills, please ask your pharmacy to contact our office. Our fax number is [phone].  If you have an urgent issue when the clinic is closed that cannot wait until the next business day, you can page your doctor at the number below.    Please note that while we do our best to be available for urgent issues outside of office hours, we are not available 24/7.   If you have an urgent issue and are unable to reach [place], you may choose to seek medical care at your doctor's office, retail clinic, urgent care center, or emergency room.  If you have a  medical emergency, please immediately call 911 or go to the emergency department.  Pager Numbers  - Dr. Nehemiah Massed: [phone]  - Dr. Laurence Ferrari: [phone]  - Dr. Nicole Kindred: [phone]  In the event of inclement weather, please call our main line at [phone] for an update on the status of any delays or closures.  Dermatology Medication Tips: Please keep the boxes that topical medications come in in order to help keep track of the instructions about where and how to use these. Pharmacies typically print the medication instructions only on the boxes and not directly on the medication tubes.   If your medication is too expensive, please contact our office at [phone] option 4 or send [place] a message through [place].   We are unable to tell what your co-pay for medications will be in advance as this is different depending on your insurance coverage. However, we may be able to find a substitute medication at lower cost or fill out paperwork to get insurance to cover a needed medication.   If a prior authorization is required to get your medication covered by your insurance company, please allow [place] 1-2 business days to complete this process.  Drug prices often vary depending on where the prescription is filled and some pharmacies may offer cheaper prices.  The website www.goodrx.com contains coupons for medications through different pharmacies. The prices here do not account for what the cost may be with help from insurance (it may be cheaper  with your insurance), but the website can give you the price if you did not use any insurance.  - You can print the associated coupon and take it with your prescription to the pharmacy.  - You may also stop by our office during regular business hours and pick up a GoodRx coupon card.  - If you need your prescription sent electronically to a different pharmacy, notify our office through [facility] or by phone at [phone] option 4.     Si  Usted Necesita Algo Despus de Su Visita  Tambin puede enviarnos un mensaje a travs de Pharmacist, community. Por lo general respondemos a los mensajes de MyChart en el transcurso de 1 a 2 das hbiles.  Para renovar recetas, por favor pida a su farmacia que se ponga en contacto con nuestra oficina. Harland Dingwall de fax es [place] [phone].  Si tiene un asunto urgente cuando la clnica est cerrada y que no puede esperar hasta el siguiente da hbil, puede llamar/localizar a su doctor(a) al nmero que aparece a continuacin.   Por favor, tenga en cuenta que aunque hacemos todo lo posible para estar disponibles para asuntos urgentes fuera del horario de [place], no estamos disponibles las 24 horas del da, los 7 das de la [place].   Si tiene un problema urgente y no puede comunicarse con nosotros, puede optar por buscar atencin mdica  en el consultorio de su doctor(a), en una clnica privada, en un centro de atencin urgente o en una sala de emergencias.  Si tiene Engineering geologist, por favor llame inmediatamente al 911 o vaya a la sala de emergencias.  Nmeros de bper  - Dr. Nehemiah Massed: [phone]  - Dra. Moye: [phone]  - Dra. Nicole Kindred: [phone]  En caso de inclemencias del [place], por favor llame a Johnsie Kindred principal al [phone] para una actualizacin sobre el [place] de cualquier retraso o cierre.  Consejos para la medicacin en dermatologa: Por favor, guarde las cajas en las que vienen los medicamentos de uso tpico para ayudarle a seguir las instrucciones sobre dnde y cmo usarlos. Las farmacias generalmente imprimen las instrucciones del medicamento slo en las cajas y no directamente en los tubos del [place].   Si su medicamento es muy caro, por favor, pngase en contacto con Zigmund Daniel llamando al [phone] y presione la opcin 4 o envenos un mensaje a travs de Pharmacist, community.   No podemos decirle cul ser su copago por los medicamentos por adelantado ya que  esto es diferente dependiendo de la cobertura de su seguro. Sin embargo, es posible que podamos encontrar un medicamento sustituto a Electrical engineer un formulario para que el seguro cubra el medicamento que se considera necesario.   Si se requiere una autorizacin previa para que su compaa de seguros [place] su medicamento, por favor permtanos de 1 a 2 das hbiles para completar este proceso.  Los precios de los medicamentos varan con frecuencia dependiendo del Environmental consultant de dnde se surte la receta y alguna farmacias pueden ofrecer precios ms baratos.  El sitio web www.goodrx.com tiene cupones para medicamentos de Airline pilot. Los precios aqu no tienen en cuenta lo que podra costar con la ayuda del seguro (puede ser ms barato con su seguro), pero el sitio web puede darle el precio si no utiliz Research scientist (physical sciences).  - Puede imprimir el cupn correspondiente y llevarlo con su receta a la farmacia.  - Tambin puede pasar por nuestra oficina durante el horario de atencin regular y Corporate treasurer  tarjeta de cupones de GoodRx.  - Si necesita que su receta se enve electrnicamente a una farmacia diferente, informe a nuestra oficina a travs de MyChart de [place] o por telfono llamando al [phone] y presione la opcin 4.

## 2021-12-27 NOTE — Progress Notes (Signed)
Follow-Up Visit   Subjective  Jesus Guerrero is a 50 y.o. male who presents for the following: Follow-up (Patient here today for atopic dermatitis. He is currently on Adbry, using clobetasol cream at scalp and back. He is having itching at scalp, mostly after having a hair cut. Patient's hands do have some cracks on them and he is using mupirocin. ) and Rash (Patient also with a rash at right buttock. He is only using mupirocin there. ).   The following portions of the chart were reviewed this encounter and updated as appropriate:   Tobacco   Allergies   Meds   Problems   Med Hx   Surg Hx   Fam Hx       Review of Systems:  No other skin or systemic complaints except as noted in HPI or Assessment and Plan.  Objective  Well appearing patient in no apparent distress; mood and affect are within normal limits.  A focused examination was performed including scalp, flanks, hands, arms. Relevant physical exam findings are noted in the Assessment and Plan.  hands, back Scaly pink plaques with fissures at hands Back with excoriated pink papules Right buttock with scaly pink papules coalescing to plaques   Scalp Firm lichenified plaque with central scar without features suspicious for malignancy on dermoscopy     Assessment & Plan  Other eczema hands, back  Chronic condition with duration or expected duration over one year. Condition is bothersome to patient. Currently flared.  Restart Adbry 300 mg/mL every 2 weeks. Restart Elidel twice daily to hands and other areas of eczema May use clobetasol cream twice a day as needed up to one week for more severe areas at hands. Avoid applying to face, groin, and axilla. Use as directed. Long-term use can cause thinning of the skin.  Topical steroids (such as triamcinolone, fluocinolone, fluocinonide, mometasone, clobetasol, halobetasol, betamethasone, hydrocortisone) can cause thinning and lightening of the skin if they are used for too  long in the same area. Your physician has selected the right strength medicine for your problem and area affected on the body. Please use your medication only as directed by your physician to prevent side effects.   Adbry 300 mg/mL injected to patient's left upper arm. Patient tolerated well. Lot # [id number] Exp: 01/2023  If not doing well on Adbry plus elidel plus naltrexone (for itch), consider switch to Rinvoq  Related Medications Tralokinumab-ldrm SOSY 300 mg   Other benign neoplasm of skin of scalp and neck Scalp  C/w prurigo nodule  Previous biopsy showed EPIDERMAL HYPERPLASIA WITH FIBROSIS  Prior to procedure, discussed risks of blister formation, small wound, skin dyspigmentation, or rare scar following cryotherapy. Recommend Vaseline ointment to treated areas while healing.  Prior to procedure, discussed risks of blister formation, small wound, skin dyspigmentation, or rare scar following cryotherapy. Recommend Vaseline ointment to treated areas while healing.   Destruction of lesion - Scalp  Destruction method: cryotherapy   Informed consent: discussed and consent obtained   Lesion destroyed using liquid nitrogen: Yes   Cryotherapy cycles:  2 Outcome: patient tolerated procedure well with no complications   Post-procedure details: wound care instructions given    Atopic dermatitis, unspecified type  Related Medications mupirocin ointment (BACTROBAN) 2 % APPLY TO OPEN AREAS DAILY AS NEEDED. KEEP COVERED WITH BANDAGE  Clobetasol Prop Emollient Base 0.05 % emollient cream Apply twice a day to affected areas itch for two weeks  ELIDEL 1 % cream Apply topically 2 (two) times  daily.  Tralokinumab-ldrm (ADBRY) 150 MG/ML SOSY Inject 2 mLs (300 mg total) into the skin every 14 (fourteen) days.  Pruritus Scalp  Chronic condition with duration or expected duration over one year. Condition is bothersome to patient. Not currently at goal.  Improved on naltrexone but  still significantly bothersome. Tolerating naltrexone well without SEs.  Increase naltrexone to 1/4 of 50 mg tablet every day.    Return in about 2 weeks (around 01/10/2022) for Adbry injections with nurse, 1 month with Dr. Laurence Ferrari.  [name], RMA, am acting as scribe for [name], MD . Documentation: I have reviewed the above documentation for accuracy and completeness, and I agree with the above.  [name], MD

## 2021-12-28 ENCOUNTER — Telehealth: Payer: Self-pay | Admitting: Dermatology

## 2021-12-28 ENCOUNTER — Encounter: Payer: Self-pay | Admitting: Dermatology

## 2021-12-28 NOTE — Telephone Encounter (Signed)
Can you please call pt to schedule a time to review cutting the naltrexone pills with him to be sure he is splitting them into 1/4 tablet and taking just one per day WITH his meal?  Thank you!

## 2022-01-01 ENCOUNTER — Encounter: Payer: Self-pay | Admitting: Dermatology

## 2022-01-02 ENCOUNTER — Encounter: Payer: Self-pay | Admitting: Family Medicine

## 2022-01-02 ENCOUNTER — Other Ambulatory Visit: Payer: Self-pay

## 2022-01-02 ENCOUNTER — Ambulatory Visit (INDEPENDENT_AMBULATORY_CARE_PROVIDER_SITE_OTHER): Payer: Medicaid Other | Admitting: Family Medicine

## 2022-01-02 VITALS — BP 152/68 | HR 91 | Temp 98.2°F | Wt 193.6 lb

## 2022-01-02 DIAGNOSIS — I10 Essential (primary) hypertension: Secondary | ICD-10-CM | POA: Diagnosis not present

## 2022-01-02 DIAGNOSIS — M1A09X0 Idiopathic chronic gout, multiple sites, without tophus (tophi): Secondary | ICD-10-CM | POA: Diagnosis not present

## 2022-01-02 DIAGNOSIS — K219 Gastro-esophageal reflux disease without esophagitis: Secondary | ICD-10-CM | POA: Diagnosis not present

## 2022-01-02 DIAGNOSIS — E782 Mixed hyperlipidemia: Secondary | ICD-10-CM

## 2022-01-02 HISTORY — DX: Essential (primary) hypertension: I10

## 2022-01-02 MED ORDER — LISINOPRIL 10 MG PO TABS: 10.0000 mg | ORAL_TABLET | Freq: Every day | ORAL | 3 refills | Status: DC

## 2022-01-02 NOTE — Assessment & Plan Note (Signed)
Under good control on current regimen. Continue current regimen. Continue to monitor. Call with any concerns. Refills up to date. Recheck 3 months at physical.

## 2022-01-02 NOTE — Telephone Encounter (Signed)
No, thank you. He actually responded the pharmacist cut the pills in 1/4s for him, so not sure what happened that he ran out, but I advised him to restart 1/4 tab every other day with food and increase to daily if tolerated. Thanks!

## 2022-01-02 NOTE — Progress Notes (Signed)
BP (!) 152/68    Pulse 91    Temp 98.2 F (36.8 C)    Wt 193 lb 9.6 oz (87.8 kg)    SpO2 98%    BMI 33.23 kg/m    Subjective:    Patient ID: Jesus Guerrero, male    DOB: [birth year], 51 y.o.   MRN: [id number]  HPI: Jesus Guerrero is a 51 y.o. male  Chief Complaint  Patient presents with   Gastroesophageal Reflux   Hyperlipidemia   Gout   HYPERTENSION / HYPERLIPIDEMIA Satisfied with current treatment? no Duration of hypertension: months BP monitoring frequency: not checking BP medication side effects: not on anything Duration of hyperlipidemia: chronic Cholesterol medication side effects: no Cholesterol supplements: none Past cholesterol medications: none Medication compliance: excellent compliance Aspirin: yes Recent stressors: no Recurrent headaches: no Visual changes: no Palpitations: no Dyspnea: no Chest pain: no Lower extremity edema: no Dizzy/lightheaded: no  GERD GERD control status: controlled Satisfied with current treatment? yes Heartburn frequency: occasionally Medication side effects: no  Medication compliance: excellent Dysphagia: no Odynophagia:  no Hematemesis: no Blood in stool: no EGD: yes  No gout flares. Tolerating her medicine well.   Relevant past medical, surgical, family and social history reviewed and updated as indicated. Interim medical history since our last visit reviewed. Allergies and medications reviewed and updated.  Review of Systems  Constitutional: Negative.   Respiratory: Negative.    Cardiovascular: Negative.   Musculoskeletal: Negative.   Neurological: Negative.   Psychiatric/Behavioral: Negative.     Per HPI unless specifically indicated above     Objective:    BP (!) 152/68    Pulse 91    Temp 98.2 F (36.8 C)    Wt 193 lb 9.6 oz (87.8 kg)    SpO2 98%    BMI 33.23 kg/m   Wt Readings from Last 3 Encounters:  01/02/22 193 lb 9.6 oz (87.8 kg)  10/02/21 195 lb (88.5 kg)  08/04/21 194 lb (88 kg)     Physical Exam Vitals and nursing note reviewed.  Constitutional:      General: He is not in acute distress.    Appearance: Normal appearance. He is not ill-appearing, toxic-appearing or diaphoretic.  HENT:     Head: Normocephalic and atraumatic.     Right Ear: External ear normal.     Left Ear: External ear normal.     Nose: Nose normal.     Mouth/Throat:     Mouth: Mucous membranes are moist.     Pharynx: Oropharynx is clear.  Eyes:     General: No scleral icterus.       Right eye: No discharge.        Left eye: No discharge.     Extraocular Movements: Extraocular movements intact.     Conjunctiva/sclera: Conjunctivae normal.     Pupils: Pupils are equal, round, and reactive to light.  Cardiovascular:     Rate and Rhythm: Normal rate and regular rhythm.     Pulses: Normal pulses.     Heart sounds: Normal heart sounds. No murmur heard.   No friction rub. No gallop.  Pulmonary:     Effort: Pulmonary effort is normal. No respiratory distress.     Breath sounds: Normal breath sounds. No stridor. No wheezing, rhonchi or rales.  Chest:     Chest wall: No tenderness.  Musculoskeletal:        General: Normal range of motion.     Cervical back: Normal range of  motion and neck supple.  Skin:    General: Skin is warm and dry.     Capillary Refill: Capillary refill takes less than 2 seconds.     Coloration: Skin is not jaundiced or pale.     Findings: No bruising, erythema, lesion or rash.  Neurological:     General: No focal deficit present.     Mental Status: He is alert and oriented to person, place, and time. Mental status is at baseline.  Psychiatric:        Mood and Affect: Mood normal.        Behavior: Behavior normal.        Thought Content: Thought content normal.        Judgment: Judgment normal.    Results for orders placed or performed in visit on 10/02/21  Comprehensive metabolic panel  Result Value Ref Range   Glucose 102 (H) 70 - 99 mg/dL   BUN 16 6 - 24  mg/dL   Creatinine, Ser 1.25 0.76 - 1.27 mg/dL   eGFR 70 >59 mL/min/1.73   BUN/Creatinine Ratio 13 9 - 20   Sodium 144 134 - 144 mmol/L   Potassium 4.9 3.5 - 5.2 mmol/L   Chloride 102 96 - 106 mmol/L   CO2 23 20 - 29 mmol/L   Calcium 9.8 8.7 - 10.2 mg/dL   Total Protein 7.3 6.0 - 8.5 g/dL   Albumin 4.8 3.8 - 4.9 g/dL   Globulin, Total 2.5 1.5 - 4.5 g/dL   Albumin/Globulin Ratio 1.9 1.2 - 2.2   Bilirubin Total 0.5 0.0 - 1.2 mg/dL   Alkaline Phosphatase 144 (H) 44 - 121 IU/L   AST 16 0 - 40 IU/L   ALT 16 0 - 44 IU/L  CBC with Differential/Platelet  Result Value Ref Range   WBC 9.6 3.4 - 10.8 x10E3/uL   RBC 4.88 4.14 - 5.80 x10E6/uL   Hemoglobin 15.1 13.0 - 17.7 g/dL   Hematocrit 43.5 37.5 - 51.0 %   MCV 89 79 - 97 fL   MCH 30.9 26.6 - 33.0 pg   MCHC 34.7 31.5 - 35.7 g/dL   RDW 12.8 11.6 - 15.4 %   Platelets 250 150 - 450 x10E3/uL   Neutrophils 73 Not Estab. %   Lymphs 20 Not Estab. %   Monocytes 5 Not Estab. %   Eos 1 Not Estab. %   Basos 1 Not Estab. %   Neutrophils Absolute 7.1 (H) 1.4 - 7.0 x10E3/uL   Lymphocytes Absolute 1.9 0.7 - 3.1 x10E3/uL   Monocytes Absolute 0.5 0.1 - 0.9 x10E3/uL   EOS (ABSOLUTE) 0.1 0.0 - 0.4 x10E3/uL   Basophils Absolute 0.1 0.0 - 0.2 x10E3/uL   Immature Granulocytes 0 Not Estab. %   Immature Grans (Abs) 0.0 0.0 - 0.1 x10E3/uL  Lipid Panel w/o Chol/HDL Ratio  Result Value Ref Range   Cholesterol, Total 164 100 - 199 mg/dL   Triglycerides 196 (H) 0 - 149 mg/dL   HDL 46 >39 mg/dL   VLDL Cholesterol Cal 33 5 - 40 mg/dL   LDL Chol Calc (NIH) 85 0 - 99 mg/dL  Uric acid  Result Value Ref Range   Uric Acid 5.0 3.8 - 8.4 mg/dL   *Note: Due to a large number of results and/or encounters for the requested time period, some results have not been displayed. A complete set of results can be found in Results Review.      Assessment & Plan:   Problem List Items  Addressed This Visit       Cardiovascular and Mediastinum   Primary hypertension     Will start him on 46m lisinopril and recheck 1 month. Call with any concerns.       Relevant Medications   lisinopril (ZESTRIL) 10 MG tablet     Digestive   GERD (gastroesophageal reflux disease)    Under good control on current regimen. Continue current regimen. Continue to monitor. Call with any concerns. Refills up to date. Recheck 3 months at physical.         Other   Hyperlipidemia    Under good control on current regimen. Continue current regimen. Continue to monitor. Call with any concerns. Refills up to date. Recheck 3 months at physical.       Relevant Medications   lisinopril (ZESTRIL) 10 MG tablet   Chronic gout - Primary    Under good control on current regimen. Continue current regimen. Continue to monitor. Call with any concerns. Refills up to date. Recheck 3 months at physical.         Follow up plan: Return in about 4 weeks (around 01/30/2022).

## 2022-01-02 NOTE — Assessment & Plan Note (Signed)
Will start him on 10mg  lisinopril and recheck 1 month. Call with any concerns.

## 2022-01-08 ENCOUNTER — Encounter: Payer: Self-pay | Admitting: Dermatology

## 2022-01-09 ENCOUNTER — Ambulatory Visit: Payer: Medicaid Other

## 2022-01-09 ENCOUNTER — Other Ambulatory Visit: Payer: Self-pay | Admitting: Dermatology

## 2022-01-09 DIAGNOSIS — L209 Atopic dermatitis, unspecified: Secondary | ICD-10-CM

## 2022-01-16 ENCOUNTER — Encounter: Payer: Self-pay | Admitting: Dermatology

## 2022-01-19 ENCOUNTER — Other Ambulatory Visit: Payer: Self-pay | Admitting: Family Medicine

## 2022-01-19 NOTE — Telephone Encounter (Signed)
Requested Prescriptions  Pending Prescriptions Disp Refills   cetirizine (ZYRTEC) 10 MG tablet [Pharmacy Med Name: Cetirizine HCl 10 MG Oral Tablet] 90 tablet 0    Sig: Take 1 tablet by mouth once daily     Ear, Nose, and Throat:  Antihistamines Passed - 01/19/2022  2:05 PM      Passed - Valid encounter within last 12 months    Recent Outpatient Visits          2 weeks ago Chronic gout of multiple sites, unspecified cause   [place], Megan P, DO   3 months ago Mixed hyperlipidemia   [employer], [place], DO   5 months ago Bilateral foot pain   [place], [place], DO   6 months ago [place], Megan P, DO   7 months ago Acute idiopathic gout involving toe of right foot   [place], Barb Merino, DO      Future Appointments            In 1 week Laurence Ferrari, [place], MD [place]   In 2 weeks [place], Barb Merino, DO [facility]

## 2022-01-24 ENCOUNTER — Encounter: Payer: Self-pay | Admitting: Dermatology

## 2022-01-29 ENCOUNTER — Encounter: Payer: Self-pay | Admitting: Dermatology

## 2022-01-30 ENCOUNTER — Ambulatory Visit: Payer: Medicaid Other | Admitting: Dermatology

## 2022-01-30 ENCOUNTER — Other Ambulatory Visit: Payer: Self-pay

## 2022-01-30 DIAGNOSIS — T148XXA Other injury of unspecified body region, initial encounter: Secondary | ICD-10-CM

## 2022-01-30 DIAGNOSIS — L299 Pruritus, unspecified: Secondary | ICD-10-CM | POA: Diagnosis not present

## 2022-01-30 DIAGNOSIS — L2089 Other atopic dermatitis: Secondary | ICD-10-CM

## 2022-01-30 DIAGNOSIS — L739 Follicular disorder, unspecified: Secondary | ICD-10-CM | POA: Diagnosis not present

## 2022-01-30 DIAGNOSIS — D239 Other benign neoplasm of skin, unspecified: Secondary | ICD-10-CM

## 2022-01-30 DIAGNOSIS — D234 Other benign neoplasm of skin of scalp and neck: Secondary | ICD-10-CM | POA: Diagnosis not present

## 2022-01-30 MED ORDER — OPZELURA 1.5 % EX CREA: 1.0000 "application " | TOPICAL_CREAM | Freq: Every day | CUTANEOUS | 1 refills | Status: DC

## 2022-01-30 MED ORDER — TRALOKINUMAB-LDRM 150 MG/ML ~~LOC~~ SOSY
300.0000 mg | PREFILLED_SYRINGE | Freq: Once | SUBCUTANEOUS | Status: AC
  Administered 2022-01-30: 300 mg via SUBCUTANEOUS

## 2022-01-30 NOTE — Patient Instructions (Addendum)
Cryotherapy Aftercare  Wash gently with soap and water everyday.   Apply Vaseline and Band-Aid daily until healed.   Prior to procedure, discussed risks of blister formation, small wound, skin dyspigmentation, or rare scar following cryotherapy. Recommend Vaseline ointment to treated areas while healing.  Restart naltrexone 1/4 tablet every other day with a large meal.   Restart Elidel and Eucrisa twice daily to hands.   If You Need Anything After Your Visit  If you have any questions or concerns for your doctor, please call our main line at [phone] and press option 4 to reach your doctor's medical assistant. If no one answers, please leave a voicemail as directed and we will return your call as soon as possible. Messages left after 4 pm will be answered the following business day.   You may also send [place] a message via [place]. We typically respond to MyChart messages within 1-2 business days.  For prescription refills, please ask your pharmacy to contact our office. Our fax number is [phone].  If you have an urgent issue when the clinic is closed that cannot wait until the next business day, you can page your doctor at the number below.    Please note that while we do our best to be available for urgent issues outside of office hours, we are not available 24/7.   If you have an urgent issue and are unable to reach [place], you may choose to seek medical care at your doctor's office, retail clinic, urgent care center, or emergency room.  If you have a medical emergency, please immediately call 911 or go to the emergency department.  Pager Numbers  - Dr. Nehemiah Massed: [phone]  - Dr. Laurence Ferrari: [phone]  - Dr. Nicole Kindred: [phone]  In the event of inclement weather, please call our main line at [phone] for an update on the status of any delays or closures.  Dermatology Medication Tips: Please keep the boxes that topical medications come in in order to help keep track of  the instructions about where and how to use these. Pharmacies typically print the medication instructions only on the boxes and not directly on the medication tubes.   If your medication is too expensive, please contact our office at [phone] option 4 or send [place] a message through [place].   We are unable to tell what your co-pay for medications will be in advance as this is different depending on your insurance coverage. However, we may be able to find a substitute medication at lower cost or fill out paperwork to get insurance to cover a needed medication.   If a prior authorization is required to get your medication covered by your insurance company, please allow [place] 1-2 business days to complete this process.  Drug prices often vary depending on where the prescription is filled and some pharmacies may offer cheaper prices.  The website www.goodrx.com contains coupons for medications through different pharmacies. The prices here do not account for what the cost may be with help from insurance (it may be cheaper with your insurance), but the website can give you the price if you did not use any insurance.  - You can print the associated coupon and take it with your prescription to the pharmacy.  - You may also stop by our office during regular business hours and pick up a GoodRx coupon card.  - If you need your prescription sent electronically to a different pharmacy, notify our office through [facility] or by phone at [phone]  option 4.     Si Usted Necesita Algo Despus de Su Visita  Tambin puede enviarnos un mensaje a travs de Pharmacist, community. Por lo general respondemos a los mensajes de MyChart en el transcurso de 1 a 2 das hbiles.  Para renovar recetas, por favor pida a su farmacia que se ponga en contacto con nuestra oficina. Harland Dingwall de fax es [place] [phone].  Si tiene un asunto urgente cuando la clnica est cerrada y que no puede esperar hasta el siguiente da  hbil, puede llamar/localizar a su doctor(a) al nmero que aparece a continuacin.   Por favor, tenga en cuenta que aunque hacemos todo lo posible para estar disponibles para asuntos urgentes fuera del horario de [place], no estamos disponibles las 24 horas del da, los 7 das de la [place].   Si tiene un problema urgente y no puede comunicarse con nosotros, puede optar por buscar atencin mdica  en el consultorio de su doctor(a), en una clnica privada, en un centro de atencin urgente o en una sala de emergencias.  Si tiene Engineering geologist, por favor llame inmediatamente al 911 o vaya a la sala de emergencias.  Nmeros de bper  - Dr. Nehemiah Massed: [phone]  - Dra. Moye: [phone]  - Dra. Nicole Kindred: [phone]  En caso de inclemencias del [place], por favor llame a Johnsie Kindred principal al [phone] para una actualizacin sobre el [place] de cualquier retraso o cierre.  Consejos para la medicacin en dermatologa: Por favor, guarde las cajas en las que vienen los medicamentos de uso tpico para ayudarle a seguir las instrucciones sobre dnde y cmo usarlos. Las farmacias generalmente imprimen las instrucciones del medicamento slo en las cajas y no directamente en los tubos del [place].   Si su medicamento es muy caro, por favor, pngase en contacto con Zigmund Daniel llamando al [phone] y presione la opcin 4 o envenos un mensaje a travs de Pharmacist, community.   No podemos decirle cul ser su copago por los medicamentos por adelantado ya que esto es diferente dependiendo de la cobertura de su seguro. Sin embargo, es posible que podamos encontrar un medicamento sustituto a Electrical engineer un formulario para que el seguro cubra el medicamento que se considera necesario.   Si se requiere una autorizacin previa para que su compaa de seguros [place] su medicamento, por favor permtanos de 1 a 2 das hbiles para completar este proceso.  Los precios de los medicamentos  varan con frecuencia dependiendo del Environmental consultant de dnde se surte la receta y alguna farmacias pueden ofrecer precios ms baratos.  El sitio web www.goodrx.com tiene cupones para medicamentos de Airline pilot. Los precios aqu no tienen en cuenta lo que podra costar con la ayuda del seguro (puede ser ms barato con su seguro), pero el sitio web puede darle el precio si no utiliz Research scientist (physical sciences).  - Puede imprimir el cupn correspondiente y llevarlo con su receta a la farmacia.  - Tambin puede pasar por nuestra oficina durante el horario de atencin regular y Charity fundraiser una tarjeta de cupones de GoodRx.  - Si necesita que su receta se enve electrnicamente a una farmacia diferente, informe a nuestra oficina a travs de MyChart de [place] o por telfono llamando al [phone] y presione la opcin 4.

## 2022-01-30 NOTE — Progress Notes (Signed)
Follow-Up Visit   Subjective  Jesus Guerrero is a 51 y.o. male who presents for the following: Follow-up (Patient here today for 1 month atopic dermatitis follow up. Patient currently on Adbry injections every 2 weeks, he is not using any other medications at this time. Patient advises his hands are a little bit better. He does have a spot behind right ear that has been sore and bleeding and he has been using mupirocin on. Area treated last visit at scalp with LN2 is still bothersome for patient. ).  Patient also has a few sore itchy spots at neck.   Patient not taking naltrexone due to nausea.   The following portions of the chart were reviewed this encounter and updated as appropriate:   Tobacco   Allergies   Meds   Problems   Med Hx   Surg Hx   Fam Hx       Review of Systems:  No other skin or systemic complaints except as noted in HPI or Assessment and Plan.  Objective  Well appearing patient in no apparent distress; mood and affect are within normal limits.  A focused examination was performed including scalp, neck, ears and hands. Relevant physical exam findings are noted in the Assessment and Plan.  bilateral hands Scaly pink papules coalescing to plaques with fissures at hands,   Scalp Pink lichenified plaque  Right postauricular Excoriation  Neck - Posterior Pustule     Assessment & Plan  Other atopic dermatitis bilateral hands  Chronic condition with duration or expected duration over one year. Condition is bothersome to patient. Not currently at goal.  Inadequate control on Dupixent or Adbry with tacrolimus, Elidel, Eucrisa and clobetasol   Start Opzelura daily.  If cannot get covered, consider switching from Adbry to Rinvoq, though reviewed potential risks of infection, cardiovascular side effects, clots.  Restart elidel and eucrisa twice daily to hands.   Adbry 300 mg/mL injected to patient's right upper arm. Patient tolerated well. Lot #  [id number] Exp: 03/2023  Ruxolitinib Phosphate (OPZELURA) 1.5 % CREA - bilateral hands Apply 1 application topically daily.  Related Medications Crisaborole (EUCRISA) 2 % OINT Apply to hands 1-2 times daily as needed  Tralokinumab-ldrm SOSY 300 mg   Other benign neoplasm of skin, unspecified Scalp  C/w prurigo nodularis, biopsy proven Symptomatic  Patient has had some improvement with LN2  Prior to procedure, discussed risks of blister formation, small wound, skin dyspigmentation, or rare scar following cryotherapy. Recommend Vaseline ointment to treated areas while healing.   Destruction of lesion - Scalp  Destruction method: cryotherapy   Informed consent: discussed and consent obtained   Lesion destroyed using liquid nitrogen: Yes   Cryotherapy cycles:  2 Outcome: patient tolerated procedure well with no complications   Post-procedure details: wound care instructions given    Excoriation Right postauricular  Recommend mupirocin daily and cover with band aid  Folliculitis Neck - Posterior  Start mupirocin 3 times per day  Pruritus Right Forearm - Anterior  Persistent pruritus at scalp and other places sometimes without visible skin change other than excoriation  Ddx includes secondary to atopic dermatitis vs neurogenic. Scalp also with prurigo nodule/LSC changes.  Restart naltrexone 1/4 tablet every other day. Take with large meal. Call or message if having trouble with nausea.   Recommend OTC [place] Bond Rapid Relief Anti-Itch cream (pramoxine + menthol), CeraVe Anti-itch cream or lotion (pramoxine), Sarna lotion (Original- menthol + camphor or Sensitive- pramoxine) or Eucerin 12 hour Itch Relief  lotion (menthol) up to 3 times per day to areas on body that are itchy.    Return for 4-6 weeks .  [name], RMA, am acting as scribe for [name], MD .  Documentation: I have reviewed the above documentation for accuracy and completeness, and I agree  with the above.  [name], MD

## 2022-01-31 ENCOUNTER — Encounter: Payer: Self-pay | Admitting: Dermatology

## 2022-02-01 ENCOUNTER — Encounter: Payer: Self-pay | Admitting: Dermatology

## 2022-02-04 ENCOUNTER — Encounter: Payer: Self-pay | Admitting: Dermatology

## 2022-02-06 ENCOUNTER — Ambulatory Visit (INDEPENDENT_AMBULATORY_CARE_PROVIDER_SITE_OTHER): Payer: Medicaid Other | Admitting: Family Medicine

## 2022-02-06 ENCOUNTER — Other Ambulatory Visit: Payer: Self-pay

## 2022-02-06 ENCOUNTER — Encounter: Payer: Self-pay | Admitting: Family Medicine

## 2022-02-06 VITALS — BP 128/70 | HR 97 | Temp 98.0°F | Wt 196.0 lb

## 2022-02-06 DIAGNOSIS — I10 Essential (primary) hypertension: Secondary | ICD-10-CM | POA: Diagnosis not present

## 2022-02-06 MED ORDER — LISINOPRIL 10 MG PO TABS: 10.0000 mg | ORAL_TABLET | Freq: Every day | ORAL | 1 refills | Status: DC

## 2022-02-06 NOTE — Progress Notes (Signed)
BP 128/70    Pulse 97    Temp 98 F (36.7 C) (Oral)    Wt 196 lb (88.9 kg)    SpO2 98%    BMI 33.64 kg/m    Subjective:    Patient ID: Jesus Guerrero, male    DOB: 1971/05/20, 51 y.o.   MRN: [id number]  HPI: Jesus Guerrero is a 51 y.o. male  Chief Complaint  Patient presents with   Hypertension   HYPERTENSION Hypertension status: controlled  Satisfied with current treatment? yes Duration of hypertension: chronic BP monitoring frequency:  not checking BP medication side effects:  no Medication compliance: excellent compliance Previous BP meds: lisinopril Aspirin: no Recurrent headaches: no Visual changes: no Palpitations: no Dyspnea: no Chest pain: no Lower extremity edema: no Dizzy/lightheaded: no  Relevant past medical, surgical, family and social history reviewed and updated as indicated. Interim medical history since our last visit reviewed. Allergies and medications reviewed and updated.  Review of Systems  Constitutional: Negative.   Respiratory: Negative.    Cardiovascular: Negative.   Gastrointestinal: Negative.   Musculoskeletal: Negative.   Psychiatric/Behavioral: Negative.     Per HPI unless specifically indicated above     Objective:    BP 128/70    Pulse 97    Temp 98 F (36.7 C) (Oral)    Wt 196 lb (88.9 kg)    SpO2 98%    BMI 33.64 kg/m   Wt Readings from Last 3 Encounters:  02/06/22 196 lb (88.9 kg)  01/02/22 193 lb 9.6 oz (87.8 kg)  10/02/21 195 lb (88.5 kg)    Physical Exam Vitals and nursing note reviewed.  Constitutional:      General: He is not in acute distress.    Appearance: Normal appearance. He is not ill-appearing, toxic-appearing or diaphoretic.  HENT:     Head: Normocephalic and atraumatic.     Right Ear: External ear normal.     Left Ear: External ear normal.     Nose: Nose normal.     Mouth/Throat:     Mouth: Mucous membranes are moist.     Pharynx: Oropharynx is clear.  Eyes:     General: No scleral icterus.        Right eye: No discharge.        Left eye: No discharge.     Extraocular Movements: Extraocular movements intact.     Conjunctiva/sclera: Conjunctivae normal.     Pupils: Pupils are equal, round, and reactive to light.  Cardiovascular:     Rate and Rhythm: Normal rate and regular rhythm.     Pulses: Normal pulses.     Heart sounds: Normal heart sounds. No murmur heard.   No friction rub. No gallop.  Pulmonary:     Effort: Pulmonary effort is normal. No respiratory distress.     Breath sounds: Normal breath sounds. No stridor. No wheezing, rhonchi or rales.  Chest:     Chest wall: No tenderness.  Musculoskeletal:        General: Normal range of motion.     Cervical back: Normal range of motion and neck supple.  Skin:    General: Skin is warm and dry.     Capillary Refill: Capillary refill takes less than 2 seconds.     Coloration: Skin is not jaundiced or pale.     Findings: No bruising, erythema, lesion or rash.  Neurological:     General: No focal deficit present.     Mental Status:  He is alert and oriented to person, place, and time. Mental status is at baseline.  Psychiatric:        Mood and Affect: Mood normal.        Behavior: Behavior normal.        Thought Content: Thought content normal.        Judgment: Judgment normal.    Results for orders placed or performed in visit on 10/02/21  Comprehensive metabolic panel  Result Value Ref Range   Glucose 102 (H) 70 - 99 mg/dL   BUN 16 6 - 24 mg/dL   Creatinine, Ser 1.25 0.76 - 1.27 mg/dL   eGFR 70 >59 mL/min/1.73   BUN/Creatinine Ratio 13 9 - 20   Sodium 144 134 - 144 mmol/L   Potassium 4.9 3.5 - 5.2 mmol/L   Chloride 102 96 - 106 mmol/L   CO2 23 20 - 29 mmol/L   Calcium 9.8 8.7 - 10.2 mg/dL   Total Protein 7.3 6.0 - 8.5 g/dL   Albumin 4.8 3.8 - 4.9 g/dL   Globulin, Total 2.5 1.5 - 4.5 g/dL   Albumin/Globulin Ratio 1.9 1.2 - 2.2   Bilirubin Total 0.5 0.0 - 1.2 mg/dL   Alkaline Phosphatase 144 (H) 44 - 121 IU/L    AST 16 0 - 40 IU/L   ALT 16 0 - 44 IU/L  CBC with Differential/Platelet  Result Value Ref Range   WBC 9.6 3.4 - 10.8 x10E3/uL   RBC 4.88 4.14 - 5.80 x10E6/uL   Hemoglobin 15.1 13.0 - 17.7 g/dL   Hematocrit 43.5 37.5 - 51.0 %   MCV 89 79 - 97 fL   MCH 30.9 26.6 - 33.0 pg   MCHC 34.7 31.5 - 35.7 g/dL   RDW 12.8 11.6 - 15.4 %   Platelets 250 150 - 450 x10E3/uL   Neutrophils 73 Not Estab. %   Lymphs 20 Not Estab. %   Monocytes 5 Not Estab. %   Eos 1 Not Estab. %   Basos 1 Not Estab. %   Neutrophils Absolute 7.1 (H) 1.4 - 7.0 x10E3/uL   Lymphocytes Absolute 1.9 0.7 - 3.1 x10E3/uL   Monocytes Absolute 0.5 0.1 - 0.9 x10E3/uL   EOS (ABSOLUTE) 0.1 0.0 - 0.4 x10E3/uL   Basophils Absolute 0.1 0.0 - 0.2 x10E3/uL   Immature Granulocytes 0 Not Estab. %   Immature Grans (Abs) 0.0 0.0 - 0.1 x10E3/uL  Lipid Panel w/o Chol/HDL Ratio  Result Value Ref Range   Cholesterol, Total 164 100 - 199 mg/dL   Triglycerides 196 (H) 0 - 149 mg/dL   HDL 46 >39 mg/dL   VLDL Cholesterol Cal 33 5 - 40 mg/dL   LDL Chol Calc (NIH) 85 0 - 99 mg/dL  Uric acid  Result Value Ref Range   Uric Acid 5.0 3.8 - 8.4 mg/dL   *Note: Due to a large number of results and/or encounters for the requested time period, some results have not been displayed. A complete set of results can be found in Results Review.      Assessment & Plan:   Problem List Items Addressed This Visit       Cardiovascular and Mediastinum   Primary hypertension - Primary    Under good control on current regimen. Continue current regimen. Continue to monitor. Call with any concerns. Refills given. Labs drawn today.       Relevant Medications   lisinopril (ZESTRIL) 10 MG tablet   Other Relevant Orders   Basic metabolic panel  Follow up plan: Return in about 5 months (around 07/06/2022).

## 2022-02-06 NOTE — Assessment & Plan Note (Signed)
Under good control on current regimen. Continue current regimen. Continue to monitor. Call with any concerns. Refills given. Labs drawn today.   

## 2022-02-07 LAB — BASIC METABOLIC PANEL
BUN/Creatinine Ratio: 15 (ref 9–20)
BUN: 18 mg/dL (ref 6–24)
CO2: 20 mmol/L (ref 20–29)
Calcium: 9.1 mg/dL (ref 8.7–10.2)
Chloride: 101 mmol/L (ref 96–106)
Creatinine, Ser: 1.21 mg/dL (ref 0.76–1.27)
Glucose: 105 mg/dL — ABNORMAL HIGH (ref 70–99)
Potassium: 4.5 mmol/L (ref 3.5–5.2)
Sodium: 140 mmol/L (ref 134–144)
eGFR: 72 mL/min/{1.73_m2} (ref >59)

## 2022-02-09 ENCOUNTER — Encounter: Payer: Self-pay | Admitting: Dermatology

## 2022-02-13 ENCOUNTER — Ambulatory Visit: Payer: Medicaid Other

## 2022-02-13 ENCOUNTER — Other Ambulatory Visit: Payer: Self-pay

## 2022-02-13 DIAGNOSIS — L209 Atopic dermatitis, unspecified: Secondary | ICD-10-CM | POA: Diagnosis not present

## 2022-02-13 MED ORDER — TRALOKINUMAB-LDRM 150 MG/ML ~~LOC~~ SOSY
300.0000 mg | PREFILLED_SYRINGE | Freq: Once | SUBCUTANEOUS | Status: AC
  Administered 2022-02-13: 300 mg via SUBCUTANEOUS

## 2022-02-13 NOTE — Progress Notes (Signed)
Patient here today for two week Adbry injections. Adbry 150mg /mL X2 injected into left upper arm. Patient tolerated well.  LOT: [id number] EXP: 03/2023

## 2022-02-14 ENCOUNTER — Encounter: Payer: Self-pay | Admitting: Dermatology

## 2022-02-14 ENCOUNTER — Encounter: Payer: Self-pay | Admitting: Family Medicine

## 2022-02-14 ENCOUNTER — Encounter: Payer: Self-pay | Admitting: Podiatry

## 2022-02-15 ENCOUNTER — Encounter: Payer: Self-pay | Admitting: Family Medicine

## 2022-02-18 ENCOUNTER — Encounter: Payer: Self-pay | Admitting: Family Medicine

## 2022-02-19 ENCOUNTER — Encounter: Payer: Self-pay | Admitting: Family Medicine

## 2022-02-19 ENCOUNTER — Other Ambulatory Visit: Payer: Self-pay | Admitting: Family Medicine

## 2022-02-19 NOTE — Telephone Encounter (Signed)
Lmom asking pt to call back to schedule an appt. °

## 2022-02-20 ENCOUNTER — Other Ambulatory Visit: Payer: Self-pay | Admitting: Family Medicine

## 2022-02-20 MED ORDER — CYCLOBENZAPRINE HCL 10 MG PO TABS: 10.0000 mg | ORAL_TABLET | Freq: Every day | ORAL | 3 refills | Status: DC

## 2022-02-20 NOTE — Telephone Encounter (Signed)
Already refilled today 02/20/22 by provider. Will refuse this request.  Requested Prescriptions  Pending Prescriptions Disp Refills   cyclobenzaprine (FLEXERIL) 10 MG tablet [Pharmacy Med Name: Cyclobenzaprine HCl 10 MG Oral Tablet] 30 tablet 0    Sig: TAKE 1 TABLET BY MOUTH AT BEDTIME     Not Delegated - Analgesics:  Muscle Relaxants Failed - 02/19/2022 10:08 PM      Failed - This refill cannot be delegated      Passed - Valid encounter within last 6 months    Recent Outpatient Visits          2 weeks ago Primary hypertension   [place], Megan P, DO   1 month ago Chronic gout of multiple sites, unspecified cause   [employer], [place], DO   4 months ago Mixed hyperlipidemia   [employer], [place], DO   6 months ago Bilateral foot pain   [place], [place], DO   7 months ago [place], [place], DO      Future Appointments            In 1 week [place], [place], MD [place]   In 3 months [place], Barb Merino, DO [facility]

## 2022-02-20 NOTE — Telephone Encounter (Signed)
Requested Prescriptions  Pending Prescriptions Disp Refills   montelukast (SINGULAIR) 10 MG tablet [Pharmacy Med Name: Montelukast Sodium 10 MG Oral Tablet] 90 tablet 0    Sig: TAKE 1 TABLET BY MOUTH AT BEDTIME     Pulmonology:  Leukotriene Inhibitors Passed - 02/20/2022  9:49 AM      Passed - Valid encounter within last 12 months    Recent Outpatient Visits          2 weeks ago Primary hypertension   [place], Megan P, DO   1 month ago Chronic gout of multiple sites, unspecified cause   [employer], [place], DO   4 months ago Mixed hyperlipidemia   [employer], [place], DO   6 months ago Bilateral foot pain   [place], [place], DO   7 months ago [place], [place], DO      Future Appointments            In 1 week [place], [place], MD [place]   In 3 months [place], Barb Merino, DO [facility], [place]

## 2022-02-20 NOTE — Telephone Encounter (Signed)
Patient asking for refills of Cyclobenzaprine.  Last appt 02/06/22 Up coming visit 06/14/22

## 2022-02-27 ENCOUNTER — Other Ambulatory Visit: Payer: Self-pay

## 2022-02-27 ENCOUNTER — Ambulatory Visit: Payer: Medicaid Other | Admitting: Dermatology

## 2022-02-27 ENCOUNTER — Encounter: Payer: Self-pay | Admitting: Dermatology

## 2022-02-27 DIAGNOSIS — L2089 Other atopic dermatitis: Secondary | ICD-10-CM | POA: Diagnosis not present

## 2022-02-27 DIAGNOSIS — S1081XA Abrasion of other specified part of neck, initial encounter: Secondary | ICD-10-CM

## 2022-02-27 DIAGNOSIS — S0081XA Abrasion of other part of head, initial encounter: Secondary | ICD-10-CM

## 2022-02-27 DIAGNOSIS — F458 Other somatoform disorders: Secondary | ICD-10-CM

## 2022-02-27 DIAGNOSIS — T148XXA Other injury of unspecified body region, initial encounter: Secondary | ICD-10-CM

## 2022-02-27 MED ORDER — TRALOKINUMAB-LDRM 150 MG/ML ~~LOC~~ SOSY
300.0000 mg | PREFILLED_SYRINGE | Freq: Once | SUBCUTANEOUS | Status: AC
  Administered 2022-02-27: 300 mg via SUBCUTANEOUS

## 2022-02-27 NOTE — Patient Instructions (Addendum)
For left side recommend OTC Gold Bond Rapid Relief Anti-Itch cream (pramoxine + menthol) up to 3 times per day to areas on body that are itchy. Alternate with over the counter lidocaine cream.   Continue Opzelura once daily  If You Need Anything After Your Visit  If you have any questions or concerns for your doctor, please call our main line at [phone] and press option 4 to reach your doctor's medical assistant. If no one answers, please leave a voicemail as directed and we will return your call as soon as possible. Messages left after 4 pm will be answered the following business day.   You may also send [place] a message via [place]. We typically respond to MyChart messages within 1-2 business days.  For prescription refills, please ask your pharmacy to contact our office. Our fax number is [phone].  If you have an urgent issue when the clinic is closed that cannot wait until the next business day, you can page your doctor at the number below.    Please note that while we do our best to be available for urgent issues outside of office hours, we are not available 24/7.   If you have an urgent issue and are unable to reach [place], you may choose to seek medical care at your doctor's office, retail clinic, urgent care center, or emergency room.  If you have a medical emergency, please immediately call 911 or go to the emergency department.  Pager Numbers  - Dr. Nehemiah Massed: [phone]  - Dr. Laurence Ferrari: [phone]  - Dr. Nicole Kindred: [phone]  In the event of inclement weather, please call our main line at [phone] for an update on the status of any delays or closures.  Dermatology Medication Tips: Please keep the boxes that topical medications come in in order to help keep track of the instructions about where and how to use these. Pharmacies typically print the medication instructions only on the boxes and not directly on the medication tubes.   If your medication is too expensive,  please contact our office at [phone] option 4 or send [place] a message through [place].   We are unable to tell what your co-pay for medications will be in advance as this is different depending on your insurance coverage. However, we may be able to find a substitute medication at lower cost or fill out paperwork to get insurance to cover a needed medication.   If a prior authorization is required to get your medication covered by your insurance company, please allow [place] 1-2 business days to complete this process.  Drug prices often vary depending on where the prescription is filled and some pharmacies may offer cheaper prices.  The website www.goodrx.com contains coupons for medications through different pharmacies. The prices here do not account for what the cost may be with help from insurance (it may be cheaper with your insurance), but the website can give you the price if you did not use any insurance.  - You can print the associated coupon and take it with your prescription to the pharmacy.  - You may also stop by our office during regular business hours and pick up a GoodRx coupon card.  - If you need your prescription sent electronically to a different pharmacy, notify our office through [facility] or by phone at [phone] option 4.     Si Usted Necesita Algo Despus de Su Visita  Tambin puede enviarnos un mensaje a travs de Pharmacist, community. Por lo general respondemos a  los mensajes de MyChart en el transcurso de 1 a 2 das hbiles.  Para renovar recetas, por favor pida a su farmacia que se ponga en contacto con nuestra oficina. Harland Dingwall de fax es [place] [phone].  Si tiene un asunto urgente cuando la clnica est cerrada y que no puede esperar hasta el siguiente da hbil, puede llamar/localizar a su doctor(a) al nmero que aparece a continuacin.   Por favor, tenga en cuenta que aunque hacemos todo lo posible para estar disponibles para asuntos urgentes fuera del  horario de [place], no estamos disponibles las 24 horas del da, los 7 das de la [place].   Si tiene un problema urgente y no puede comunicarse con nosotros, puede optar por buscar atencin mdica  en el consultorio de su doctor(a), en una clnica privada, en un centro de atencin urgente o en una sala de emergencias.  Si tiene Engineering geologist, por favor llame inmediatamente al 911 o vaya a la sala de emergencias.  Nmeros de bper  - Dr. Nehemiah Massed: [phone]  - Dra. Moye: [phone]  - Dra. Nicole Kindred: [phone]  En caso de inclemencias del [place], por favor llame a Johnsie Kindred principal al [phone] para una actualizacin sobre el [place] de cualquier retraso o cierre.  Consejos para la medicacin en dermatologa: Por favor, guarde las cajas en las que vienen los medicamentos de uso tpico para ayudarle a seguir las instrucciones sobre dnde y cmo usarlos. Las farmacias generalmente imprimen las instrucciones del medicamento slo en las cajas y no directamente en los tubos del [place].   Si su medicamento es muy caro, por favor, pngase en contacto con Zigmund Daniel llamando al [phone] y presione la opcin 4 o envenos un mensaje a travs de Pharmacist, community.   No podemos decirle cul ser su copago por los medicamentos por adelantado ya que esto es diferente dependiendo de la cobertura de su seguro. Sin embargo, es posible que podamos encontrar un medicamento sustituto a Electrical engineer un formulario para que el seguro cubra el medicamento que se considera necesario.   Si se requiere una autorizacin previa para que su compaa de seguros [place] su medicamento, por favor permtanos de 1 a 2 das hbiles para completar este proceso.  Los precios de los medicamentos varan con frecuencia dependiendo del Environmental consultant de dnde se surte la receta y alguna farmacias pueden ofrecer precios ms baratos.  El sitio web www.goodrx.com tiene cupones para medicamentos de Office manager. Los precios aqu no tienen en cuenta lo que podra costar con la ayuda del seguro (puede ser ms barato con su seguro), pero el sitio web puede darle el precio si no utiliz Research scientist (physical sciences).  - Puede imprimir el cupn correspondiente y llevarlo con su receta a la farmacia.  - Tambin puede pasar por nuestra oficina durante el horario de atencin regular y Charity fundraiser una tarjeta de cupones de GoodRx.  - Si necesita que su receta se enve electrnicamente a una farmacia diferente, informe a nuestra oficina a travs de MyChart de [place] o por telfono llamando al [phone] y presione la opcin 4.

## 2022-02-27 NOTE — Progress Notes (Signed)
° °  Follow-Up Visit   Subjective  Jesus Guerrero is a 50 y.o. male who presents for the following: Follow-up (Patient here today for atopic dermatitis follow up. Patient currently using Opzelura once daily to hands and is on Adbry injections. Patient advises he has improved. He does have a spot behind right ear that is still sore. ).  Patient unable to tolerate naltrexone due to nausea.   The following portions of the chart were reviewed this encounter and updated as appropriate:   Tobacco   Allergies   Meds   Problems   Med Hx   Surg Hx   Fam Hx       Review of Systems:  No other skin or systemic complaints except as noted in HPI or Assessment and Plan.  Objective  Well appearing patient in no apparent distress; mood and affect are within normal limits.  A focused examination was performed including hands, neck, scalp, back. Relevant physical exam findings are noted in the Assessment and Plan.  bilateral hands Scaly pink plaques with few fissures      left jaw, right postauricular Excoriation   Left Flank       Assessment & Plan  Other atopic dermatitis bilateral hands  Chronic and persistent condition with duration or expected duration over one year. Condition is symptomatic/ bothersome to patient. Not currently at goal.  Improving with Opzelura plus Adbry.  Continue Opzelura to hands once daily.   Continue Adbry 300 mg/mL SQ Q2 wks. Injected to right arm today. Patient tolerated well.   Ok to restart [place] and [place] if he feels these are helpful.   Will give it another couple of months with the Opzelura before considering switch to Rinvoq  Atopic dermatitis (eczema) is a chronic, relapsing, pruritic condition that can significantly affect quality of life. It is often associated with allergic rhinitis and/or asthma and can require treatment with topical medications, phototherapy, or in severe cases biologic injectable medication (Dupixent; Adbry) or Oral JAK  inhibitors.   Related Medications Crisaborole (EUCRISA) 2 % OINT Apply to hands 1-2 times daily as needed  Ruxolitinib Phosphate (OPZELURA) 1.5 % CREA Apply 1 application topically daily.  Tralokinumab-ldrm SOSY 300 mg   Excoriation left jaw, right postauricular  Apply mupirocin and cover with band aid once daily.   Neurogenic pruritus Left Flank  Favored over atopic dermatitis at this site.   Recommend OTC Gold Bond Rapid Relief Anti-Itch cream (pramoxine + menthol), CeraVe Anti-itch cream or lotion (pramoxine), Sarna lotion (Original- menthol + camphor or Sensitive- pramoxine) or Eucerin 12 hour Itch Relief lotion (menthol) up to 3 times per day to areas on body that are itchy.  Can alternate with topical OTC lidocaine cream.   Skin Medicinals not in budget for patient. Patient has failed low dose naltrexone due to nausea.   Could consider gabapentin if not responding to topicals.  Continue Opzelura daily to help with any component of atopic dermatitis here.      Return in about 2 weeks (around 03/13/2022) for with nurse, 2-3 months with Dr. Raliegh Ip or Dr. Maple Hudson, RMA, am acting as scribe for [name], MD .  Documentation: I have reviewed the above documentation for accuracy and completeness, and I agree with the above.  [name], MD

## 2022-03-01 ENCOUNTER — Encounter: Payer: Self-pay | Admitting: Dermatology

## 2022-03-01 ENCOUNTER — Encounter: Payer: Self-pay | Admitting: Family Medicine

## 2022-03-04 ENCOUNTER — Encounter: Payer: Self-pay | Admitting: Dermatology

## 2022-03-06 ENCOUNTER — Encounter: Payer: Self-pay | Admitting: Family Medicine

## 2022-03-12 ENCOUNTER — Telehealth: Payer: Self-pay

## 2022-03-12 NOTE — Telephone Encounter (Signed)
Per Merleen Milliner will be delivered here Wednesday 03/14/2022 ?

## 2022-03-13 ENCOUNTER — Other Ambulatory Visit: Payer: Self-pay

## 2022-03-13 ENCOUNTER — Ambulatory Visit: Payer: Medicaid Other | Admitting: Dermatology

## 2022-03-13 DIAGNOSIS — L400 Psoriasis vulgaris: Secondary | ICD-10-CM

## 2022-03-13 DIAGNOSIS — L209 Atopic dermatitis, unspecified: Secondary | ICD-10-CM

## 2022-03-13 MED ORDER — TRALOKINUMAB-LDRM 150 MG/ML ~~LOC~~ SOSY
300.0000 mg | PREFILLED_SYRINGE | Freq: Once | SUBCUTANEOUS | Status: AC
  Administered 2022-03-13: 300 mg via SUBCUTANEOUS

## 2022-03-13 NOTE — Progress Notes (Signed)
Patient here today for two week Adbry injection for atopic dermatitis. Adbry '300mg'$ /32m injected into left arm. Patient tolerated well. ? ?LOT: [id number]?EXP: 09/2023 ? ?Patient to return in two weeks for injection. ? ?AJohnsie Kindred RMA ? ?Documentation: I have reviewed the above documentation for accuracy and completeness, and I agree with the above. ? ?DSarina Ser MD ? ?

## 2022-03-14 ENCOUNTER — Encounter: Payer: Self-pay | Admitting: Family Medicine

## 2022-03-16 ENCOUNTER — Other Ambulatory Visit: Payer: Self-pay | Admitting: Dermatology

## 2022-03-16 DIAGNOSIS — L209 Atopic dermatitis, unspecified: Secondary | ICD-10-CM

## 2022-03-19 ENCOUNTER — Encounter: Payer: Self-pay | Admitting: Dermatology

## 2022-03-22 ENCOUNTER — Encounter: Payer: Self-pay | Admitting: Family Medicine

## 2022-03-23 ENCOUNTER — Encounter: Payer: Self-pay | Admitting: Family Medicine

## 2022-03-27 ENCOUNTER — Other Ambulatory Visit: Payer: Self-pay

## 2022-03-27 ENCOUNTER — Ambulatory Visit: Payer: Medicaid Other | Admitting: Dermatology

## 2022-03-27 DIAGNOSIS — L209 Atopic dermatitis, unspecified: Secondary | ICD-10-CM

## 2022-03-27 MED ORDER — TRALOKINUMAB-LDRM 150 MG/ML ~~LOC~~ SOSY
300.0000 mg | PREFILLED_SYRINGE | Freq: Once | SUBCUTANEOUS | Status: AC
  Administered 2022-03-27: 300 mg via SUBCUTANEOUS

## 2022-03-27 NOTE — Progress Notes (Signed)
Patient here today for Adbry injections for atopic dermatitis. Adbry '150mg'$ /mL X2 injected into right arm. Patient tolerated injections well. ? ?LOT: [id number] ?EXP: 09/2023 ? ?Johnsie Kindred, RMA ? ?Documentation: I have reviewed the above documentation for accuracy and completeness, and I agree with the above. ? ?[name] MD  ?

## 2022-03-28 ENCOUNTER — Encounter: Payer: Self-pay | Admitting: Family Medicine

## 2022-04-07 ENCOUNTER — Encounter: Payer: Self-pay | Admitting: Dermatology

## 2022-04-09 ENCOUNTER — Encounter: Payer: Self-pay | Admitting: Family Medicine

## 2022-04-09 ENCOUNTER — Other Ambulatory Visit: Payer: Self-pay | Admitting: Family Medicine

## 2022-04-10 ENCOUNTER — Ambulatory Visit: Payer: Medicaid Other | Admitting: Dermatology

## 2022-04-10 DIAGNOSIS — L209 Atopic dermatitis, unspecified: Secondary | ICD-10-CM

## 2022-04-10 MED ORDER — TRALOKINUMAB-LDRM 150 MG/ML ~~LOC~~ SOSY
300.0000 mg | PREFILLED_SYRINGE | Freq: Once | SUBCUTANEOUS | Status: AC
  Administered 2022-04-10: 300 mg via SUBCUTANEOUS

## 2022-04-10 NOTE — Progress Notes (Signed)
Patient here today for two week Adbry injections for severe atopic dermatitis.  ? ?Adbry '150mg'$  X2 injected into left upper arm. Patient tolerated well. ? ?Lot: [id number] ?EXP: 09/2023 ? ?Johnsie Kindred, RMA ?Documentation: I have reviewed the above documentation for accuracy and completeness, and I agree with the above. ? ?Sarina Ser, MD ? ?

## 2022-04-17 ENCOUNTER — Encounter: Payer: Self-pay | Admitting: Dermatology

## 2022-04-23 ENCOUNTER — Encounter: Payer: Self-pay | Admitting: Dermatology

## 2022-04-23 ENCOUNTER — Other Ambulatory Visit: Payer: Self-pay | Admitting: Family Medicine

## 2022-04-24 ENCOUNTER — Encounter: Payer: Self-pay | Admitting: Family Medicine

## 2022-04-24 ENCOUNTER — Ambulatory Visit: Payer: Medicaid Other | Admitting: Dermatology

## 2022-04-24 ENCOUNTER — Other Ambulatory Visit: Payer: Self-pay | Admitting: Family Medicine

## 2022-04-24 DIAGNOSIS — L209 Atopic dermatitis, unspecified: Secondary | ICD-10-CM

## 2022-04-24 MED ORDER — TRALOKINUMAB-LDRM 150 MG/ML ~~LOC~~ SOSY
300.0000 mg | PREFILLED_SYRINGE | Freq: Once | SUBCUTANEOUS | Status: AC
  Administered 2022-04-24: 300 mg via SUBCUTANEOUS

## 2022-04-24 NOTE — Telephone Encounter (Signed)
Patient requesting refill of Lisinopril.

## 2022-04-24 NOTE — Telephone Encounter (Signed)
Pt is calling back to follow up on medication refill. Pt is also requesting lisinopril (ZESTRIL) 10 MG tablet and fluticasone (FLONASE) 50 MCG/ACT nasal spray ? ?[place] ([address] [address]  ?[place] ([place]) [place] [address]  ?Phone: [phone] Fax: [phone]  ?Hours: Not open 24 hours  ? ?

## 2022-04-24 NOTE — Progress Notes (Signed)
Patient here today for Adbry injections for severe atopic dermatitis.  ? ?Adbry '150mg'$ /mL X2 injected into right arm. Patient tolerated well. ? ?LOT: [id number] ?EXP: 09/2023 ? ?Documentation: I have reviewed the above documentation for accuracy and completeness, and I agree with the above. ? ?Sarina Ser, MD ? ?

## 2022-04-25 ENCOUNTER — Encounter: Payer: Self-pay | Admitting: Family Medicine

## 2022-04-25 NOTE — Telephone Encounter (Signed)
Requested medication (s) are due for refill today: no ? ?Requested medication (s) are on the active medication list: yes ? ?Last refill:  02/06/22 ? ?Future visit scheduled: no ? ?Notes to clinic:  Unable to refill per protocol, medication was refused 04/24/22, request too soon. Last refill 02/06/22 for 90, 1 refill. ? ? ?  ?Requested Prescriptions  ?Pending Prescriptions Disp Refills  ? lisinopril (ZESTRIL) 10 MG tablet [Pharmacy Med Name: Lisinopril 10 MG Oral Tablet] 90 tablet 0  ?  Sig: Take 1 tablet by mouth once daily  ?  ? Cardiovascular:  ACE Inhibitors Passed - 04/23/2022  3:50 PM  ?  ?  Passed - Cr in normal range and within 180 days  ?  Creatinine  ?Date Value Ref Range Status  ?11/30/2014 1.24 0.60 - 1.30 mg/dL Final  ? ?Creatinine, Ser  ?Date Value Ref Range Status  ?02/06/2022 1.21 0.76 - 1.27 mg/dL Final  ?  ?  ?  ?  Passed - K in normal range and within 180 days  ?  Potassium  ?Date Value Ref Range Status  ?02/06/2022 4.5 3.5 - 5.2 mmol/L Final  ?11/30/2014 4.3 3.5 - 5.1 mmol/L Final  ?  ?  ?  ?  Passed - Patient is not pregnant  ?  ?  Passed - Last BP in normal range  ?  BP Readings from Last 1 Encounters:  ?02/06/22 128/70  ?  ?  ?  ?  Passed - Valid encounter within last 6 months  ?  Recent Outpatient Visits   ? ?      ? 2 months ago Primary hypertension  ? [place] P, DO  ? 3 months ago Chronic gout of multiple sites, unspecified cause  ? [place] P, DO  ? 6 months ago Mixed hyperlipidemia  ? [place], Megan P, DO  ? 8 months ago Bilateral foot pain  ? [place], [place] P, DO  ? 9 months ago Folliculitis  ? [place], [place] P, DO  ? ?  ?  ?Future Appointments   ? ?        ? In 2 weeks Ralene Bathe, MD [place]  ? In 1 month [place], Barb Merino, DO [facility]  ? ?  ? ? ?  ?  ?  ? ? ?

## 2022-04-26 ENCOUNTER — Other Ambulatory Visit: Payer: Self-pay | Admitting: Family Medicine

## 2022-04-26 ENCOUNTER — Encounter: Payer: Self-pay | Admitting: Dermatology

## 2022-04-26 MED ORDER — FLUTICASONE PROPIONATE 50 MCG/ACT NA SUSP: 2.0000 | Freq: Every day | NASAL | 12 refills | Status: DC

## 2022-04-26 NOTE — Telephone Encounter (Signed)
Duplicate request- signed today by provider ?Requested Prescriptions  ?Pending Prescriptions Disp Refills  ?? fluticasone (FLONASE) 50 MCG/ACT nasal spray [Pharmacy Med Name: Fluticasone Propionate 50 MCG/ACT Nasal Suspension] 16 g 0  ?  Sig: Use 2 spray(s) in each nostril once daily  ?  ? Not Delegated - Ear, Nose, and Throat: Nasal Preparations - Corticosteroids Failed - 04/24/2022  4:02 PM  ?  ?  Failed - This refill cannot be delegated  ?  ?  Passed - Valid encounter within last 12 months  ?  Recent Outpatient Visits   ?      ? 2 months ago Primary hypertension  ? [place] P, DO  ? 3 months ago Chronic gout of multiple sites, unspecified cause  ? [place] P, DO  ? 6 months ago Mixed hyperlipidemia  ? [place], Megan P, DO  ? 8 months ago Bilateral foot pain  ? [place], [place] P, DO  ? 10 months ago Folliculitis  ? [place], [place] P, DO  ?  ?  ?Future Appointments   ?        ? In 2 weeks Ralene Bathe, MD [place]  ? In 1 month Johnson, Barb Merino, DO [facility], [facility]  ?  ? ?  ?  ?  ? ?

## 2022-04-29 ENCOUNTER — Encounter: Payer: Self-pay | Admitting: Dermatology

## 2022-04-30 NOTE — Telephone Encounter (Signed)
Requested medication (s) are due for refill today: no ? ?Requested medication (s) are on the active medication list: yes ? ?Last refill:  02/06/22 ? ?Future visit scheduled: yes ? ?Notes to clinic: Unable to refill per protocol, request was refused 04/24/22 for refill being too soon to fill. Last refill was 02/06/22 for 90, 1 refill. ? ? ? ?  ?Requested Prescriptions  ?Pending Prescriptions Disp Refills  ? lisinopril (ZESTRIL) 10 MG tablet [Pharmacy Med Name: Lisinopril 10 MG Oral Tablet] 90 tablet 0  ?  Sig: Take 1 tablet by mouth once daily  ?  ? Cardiovascular:  ACE Inhibitors Passed - 04/26/2022 11:30 AM  ?  ?  Passed - Cr in normal range and within 180 days  ?  Creatinine  ?Date Value Ref Range Status  ?11/30/2014 1.24 0.60 - 1.30 mg/dL Final  ? ?Creatinine, Ser  ?Date Value Ref Range Status  ?02/06/2022 1.21 0.76 - 1.27 mg/dL Final  ?  ?  ?  ?  Passed - K in normal range and within 180 days  ?  Potassium  ?Date Value Ref Range Status  ?02/06/2022 4.5 3.5 - 5.2 mmol/L Final  ?11/30/2014 4.3 3.5 - 5.1 mmol/L Final  ?  ?  ?  ?  Passed - Patient is not pregnant  ?  ?  Passed - Last BP in normal range  ?  BP Readings from Last 1 Encounters:  ?02/06/22 128/70  ?  ?  ?  ?  Passed - Valid encounter within last 6 months  ?  Recent Outpatient Visits   ? ?      ? 2 months ago Primary hypertension  ? [place] P, DO  ? 3 months ago Chronic gout of multiple sites, unspecified cause  ? [place] P, DO  ? 7 months ago Mixed hyperlipidemia  ? [place], Megan P, DO  ? 8 months ago Bilateral foot pain  ? [place], [place] P, DO  ? 10 months ago Folliculitis  ? [place], [place] P, DO  ? ?  ?  ?Future Appointments   ? ?        ? In 2 weeks Ralene Bathe, MD [place]  ? In 1 month Johnson, Barb Merino, DO [facility]  ? ?  ? ? ?  ?  ?  ? ? ?

## 2022-05-01 ENCOUNTER — Encounter: Payer: Self-pay | Admitting: Dermatology

## 2022-05-02 ENCOUNTER — Encounter: Payer: Self-pay | Admitting: Dermatology

## 2022-05-09 ENCOUNTER — Encounter: Payer: Self-pay | Admitting: Dermatology

## 2022-05-13 ENCOUNTER — Other Ambulatory Visit: Payer: Self-pay | Admitting: Dermatology

## 2022-05-13 DIAGNOSIS — L2089 Other atopic dermatitis: Secondary | ICD-10-CM

## 2022-05-13 DIAGNOSIS — L209 Atopic dermatitis, unspecified: Secondary | ICD-10-CM

## 2022-05-15 ENCOUNTER — Ambulatory Visit: Payer: Medicaid Other | Admitting: Dermatology

## 2022-05-21 ENCOUNTER — Encounter: Payer: Self-pay | Admitting: Family Medicine

## 2022-05-21 ENCOUNTER — Other Ambulatory Visit: Payer: Self-pay | Admitting: Family Medicine

## 2022-05-23 ENCOUNTER — Ambulatory Visit: Payer: Medicaid Other | Admitting: Dermatology

## 2022-05-23 DIAGNOSIS — L2089 Other atopic dermatitis: Secondary | ICD-10-CM | POA: Diagnosis not present

## 2022-05-23 DIAGNOSIS — Z79899 Other long term (current) drug therapy: Secondary | ICD-10-CM | POA: Diagnosis not present

## 2022-05-23 MED ORDER — TRALOKINUMAB-LDRM 150 MG/ML ~~LOC~~ SOSY
300.0000 mg | PREFILLED_SYRINGE | Freq: Once | SUBCUTANEOUS | Status: AC
  Administered 2022-05-23: 300 mg via SUBCUTANEOUS

## 2022-05-23 NOTE — Telephone Encounter (Signed)
Requested Prescriptions  Pending Prescriptions Disp Refills  . allopurinol (ZYLOPRIM) 300 MG tablet [Pharmacy Med Name: Allopurinol 300 MG Oral Tablet] 90 tablet 0    Sig: Take 1 tablet by mouth once daily     Endocrinology:  Gout Agents - allopurinol Passed - 05/21/2022  5:43 PM      Passed - Uric Acid in normal range and within 360 days    Uric Acid  Date Value Ref Range Status  10/02/2021 5.0 3.8 - 8.4 mg/dL Final    Comment:               Therapeutic target for gout patients: <6.0         Passed - Cr in normal range and within 360 days    Creatinine  Date Value Ref Range Status  11/30/2014 1.24 0.60 - 1.30 mg/dL Final   Creatinine, Ser  Date Value Ref Range Status  02/06/2022 1.21 0.76 - 1.27 mg/dL Final         Passed - Valid encounter within last 12 months    Recent Outpatient Visits          3 months ago Primary hypertension   [facility] [place], Megan P, DO   4 months ago Chronic gout of multiple sites, unspecified cause   [employer], Megan P, DO   7 months ago Mixed hyperlipidemia   [employer], [place], DO   9 months ago Bilateral foot pain   [facility] [place], [place], DO   10 months ago [place], [place], DO      Future Appointments            Today Ralene Bathe, MD [place]   In 3 weeks Wynetta Emery, Barb Merino, DO [place], [facility]           Passed - CBC within normal limits and completed in the last 12 months    WBC  Date Value Ref Range Status  10/02/2021 9.6 3.4 - 10.8 x10E3/uL Final  11/29/2019 8.5 4.0 - 10.5 K/uL Final   RBC  Date Value Ref Range Status  10/02/2021 4.88 4.14 - 5.80 x10E6/uL Final  11/29/2019 5.11 4.22 - 5.81 MIL/uL Final   Hemoglobin  Date Value Ref Range Status  10/02/2021 15.1 13.0 - 17.7 g/dL Final   Hematocrit  Date Value Ref Range Status  10/02/2021 43.5 37.5 - 51.0 % Final   MCHC   Date Value Ref Range Status  10/02/2021 34.7 31.5 - 35.7 g/dL Final  11/29/2019 34.9 30.0 - 36.0 g/dL Final   [facility]  Date Value Ref Range Status  10/02/2021 30.9 26.6 - 33.0 pg Final  11/29/2019 30.3 26.0 - 34.0 pg Final   MCV  Date Value Ref Range Status  10/02/2021 89 79 - 97 fL Final  11/30/2014 88 80 - 100 fL Final   No results found for: PLTCOUNTKUC, LABPLAT, POCPLA RDW  Date Value Ref Range Status  10/02/2021 12.8 11.6 - 15.4 % Final  11/30/2014 13.2 11.5 - 14.5 % Final

## 2022-05-23 NOTE — Patient Instructions (Signed)

## 2022-05-23 NOTE — Progress Notes (Signed)
   Follow-Up Visit   Subjective  Jesus Guerrero is a 51 y.o. male who presents for the following: Atopic dermatitis (Of the scalp, ears, trunk, and extremities - patient's last Adbry injection was a month ago. He c/o persistent itching but states condition has improved since starting Adbry. He uses Opzelura cream for topical treatment and Mupirocin 2% ointment to fissures).  The following portions of the chart were reviewed this encounter and updated as appropriate:   Tobacco  Allergies  Meds  Problems  Med Hx  Surg Hx  Fam Hx     Review of Systems:  No other skin or systemic complaints except as noted in HPI or Assessment and Plan.  Objective  Well appearing patient in no apparent distress; mood and affect are within normal limits.  A focused examination was performed including the trunk and extremities. Relevant physical exam findings are noted in the Assessment and Plan.  Trunk, extremities Excoriations on the back, minimal involvement on the scalp. Pinkness on the hands but no scale. Fissure inferior to R ear.   Assessment & Plan  Other atopic dermatitis Trunk, extremities  Atopic dermatitis (eczema) is a chronic, relapsing, pruritic condition that can significantly affect quality of life. It is often associated with allergic rhinitis and/or asthma and can require treatment with topical medications, phototherapy, or in severe cases biologic injectable medication (Dupixent; Adbry) or Oral JAK inhibitors.  Continue Adbry SQ QM. Tralokinumab [place]) is a treatment given by injection for adults with moderate-to-severe atopic dermatitis. Goal is control of skin condition, not cure. It is given as 4 injections at the first dose followed by 2 injection ever 2 weeks thereafter.  Adbry '150mg'$ /mL x 2 injected SQ into the L upper arm post. Patient tolerated injection well. AL, CMA  Potential side effects include allergic reaction, injection site reactions and conjunctivitis  (inflammation of the eyes).  The use of Adbry requires long term medication management, including periodic office visits.  Continue Opzelura cream to aa's QD PRN.   To the fissure on the ear continue Mupirocin 2% ointment to aa's QD, but start applying Opzelura to aa QD too.  We discussed switching to Rinvoq since pt continues to itch, but he prefers to continue Adbry at this time.   Tralokinumab-ldrm SOSY 300 mg - Trunk, extremities   Related Medications Crisaborole (EUCRISA) 2 % OINT Apply to hands 1-2 times daily as needed  OPZELURA 1.5 % CREA APPLY   TOPICALLY ONCE DAILY   Return in about 6 months (around 11/23/2022) for atopic dermatitis follow up - with Dr. Laurence Ferrari; once a month for 5 mth with nurse for Adbry injections.  Luther Redo, CMA, am acting as scribe for Sarina Ser, MD . Documentation: I have reviewed the above documentation for accuracy and completeness, and I agree with the above.  Sarina Ser, MD

## 2022-05-23 NOTE — Telephone Encounter (Signed)
Patient is requesting a refill of allopurinol be sent to [facility]. Patient was last seen in office on 02/06/22 Has up coming appt on 07/06/22

## 2022-05-28 ENCOUNTER — Encounter: Payer: Self-pay | Admitting: Dermatology

## 2022-05-29 MED ORDER — ALLOPURINOL 300 MG PO TABS: 300.0000 mg | ORAL_TABLET | Freq: Every day | ORAL | 0 refills | Status: DC

## 2022-06-04 ENCOUNTER — Encounter: Payer: Self-pay | Admitting: Family Medicine

## 2022-06-04 ENCOUNTER — Other Ambulatory Visit: Payer: Self-pay | Admitting: Family Medicine

## 2022-06-05 MED ORDER — ATORVASTATIN CALCIUM 20 MG PO TABS: 20.0000 mg | ORAL_TABLET | Freq: Every day | ORAL | 1 refills | Status: DC

## 2022-06-05 NOTE — Telephone Encounter (Signed)
Patient is requesting refill of atorvastatin  Last office visit is 02/06/22  Upcoming visit 06/14/22

## 2022-06-05 NOTE — Telephone Encounter (Signed)
reordered by Kathrine Haddock NP  06/05/22 #90 1 RF and sent to pharmacy Requested Prescriptions  Refused Prescriptions Disp Refills  . atorvastatin (LIPITOR) 20 MG tablet [Pharmacy Med Name: Atorvastatin Calcium 20 MG Oral Tablet] 90 tablet 0    Sig: Take 1 tablet by mouth once daily     Cardiovascular:  Antilipid - Statins Failed - 06/04/2022  6:22 PM      Failed - Lipid Panel in normal range within the last 12 months    Cholesterol, Total  Date Value Ref Range Status  10/02/2021 164 100 - 199 mg/dL Final   LDL Chol Calc (NIH)  Date Value Ref Range Status  10/02/2021 85 0 - 99 mg/dL Final   HDL  Date Value Ref Range Status  10/02/2021 46 >39 mg/dL Final   Triglycerides  Date Value Ref Range Status  10/02/2021 196 (H) 0 - 149 mg/dL Final         Passed - Patient is not pregnant      Passed - Valid encounter within last 12 months    Recent Outpatient Visits          3 months ago Primary hypertension   [place], [place] P, DO   5 months ago Chronic gout of multiple sites, unspecified cause   [employer], [place], DO   8 months ago Mixed hyperlipidemia   [employer], Megan P, DO   10 months ago Bilateral foot pain   [place], [place], DO   11 months ago [place], [place], DO      Future Appointments            In 1 week Wynetta Emery, Barb Merino, DO [employer], [place]   In 4 months [place], [place], [place]

## 2022-06-08 ENCOUNTER — Encounter: Payer: Self-pay | Admitting: Dermatology

## 2022-06-10 ENCOUNTER — Encounter: Payer: Self-pay | Admitting: Dermatology

## 2022-06-10 ENCOUNTER — Encounter: Payer: Self-pay | Admitting: Family Medicine

## 2022-06-11 ENCOUNTER — Encounter: Payer: Self-pay | Admitting: Family Medicine

## 2022-06-11 NOTE — Telephone Encounter (Signed)
LVM asking patient to call back to reschedule his appointment. At this time Dr. Wynetta Emery does not have availability next week on the days the patient has asked for. Called to offer a different day.

## 2022-06-11 NOTE — Telephone Encounter (Signed)
Pt is due for follow up in July. We will check labs at that time.

## 2022-06-11 NOTE — Telephone Encounter (Unsigned)
Copied from [place] [phone]. Topic: General - Inquiry >> Jun 11, 2022 10:13 AM Marcellus Scott wrote: Reason for CRM: Lynne Leader called in and stated he wanted to schedule an appointment for pt for labs to check cholesterol.  No orders.  Please advise.  Herbie [place] is requesting a callback to schedule.

## 2022-06-11 NOTE — Telephone Encounter (Signed)
Called patient and sent mychart message asking patient to call us to schedule an appointment.

## 2022-06-12 ENCOUNTER — Other Ambulatory Visit: Payer: Self-pay | Admitting: Family Medicine

## 2022-06-12 ENCOUNTER — Encounter: Payer: Self-pay | Admitting: Family Medicine

## 2022-06-12 ENCOUNTER — Other Ambulatory Visit: Payer: Self-pay | Admitting: Dermatology

## 2022-06-12 DIAGNOSIS — L2089 Other atopic dermatitis: Secondary | ICD-10-CM

## 2022-06-12 MED ORDER — NAPROXEN 500 MG PO TABS
ORAL_TABLET | ORAL | 0 refills | Status: DC
Start: 2022-06-12 — End: 2022-07-18

## 2022-06-12 NOTE — Telephone Encounter (Signed)
Refilled 06/12/2022 #180 0 refills. Requested Prescriptions  Pending Prescriptions Disp Refills  . naproxen (NAPROSYN) 500 MG tablet [Pharmacy Med Name: Naproxen 500 MG Oral Tablet] 180 tablet 0    Sig: TAKE 1 TABLET BY MOUTH TWICE DAILY WITH A MEAL     Analgesics:  NSAIDS Failed - 06/12/2022 11:08 AM      Failed - Manual Review: Labs are only required if the patient has taken medication for more than 8 weeks.      Passed - Cr in normal range and within 360 days    Creatinine  Date Value Ref Range Status  11/30/2014 1.24 0.60 - 1.30 mg/dL Final   Creatinine, Ser  Date Value Ref Range Status  02/06/2022 1.21 0.76 - 1.27 mg/dL Final         Passed - HGB in normal range and within 360 days    Hemoglobin  Date Value Ref Range Status  10/02/2021 15.1 13.0 - 17.7 g/dL Final         Passed - PLT in normal range and within 360 days    Platelets  Date Value Ref Range Status  10/02/2021 250 150 - 450 x10E3/uL Final         Passed - HCT in normal range and within 360 days    Hematocrit  Date Value Ref Range Status  10/02/2021 43.5 37.5 - 51.0 % Final         Passed - eGFR is 30 or above and within 360 days    EGFR (African American)  Date Value Ref Range Status  11/30/2014 >60 >1m/min Final  12/23/2013 >60  Final   GFR calc Af Amer  Date Value Ref Range Status  09/28/2020 84 >59 mL/min/1.73 Final    Comment:    **[facility] currently reports eGFR in compliance with the current**   recommendations of the [employer] [facility] will   update reporting as new guidelines are published from the NKF-ASN   Task force.    EGFR (Non-African Amer.)  Date Value Ref Range Status  11/30/2014 >60 >664mmin Final    Comment:    eGFR values <6046min/1.73 m2 may be an indication of chronic kidney disease (CKD). Calculated eGFR, using the MRDR Study equation, is useful in  patients with stable renal function. The eGFR calculation will not be reliable in acutely ill  patients when serum creatinine is changing rapidly. It is not useful in patients on dialysis. The eGFR calculation may not be applicable to patients at the low and high extremes of body sizes, pregnant women, and vegetarians.   12/23/2013 >60  Final    Comment:    eGFR values <75m43mn/1.73 m2 may be an indication of chronic kidney disease (CKD). Calculated eGFR is useful in patients with stable renal function. The eGFR calculation will not be reliable in acutely ill patients when serum creatinine is changing rapidly. It is not useful in  patients on dialysis. The eGFR calculation may not be applicable to patients at the low and high extremes of body sizes, pregnant women, and vegetarians.    GFR calc non Af Amer  Date Value Ref Range Status  09/28/2020 73 >59 mL/min/1.73 Final   eGFR  Date Value Ref Range Status  02/06/2022 72 >59 mL/min/1.73 Final         Passed - Patient is not pregnant      Passed - Valid encounter within last 12 months    Recent Outpatient Visits  4 months ago Primary hypertension   [place], Megan P, DO   5 months ago Chronic gout of multiple sites, unspecified cause   [facility] [place], [place], DO   8 months ago Mixed hyperlipidemia   [employer], [place], DO   10 months ago Bilateral foot pain   [place], [place], DO   11 months ago [place], [place], DO      Future Appointments            In 1 month Johnson, Barb Merino, DO [employer], [place]   In 4 months [place], [place], MD [place]

## 2022-06-14 ENCOUNTER — Ambulatory Visit: Payer: Medicaid Other | Admitting: Family Medicine

## 2022-06-17 ENCOUNTER — Other Ambulatory Visit: Payer: Self-pay | Admitting: Family Medicine

## 2022-06-18 ENCOUNTER — Encounter: Payer: Self-pay | Admitting: Family Medicine

## 2022-06-18 ENCOUNTER — Encounter: Payer: Self-pay | Admitting: Dermatology

## 2022-06-18 ENCOUNTER — Other Ambulatory Visit: Payer: Self-pay | Admitting: Family Medicine

## 2022-06-18 MED ORDER — CYCLOBENZAPRINE HCL 10 MG PO TABS
10.0000 mg | ORAL_TABLET | Freq: Every day | ORAL | 3 refills | Status: DC
Start: 2022-06-18 — End: 2022-10-30

## 2022-06-18 NOTE — Telephone Encounter (Signed)
Requested medication (s) are due for refill today - no  Requested medication (s) are on the active medication list -yes  Future visit scheduled -yes  Last refill: 06/18/22 #30 3RF  Notes to clinic: non delegated Rx  Requested Prescriptions  Pending Prescriptions Disp Refills   cyclobenzaprine (FLEXERIL) 10 MG tablet [Pharmacy Med Name: Cyclobenzaprine HCl 10 MG Oral Tablet] 30 tablet 0    Sig: TAKE 1 TABLET BY MOUTH AT BEDTIME     Not Delegated - Analgesics:  Muscle Relaxants Failed - 06/17/2022 12:37 PM      Failed - This refill cannot be delegated      Passed - Valid encounter within last 6 months    Recent Outpatient Visits           4 months ago Primary hypertension   [place], Megan P, DO   5 months ago Chronic gout of multiple sites, unspecified cause   [employer], Megan P, DO   8 months ago Mixed hyperlipidemia   [employer], Megan P, DO   10 months ago Bilateral foot pain   [employer], [place], DO   11 months ago [place], [place], DO       Future Appointments             In 1 month Johnson, Megan P, DO [employer], [place]   In 4 months [place], [place], MD [place]               Requested Prescriptions  Pending Prescriptions Disp Refills   cyclobenzaprine (FLEXERIL) 10 MG tablet [Pharmacy Med Name: Cyclobenzaprine HCl 10 MG Oral Tablet] 30 tablet 0    Sig: TAKE 1 TABLET BY MOUTH AT BEDTIME     Not Delegated - Analgesics:  Muscle Relaxants Failed - 06/17/2022 12:37 PM      Failed - This refill cannot be delegated      Passed - Valid encounter within last 6 months    Recent Outpatient Visits           4 months ago Primary hypertension   [place], Govan P, DO   5 months ago Chronic gout of multiple sites, unspecified cause   [employer], [place], DO   8  months ago Mixed hyperlipidemia   [employer], Megan P, DO   10 months ago Bilateral foot pain   [place], [place], DO   11 months ago [place], [place], DO       Future Appointments             In 1 month Johnson, Barb Merino, DO [employer], [place]   In 4 months [place], [place], [place]

## 2022-06-25 ENCOUNTER — Encounter: Payer: Self-pay | Admitting: Dermatology

## 2022-06-25 ENCOUNTER — Ambulatory Visit (INDEPENDENT_AMBULATORY_CARE_PROVIDER_SITE_OTHER): Payer: Medicaid Other | Admitting: Dermatology

## 2022-06-25 DIAGNOSIS — L209 Atopic dermatitis, unspecified: Secondary | ICD-10-CM

## 2022-06-25 MED ORDER — TRALOKINUMAB-LDRM 150 MG/ML ~~LOC~~ SOSY
300.0000 mg | PREFILLED_SYRINGE | Freq: Once | SUBCUTANEOUS | Status: AC
  Administered 2022-06-25: 300 mg via SUBCUTANEOUS

## 2022-06-26 ENCOUNTER — Other Ambulatory Visit: Payer: Self-pay | Admitting: Dermatology

## 2022-06-26 DIAGNOSIS — L209 Atopic dermatitis, unspecified: Secondary | ICD-10-CM

## 2022-07-06 ENCOUNTER — Ambulatory Visit: Payer: Medicaid Other | Admitting: Family Medicine

## 2022-07-06 ENCOUNTER — Encounter: Payer: Self-pay | Admitting: Family Medicine

## 2022-07-10 ENCOUNTER — Encounter: Payer: Self-pay | Admitting: Family Medicine

## 2022-07-11 ENCOUNTER — Other Ambulatory Visit: Payer: Self-pay | Admitting: Dermatology

## 2022-07-11 DIAGNOSIS — L2089 Other atopic dermatitis: Secondary | ICD-10-CM

## 2022-07-12 ENCOUNTER — Other Ambulatory Visit: Payer: Self-pay | Admitting: Dermatology

## 2022-07-12 DIAGNOSIS — L239 Allergic contact dermatitis, unspecified cause: Secondary | ICD-10-CM

## 2022-07-13 ENCOUNTER — Encounter: Payer: Self-pay | Admitting: Family Medicine

## 2022-07-15 ENCOUNTER — Encounter: Payer: Self-pay | Admitting: Dermatology

## 2022-07-16 ENCOUNTER — Other Ambulatory Visit: Payer: Self-pay

## 2022-07-16 DIAGNOSIS — L209 Atopic dermatitis, unspecified: Secondary | ICD-10-CM

## 2022-07-16 MED ORDER — ADBRY 150 MG/ML ~~LOC~~ SOSY: 300.0000 mg | PREFILLED_SYRINGE | SUBCUTANEOUS | 2 refills | Status: DC

## 2022-07-16 NOTE — Progress Notes (Signed)
Fax request came in for RFs of patient's Adbry. RX sent in to [place]. aw

## 2022-07-16 NOTE — Telephone Encounter (Signed)
appt

## 2022-07-16 NOTE — Telephone Encounter (Signed)
Pt is scheduled 7/19

## 2022-07-18 ENCOUNTER — Encounter: Payer: Self-pay | Admitting: Family Medicine

## 2022-07-18 ENCOUNTER — Ambulatory Visit (INDEPENDENT_AMBULATORY_CARE_PROVIDER_SITE_OTHER): Payer: Medicaid Other | Admitting: Family Medicine

## 2022-07-18 VITALS — BP 125/72 | HR 89 | Temp 98.4°F | Ht 64.0 in | Wt 192.0 lb

## 2022-07-18 DIAGNOSIS — M1A09X0 Idiopathic chronic gout, multiple sites, without tophus (tophi): Secondary | ICD-10-CM | POA: Diagnosis not present

## 2022-07-18 DIAGNOSIS — Z0000 Encounter for general adult medical examination without abnormal findings: Secondary | ICD-10-CM

## 2022-07-18 DIAGNOSIS — E782 Mixed hyperlipidemia: Secondary | ICD-10-CM

## 2022-07-18 DIAGNOSIS — I10 Essential (primary) hypertension: Secondary | ICD-10-CM | POA: Diagnosis not present

## 2022-07-18 LAB — URINALYSIS, ROUTINE W REFLEX MICROSCOPIC
Bilirubin, UA: NEGATIVE
Glucose, UA: NEGATIVE
Ketones, UA: NEGATIVE
Nitrite, UA: NEGATIVE
Protein,UA: NEGATIVE
Specific Gravity, UA: 1.01 (ref 1.005–1.030)
Urobilinogen, Ur: 0.2 mg/dL (ref 0.2–1.0)
pH, UA: 6 (ref 5.0–7.5)

## 2022-07-18 LAB — MICROALBUMIN, URINE WAIVED
Creatinine, Urine Waived: 50 mg/dL (ref 10–300)
Microalb, Ur Waived: 10 mg/L (ref 0–19)
Microalb/Creat Ratio: <30 mg/g (ref <30)

## 2022-07-18 LAB — MICROSCOPIC EXAMINATION
Bacteria, UA: NONE SEEN
Epithelial Cells (non renal): NONE SEEN /hpf (ref 0–10)

## 2022-07-18 MED ORDER — HYDROXYZINE HCL 10 MG PO TABS: ORAL_TABLET | ORAL | 1 refills | Status: DC

## 2022-07-18 MED ORDER — LISINOPRIL 10 MG PO TABS
10.0000 mg | ORAL_TABLET | Freq: Every day | ORAL | 1 refills | Status: DC
Start: 2022-07-18 — End: 2023-01-25

## 2022-07-18 MED ORDER — ATORVASTATIN CALCIUM 20 MG PO TABS: 20.0000 mg | ORAL_TABLET | Freq: Every day | ORAL | 1 refills | Status: DC

## 2022-07-18 MED ORDER — ALLOPURINOL 300 MG PO TABS: 300.0000 mg | ORAL_TABLET | Freq: Every day | ORAL | 1 refills | Status: DC

## 2022-07-18 MED ORDER — NAPROXEN 500 MG PO TABS: ORAL_TABLET | ORAL | 1 refills | Status: DC

## 2022-07-18 NOTE — Assessment & Plan Note (Signed)
Under good control on current regimen. Continue current regimen. Continue to monitor. Call with any concerns. Refills given. Labs drawn today.   
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
## 2022-07-19 LAB — COMPREHENSIVE METABOLIC PANEL
ALT: 18 IU/L (ref 0–44)
AST: 20 IU/L (ref 0–40)
Albumin/Globulin Ratio: 1.9 (ref 1.2–2.2)
Albumin: 4.7 g/dL (ref 3.8–4.9)
Alkaline Phosphatase: 118 IU/L (ref 44–121)
BUN/Creatinine Ratio: 19 (ref 9–20)
BUN: 22 mg/dL (ref 6–24)
Bilirubin Total: 0.9 mg/dL (ref 0.0–1.2)
CO2: 20 mmol/L (ref 20–29)
Calcium: 9.1 mg/dL (ref 8.7–10.2)
Chloride: 102 mmol/L (ref 96–106)
Creatinine, Ser: 1.15 mg/dL (ref 0.76–1.27)
Globulin, Total: 2.5 g/dL (ref 1.5–4.5)
Glucose: 105 mg/dL — ABNORMAL HIGH (ref 70–99)
Potassium: 4.6 mmol/L (ref 3.5–5.2)
Sodium: 137 mmol/L (ref 134–144)
Total Protein: 7.2 g/dL (ref 6.0–8.5)
eGFR: 77 mL/min/{1.73_m2} (ref >59)

## 2022-07-19 LAB — CBC WITH DIFFERENTIAL/PLATELET
Basophils Absolute: 0 10*3/uL (ref 0.0–0.2)
Basos: 0 %
EOS (ABSOLUTE): 0.1 10*3/uL (ref 0.0–0.4)
Eos: 1 %
Hematocrit: 42.9 % (ref 37.5–51.0)
Hemoglobin: 14.8 g/dL (ref 13.0–17.7)
Immature Grans (Abs): 0 10*3/uL (ref 0.0–0.1)
Immature Granulocytes: 0 %
Lymphocytes Absolute: 1.8 10*3/uL (ref 0.7–3.1)
Lymphs: 20 %
MCH: 30.2 pg (ref 26.6–33.0)
MCHC: 34.5 g/dL (ref 31.5–35.7)
MCV: 88 fL (ref 79–97)
Monocytes Absolute: 0.5 10*3/uL (ref 0.1–0.9)
Monocytes: 5 %
Neutrophils Absolute: 6.6 10*3/uL (ref 1.4–7.0)
Neutrophils: 74 %
Platelets: 259 10*3/uL (ref 150–450)
RBC: 4.9 x10E6/uL (ref 4.14–5.80)
RDW: 12.4 % (ref 11.6–15.4)
WBC: 9.1 10*3/uL (ref 3.4–10.8)

## 2022-07-19 LAB — LIPID PANEL W/O CHOL/HDL RATIO
Cholesterol, Total: 155 mg/dL (ref 100–199)
HDL: 39 mg/dL — ABNORMAL LOW (ref >39)
LDL Chol Calc (NIH): 76 mg/dL (ref 0–99)
Triglycerides: 244 mg/dL — ABNORMAL HIGH (ref 0–149)
VLDL Cholesterol Cal: 40 mg/dL (ref 5–40)

## 2022-07-19 LAB — TSH: TSH: 2.74 u[IU]/mL (ref 0.450–4.500)

## 2022-07-19 LAB — URIC ACID: Uric Acid: 5.6 mg/dL (ref 3.8–8.4)

## 2022-07-19 LAB — PSA: Prostate Specific Ag, Serum: 0.4 ng/mL (ref 0.0–4.0)

## 2022-07-20 ENCOUNTER — Encounter: Payer: Self-pay | Admitting: Family Medicine

## 2022-07-21 ENCOUNTER — Encounter: Payer: Self-pay | Admitting: Dermatology

## 2022-07-22 ENCOUNTER — Encounter: Payer: Self-pay | Admitting: Dermatology

## 2022-07-23 ENCOUNTER — Encounter: Payer: Self-pay | Admitting: Dermatology

## 2022-07-25 ENCOUNTER — Encounter: Payer: Self-pay | Admitting: Dermatology

## 2022-07-25 ENCOUNTER — Ambulatory Visit (INDEPENDENT_AMBULATORY_CARE_PROVIDER_SITE_OTHER): Payer: Medicaid Other | Admitting: Dermatology

## 2022-07-25 DIAGNOSIS — L209 Atopic dermatitis, unspecified: Secondary | ICD-10-CM

## 2022-07-25 MED ORDER — TRALOKINUMAB-LDRM 150 MG/ML ~~LOC~~ SOSY
300.0000 mg | PREFILLED_SYRINGE | Freq: Once | SUBCUTANEOUS | Status: AC
  Administered 2022-07-25: 300 mg via SUBCUTANEOUS

## 2022-07-25 NOTE — Progress Notes (Signed)
Patient here today for Adbry injections for severe atopic dermatitis.    Adbry '150mg'$ /mL syringe X 2 injected into left arm. Patient tolerated well.   LOT: [id number] EXP: 09/2023  Dicie Beam RMA    Documentation: I have reviewed the above documentation for accuracy and completeness, and I agree with the above.  [name], MD

## 2022-07-29 ENCOUNTER — Encounter: Payer: Self-pay | Admitting: Dermatology

## 2022-07-30 ENCOUNTER — Encounter: Payer: Self-pay | Admitting: Dermatology

## 2022-08-12 ENCOUNTER — Encounter: Payer: Self-pay | Admitting: Dermatology

## 2022-08-13 ENCOUNTER — Encounter: Payer: Self-pay | Admitting: Dermatology

## 2022-08-14 ENCOUNTER — Encounter: Payer: Self-pay | Admitting: Family Medicine

## 2022-08-14 ENCOUNTER — Encounter: Payer: Self-pay | Admitting: Dermatology

## 2022-08-14 DIAGNOSIS — L2089 Other atopic dermatitis: Secondary | ICD-10-CM

## 2022-08-15 MED ORDER — OPZELURA 1.5 % EX CREA: TOPICAL_CREAM | CUTANEOUS | 5 refills | Status: DC

## 2022-08-18 ENCOUNTER — Encounter: Payer: Self-pay | Admitting: Dermatology

## 2022-08-19 ENCOUNTER — Other Ambulatory Visit: Payer: Self-pay | Admitting: Dermatology

## 2022-08-19 DIAGNOSIS — L239 Allergic contact dermatitis, unspecified cause: Secondary | ICD-10-CM

## 2022-08-20 ENCOUNTER — Other Ambulatory Visit: Payer: Self-pay | Admitting: Family Medicine

## 2022-08-20 ENCOUNTER — Encounter: Payer: Self-pay | Admitting: Family Medicine

## 2022-08-20 MED ORDER — MONTELUKAST SODIUM 10 MG PO TABS
10.0000 mg | ORAL_TABLET | Freq: Every day | ORAL | 1 refills | Status: DC
Start: 2022-08-20 — End: 2023-01-25

## 2022-08-21 NOTE — Telephone Encounter (Signed)
Duplicate request- filled yetserday Requested Prescriptions  Pending Prescriptions Disp Refills  . montelukast (SINGULAIR) 10 MG tablet [Pharmacy Med Name: Montelukast Sodium 10 MG Oral Tablet] 90 tablet 0    Sig: TAKE 1 TABLET BY MOUTH AT BEDTIME     Pulmonology:  Leukotriene Inhibitors Passed - 08/20/2022  3:16 PM      Passed - Valid encounter within last 12 months    Recent Outpatient Visits          1 month ago Routine general medical examination at a health care facility   [facility], [place], DO   6 months ago Primary hypertension   [place], DO   7 months ago Chronic gout of multiple sites, unspecified cause   [place], Megan P, DO   10 months ago Mixed hyperlipidemia   [employer], [place], DO   1 year ago Bilateral foot pain   [place], [place], DO      Future Appointments            In 1 month Johnson, Barb Merino, DO [employer], [place]   In 2 months [place], [place], MD [place]

## 2022-08-21 NOTE — Telephone Encounter (Signed)
Please offer patient an appointment to check the sore this week. Thank you!

## 2022-08-22 ENCOUNTER — Ambulatory Visit: Payer: Medicaid Other | Admitting: Podiatry

## 2022-08-22 ENCOUNTER — Ambulatory Visit (INDEPENDENT_AMBULATORY_CARE_PROVIDER_SITE_OTHER): Payer: Medicaid Other

## 2022-08-22 ENCOUNTER — Encounter: Payer: Self-pay | Admitting: Podiatry

## 2022-08-22 ENCOUNTER — Telehealth: Payer: Self-pay | Admitting: Podiatry

## 2022-08-22 DIAGNOSIS — M779 Enthesopathy, unspecified: Secondary | ICD-10-CM | POA: Diagnosis not present

## 2022-08-22 DIAGNOSIS — M7671 Peroneal tendinitis, right leg: Secondary | ICD-10-CM

## 2022-08-22 MED ORDER — METHYLPREDNISOLONE 4 MG PO TBPK: ORAL_TABLET | ORAL | 0 refills | Status: DC

## 2022-08-22 NOTE — Telephone Encounter (Signed)
Pt was calling regarding his medication wanting to know if it has been sent yet?  Please advise

## 2022-08-22 NOTE — Patient Instructions (Signed)
Look for Voltaren gel at the pharmacy over the counter or online (also known as diclofenac 1% gel). Apply to the painful areas 3-4x daily with the supplied dosing card. Allow to dry for 10 minutes before going into socks/shoes   Peroneal Tendinopathy Rehab Ask your health care provider which exercises are safe for you. Do exercises exactly as told by your health care provider and adjust them as directed. It is normal to feel mild stretching, pulling, tightness, or discomfort as you do these exercises. Stop right away if you feel sudden pain or your pain gets worse. Do not begin these exercises until told by your health care provider. Stretching and range-of-motion exercises These exercises warm up your muscles and joints and improve the movement and flexibility of your ankle. These exercises also help to relieve pain and stiffness. Gastroc and soleus stretch, standing  This is an exercise in which you stand on a step and use your body weight to stretch your calf muscles. To do this exercise: Stand on the edge of a step on the ball of your left / right foot. The ball of your foot is on the walking surface, right under your toes. Keep your other foot firmly on the same step. Hold on to the wall, a railing, or a chair for balance. Slowly lift your other foot, allowing your body weight to press your left / right heel down over the edge of the step. You should feel a stretch in your left / right calf (gastrocnemius and soleus). Hold this position for 15 seconds. Return both feet to the step. Repeat this exercise with a slight bend in your left / right knee. Repeat 5 times with your left / right knee straight and 5 times with your left / right knee bent. Complete this exercise 2 times a day. Strengthening exercises These exercises build strength and endurance in your foot and ankle. Endurance is the ability to use your muscles for a long time, even after they get tired. Ankle dorsiflexion with  band   Secure a rubber exercise band or tube to an object, such as a table leg, that will not move when the band is pulled. Secure the other end of the band around your left / right foot. Sit on the floor, facing the object with your left / right leg extended. The band or tube should be slightly tense when your foot is relaxed. Slowly flex your left / right ankle and toes to bring your foot toward you (dorsiflexion). Hold this position for 15 seconds. Let the band or tube slowly pull your foot back to the starting position. Repeat 5 times. Complete this exercise 2 times a day. Ankle eversion Sit on the floor with your legs straight out in front of you. Loop a rubber exercise band or tube around the ball of your left / right foot. The ball of your foot is on the walking surface, right under your toes. Hold the ends of the band in your hands, or secure the band to a stable object. The band or tube should be slightly tense when your foot is relaxed. Slowly push your foot outward, away from your other leg (eversion). Hold this position for 15 seconds. Slowly return your foot to the starting position. Repeat 5 times. Complete this exercise 2 times a day. Plantar flexion, standing  This exercise is sometimes called standing heel raise. Stand with your feet shoulder-width apart. Place your hands on a wall or table to steady yourself as   needed, but try not to use it for support. Keep your weight spread evenly over the width of your feet while you slowly rise up on your toes (plantar flexion). If told by your health care provider: Shift your weight toward your left / right leg until you feel challenged. Stand on your left / right leg only. Hold this position for 15 seconds. Repeat 2 times. Complete this exercise 2 times a day. Single leg stand Without shoes, stand near a railing or in a doorway. You may hold on to the railing or door frame as needed. Stand on your left / right foot. Keep your  big toe down on the floor and try to keep your arch lifted. Do not roll to the outside of your foot. If this exercise is too easy, you can try it with your eyes closed or while standing on a pillow. Hold this position for 15 seconds. Repeat 5 times. Complete this exercise 2 times a day. This information is not intended to replace advice given to you by your health care provider. Make sure you discuss any questions you have with your health care provider. Document Revised: 04/07/2019 Document Reviewed: 04/07/2019 Elsevier Patient Education  2020 [employer]  

## 2022-08-23 ENCOUNTER — Encounter: Payer: Self-pay | Admitting: Podiatry

## 2022-08-23 NOTE — Progress Notes (Signed)
  Subjective:  Patient ID: Jesus Guerrero, male    DOB: 02/04/1971,  MRN: [id number]  Chief Complaint  Patient presents with   Foot Pain    "My foot is hurting on the side and my ankle." N - side of foot and ankle pain L - lateral right and ankle right D - 2 weeks O - suddenly C - sore, sharp pains, feels swollen A - walking T - Tylenol    51 y.o. male presents with the above complaint. History confirmed with patient.   Objective:  Physical Exam: warm, good capillary refill, no trophic changes or ulcerative lesions, normal DP and PT pulses, normal sensory exam, and pain on palpation to the peroneal tendons at the retromalleolar groove extending distally to the insertion of the base of the fifth metatarsal no ecchymosis no bruising no instability.  Good 5 out of 5 strength in eversion   Radiographs: Multiple views x-ray of the right foot:  Evidence of prior fifth metatarsal fracture noted, he has a mild pes cavus foot type, enthesopathy at the fifth metatarsal base Assessment:   1. Peroneal tendinitis of right lower extremity      Plan:  Patient was evaluated and treated and all questions answered.  Discussed the etiology and treatment options for Achilles tendinitis including stretching, formal physical therapy with an eccentric exercises therapy plan, supportive shoegears such as a running shoe or sneaker, heel lifts, topical and oral medications.  We also discussed that I do not routinely perform injections in this area because of the risk of an increased damage or rupture of the tendon.  We also discussed the role of surgical treatment of this for patients who do not improve after exhausting non-surgical treatment options.  -XR reviewed with patient -Educated on stretching and icing of the affected limb. -Rx for Medrol 6-day taper. Advised on risks, benefits, and alternatives of the medication -Recommended Voltaren gel  Return if symptoms worsen or fail to improve.

## 2022-08-24 ENCOUNTER — Encounter: Payer: Self-pay | Admitting: Dermatology

## 2022-08-27 ENCOUNTER — Ambulatory Visit: Payer: Medicaid Other

## 2022-08-28 ENCOUNTER — Encounter: Payer: Self-pay | Admitting: Dermatology

## 2022-08-28 ENCOUNTER — Other Ambulatory Visit: Payer: Self-pay | Admitting: Dermatology

## 2022-08-28 DIAGNOSIS — L209 Atopic dermatitis, unspecified: Secondary | ICD-10-CM

## 2022-08-29 ENCOUNTER — Ambulatory Visit: Payer: Medicaid Other | Admitting: Dermatology

## 2022-08-29 ENCOUNTER — Encounter: Payer: Self-pay | Admitting: Dermatology

## 2022-08-29 DIAGNOSIS — L98499 Non-pressure chronic ulcer of skin of other sites with unspecified severity: Secondary | ICD-10-CM

## 2022-08-29 DIAGNOSIS — L209 Atopic dermatitis, unspecified: Secondary | ICD-10-CM | POA: Diagnosis not present

## 2022-08-29 MED ORDER — MUPIROCIN 2 % EX OINT: 1.0000 | TOPICAL_OINTMENT | Freq: Two times a day (BID) | CUTANEOUS | 11 refills | Status: DC

## 2022-08-29 MED ORDER — TRALOKINUMAB-LDRM 150 MG/ML ~~LOC~~ SOSY
300.0000 mg | PREFILLED_SYRINGE | Freq: Once | SUBCUTANEOUS | Status: AC
  Administered 2022-08-29: 300 mg via SUBCUTANEOUS

## 2022-08-29 NOTE — Patient Instructions (Addendum)
For ulcers and wounds -start mupirocin daily and cover with a bandaid. For scalp wound - apply mupirocin several times a day. Avoid picking   Recommend N-acetylcysteine (NAC) 600 mg supplement three times per day to help with wounds   Due to recent changes in healthcare laws, you may see results of your pathology and/or laboratory studies on MyChart before the doctors have had a chance to review them. We understand that in some cases there may be results that are confusing or concerning to you. Please understand that not all results are received at the same time and often the doctors may need to interpret multiple results in order to provide you with the best plan of care or course of treatment. Therefore, we ask that you please give [place] 2 business days to thoroughly review all your results before contacting the office for clarification. Should we see a critical lab result, you will be contacted sooner.   If You Need Anything After Your Visit  If you have any questions or concerns for your doctor, please call our main line at [phone] and press option 4 to reach your doctor's medical assistant. If no one answers, please leave a voicemail as directed and we will return your call as soon as possible. Messages left after 4 pm will be answered the following business day.   You may also send [place] a message via [place]. We typically respond to MyChart messages within 1-2 business days.  For prescription refills, please ask your pharmacy to contact our office. Our fax number is [phone].  If you have an urgent issue when the clinic is closed that cannot wait until the next business day, you can page your doctor at the number below.    Please note that while we do our best to be available for urgent issues outside of office hours, we are not available 24/7.   If you have an urgent issue and are unable to reach [place], you may choose to seek medical care at your doctor's office, retail clinic, urgent  care center, or emergency room.  If you have a medical emergency, please immediately call 911 or go to the emergency department.  Pager Numbers  - Dr. Nehemiah Massed: [phone]  - Dr. Laurence Ferrari: [phone]  - Dr. Nicole Kindred: [phone]  In the event of inclement weather, please call our main line at [phone] for an update on the status of any delays or closures.  Dermatology Medication Tips: Please keep the boxes that topical medications come in in order to help keep track of the instructions about where and how to use these. Pharmacies typically print the medication instructions only on the boxes and not directly on the medication tubes.   If your medication is too expensive, please contact our office at [phone] option 4 or send [place] a message through [place].   We are unable to tell what your co-pay for medications will be in advance as this is different depending on your insurance coverage. However, we may be able to find a substitute medication at lower cost or fill out paperwork to get insurance to cover a needed medication.   If a prior authorization is required to get your medication covered by your insurance company, please allow [place] 1-2 business days to complete this process.  Drug prices often vary depending on where the prescription is filled and some pharmacies may offer cheaper prices.  The website www.goodrx.com contains coupons for medications through different pharmacies. The prices here do not account for what  cost may be with help from insurance (it may be cheaper with your insurance), but the website can give you the price if you did not use any insurance.  - You can print the associated coupon and take it with your prescription to the pharmacy.  - You may also stop by our office during regular business hours and pick up a GoodRx coupon card.  - If you need your prescription sent electronically to a different pharmacy, notify our office through [place] MyChart or by  phone at [phone] option 4.     Si Usted Necesita Algo Despus de Su Visita  Tambin puede enviarnos un mensaje a travs de MyChart. Por lo general respondemos a los mensajes de MyChart en el transcurso de 1 a 2 das hbiles.  Para renovar recetas, por favor pida a su farmacia que se ponga en contacto con nuestra oficina. Nuestro nmero de fax es el [phone].  Si tiene un asunto urgente cuando la clnica est cerrada y que no puede esperar hasta el siguiente da hbil, puede llamar/localizar a su doctor(a) al nmero que aparece a continuacin.   Por favor, tenga en cuenta que aunque hacemos todo lo posible para estar disponibles para asuntos urgentes fuera del horario de oficina, no estamos disponibles las 24 horas del da, los 7 das de la semana.   Si tiene un problema urgente y no puede comunicarse con nosotros, puede optar por buscar atencin mdica  en el consultorio de su doctor(a), en una clnica privada, en un centro de atencin urgente o en una sala de emergencias.  Si tiene una emergencia mdica, por favor llame inmediatamente al 911 o vaya a la sala de emergencias.  Nmeros de bper  - Dr. Kowalski: [phone]  - Dra. Moye: [phone]  - Dra. Stewart: [phone]  En caso de inclemencias del tiempo, por favor llame a nuestra lnea principal al [phone] para una actualizacin sobre el estado de cualquier retraso o cierre.  Consejos para la medicacin en dermatologa: Por favor, guarde las cajas en las que vienen los medicamentos de uso tpico para ayudarle a seguir las instrucciones sobre dnde y cmo usarlos. Las farmacias generalmente imprimen las instrucciones del medicamento slo en las cajas y no directamente en los tubos del medicamento.   Si su medicamento es muy caro, por favor, pngase en contacto con nuestra oficina llamando al [phone] y presione la opcin 4 o envenos un mensaje a travs de MyChart.   No podemos decirle cul ser su copago  por los medicamentos por adelantado ya que esto es diferente dependiendo de la cobertura de su seguro. Sin embargo, es posible que podamos encontrar un medicamento sustituto a menor costo o llenar un formulario para que el seguro cubra el medicamento que se considera necesario.   Si se requiere una autorizacin previa para que su compaa de seguros cubra su medicamento, por favor permtanos de 1 a 2 das hbiles para completar este proceso.  Los precios de los medicamentos varan con frecuencia dependiendo del lugar de dnde se surte la receta y alguna farmacias pueden ofrecer precios ms baratos.  El sitio web www.goodrx.com tiene cupones para medicamentos de diferentes farmacias. Los precios aqu no tienen en cuenta lo que podra costar con la ayuda del seguro (puede ser ms barato con su seguro), pero el sitio web puede darle el precio si no utiliz ningn seguro.  - Puede imprimir el cupn correspondiente y llevarlo con su receta a la farmacia.  - Tambin puede pasar por   por nuestra oficina durante el horario de atencin regular y [employer] fundraiser una tarjeta de cupones de GoodRx.  - Si necesita que su receta se enve electrnicamente a una farmacia diferente, informe a nuestra oficina a travs de MyChart de [place] o por telfono llamando al [phone] y presione la opcin 4.

## 2022-08-29 NOTE — Progress Notes (Signed)
   Follow-Up Visit   Subjective  Jesus Guerrero is a 51 y.o. male who presents for the following: Follow-up (Still itchy at scalp at back, hands have improved with itch. Patient reports sore at forehead and hands. ).   The following portions of the chart were reviewed this encounter and updated as appropriate:  Tobacco  Allergies  Meds  Problems  Med Hx  Surg Hx  Fam Hx      Review of Systems: No other skin or systemic complaints except as noted in HPI or Assessment and Plan.   Objective  Well appearing patient in no apparent distress; mood and affect are within normal limits.  A focused examination was performed including face, hands, arms, scalp. Relevant physical exam findings are noted in the Assessment and Plan.  hands, scalp, back, face Scaly pink patches at hands, back   Assessment & Plan  Atopic dermatitis, unspecified type hands, scalp, back, face  Chronic and persistent condition with duration or expected duration over one year. Condition is symptomatic/ bothersome to patient. Not currently at goal.  Pt denies history of heart disease, clots. Has a history of high cholesterol which he says has been doing well. Reviewed risk of heart disease, stroke, clots with rinvoq. Reviewed risk of infections.  Patient interested. However, patient cannot swallow pills and rinvoq cannot be crushed since it is extended release. Cibinqo is the same, so unfortunately neither are an option.  Atopic dermatitis (eczema) is a chronic, relapsing, pruritic condition that can significantly affect quality of life. It is often associated with allergic rhinitis and/or asthma and can require treatment with topical medications, phototherapy, or in severe cases biologic injectable medication (Dupixent; Adbry) or Oral JAK inhibitors.  Continue Adbry '300mg'$ /mL SQ Q4 wks Adbry 300 mg/mL injected today to right upper arm. Patient tolerated well.   Use elidel twice daily to affected areas as  needed  Use clobetasol once to twice a day as needed for up to 2 weeks only to stubborn areas.  Apply mupirocin daily and cover to any open areas  Could consider adding low dose methotrexate if patient continues to have inadequate control.  mupirocin ointment (BACTROBAN) 2 % - hands, scalp, back, face Apply 1 Application topically 2 (two) times daily. Apply to any open wounds and cover with bandage daily until healed. Refer to printout  Related Medications Clobetasol Prop Emollient Base 0.05 % emollient cream Apply twice a day to affected areas itch for two weeks  ELIDEL 1 % cream Apply topically 2 (two) times daily.  Tralokinumab-ldrm (ADBRY) 150 MG/ML SOSY Inject 2 mLs (300 mg total) into the skin every 14 (fourteen) days.  mupirocin ointment (BACTROBAN) 2 % APPLY OINTMENT TOPICALLY TO AFFECTED AREA ONCE DAILY  Tralokinumab-ldrm SOSY 300 mg   Superficial ulcer (HCC) Left Forehead  Recommend mupirocin and covering with bandage until healed.      Return for 4 - 6 week atopic derm follow up. I, [name], CMA, am acting as scribe for [name], MD.  Documentation: I have reviewed the above documentation for accuracy and completeness, and I agree with the above.  [name], MD

## 2022-09-04 ENCOUNTER — Other Ambulatory Visit: Payer: Self-pay

## 2022-09-04 ENCOUNTER — Encounter: Payer: Self-pay | Admitting: Family Medicine

## 2022-09-04 MED ORDER — CLOBETASOL PROPIONATE 0.05 % EX OINT: TOPICAL_OINTMENT | CUTANEOUS | 0 refills | Status: DC

## 2022-09-09 ENCOUNTER — Encounter: Payer: Self-pay | Admitting: Dermatology

## 2022-09-10 ENCOUNTER — Encounter: Payer: Self-pay | Admitting: Dermatology

## 2022-09-14 ENCOUNTER — Other Ambulatory Visit: Payer: Self-pay | Admitting: Dermatology

## 2022-09-14 DIAGNOSIS — L209 Atopic dermatitis, unspecified: Secondary | ICD-10-CM

## 2022-09-14 DIAGNOSIS — L2089 Other atopic dermatitis: Secondary | ICD-10-CM

## 2022-09-23 ENCOUNTER — Encounter: Payer: Self-pay | Admitting: Family Medicine

## 2022-09-24 ENCOUNTER — Encounter: Payer: Self-pay | Admitting: Family Medicine

## 2022-09-24 NOTE — Telephone Encounter (Signed)
Patient schedule for an in office visit.

## 2022-09-25 ENCOUNTER — Other Ambulatory Visit: Payer: Self-pay | Admitting: Dermatology

## 2022-09-25 ENCOUNTER — Ambulatory Visit: Payer: Medicaid Other | Admitting: Nurse Practitioner

## 2022-09-25 DIAGNOSIS — L239 Allergic contact dermatitis, unspecified cause: Secondary | ICD-10-CM

## 2022-09-25 DIAGNOSIS — L219 Seborrheic dermatitis, unspecified: Secondary | ICD-10-CM

## 2022-09-26 ENCOUNTER — Other Ambulatory Visit: Payer: Self-pay | Admitting: Dermatology

## 2022-09-26 ENCOUNTER — Encounter: Payer: Self-pay | Admitting: Dermatology

## 2022-09-26 MED ORDER — TRIAMCINOLONE ACETONIDE 0.1 % MT PSTE
1.0000 | PASTE | Freq: Two times a day (BID) | OROMUCOSAL | 2 refills | Status: DC
Start: 2022-09-26 — End: 2024-02-03

## 2022-09-28 ENCOUNTER — Encounter: Payer: Self-pay | Admitting: Family Medicine

## 2022-09-30 ENCOUNTER — Encounter: Payer: Self-pay | Admitting: Dermatology

## 2022-10-01 ENCOUNTER — Encounter: Payer: Self-pay | Admitting: Dermatology

## 2022-10-01 NOTE — Telephone Encounter (Signed)
appt

## 2022-10-03 ENCOUNTER — Ambulatory Visit: Payer: Medicaid Other | Admitting: Dermatology

## 2022-10-03 ENCOUNTER — Encounter: Payer: Self-pay | Admitting: Dermatology

## 2022-10-07 ENCOUNTER — Encounter: Payer: Self-pay | Admitting: Dermatology

## 2022-10-14 ENCOUNTER — Encounter: Payer: Self-pay | Admitting: Podiatry

## 2022-10-14 ENCOUNTER — Encounter: Payer: Self-pay | Admitting: Dermatology

## 2022-10-14 ENCOUNTER — Encounter: Payer: Self-pay | Admitting: Family Medicine

## 2022-10-16 ENCOUNTER — Ambulatory Visit: Payer: Medicaid Other | Admitting: Family Medicine

## 2022-10-18 ENCOUNTER — Ambulatory Visit: Payer: Medicaid Other | Admitting: Family Medicine

## 2022-10-20 ENCOUNTER — Other Ambulatory Visit: Payer: Self-pay | Admitting: Dermatology

## 2022-10-20 DIAGNOSIS — L2089 Other atopic dermatitis: Secondary | ICD-10-CM

## 2022-10-22 ENCOUNTER — Ambulatory Visit: Payer: Medicaid Other | Admitting: Family Medicine

## 2022-10-22 ENCOUNTER — Encounter: Payer: Self-pay | Admitting: Family Medicine

## 2022-10-23 ENCOUNTER — Encounter: Payer: Self-pay | Admitting: Family Medicine

## 2022-10-24 ENCOUNTER — Encounter: Payer: Self-pay | Admitting: Family Medicine

## 2022-10-24 ENCOUNTER — Ambulatory Visit (INDEPENDENT_AMBULATORY_CARE_PROVIDER_SITE_OTHER): Payer: Medicaid Other | Admitting: Family Medicine

## 2022-10-24 VITALS — BP 135/76 | HR 89 | Temp 97.9°F | Wt 196.8 lb

## 2022-10-24 DIAGNOSIS — I10 Essential (primary) hypertension: Secondary | ICD-10-CM

## 2022-10-24 DIAGNOSIS — M1A09X0 Idiopathic chronic gout, multiple sites, without tophus (tophi): Secondary | ICD-10-CM | POA: Diagnosis not present

## 2022-10-24 DIAGNOSIS — E782 Mixed hyperlipidemia: Secondary | ICD-10-CM | POA: Diagnosis not present

## 2022-10-24 DIAGNOSIS — L609 Nail disorder, unspecified: Secondary | ICD-10-CM

## 2022-10-24 DIAGNOSIS — Z23 Encounter for immunization: Secondary | ICD-10-CM

## 2022-10-24 NOTE — Patient Instructions (Signed)
Tomorrow 10/24/22 3:45 Podiatry Dr. Posey Pronto [address], [place], [place] [address] Phone: [phone]

## 2022-10-24 NOTE — Assessment & Plan Note (Signed)
Under good control on current regimen. Continue current regimen. Continue to monitor. Call with any concerns. Refills up to date. Labs checked last visit.   

## 2022-10-24 NOTE — Progress Notes (Signed)
BP 135/76   Pulse 89   Temp 97.9 F (36.6 C)   Wt 196 lb 12.8 oz (89.3 kg)   SpO2 98%   BMI 33.78 kg/m    Subjective:    Patient ID: Jesus Guerrero, male    DOB: 1971/07/13, 51 y.o.   MRN: [id number]  HPI: Jesus Guerrero is a 51 y.o. male  Chief Complaint  Patient presents with   Hypertension   Hyperlipidemia   Gout   Nail Problem    Patient states his toenail on the second toe, left foot fell off. Area is bleeding, patient states he has not called his podiatrist yet due it being hard to get in.   HYPERTENSION / HYPERLIPIDEMIA Satisfied with current treatment? yes Duration of hypertension: chronic BP monitoring frequency: not checking BP medication side effects: no Past BP meds: lisinopril Duration of hyperlipidemia: chronic Cholesterol medication side effects: no Cholesterol supplements: none Past cholesterol medications: atorvastatin Medication compliance: excellent compliance Aspirin: no Recent stressors: no Recurrent headaches: no Visual changes: no Palpitations: no Dyspnea: no Chest pain: no Lower extremity edema: no Dizzy/lightheaded: no  No gout flares. Tolerating medicine well.   Lost a toenail on his foot. Bleeding and hurting. Did not call his podiatrist.   Relevant past medical, surgical, family and social history reviewed and updated as indicated. Interim medical history since our last visit reviewed. Allergies and medications reviewed and updated.  Review of Systems  Constitutional: Negative.   Respiratory: Negative.    Cardiovascular: Negative.   Gastrointestinal: Negative.   Genitourinary: Negative.   Musculoskeletal: Negative.   Neurological: Negative.   Psychiatric/Behavioral: Negative.      Per HPI unless specifically indicated above     Objective:    BP 135/76   Pulse 89   Temp 97.9 F (36.6 C)   Wt 196 lb 12.8 oz (89.3 kg)   SpO2 98%   BMI 33.78 kg/m   Wt Readings from Last 3 Encounters:  10/24/22 196 lb 12.8 oz  (89.3 kg)  07/18/22 192 lb (87.1 kg)  02/06/22 196 lb (88.9 kg)    Physical Exam Vitals and nursing note reviewed.  Constitutional:      General: He is not in acute distress.    Appearance: Normal appearance. He is not ill-appearing, toxic-appearing or diaphoretic.  HENT:     Head: Normocephalic and atraumatic.     Right Ear: External ear normal.     Left Ear: External ear normal.     Nose: Nose normal.     Mouth/Throat:     Mouth: Mucous membranes are moist.     Pharynx: Oropharynx is clear.  Eyes:     General: No scleral icterus.       Right eye: No discharge.        Left eye: No discharge.     Extraocular Movements: Extraocular movements intact.     Conjunctiva/sclera: Conjunctivae normal.     Pupils: Pupils are equal, round, and reactive to light.  Cardiovascular:     Rate and Rhythm: Normal rate and regular rhythm.     Pulses: Normal pulses.     Heart sounds: Normal heart sounds. No murmur heard.    No friction rub. No gallop.  Pulmonary:     Effort: Pulmonary effort is normal. No respiratory distress.     Breath sounds: Normal breath sounds. No stridor. No wheezing, rhonchi or rales.  Chest:     Chest wall: No tenderness.  Musculoskeletal:  General: Normal range of motion.     Cervical back: Normal range of motion and neck supple.  Skin:    General: Skin is warm and dry.     Capillary Refill: Capillary refill takes less than 2 seconds.     Coloration: Skin is not jaundiced or pale.     Findings: No bruising, erythema, lesion or rash.  Neurological:     General: No focal deficit present.     Mental Status: He is alert and oriented to person, place, and time. Mental status is at baseline.  Psychiatric:        Mood and Affect: Mood normal.        Behavior: Behavior normal.        Thought Content: Thought content normal.        Judgment: Judgment normal.     Results for orders placed or performed in visit on 07/18/22  Microscopic Examination   Urine   Result Value Ref Range   WBC, UA 6-10 (A) 0 - 5 /hpf   RBC, Urine 3-10 (A) 0 - 2 /hpf   Epithelial Cells (non renal) None seen 0 - 10 /hpf   Mucus, UA Present (A) Not Estab.   Bacteria, UA None seen None seen/Few  Comprehensive metabolic panel  Result Value Ref Range   Glucose 105 (H) 70 - 99 mg/dL   BUN 22 6 - 24 mg/dL   Creatinine, Ser 1.15 0.76 - 1.27 mg/dL   eGFR 77 >59 mL/min/1.73   BUN/Creatinine Ratio 19 9 - 20   Sodium 137 134 - 144 mmol/L   Potassium 4.6 3.5 - 5.2 mmol/L   Chloride 102 96 - 106 mmol/L   CO2 20 20 - 29 mmol/L   Calcium 9.1 8.7 - 10.2 mg/dL   Total Protein 7.2 6.0 - 8.5 g/dL   Albumin 4.7 3.8 - 4.9 g/dL   Globulin, Total 2.5 1.5 - 4.5 g/dL   Albumin/Globulin Ratio 1.9 1.2 - 2.2   Bilirubin Total 0.9 0.0 - 1.2 mg/dL   Alkaline Phosphatase 118 44 - 121 IU/L   AST 20 0 - 40 IU/L   ALT 18 0 - 44 IU/L  CBC with Differential/Platelet  Result Value Ref Range   WBC 9.1 3.4 - 10.8 x10E3/uL   RBC 4.90 4.14 - 5.80 x10E6/uL   Hemoglobin 14.8 13.0 - 17.7 g/dL   Hematocrit 42.9 37.5 - 51.0 %   MCV 88 79 - 97 fL   MCH 30.2 26.6 - 33.0 pg   MCHC 34.5 31.5 - 35.7 g/dL   RDW 12.4 11.6 - 15.4 %   Platelets 259 150 - 450 x10E3/uL   Neutrophils 74 Not Estab. %   Lymphs 20 Not Estab. %   Monocytes 5 Not Estab. %   Eos 1 Not Estab. %   Basos 0 Not Estab. %   Neutrophils Absolute 6.6 1.4 - 7.0 x10E3/uL   Lymphocytes Absolute 1.8 0.7 - 3.1 x10E3/uL   Monocytes Absolute 0.5 0.1 - 0.9 x10E3/uL   EOS (ABSOLUTE) 0.1 0.0 - 0.4 x10E3/uL   Basophils Absolute 0.0 0.0 - 0.2 x10E3/uL   Immature Granulocytes 0 Not Estab. %   Immature Grans (Abs) 0.0 0.0 - 0.1 x10E3/uL  Lipid Panel w/o Chol/HDL Ratio  Result Value Ref Range   Cholesterol, Total 155 100 - 199 mg/dL   Triglycerides 244 (H) 0 - 149 mg/dL   HDL 39 (L) >39 mg/dL   VLDL Cholesterol Cal 40 5 - 40 mg/dL  LDL Chol Calc (NIH) 76 0 - 99 mg/dL  PSA  Result Value Ref Range   Prostate Specific Ag, Serum 0.4 0.0 -  4.0 ng/mL  TSH  Result Value Ref Range   TSH 2.740 0.450 - 4.500 uIU/mL  Urinalysis, Routine w reflex microscopic  Result Value Ref Range   Specific Gravity, UA 1.010 1.005 - 1.030   pH, UA 6.0 5.0 - 7.5   Color, UA Yellow Yellow   Appearance Ur Clear Clear   Leukocytes,UA 1+ (A) Negative   Protein,UA Negative Negative/Trace   Glucose, UA Negative Negative   Ketones, UA Negative Negative   RBC, UA 1+ (A) Negative   Bilirubin, UA Negative Negative   Urobilinogen, Ur 0.2 0.2 - 1.0 mg/dL   Nitrite, UA Negative Negative   Microscopic Examination See below:   Microalbumin, Urine Waived  Result Value Ref Range   Microalb, Ur Waived 10 0 - 19 mg/L   Creatinine, Urine Waived 50 10 - 300 mg/dL   Microalb/Creat Ratio <30 <30 mg/g  Uric acid  Result Value Ref Range   Uric Acid 5.6 3.8 - 8.4 mg/dL   *Note: Due to a large number of results and/or encounters for the requested time period, some results have not been displayed. A complete set of results can be found in Results Review.      Assessment & Plan:   Problem List Items Addressed This Visit       Cardiovascular and Mediastinum   Primary hypertension    Under good control on current regimen. Continue current regimen. Continue to monitor. Call with any concerns. Refills up to date. Labs checked last visit.          Other   Hyperlipidemia    Under good control on current regimen. Continue current regimen. Continue to monitor. Call with any concerns. Refills up to date. Labs checked last visit.        Chronic gout    Under good control on current regimen. Continue current regimen. Continue to monitor. Call with any concerns. Refills up to date. Labs checked last visit.        Other Visit Diagnoses     Need for influenza vaccination    -  Primary   Relevant Orders   Flu Vaccine QUAD 6+ mos PF IM (Fluarix Quad PF) (Completed)   Nail problem       Appointment scheduled with his podiatrist for tomorrow. Follow up with  them.         Follow up plan: Return in about 3 months (around 01/24/2023) for 6 month follow up with fasting labs.

## 2022-10-25 ENCOUNTER — Ambulatory Visit: Payer: Medicaid Other | Admitting: Podiatry

## 2022-10-25 DIAGNOSIS — S90212A Contusion of left great toe with damage to nail, initial encounter: Secondary | ICD-10-CM

## 2022-10-26 ENCOUNTER — Other Ambulatory Visit: Payer: Self-pay | Admitting: Dermatology

## 2022-10-26 DIAGNOSIS — L219 Seborrheic dermatitis, unspecified: Secondary | ICD-10-CM

## 2022-10-28 ENCOUNTER — Encounter: Payer: Self-pay | Admitting: Dermatology

## 2022-10-28 ENCOUNTER — Encounter: Payer: Self-pay | Admitting: Family Medicine

## 2022-10-29 ENCOUNTER — Encounter: Payer: Self-pay | Admitting: Dermatology

## 2022-10-29 ENCOUNTER — Other Ambulatory Visit: Payer: Self-pay | Admitting: Family Medicine

## 2022-10-29 ENCOUNTER — Encounter: Payer: Self-pay | Admitting: Family Medicine

## 2022-10-30 NOTE — Telephone Encounter (Signed)
Requested medication (s) are due for refill today - yes  Requested medication (s) are on the active medication list -yes  Future visit scheduled -yes  Last refill: 06/18/22 #30 3RF  Notes to clinic: non delegated Rx  Requested Prescriptions  Pending Prescriptions Disp Refills   cyclobenzaprine (FLEXERIL) 10 MG tablet [Pharmacy Med Name: Cyclobenzaprine HCl 10 MG Oral Tablet] 30 tablet 0    Sig: TAKE 1 TABLET BY MOUTH AT BEDTIME     Not Delegated - Analgesics:  Muscle Relaxants Failed - 10/29/2022  6:25 PM      Failed - This refill cannot be delegated      Passed - Valid encounter within last 6 months    Recent Outpatient Visits           6 days ago Need for influenza vaccination   [place], Megan P, DO   3 months ago Routine general medical examination at a health care facility   [facility], [place] P, DO   8 months ago Primary hypertension   [place], Megan P, DO   10 months ago Chronic gout of multiple sites, unspecified cause   [facility] [place], [employer], DO   1 year ago Mixed hyperlipidemia   [facility] [place], Barb Merino, DO       Future Appointments             In 1 week [place], [place], MD [place]   In 2 months [place], Megan P, DO [place], [place]               Requested Prescriptions  Pending Prescriptions Disp Refills   cyclobenzaprine (FLEXERIL) 10 MG tablet [Pharmacy Med Name: Cyclobenzaprine HCl 10 MG Oral Tablet] 30 tablet 0    Sig: TAKE 1 TABLET BY MOUTH AT BEDTIME     Not Delegated - Analgesics:  Muscle Relaxants Failed - 10/29/2022  6:25 PM      Failed - This refill cannot be delegated      Passed - Valid encounter within last 6 months    Recent Outpatient Visits           6 days ago Need for influenza vaccination   [place], Megan P, DO   3 months ago Routine general medical examination at a  health care facility   [facility], [place], DO   8 months ago Primary hypertension   [place], Megan P, DO   10 months ago Chronic gout of multiple sites, unspecified cause   [employer], [place], DO   1 year ago Mixed hyperlipidemia   [facility], Barb Merino, DO       Future Appointments             In 1 week Laurence Ferrari, [place], MD [place]   In 2 months [place], Barb Merino, DO [employer]

## 2022-11-01 ENCOUNTER — Ambulatory Visit: Payer: Medicaid Other | Admitting: Dermatology
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
## 2022-11-06 ENCOUNTER — Encounter: Payer: Self-pay | Admitting: Dermatology

## 2022-11-06 ENCOUNTER — Ambulatory Visit (INDEPENDENT_AMBULATORY_CARE_PROVIDER_SITE_OTHER): Payer: Medicaid Other | Admitting: Dermatology

## 2022-11-06 DIAGNOSIS — D229 Melanocytic nevi, unspecified: Secondary | ICD-10-CM

## 2022-11-06 DIAGNOSIS — L209 Atopic dermatitis, unspecified: Secondary | ICD-10-CM

## 2022-11-06 DIAGNOSIS — L814 Other melanin hyperpigmentation: Secondary | ICD-10-CM

## 2022-11-06 DIAGNOSIS — Z86018 Personal history of other benign neoplasm: Secondary | ICD-10-CM

## 2022-11-06 DIAGNOSIS — L578 Other skin changes due to chronic exposure to nonionizing radiation: Secondary | ICD-10-CM

## 2022-11-06 DIAGNOSIS — S0001XA Abrasion of scalp, initial encounter: Secondary | ICD-10-CM | POA: Diagnosis not present

## 2022-11-06 DIAGNOSIS — S0081XA Abrasion of other part of head, initial encounter: Secondary | ICD-10-CM

## 2022-11-06 DIAGNOSIS — Z1283 Encounter for screening for malignant neoplasm of skin: Secondary | ICD-10-CM | POA: Diagnosis not present

## 2022-11-06 DIAGNOSIS — T148XXA Other injury of unspecified body region, initial encounter: Secondary | ICD-10-CM

## 2022-11-06 DIAGNOSIS — L603 Nail dystrophy: Secondary | ICD-10-CM

## 2022-11-06 DIAGNOSIS — L821 Other seborrheic keratosis: Secondary | ICD-10-CM

## 2022-11-06 MED ORDER — TRALOKINUMAB-LDRM 150 MG/ML ~~LOC~~ SOSY
300.0000 mg | PREFILLED_SYRINGE | Freq: Once | SUBCUTANEOUS | Status: AC
  Administered 2022-11-06: 300 mg via SUBCUTANEOUS

## 2022-11-06 NOTE — Patient Instructions (Addendum)
Use clobetasol once to twice a day as needed for up to 2 weeks only to stubborn areas.  Use elidel twice daily to affected areas as needed  Continue Opzelura twice daily.    Continue Mupirocin and band-aid to toe as directed by podiatrist.   Topical steroids (such as triamcinolone, fluocinolone, fluocinonide, mometasone, clobetasol, halobetasol, betamethasone, hydrocortisone) can cause thinning and lightening of the skin if they are used for too long in the same area. Your physician has selected the right strength medicine for your problem and area affected on the body. Please use your medication only as directed by your physician to prevent side effects.     Recommend taking Heliocare sun protection supplement daily in sunny weather for additional sun protection. For maximum protection on the sunniest days, you can take up to 2 capsules of regular Heliocare OR take 1 capsule of Heliocare Ultra. For prolonged exposure (such as a full day in the sun), you can repeat your dose of the supplement 4 hours after your first dose. Heliocare can be purchased at [employer], at some [facility] or at [email or web address].     Melanoma ABCDEs  Melanoma is the most dangerous type of skin cancer, and is the leading cause of death from skin disease.  You are more likely to develop melanoma if you: Have light-colored skin, light-colored eyes, or red or blond hair Spend a lot of time in the sun Tan regularly, either outdoors or in a tanning bed Have had blistering sunburns, especially during childhood Have a close family member who has had a melanoma Have atypical moles or large birthmarks  Early detection of melanoma is key since treatment is typically straightforward and cure rates are extremely high if we catch it early.   The first sign of melanoma is often a change in a mole or a new dark spot.  The ABCDE system is a way of remembering the signs of melanoma.  A for asymmetry:  The two halves do  not match. B for border:  The edges of the growth are irregular. C for color:  A mixture of colors are present instead of an even brown color. D for diameter:  Melanomas are usually (but not always) greater than 62m - the size of a pencil eraser. E for evolution:  The spot keeps changing in size, shape, and color.  Please check your skin once per month between visits. You can use a small mirror in front and a large mirror behind you to keep an eye on the back side or your body.   If you see any new or changing lesions before your next follow-up, please call to schedule a visit.  Please continue daily skin protection including broad spectrum sunscreen SPF 30+ to sun-exposed areas, reapplying every 2 hours as needed when you're outdoors.   Staying in the shade or wearing long sleeves, sun glasses (UVA+UVB protection) and wide brim hats (4-inch brim around the entire circumference of the hat) are also recommended for sun protection.     Due to recent changes in healthcare laws, you may see results of your pathology and/or laboratory studies on MyChart before the doctors have had a chance to review them. We understand that in some cases there may be results that are confusing or concerning to you. Please understand that not all results are received at the same time and often the doctors may need to interpret multiple results in order to provide you with the best plan of care or  course of treatment. Therefore, we ask that you please give [place] 2 business days to thoroughly review all your results before contacting the office for clarification. Should we see a critical lab result, you will be contacted sooner.   If You Need Anything After Your Visit  If you have any questions or concerns for your doctor, please call our main line at [phone] and press option 4 to reach your doctor's medical assistant. If no one answers, please leave a voicemail as directed and we will return your call as soon as  possible. Messages left after 4 pm will be answered the following business day.   You may also send [place] a message via [place]. We typically respond to MyChart messages within 1-2 business days.  For prescription refills, please ask your pharmacy to contact our office. Our fax number is [phone].  If you have an urgent issue when the clinic is closed that cannot wait until the next business day, you can page your doctor at the number below.    Please note that while we do our best to be available for urgent issues outside of office hours, we are not available 24/7.   If you have an urgent issue and are unable to reach [place], you may choose to seek medical care at your doctor's office, retail clinic, urgent care center, or emergency room.  If you have a medical emergency, please immediately call 911 or go to the emergency department.  Pager Numbers  - Dr. Nehemiah Massed: [phone]  - Dr. Laurence Ferrari: [phone]  - Dr. Nicole Kindred: [phone]  In the event of inclement weather, please call our main line at [phone] for an update on the status of any delays or closures.  Dermatology Medication Tips: Please keep the boxes that topical medications come in in order to help keep track of the instructions about where and how to use these. Pharmacies typically print the medication instructions only on the boxes and not directly on the medication tubes.   If your medication is too expensive, please contact our office at [phone] option 4 or send [place] a message through [place].   We are unable to tell what your co-pay for medications will be in advance as this is different depending on your insurance coverage. However, we may be able to find a substitute medication at lower cost or fill out paperwork to get insurance to cover a needed medication.   If a prior authorization is required to get your medication covered by your insurance company, please allow [place] 1-2 business days to complete this  process.  Drug prices often vary depending on where the prescription is filled and some pharmacies may offer cheaper prices.  The website www.goodrx.com contains coupons for medications through different pharmacies. The prices here do not account for what the cost may be with help from insurance (it may be cheaper with your insurance), but the website can give you the price if you did not use any insurance.  - You can print the associated coupon and take it with your prescription to the pharmacy.  - You may also stop by our office during regular business hours and pick up a GoodRx coupon card.  - If you need your prescription sent electronically to a different pharmacy, notify our office through [facility] or by phone at [phone] option 4.     Si Usted Necesita Algo Despus de Su Visita  Tambin puede enviarnos un mensaje a travs de Pharmacist, community. Por lo general respondemos a  los mensajes de MyChart en el transcurso de 1 a 2 das hbiles.  Para renovar recetas, por favor pida a su farmacia que se ponga en contacto con nuestra oficina. Harland Dingwall de fax es [place] [phone].  Si tiene un asunto urgente cuando la clnica est cerrada y que no puede esperar hasta el siguiente da hbil, puede llamar/localizar a su doctor(a) al nmero que aparece a continuacin.   Por favor, tenga en cuenta que aunque hacemos todo lo posible para estar disponibles para asuntos urgentes fuera del horario de [place], no estamos disponibles las 24 horas del da, los 7 das de la [place].   Si tiene un problema urgente y no puede comunicarse con nosotros, puede optar por buscar atencin mdica  en el consultorio de su doctor(a), en una clnica privada, en un centro de atencin urgente o en una sala de emergencias.  Si tiene Engineering geologist, por favor llame inmediatamente al 911 o vaya a la sala de emergencias.  Nmeros de bper  - Dr. Nehemiah Massed: [phone]  - Dra. Moye: [phone]  - Dra.  Nicole Kindred: [phone]  En caso de inclemencias del [place], por favor llame a Johnsie Kindred principal al [phone] para una actualizacin sobre el [place] de cualquier retraso o cierre.  Consejos para la medicacin en dermatologa: Por favor, guarde las cajas en las que vienen los medicamentos de uso tpico para ayudarle a seguir las instrucciones sobre dnde y cmo usarlos. Las farmacias generalmente imprimen las instrucciones del medicamento slo en las cajas y no directamente en los tubos del [place].   Si su medicamento es muy caro, por favor, pngase en contacto con Zigmund Daniel llamando al [phone] y presione la opcin 4 o envenos un mensaje a travs de Pharmacist, community.   No podemos decirle cul ser su copago por los medicamentos por adelantado ya que esto es diferente dependiendo de la cobertura de su seguro. Sin embargo, es posible que podamos encontrar un medicamento sustituto a Electrical engineer un formulario para que el seguro cubra el medicamento que se considera necesario.   Si se requiere una autorizacin previa para que su compaa de seguros [place] su medicamento, por favor permtanos de 1 a 2 das hbiles para completar este proceso.  Los precios de los medicamentos varan con frecuencia dependiendo del Environmental consultant de dnde se surte la receta y alguna farmacias pueden ofrecer precios ms baratos.  El sitio web www.goodrx.com tiene cupones para medicamentos de Airline pilot. Los precios aqu no tienen en cuenta lo que podra costar con la ayuda del seguro (puede ser ms barato con su seguro), pero el sitio web puede darle el precio si no utiliz Research scientist (physical sciences).  - Puede imprimir el cupn correspondiente y llevarlo con su receta a la farmacia.  - Tambin puede pasar por nuestra oficina durante el horario de atencin regular y Charity fundraiser una tarjeta de cupones de GoodRx.  - Si necesita que su receta se enve electrnicamente a una farmacia diferente, informe a nuestra oficina a  travs de MyChart de [place] o por telfono llamando al [phone] y presione la opcin 4.

## 2022-11-06 NOTE — Progress Notes (Signed)
Follow-Up Visit   Subjective  Jesus Guerrero is a 51 y.o. male who presents for the following: Eczema (8-10 week follow up. On Adbry once a month but has not had injection since 08/29/2022. Using Opzelura twice daily) and Annual Exam (Hx of dysplastic nevi).  The patient presents for Total-Body Skin Exam (TBSE) for skin cancer screening and mole check.  The patient has spots, moles and lesions to be evaluated, some may be new or changing and the patient has concerns that these could be cancer.  The following portions of the chart were reviewed this encounter and updated as appropriate:  Tobacco  Allergies  Meds  Problems  Med Hx  Surg Hx  Fam Hx      Review of Systems: No other skin or systemic complaints except as noted in HPI or Assessment and Plan.   Objective  Well appearing patient in no apparent distress; mood and affect are within normal limits.  A full examination was performed including scalp, head, eyes, ears, nose, lips, neck, chest, axillae, abdomen, back, buttocks, bilateral upper extremities, bilateral lower extremities, hands, feet, fingers, toes, fingernails, and toenails. All findings within normal limits unless otherwise noted below.  hands, scalp, back, face Scaly erythematous papules and plaques   face, scalp Excoriated, ulcerated lesions at face  Left 2nd Toe/toenail Hx of nail avulsion with some honey crusting   Assessment & Plan   History of Dysplastic Nevi. Left helix, mod., 2019. Left calf, mod; left buttock, mild., 2020. - No evidence of recurrence today - Recommend regular full body skin exams - Recommend daily broad spectrum sunscreen SPF 30+ to sun-exposed areas, reapply every 2 hours as needed.  - Call if any new or changing lesions are noted between office visits   Lentigines - Scattered tan macules - Due to sun exposure - Benign-appearing, observe - Recommend daily broad spectrum sunscreen SPF 30+ to sun-exposed areas, reapply every  2 hours as needed. - Call for any changes  Seborrheic Keratoses - Stuck-on, waxy, tan-brown papules and/or plaques  - Benign-appearing - Discussed benign etiology and prognosis. - Observe - Call for any changes  Melanocytic Nevi - Tan-brown and/or pink-flesh-colored symmetric macules and papules - Benign appearing on exam today - Observation - Call clinic for new or changing moles - Recommend daily use of broad spectrum spf 30+ sunscreen to sun-exposed areas.   Hemangiomas - Red papules - Discussed benign nature - Observe - Call for any changes  Actinic Damage - Chronic condition, secondary to cumulative UV/sun exposure - diffuse scaly erythematous macules with underlying dyspigmentation - Recommend daily broad spectrum sunscreen SPF 30+ to sun-exposed areas, reapply every 2 hours as needed.  - Staying in the shade or wearing long sleeves, sun glasses (UVA+UVB protection) and wide brim hats (4-inch brim around the entire circumference of the hat) are also recommended for sun protection.  - Call for new or changing lesions.  Skin cancer screening performed today.   Atopic dermatitis, unspecified type hands, scalp, back, face  Atopic dermatitis (eczema) is a chronic, relapsing, pruritic condition that can significantly affect quality of life. It is often associated with allergic rhinitis and/or asthma and can require treatment with topical medications, phototherapy, or in severe cases biologic injectable medication (Dupixent; Adbry) or Oral JAK inhibitors.  Also with allergic contact dermatitis. He has deferred patch testing with [facility].  Chronic and persistent condition with duration or expected duration over one year. Condition is symptomatic/ bothersome to patient. Not currently at goal.  Pt denies history of heart disease, clots. Has a history of high cholesterol which he says has been doing well. Reviewed risk of heart disease, stroke, clots with rinvoq. Reviewed risk of  infections.  Patient interested. However, patient cannot swallow pills and rinvoq cannot be crushed since it is extended release. Cibinqo is the same, so unfortunately neither are an option.    Continue Adbry '300mg'$ /mL SQ Q4 wks Adbry 300 mg/mL injected today to left upper arm. Patient tolerated well.  [employer] [employer]: 1017-510-25 Lot: [id number] Exp: 09/2023   Continue opzelura twice a day and Adbry. Use clobetasol once to twice a day as needed for up to 2 weeks only to stubborn areas.  Discussed adding Rinvoq or MTX. Patient unable to swallow pill so will check on crushing tablets of Rinvoq.   Counseled on gentle skin care  Related Medications Clobetasol Prop Emollient Base 0.05 % emollient cream Apply twice a day to affected areas itch for two weeks  ELIDEL 1 % cream Apply topically 2 (two) times daily.  Tralokinumab-ldrm (ADBRY) 150 MG/ML SOSY Inject 2 mLs (300 mg total) into the skin every 14 (fourteen) days.  mupirocin ointment (BACTROBAN) 2 % Apply 1 Application topically 2 (two) times daily. Apply to any open wounds and cover with bandage daily until healed. Refer to printout  mupirocin ointment (BACTROBAN) 2 % APPLY  OINTMENT TOPICALLY TO AFFECTED AREA ONCE DAILY  Tralokinumab-ldrm SOSY 300 mg   Excoriation face, scalp  Apply mupirocin ointment twice daily, cover with bandaid, until healed. Avoid picking areas.   Pt has failed NAC, low dose naltrexone, topical OTC anti-itch options. Topical gabapentin not covered by insurance. Consider CBT, oral gabapentin in future.  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    Return in about 2 months (around 01/06/2023) for Atopic Dermatitis, Injection On Nurse Schedule in 1 month (Adbry).  I, Emelia Salisbury, CMA, am acting as scribe for [name], MD.  Documentation: I have reviewed the above documentation for accuracy and completeness, and I agree with the above.  [name], MD

## 2022-11-09 ENCOUNTER — Encounter: Payer: Self-pay | Admitting: Family Medicine

## 2022-11-10 ENCOUNTER — Encounter: Payer: Self-pay | Admitting: Dermatology

## 2022-11-12 ENCOUNTER — Encounter: Payer: Self-pay | Admitting: Dermatology

## 2022-11-13 ENCOUNTER — Other Ambulatory Visit: Payer: Self-pay | Admitting: Dermatology

## 2022-11-13 ENCOUNTER — Encounter: Payer: Self-pay | Admitting: Dermatology

## 2022-11-13 DIAGNOSIS — L209 Atopic dermatitis, unspecified: Secondary | ICD-10-CM

## 2022-11-13 DIAGNOSIS — L2089 Other atopic dermatitis: Secondary | ICD-10-CM

## 2022-11-14 ENCOUNTER — Encounter: Payer: Self-pay | Admitting: Family Medicine

## 2022-11-14 MED ORDER — CLOBETASOL PROPIONATE 0.05 % EX OINT: TOPICAL_OINTMENT | CUTANEOUS | 0 refills | Status: DC

## 2022-11-18 ENCOUNTER — Encounter: Payer: Self-pay | Admitting: Family Medicine

## 2022-11-18 ENCOUNTER — Encounter: Payer: Self-pay | Admitting: Dermatology

## 2022-11-18 ENCOUNTER — Other Ambulatory Visit: Payer: Self-pay | Admitting: Dermatology

## 2022-11-18 DIAGNOSIS — L2089 Other atopic dermatitis: Secondary | ICD-10-CM

## 2022-11-19 ENCOUNTER — Encounter: Payer: Self-pay | Admitting: Family Medicine

## 2022-11-19 ENCOUNTER — Other Ambulatory Visit: Payer: Self-pay | Admitting: Family Medicine

## 2022-11-20 ENCOUNTER — Other Ambulatory Visit: Payer: Self-pay

## 2022-11-20 NOTE — Telephone Encounter (Signed)
Entered in error

## 2022-11-20 NOTE — Telephone Encounter (Signed)
Unable to refill per protocol, Rx request was refused 11/20/22. Will refuse duplicate request.  Requested Prescriptions  Pending Prescriptions Disp Refills   cyclobenzaprine (FLEXERIL) 10 MG tablet [Pharmacy Med Name: Cyclobenzaprine HCl 10 MG Oral Tablet] 30 tablet 0    Sig: TAKE 1 TABLET BY MOUTH AT BEDTIME     Not Delegated - Analgesics:  Muscle Relaxants Failed - 11/19/2022  5:46 PM      Failed - This refill cannot be delegated      Passed - Valid encounter within last 6 months    Recent Outpatient Visits           3 weeks ago Need for influenza vaccination   [place], Megan P, DO   4 months ago Routine general medical examination at a health care facility   [facility], [place], DO   9 months ago Primary hypertension   [place], Megan P, DO   10 months ago Chronic gout of multiple sites, unspecified cause   [place], [place], DO   1 year ago Mixed hyperlipidemia   [facility] [place], Barb Merino, DO       Future Appointments             In 1 month [place], [place], MD [place]   In 2 months [place], Barb Merino, DO [employer]

## 2022-11-22 ENCOUNTER — Encounter: Payer: Self-pay | Admitting: Dermatology

## 2022-11-23 ENCOUNTER — Other Ambulatory Visit: Payer: Self-pay | Admitting: Family Medicine

## 2022-11-23 ENCOUNTER — Encounter: Payer: Self-pay | Admitting: Family Medicine

## 2022-11-23 NOTE — Telephone Encounter (Signed)
Pt called to report that he is almost out of his current supply.

## 2022-11-26 ENCOUNTER — Other Ambulatory Visit: Payer: Self-pay

## 2022-11-26 MED ORDER — CYCLOBENZAPRINE HCL 10 MG PO TABS: 10.0000 mg | ORAL_TABLET | Freq: Every day | ORAL | 2 refills | Status: DC

## 2022-11-26 NOTE — Telephone Encounter (Signed)
Requested medication (s) are due for refill today: no  Requested medication (s) are on the active medication list: yes  Last refill:  11/26/22 #30 2 RF  Future visit scheduled: yes  Notes to clinic:  called pharmacy and prescription is at pharmacy for pt to pick [place] not delegated to to refuse or refill   Requested Prescriptions  Pending Prescriptions Disp Refills   cyclobenzaprine (FLEXERIL) 10 MG tablet [Pharmacy Med Name: Cyclobenzaprine HCl 10 MG Oral Tablet] 30 tablet 0    Sig: TAKE 1 TABLET BY MOUTH AT BEDTIME     Not Delegated - Analgesics:  Muscle Relaxants Failed - 11/23/2022  1:48 PM      Failed - This refill cannot be delegated      Passed - Valid encounter within last 6 months    Recent Outpatient Visits           1 month ago Need for influenza vaccination   [place], [place], DO   4 months ago Routine general medical examination at a health care facility   [facility], [place], DO   9 months ago Primary hypertension   [place], Megan P, DO   10 months ago Chronic gout of multiple sites, unspecified cause   [place], [place], DO   1 year ago Mixed hyperlipidemia   [facility] [place], Barb Merino, DO       Future Appointments             In 1 month [place], [place], MD [place]   In 1 month [place], Barb Merino, DO [employer], [place]

## 2022-11-26 NOTE — Telephone Encounter (Signed)
Entered in error

## 2022-12-02 ENCOUNTER — Encounter: Payer: Self-pay | Admitting: Family Medicine

## 2022-12-03 ENCOUNTER — Ambulatory Visit: Payer: Self-pay

## 2022-12-03 ENCOUNTER — Encounter: Payer: Self-pay | Admitting: Dermatology

## 2022-12-03 NOTE — Telephone Encounter (Signed)
  Chief Complaint: COVID Symptoms: cough congestion and hoarseness Frequency: Friday evening Pertinent Negatives: Patient denies SOB or fever Disposition: '[]'$ ED /'[]'$ Urgent Care (no appt availability in office) / '[x]'$ Appointment(In office/virtual)/ '[]'$  [place] [facility]/ '[]'$ Home Care/ '[]'$ Refused Recommended Disposition /'[]'$  Mobile Bus/ '[]'$  Follow-up with PCP Additional Notes: pt states his sx haven't improved and has been using OTC Delsym. Advised to be seen. Pt preferred OV d/t having difficulty doing VV on his phone. Scheduled OV for tomorrow at 1040 with Malachy Mood, NP. Care advice given and pt verbalized understanding.   Summary: Covid +   Pt states he tested Covid + 12-4  Pt wanting advice on how to care for symptoms  Please assist further     Reason for Disposition  [1] Continuous (nonstop) coughing interferes with work or school AND [2] no improvement using cough treatment per Care Advice  Answer Assessment - Initial Assessment Questions 1. COVID-19 DIAGNOSIS: "How do you know that you have COVID?" (e.g., positive lab test or self-test, diagnosed by doctor or NP/PA, symptoms after exposure).     Home test today 3. ONSET: "When did the COVID-19 symptoms start?"      Friday night  5. COUGH: "Do you have a cough?" If Yes, ask: "How bad is the cough?"       yes 7. RESPIRATORY STATUS: "Describe your breathing?" (e.g., normal; shortness of breath, wheezing, unable to speak)      Normal  9. OTHER SYMPTOMS: "Do you have any other symptoms?"  (e.g., chills, fatigue, headache, loss of smell or taste, muscle pain, sore throat)     Cough, congestion, hoarse  10. HIGH RISK DISEASE: "Do you have any chronic medical problems?" (e.g., asthma, heart or lung disease, weak immune system, obesity, etc.)       no 11. VACCINE: "Have you had the COVID-19 vaccine?" If Yes, ask: "Which one, how many shots, when did you get it?"       1st 2 shots  Protocols used: Coronavirus (COVID-19) Diagnosed  or Suspected-A-AH

## 2022-12-04 ENCOUNTER — Encounter: Payer: Self-pay | Admitting: Unknown Physician Specialty

## 2022-12-04 ENCOUNTER — Ambulatory Visit (INDEPENDENT_AMBULATORY_CARE_PROVIDER_SITE_OTHER): Payer: Medicaid Other | Admitting: Unknown Physician Specialty

## 2022-12-04 ENCOUNTER — Encounter: Payer: Self-pay | Admitting: Family Medicine

## 2022-12-04 VITALS — Temp 98.5°F

## 2022-12-04 DIAGNOSIS — U071 COVID-19: Secondary | ICD-10-CM

## 2022-12-04 MED ORDER — NIRMATRELVIR/RITONAVIR (PAXLOVID)TABLET: 3.0000 | ORAL_TABLET | Freq: Two times a day (BID) | ORAL | 0 refills | Status: AC

## 2022-12-04 NOTE — Progress Notes (Signed)
Temp 98.5 F (36.9 C) (Oral)    Subjective:    Patient ID: Jesus Guerrero, male    DOB: [birth year], 51 y.o.   MRN: [id number]  HPI: DOM Jesus Guerrero is a 51 y.o. male  Chief Complaint  Patient presents with   Covid Positive    Pt states he tested positive for Covid yesterday via a home test. States he started having symptoms of a cough, congestion, body aches, chills, and sinus drainage since Friday evening   This visit was completed via telephone due to the restrictions of the COVID-19 pandemic. All issues as above were discussed and addressed but no physical exam was performed. If it was felt that the patient should be evaluated in the office, they were directed there. The patient verbally consented to this visit. Patient was unable to complete an audio/visual visit due to Lack of internet. Location of the patient: home Location of the provider: work I verified patient identity using two factors (patient name and date of birth). Patient consents verbally to being seen via telemedicine visit today.   URI  This is a new problem. Episode onset: 4 days ago. The problem has been unchanged. Maximum temperature: Does not take temp. Associated symptoms include congestion, coughing, headaches, joint pain and rhinorrhea. Pertinent negatives include no abdominal pain, chest pain, diarrhea, dysuria, ear pain, joint swelling, nausea, neck pain, plugged ear sensation, rash, sinus pain, sneezing, sore throat, swollen glands, vomiting or wheezing. He has tried nothing for the symptoms.    Relevant past medical, surgical, family and social history reviewed and updated as indicated. Interim medical history since our last visit reviewed. Allergies and medications reviewed and updated.  Review of Systems  HENT:  Positive for congestion and rhinorrhea. Negative for ear pain, sinus pain, sneezing and sore throat.   Respiratory:  Positive for cough. Negative for wheezing.   Cardiovascular:  Negative  for chest pain.  Gastrointestinal:  Negative for abdominal pain, diarrhea, nausea and vomiting.  Genitourinary:  Negative for dysuria.  Musculoskeletal:  Positive for joint pain. Negative for neck pain.  Skin:  Negative for rash.  Neurological:  Positive for headaches.    Per HPI unless specifically indicated above     Objective:    Temp 98.5 F (36.9 C) (Oral)   Wt Readings from Last 3 Encounters:  10/24/22 196 lb 12.8 oz (89.3 kg)  07/18/22 192 lb (87.1 kg)  02/06/22 196 lb (88.9 kg)    Physical Exam Neurological:     General: No focal deficit present.     Mental Status: He is alert.  Psychiatric:        Mood and Affect: Mood normal.        Behavior: Behavior normal.        Thought Content: Thought content normal.     Results for orders placed or performed in visit on 07/18/22  Microscopic Examination   Urine  Result Value Ref Range   WBC, UA 6-10 (A) 0 - 5 /hpf   RBC, Urine 3-10 (A) 0 - 2 /hpf   Epithelial Cells (non renal) None seen 0 - 10 /hpf   Mucus, UA Present (A) Not Estab.   Bacteria, UA None seen None seen/Few  Comprehensive metabolic panel  Result Value Ref Range   Glucose 105 (H) 70 - 99 mg/dL   BUN 22 6 - 24 mg/dL   Creatinine, Ser 1.15 0.76 - 1.27 mg/dL   eGFR 77 >59 mL/min/1.73   BUN/Creatinine Ratio 19 9 -  20   Sodium 137 134 - 144 mmol/L   Potassium 4.6 3.5 - 5.2 mmol/L   Chloride 102 96 - 106 mmol/L   CO2 20 20 - 29 mmol/L   Calcium 9.1 8.7 - 10.2 mg/dL   Total Protein 7.2 6.0 - 8.5 g/dL   Albumin 4.7 3.8 - 4.9 g/dL   Globulin, Total 2.5 1.5 - 4.5 g/dL   Albumin/Globulin Ratio 1.9 1.2 - 2.2   Bilirubin Total 0.9 0.0 - 1.2 mg/dL   Alkaline Phosphatase 118 44 - 121 IU/L   AST 20 0 - 40 IU/L   ALT 18 0 - 44 IU/L  CBC with Differential/Platelet  Result Value Ref Range   WBC 9.1 3.4 - 10.8 x10E3/uL   RBC 4.90 4.14 - 5.80 x10E6/uL   Hemoglobin 14.8 13.0 - 17.7 g/dL   Hematocrit 42.9 37.5 - 51.0 %   MCV 88 79 - 97 fL   MCH 30.2 26.6 -  33.0 pg   MCHC 34.5 31.5 - 35.7 g/dL   RDW 12.4 11.6 - 15.4 %   Platelets 259 150 - 450 x10E3/uL   Neutrophils 74 Not Estab. %   Lymphs 20 Not Estab. %   Monocytes 5 Not Estab. %   Eos 1 Not Estab. %   Basos 0 Not Estab. %   Neutrophils Absolute 6.6 1.4 - 7.0 x10E3/uL   Lymphocytes Absolute 1.8 0.7 - 3.1 x10E3/uL   Monocytes Absolute 0.5 0.1 - 0.9 x10E3/uL   EOS (ABSOLUTE) 0.1 0.0 - 0.4 x10E3/uL   Basophils Absolute 0.0 0.0 - 0.2 x10E3/uL   Immature Granulocytes 0 Not Estab. %   Immature Grans (Abs) 0.0 0.0 - 0.1 x10E3/uL  Lipid Panel w/o Chol/HDL Ratio  Result Value Ref Range   Cholesterol, Total 155 100 - 199 mg/dL   Triglycerides 244 (H) 0 - 149 mg/dL   HDL 39 (L) >39 mg/dL   VLDL Cholesterol Cal 40 5 - 40 mg/dL   LDL Chol Calc (NIH) 76 0 - 99 mg/dL  PSA  Result Value Ref Range   Prostate Specific Ag, Serum 0.4 0.0 - 4.0 ng/mL  TSH  Result Value Ref Range   TSH 2.740 0.450 - 4.500 uIU/mL  Urinalysis, Routine w reflex microscopic  Result Value Ref Range   Specific Gravity, UA 1.010 1.005 - 1.030   pH, UA 6.0 5.0 - 7.5   Color, UA Yellow Yellow   Appearance Ur Clear Clear   Leukocytes,UA 1+ (A) Negative   Protein,UA Negative Negative/Trace   Glucose, UA Negative Negative   Ketones, UA Negative Negative   RBC, UA 1+ (A) Negative   Bilirubin, UA Negative Negative   Urobilinogen, Ur 0.2 0.2 - 1.0 mg/dL   Nitrite, UA Negative Negative   Microscopic Examination See below:   Microalbumin, Urine Waived  Result Value Ref Range   Microalb, Ur Waived 10 0 - 19 mg/L   Creatinine, Urine Waived 50 10 - 300 mg/dL   Microalb/Creat Ratio <30 <30 mg/g  Uric acid  Result Value Ref Range   Uric Acid 5.6 3.8 - 8.4 mg/dL   *Note: Due to a large number of results and/or encounters for the requested time period, some results have not been displayed. A complete set of results can be found in Results Review.      Assessment & Plan:   Problem List Items Addressed This Visit    None Visit Diagnoses     COVID-19    -  Primary   Rx  for Paxlovid.  Hold Atorvastatin while taking.  Pt ed on OTC cough meds as allergic to codeine.   Relevant Medications   nirmatrelvir/ritonavir EUA (PAXLOVID) 20 x 150 MG & 10 x 100MG TABS       Discussed rest and fluids.  If SOB go to the ER  Follow up plan: Return if symptoms worsen or fail to improve.

## 2022-12-06 ENCOUNTER — Encounter: Payer: Self-pay | Admitting: Family Medicine

## 2022-12-06 ENCOUNTER — Ambulatory Visit: Payer: Medicaid Other

## 2022-12-09 ENCOUNTER — Encounter: Payer: Self-pay | Admitting: Dermatology

## 2022-12-13 ENCOUNTER — Ambulatory Visit (INDEPENDENT_AMBULATORY_CARE_PROVIDER_SITE_OTHER): Payer: Medicaid Other

## 2022-12-13 DIAGNOSIS — L209 Atopic dermatitis, unspecified: Secondary | ICD-10-CM

## 2022-12-13 MED ORDER — TRALOKINUMAB-LDRM 150 MG/ML ~~LOC~~ SOSY
300.0000 mg | PREFILLED_SYRINGE | Freq: Once | SUBCUTANEOUS | Status: AC
  Administered 2022-12-13: 300 mg via SUBCUTANEOUS

## 2022-12-13 NOTE — Progress Notes (Signed)
Patient here today for 4 week Adbry Injection for Severe Atopic Dermatitis.   Adbry '300mg'$ /mL SQ Q4 wks  Adbry 300 mg/mL injected today to right upper arm. Patient tolerated well.   [place]: 5374-827-07 Lot: [id number] Exp: 09/2023   Dicie Beam RMA

## 2022-12-14 ENCOUNTER — Encounter: Payer: Self-pay | Admitting: Dermatology

## 2022-12-21 ENCOUNTER — Encounter: Payer: Self-pay | Admitting: Family Medicine

## 2022-12-24 ENCOUNTER — Encounter: Payer: Self-pay | Admitting: Dermatology

## 2022-12-31 ENCOUNTER — Encounter: Payer: Self-pay | Admitting: Dermatology

## 2023-01-06 ENCOUNTER — Other Ambulatory Visit: Payer: Self-pay | Admitting: Dermatology

## 2023-01-06 ENCOUNTER — Encounter: Payer: Self-pay | Admitting: Dermatology

## 2023-01-06 DIAGNOSIS — L209 Atopic dermatitis, unspecified: Secondary | ICD-10-CM

## 2023-01-07 MED ORDER — MUPIROCIN 2 % EX OINT: TOPICAL_OINTMENT | CUTANEOUS | 0 refills | Status: DC

## 2023-01-08 ENCOUNTER — Other Ambulatory Visit: Payer: Self-pay

## 2023-01-08 DIAGNOSIS — L209 Atopic dermatitis, unspecified: Secondary | ICD-10-CM

## 2023-01-08 MED ORDER — ADBRY 150 MG/ML ~~LOC~~ SOSY: 300.0000 mg | PREFILLED_SYRINGE | SUBCUTANEOUS | 2 refills | Status: DC

## 2023-01-08 NOTE — Progress Notes (Signed)
Refill request per pharmacy. aw

## 2023-01-10 ENCOUNTER — Other Ambulatory Visit: Payer: Self-pay

## 2023-01-10 ENCOUNTER — Other Ambulatory Visit: Payer: Self-pay | Admitting: Dermatology

## 2023-01-10 ENCOUNTER — Ambulatory Visit (INDEPENDENT_AMBULATORY_CARE_PROVIDER_SITE_OTHER): Payer: Medicaid Other | Admitting: Dermatology

## 2023-01-10 ENCOUNTER — Encounter: Payer: Self-pay | Admitting: Family Medicine

## 2023-01-10 ENCOUNTER — Encounter: Payer: Self-pay | Admitting: Dermatology

## 2023-01-10 VITALS — BP 166/89 | HR 91

## 2023-01-10 DIAGNOSIS — L304 Erythema intertrigo: Secondary | ICD-10-CM | POA: Diagnosis not present

## 2023-01-10 DIAGNOSIS — L299 Pruritus, unspecified: Secondary | ICD-10-CM

## 2023-01-10 DIAGNOSIS — Z79899 Other long term (current) drug therapy: Secondary | ICD-10-CM

## 2023-01-10 DIAGNOSIS — L700 Acne vulgaris: Secondary | ICD-10-CM

## 2023-01-10 DIAGNOSIS — L209 Atopic dermatitis, unspecified: Secondary | ICD-10-CM | POA: Diagnosis not present

## 2023-01-10 DIAGNOSIS — L2089 Other atopic dermatitis: Secondary | ICD-10-CM

## 2023-01-10 MED ORDER — GABAPENTIN 10 % EX CREA: 1.0000 | TOPICAL_CREAM | Freq: Three times a day (TID) | CUTANEOUS | 2 refills | Status: DC

## 2023-01-10 MED ORDER — ZORYVE 0.3 % EX CREA: TOPICAL_CREAM | CUTANEOUS | 2 refills | Status: DC

## 2023-01-10 MED ORDER — KETOCONAZOLE 2 % EX CREA: 1.0000 | TOPICAL_CREAM | Freq: Two times a day (BID) | CUTANEOUS | 2 refills | Status: AC

## 2023-01-10 MED ORDER — CLINDAMYCIN PHOS-BENZOYL PEROX 1.2-5 % EX GEL: CUTANEOUS | 2 refills | Status: DC

## 2023-01-10 MED ORDER — TRALOKINUMAB-LDRM 150 MG/ML ~~LOC~~ SOSY
300.0000 mg | PREFILLED_SYRINGE | Freq: Once | SUBCUTANEOUS | Status: AC
  Administered 2023-01-10: 300 mg via SUBCUTANEOUS

## 2023-01-10 NOTE — Progress Notes (Signed)
   Follow-Up Visit   Subjective  Jesus Guerrero is a 52 y.o. male who presents for the following: Eczema (2 month follow up. On Adbry 300 mg once a month).  His scalp and a rash at the groin are bothering him.   The following portions of the chart were reviewed this encounter and updated as appropriate:  Tobacco  Allergies  Meds  Problems  Med Hx  Surg Hx  Fam Hx      Review of Systems: No other skin or systemic complaints except as noted in HPI or Assessment and Plan.   Objective  Well appearing patient in no apparent distress; mood and affect are within normal limits.  A focused examination was performed including head, torso, arms, hands. Relevant physical exam findings are noted in the Assessment and Plan.  hands, scalp, back, face Scaly pink papules coalescing to plaques at hands, scar at scalp   Pubic Erythematous patches   Assessment & Plan  Atopic dermatitis, unspecified type hands, scalp, back, face  Chronic and persistent condition with duration or expected duration over one year. Condition is symptomatic/ bothersome to patient. Not currently at goal.  Atopic dermatitis (eczema) is a chronic, relapsing, pruritic condition that can significantly affect quality of life. It is often associated with allergic rhinitis and/or asthma and can require treatment with topical medications, phototherapy, or in severe cases biologic injectable medication (Dupixent; Adbry) or Oral JAK inhibitors.  Adbry '300mg'$ /mL SQ Q4 wks   Adbry 300 mg/mL injected today to left upper arm. Patient tolerated well.    NDC: 1638-453-64 Lot: [id number] Exp: 09/2023   Start gabapentin 10% cream three times daily to scalp as needed for itch or pain. Start samples of Zoryve once daily to scalp and hands as needed Restart Opzelura twice daily to areas of eczema. Recommend Neutrogena [place] Hand Cream   Related Medications ELIDEL 1 % cream Apply topically 2 (two) times daily.  mupirocin  ointment (BACTROBAN) 2 % APPLY TOPICALLY TO AFFECTED AREA ONCE DAILY  Tralokinumab-ldrm (ADBRY) 150 MG/ML SOSY Inject 2 mLs (300 mg total) into the skin every 14 (fourteen) days.  Tralokinumab-ldrm SOSY 300 mg   Erythema intertrigo Pubic  Start ketoconazole twice daily to affected areas of rash at groin. Start Zoryve once daily to affected areas of rash at groin.  ketoconazole (NIZORAL) 2 % cream - Pubic Apply 1 Application topically 2 (two) times daily.  Roflumilast (ZORYVE) 0.3 % CREA - Pubic Once daily to affected areas  Encounter for long-term current use of medication Left Hand - Anterior  Pruritus Scalp  C/w neuropathic itch Previous biopsy c/w [facility]  Start topical gabapentin cream  If not covered, consider topical lidocaine or prescription [facility] anti-itch compounded gabapentin     Return for Eczema 2-3 months.  [name], RMA, am acting as scribe for [name], MD .  Documentation: I have reviewed the above documentation for accuracy and completeness, and I agree with the above.  [name], MD

## 2023-01-10 NOTE — Patient Instructions (Addendum)
For rash - Start ketoconazole twice daily to affected areas of rash at groin. Start Zoryve once daily to affected areas of rash at groin.  For eczema - Start gabapentin 10% cream three times daily to scalp as needed for itch or pain. Start samples of Zoryve once daily to scalp and hands as needed Restart Opzelura twice daily to areas of eczema. Recommend Neutrogena [place] Hand Cream   Due to recent changes in healthcare laws, you may see results of your pathology and/or laboratory studies on MyChart before the doctors have had a chance to review them. We understand that in some cases there may be results that are confusing or concerning to you. Please understand that not all results are received at the same time and often the doctors may need to interpret multiple results in order to provide you with the best plan of care or course of treatment. Therefore, we ask that you please give [place] 2 business days to thoroughly review all your results before contacting the office for clarification. Should we see a critical lab result, you will be contacted sooner.   If You Need Anything After Your Visit  If you have any questions or concerns for your doctor, please call our main line at [phone] and press option 4 to reach your doctor's medical assistant. If no one answers, please leave a voicemail as directed and we will return your call as soon as possible. Messages left after 4 pm will be answered the following business day.   You may also send [place] a message via [place]. We typically respond to MyChart messages within 1-2 business days.  For prescription refills, please ask your pharmacy to contact our office. Our fax number is [phone].  If you have an urgent issue when the clinic is closed that cannot wait until the next business day, you can page your doctor at the number below.    Please note that while we do our best to be available for urgent issues outside of office hours, we are not  available 24/7.   If you have an urgent issue and are unable to reach [place], you may choose to seek medical care at your doctor's office, retail clinic, urgent care center, or emergency room.  If you have a medical emergency, please immediately call 911 or go to the emergency department.  Pager Numbers  - Dr. Nehemiah Massed: [phone]  - Dr. Laurence Ferrari: [phone]  - Dr. Nicole Kindred: [phone]  In the event of inclement weather, please call our main line at [phone] for an update on the status of any delays or closures.  Dermatology Medication Tips: Please keep the boxes that topical medications come in in order to help keep track of the instructions about where and how to use these. Pharmacies typically print the medication instructions only on the boxes and not directly on the medication tubes.   If your medication is too expensive, please contact our office at [phone] option 4 or send [place] a message through [place].   We are unable to tell what your co-pay for medications will be in advance as this is different depending on your insurance coverage. However, we may be able to find a substitute medication at lower cost or fill out paperwork to get insurance to cover a needed medication.   If a prior authorization is required to get your medication covered by your insurance company, please allow [place] 1-2 business days to complete this process.  Drug prices often vary depending on where the  prescription is filled and some pharmacies may offer cheaper prices.  The website www.goodrx.com contains coupons for medications through different pharmacies. The prices here do not account for what the cost may be with help from insurance (it may be cheaper with your insurance), but the website can give you the price if you did not use any insurance.  - You can print the associated coupon and take it with your prescription to the pharmacy.  - You may also stop by our office during regular business hours  and pick up a GoodRx coupon card.  - If you need your prescription sent electronically to a different pharmacy, notify our office through [facility] or by phone at [phone] option 4.     Si Usted Necesita Algo Despus de Su Visita  Tambin puede enviarnos un mensaje a travs de Pharmacist, community. Por lo general respondemos a los mensajes de MyChart en el transcurso de 1 a 2 das hbiles.  Para renovar recetas, por favor pida a su farmacia que se ponga en contacto con nuestra oficina. Harland Dingwall de fax es [place] [phone].  Si tiene un asunto urgente cuando la clnica est cerrada y que no puede esperar hasta el siguiente da hbil, puede llamar/localizar a su doctor(a) al nmero que aparece a continuacin.   Por favor, tenga en cuenta que aunque hacemos todo lo posible para estar disponibles para asuntos urgentes fuera del horario de [place], no estamos disponibles las 24 horas del da, los 7 das de la [place].   Si tiene un problema urgente y no puede comunicarse con nosotros, puede optar por buscar atencin mdica  en el consultorio de su doctor(a), en una clnica privada, en un centro de atencin urgente o en una sala de emergencias.  Si tiene Engineering geologist, por favor llame inmediatamente al 911 o vaya a la sala de emergencias.  Nmeros de bper  - Dr. Nehemiah Massed: [phone]  - Dra. Moye: [phone]  - Dra. Nicole Kindred: [phone]  En caso de inclemencias del [place], por favor llame a Johnsie Kindred principal al [phone] para una actualizacin sobre el [place] de cualquier retraso o cierre.  Consejos para la medicacin en dermatologa: Por favor, guarde las cajas en las que vienen los medicamentos de uso tpico para ayudarle a seguir las instrucciones sobre dnde y cmo usarlos. Las farmacias generalmente imprimen las instrucciones del medicamento slo en las cajas y no directamente en los tubos del [place].   Si su medicamento es muy caro, por favor, pngase  en contacto con Zigmund Daniel llamando al [phone] y presione la opcin 4 o envenos un mensaje a travs de Pharmacist, community.   No podemos decirle cul ser su copago por los medicamentos por adelantado ya que esto es diferente dependiendo de la cobertura de su seguro. Sin embargo, es posible que podamos encontrar un medicamento sustituto a Electrical engineer un formulario para que el seguro cubra el medicamento que se considera necesario.   Si se requiere una autorizacin previa para que su compaa de seguros [place] su medicamento, por favor permtanos de 1 a 2 das hbiles para completar este proceso.  Los precios de los medicamentos varan con frecuencia dependiendo del Environmental consultant de dnde se surte la receta y alguna farmacias pueden ofrecer precios ms baratos.  El sitio web www.goodrx.com tiene cupones para medicamentos de Airline pilot. Los precios aqu no tienen en cuenta lo que podra costar con la ayuda del seguro (puede ser ms barato con su seguro), pero el sitio web  puede darle el precio si no utiliz [employer].  - Puede imprimir el cupn correspondiente y llevarlo con su receta a la farmacia.  - Tambin puede pasar por nuestra oficina durante el horario de atencin regular y Charity fundraiser una tarjeta de cupones de GoodRx.  - Si necesita que su receta se enve electrnicamente a una farmacia diferente, informe a nuestra oficina a travs de MyChart de [place] o por telfono llamando al [phone] y presione la opcin 4.

## 2023-01-12 ENCOUNTER — Encounter: Payer: Self-pay | Admitting: Dermatology

## 2023-01-14 ENCOUNTER — Encounter: Payer: Self-pay | Admitting: Family Medicine

## 2023-01-15 ENCOUNTER — Telehealth: Payer: Self-pay

## 2023-01-15 NOTE — Telephone Encounter (Signed)
Gabapentin 10% Cream not covered and Zoryve is a plan exclusion for Medicaid at this time.   [facility] did state in a fax that [place] could compound medication but patient may not have access to get to that pharmacy.   Opzelura PA submitted to Medicaid.

## 2023-01-15 NOTE — Telephone Encounter (Signed)
Will continue with opzelura for now if we can get it covered and consider po gabapentin or compounded if feasible for pt at followup

## 2023-01-17 ENCOUNTER — Encounter: Payer: Self-pay | Admitting: Family Medicine

## 2023-01-18 ENCOUNTER — Other Ambulatory Visit: Payer: Self-pay | Admitting: Dermatology

## 2023-01-18 ENCOUNTER — Encounter: Payer: Self-pay | Admitting: Dermatology

## 2023-01-18 DIAGNOSIS — L2089 Other atopic dermatitis: Secondary | ICD-10-CM

## 2023-01-21 ENCOUNTER — Encounter: Payer: Self-pay | Admitting: Dermatology

## 2023-01-22 ENCOUNTER — Other Ambulatory Visit: Payer: Self-pay | Admitting: Dermatology

## 2023-01-22 ENCOUNTER — Encounter: Payer: Self-pay | Admitting: Family Medicine

## 2023-01-22 MED ORDER — LIDOCAINE 0.5 % EX GEL: CUTANEOUS | 0 refills | Status: DC

## 2023-01-22 NOTE — Telephone Encounter (Signed)
There is a topical compounded cream from [facility] that I believe would help for the scalp (it has gabapentin in it) but it is around $50 cash price plus shipping. If that is in the budget, we could send that in for him to try. Otherwise I would suggest trying lidocaine cream small amount 3 times per day to the itchy area on his scalp.  We could also increase his Adbry to every 2 weeks again if he think he did better on the more frequent dosing.

## 2023-01-23 ENCOUNTER — Other Ambulatory Visit: Payer: Self-pay | Admitting: Dermatology

## 2023-01-23 MED ORDER — DICLOFENAC SODIUM 1 % EX GEL: CUTANEOUS | 1 refills | Status: AC

## 2023-01-23 MED ORDER — DICLOFENAC SODIUM 1.5 % EX SOLN: CUTANEOUS | 1 refills | Status: DC

## 2023-01-23 NOTE — Telephone Encounter (Signed)
Estill Bamberg, can you see if they will pay for any lidocaine topical cream or gel or ointment?   Please also suggest to him we try increasing the Adbry to every 2 weeks again and see if it helps. Thank you!

## 2023-01-23 NOTE — Telephone Encounter (Signed)
Excellent, we can do that then. Thank you!

## 2023-01-23 NOTE — Telephone Encounter (Signed)
Thank you. I just sent in diclofenac solution to use 4 times a day. If not covered, we can try for cream. Please let me know what he decides on the Adbry. Thank you!

## 2023-01-24 ENCOUNTER — Ambulatory Visit: Payer: Medicaid Other | Admitting: Family Medicine

## 2023-01-25 ENCOUNTER — Ambulatory Visit (INDEPENDENT_AMBULATORY_CARE_PROVIDER_SITE_OTHER): Payer: Medicaid Other | Admitting: Family Medicine

## 2023-01-25 ENCOUNTER — Encounter: Payer: Self-pay | Admitting: Family Medicine

## 2023-01-25 VITALS — BP 124/72 | HR 97 | Temp 98.1°F | Ht 64.0 in | Wt 198.3 lb

## 2023-01-25 DIAGNOSIS — M1A09X0 Idiopathic chronic gout, multiple sites, without tophus (tophi): Secondary | ICD-10-CM | POA: Diagnosis not present

## 2023-01-25 DIAGNOSIS — E782 Mixed hyperlipidemia: Secondary | ICD-10-CM | POA: Diagnosis not present

## 2023-01-25 DIAGNOSIS — F419 Anxiety disorder, unspecified: Secondary | ICD-10-CM

## 2023-01-25 DIAGNOSIS — I10 Essential (primary) hypertension: Secondary | ICD-10-CM | POA: Diagnosis not present

## 2023-01-25 MED ORDER — MONTELUKAST SODIUM 10 MG PO TABS: 10.0000 mg | ORAL_TABLET | Freq: Every day | ORAL | 1 refills | Status: DC

## 2023-01-25 MED ORDER — ATORVASTATIN CALCIUM 20 MG PO TABS: 20.0000 mg | ORAL_TABLET | Freq: Every day | ORAL | 1 refills | Status: DC

## 2023-01-25 MED ORDER — NAPROXEN 500 MG PO TABS: ORAL_TABLET | ORAL | 1 refills | Status: DC

## 2023-01-25 MED ORDER — HYDROXYZINE HCL 10 MG PO TABS: ORAL_TABLET | ORAL | 1 refills | Status: DC

## 2023-01-25 MED ORDER — LISINOPRIL 10 MG PO TABS: 10.0000 mg | ORAL_TABLET | Freq: Every day | ORAL | 1 refills | Status: DC

## 2023-01-25 MED ORDER — ALLOPURINOL 300 MG PO TABS: 300.0000 mg | ORAL_TABLET | Freq: Every day | ORAL | 1 refills | Status: DC

## 2023-01-25 NOTE — Progress Notes (Signed)
BP 124/72   Pulse 97   Temp 98.1 F (36.7 C) (Oral)   Ht '5\' 4"'$  (1.626 m)   Wt 198 lb 4.8 oz (89.9 kg)   SpO2 96%   BMI 34.04 kg/m    Subjective:    Patient ID: Jesus Guerrero, male    DOB: February 09, 1971, 52 y.o.   MRN: [id number]  HPI: Jesus Guerrero is a 52 y.o. male  Chief Complaint  Patient presents with   Hypertension   Hyperlipidemia   HYPERTENSION / Mercedes Satisfied with current treatment? yes Duration of hypertension: chronic BP monitoring frequency: not checking BP medication side effects: no Past BP meds: lisinopril Duration of hyperlipidemia: chronic Cholesterol medication side effects: no Cholesterol supplements: none Past cholesterol medications: atorvastatin Medication compliance: excellent compliance Aspirin: no Recent stressors: no Recurrent headaches: no Visual changes: no Palpitations: no Dyspnea: no Chest pain: no Lower extremity edema: no Dizzy/lightheaded: no  No gout flares. Feeling well. Tolerating his allopurinol well.   ANXIETY/STRESS Duration: chronic Status:controlled Anxious mood: yes  Excessive worrying: yes Irritability: no  Sweating: no Nausea: no Palpitations:no Hyperventilation: no Panic attacks: no Agoraphobia: no  Obscessions/compulsions: no Depressed mood: no    01/25/2023   10:35 AM 10/24/2022    9:21 AM 07/18/2022    9:07 AM 02/06/2022    9:54 AM 01/02/2022    9:26 AM  Depression screen PHQ 2/9  Decreased Interest 0 0 0 0 0  Down, Depressed, Hopeless 0 0 0 0 0  PHQ - 2 Score 0 0 0 0 0  Altered sleeping 0 0 0 0 0  Tired, decreased energy 0 0 0 0 0  Change in appetite 0 0 0 0 0  Feeling bad or failure about yourself  0 0 0 0 0  Trouble concentrating 0 0 0 0 0  Moving slowly or fidgety/restless 0 0 0 0 0  Suicidal thoughts 0 0 0 0 0  PHQ-9 Score 0 0 0 0 0  Difficult doing work/chores Not difficult at all   Not difficult at all    Anhedonia: no Weight changes: no Insomnia: no   Hypersomnia:  no Fatigue/loss of energy: no Feelings of worthlessness: no Feelings of guilt: no Impaired concentration/indecisiveness: no Suicidal ideations: no  Crying spells: no Recent Stressors/Life Changes: no   Relationship problems: no   Family stress: no     Financial stress: no    Job stress: no    Recent death/loss: no  Relevant past medical, surgical, family and social history reviewed and updated as indicated. Interim medical history since our last visit reviewed. Allergies and medications reviewed and updated.  Review of Systems  Constitutional: Negative.   Respiratory: Negative.    Cardiovascular: Negative.   Gastrointestinal: Negative.   Musculoskeletal: Negative.   Neurological: Negative.   Psychiatric/Behavioral: Negative.      Per HPI unless specifically indicated above     Objective:    BP 124/72   Pulse 97   Temp 98.1 F (36.7 C) (Oral)   Ht '5\' 4"'$  (1.626 m)   Wt 198 lb 4.8 oz (89.9 kg)   SpO2 96%   BMI 34.04 kg/m   Wt Readings from Last 3 Encounters:  01/25/23 198 lb 4.8 oz (89.9 kg)  10/24/22 196 lb 12.8 oz (89.3 kg)  07/18/22 192 lb (87.1 kg)    Physical Exam Vitals and nursing note reviewed.  Constitutional:      General: He is not in acute distress.    Appearance:  Normal appearance. He is obese. He is not ill-appearing, toxic-appearing or diaphoretic.  HENT:     Head: Normocephalic and atraumatic.     Right Ear: External ear normal.     Left Ear: External ear normal.     Nose: Nose normal.     Mouth/Throat:     Mouth: Mucous membranes are moist.     Pharynx: Oropharynx is clear.  Eyes:     General: No scleral icterus.       Right eye: No discharge.        Left eye: No discharge.     Extraocular Movements: Extraocular movements intact.     Conjunctiva/sclera: Conjunctivae normal.     Pupils: Pupils are equal, round, and reactive to light.  Cardiovascular:     Rate and Rhythm: Normal rate and regular rhythm.     Pulses: Normal pulses.      Heart sounds: Normal heart sounds. No murmur heard.    No friction rub. No gallop.  Pulmonary:     Effort: Pulmonary effort is normal. No respiratory distress.     Breath sounds: Normal breath sounds. No stridor. No wheezing, rhonchi or rales.  Chest:     Chest wall: No tenderness.  Musculoskeletal:        General: Normal range of motion.     Cervical back: Normal range of motion and neck supple.  Skin:    General: Skin is warm and dry.     Capillary Refill: Capillary refill takes less than 2 seconds.     Coloration: Skin is not jaundiced or pale.     Findings: No bruising, erythema, lesion or rash.  Neurological:     General: No focal deficit present.     Mental Status: He is alert and oriented to person, place, and time. Mental status is at baseline.  Psychiatric:        Mood and Affect: Mood normal.        Behavior: Behavior normal.        Thought Content: Thought content normal.        Judgment: Judgment normal.     Results for orders placed or performed in visit on 07/18/22  Microscopic Examination   Urine  Result Value Ref Range   WBC, UA 6-10 (A) 0 - 5 /hpf   RBC, Urine 3-10 (A) 0 - 2 /hpf   Epithelial Cells (non renal) None seen 0 - 10 /hpf   Mucus, UA Present (A) Not Estab.   Bacteria, UA None seen None seen/Few  Comprehensive metabolic panel  Result Value Ref Range   Glucose 105 (H) 70 - 99 mg/dL   BUN 22 6 - 24 mg/dL   Creatinine, Ser 1.15 0.76 - 1.27 mg/dL   eGFR 77 >59 mL/min/1.73   BUN/Creatinine Ratio 19 9 - 20   Sodium 137 134 - 144 mmol/L   Potassium 4.6 3.5 - 5.2 mmol/L   Chloride 102 96 - 106 mmol/L   CO2 20 20 - 29 mmol/L   Calcium 9.1 8.7 - 10.2 mg/dL   Total Protein 7.2 6.0 - 8.5 g/dL   Albumin 4.7 3.8 - 4.9 g/dL   Globulin, Total 2.5 1.5 - 4.5 g/dL   Albumin/Globulin Ratio 1.9 1.2 - 2.2   Bilirubin Total 0.9 0.0 - 1.2 mg/dL   Alkaline Phosphatase 118 44 - 121 IU/L   AST 20 0 - 40 IU/L   ALT 18 0 - 44 IU/L  CBC with Differential/Platelet   Result Value  Ref Range   WBC 9.1 3.4 - 10.8 x10E3/uL   RBC 4.90 4.14 - 5.80 x10E6/uL   Hemoglobin 14.8 13.0 - 17.7 g/dL   Hematocrit 42.9 37.5 - 51.0 %   MCV 88 79 - 97 fL   MCH 30.2 26.6 - 33.0 pg   MCHC 34.5 31.5 - 35.7 g/dL   RDW 12.4 11.6 - 15.4 %   Platelets 259 150 - 450 x10E3/uL   Neutrophils 74 Not Estab. %   Lymphs 20 Not Estab. %   Monocytes 5 Not Estab. %   Eos 1 Not Estab. %   Basos 0 Not Estab. %   Neutrophils Absolute 6.6 1.4 - 7.0 x10E3/uL   Lymphocytes Absolute 1.8 0.7 - 3.1 x10E3/uL   Monocytes Absolute 0.5 0.1 - 0.9 x10E3/uL   EOS (ABSOLUTE) 0.1 0.0 - 0.4 x10E3/uL   Basophils Absolute 0.0 0.0 - 0.2 x10E3/uL   Immature Granulocytes 0 Not Estab. %   Immature Grans (Abs) 0.0 0.0 - 0.1 x10E3/uL  Lipid Panel w/o Chol/HDL Ratio  Result Value Ref Range   Cholesterol, Total 155 100 - 199 mg/dL   Triglycerides 244 (H) 0 - 149 mg/dL   HDL 39 (L) >39 mg/dL   VLDL Cholesterol Cal 40 5 - 40 mg/dL   LDL Chol Calc (NIH) 76 0 - 99 mg/dL  PSA  Result Value Ref Range   Prostate Specific Ag, Serum 0.4 0.0 - 4.0 ng/mL  TSH  Result Value Ref Range   TSH 2.740 0.450 - 4.500 uIU/mL  Urinalysis, Routine w reflex microscopic  Result Value Ref Range   Specific Gravity, UA 1.010 1.005 - 1.030   pH, UA 6.0 5.0 - 7.5   Color, UA Yellow Yellow   Appearance Ur Clear Clear   Leukocytes,UA 1+ (A) Negative   Protein,UA Negative Negative/Trace   Glucose, UA Negative Negative   Ketones, UA Negative Negative   RBC, UA 1+ (A) Negative   Bilirubin, UA Negative Negative   Urobilinogen, Ur 0.2 0.2 - 1.0 mg/dL   Nitrite, UA Negative Negative   Microscopic Examination See below:   Microalbumin, Urine Waived  Result Value Ref Range   Microalb, Ur Waived 10 0 - 19 mg/L   Creatinine, Urine Waived 50 10 - 300 mg/dL   Microalb/Creat Ratio <30 <30 mg/g  Uric acid  Result Value Ref Range   Uric Acid 5.6 3.8 - 8.4 mg/dL   *Note: Due to a large number of results and/or encounters for the  requested time period, some results have not been displayed. A complete set of results can be found in Results Review.      Assessment & Plan:   Problem List Items Addressed This Visit       Cardiovascular and Mediastinum   Primary hypertension    Under good control on current regimen. Continue current regimen. Continue to monitor. Call with any concerns. Refills given. Labs drawn today.        Relevant Medications   atorvastatin (LIPITOR) 20 MG tablet   lisinopril (ZESTRIL) 10 MG tablet   Other Relevant Orders   CBC with Differential/Platelet   Comprehensive metabolic panel     Other   Acute anxiety    Under good control on current regimen. Continue current regimen. Continue to monitor. Call with any concerns. Refills given.       Relevant Medications   hydrOXYzine (ATARAX) 10 MG tablet   Hyperlipidemia    Under good control on current regimen. Continue current regimen. Continue  to monitor. Call with any concerns. Refills given. Labs drawn today.       Relevant Medications   atorvastatin (LIPITOR) 20 MG tablet   lisinopril (ZESTRIL) 10 MG tablet   Other Relevant Orders   CBC with Differential/Platelet   Comprehensive metabolic panel   Lipid Panel w/o Chol/HDL Ratio   Chronic gout - Primary    Under good control on current regimen. Continue current regimen. Continue to monitor. Call with any concerns. Refills given. Labs drawn today.       Relevant Orders   CBC with Differential/Platelet   Comprehensive metabolic panel   Uric acid     Follow up plan: Return in about 3 months (around 04/26/2023).

## 2023-01-25 NOTE — Assessment & Plan Note (Addendum)
Under good control on current regimen. Continue current regimen. Continue to monitor. Call with any concerns. Refills given.   

## 2023-01-25 NOTE — Assessment & Plan Note (Signed)
Under good control on current regimen. Continue current regimen. Continue to monitor. Call with any concerns. Refills given. Labs drawn today.   

## 2023-01-26 LAB — LIPID PANEL W/O CHOL/HDL RATIO
Cholesterol, Total: 173 mg/dL (ref 100–199)
HDL: 31 mg/dL — ABNORMAL LOW (ref >39)
LDL Chol Calc (NIH): 63 mg/dL (ref 0–99)
Triglycerides: 518 mg/dL — ABNORMAL HIGH (ref 0–149)
VLDL Cholesterol Cal: 79 mg/dL — ABNORMAL HIGH (ref 5–40)

## 2023-01-26 LAB — CBC WITH DIFFERENTIAL/PLATELET
Basophils Absolute: 0.1 10*3/uL (ref 0.0–0.2)
Basos: 1 %
EOS (ABSOLUTE): 0.1 10*3/uL (ref 0.0–0.4)
Eos: 1 %
Hematocrit: 42.4 % (ref 37.5–51.0)
Hemoglobin: 14.5 g/dL (ref 13.0–17.7)
Immature Grans (Abs): 0 10*3/uL (ref 0.0–0.1)
Immature Granulocytes: 0 %
Lymphocytes Absolute: 1.9 10*3/uL (ref 0.7–3.1)
Lymphs: 22 %
MCH: 30.8 pg (ref 26.6–33.0)
MCHC: 34.2 g/dL (ref 31.5–35.7)
MCV: 90 fL (ref 79–97)
Monocytes Absolute: 0.5 10*3/uL (ref 0.1–0.9)
Monocytes: 5 %
Neutrophils Absolute: 6.2 10*3/uL (ref 1.4–7.0)
Neutrophils: 71 %
Platelets: 279 10*3/uL (ref 150–450)
RBC: 4.71 x10E6/uL (ref 4.14–5.80)
RDW: 12.9 % (ref 11.6–15.4)
WBC: 8.8 10*3/uL (ref 3.4–10.8)

## 2023-01-26 LAB — COMPREHENSIVE METABOLIC PANEL
ALT: 29 IU/L (ref 0–44)
AST: 21 IU/L (ref 0–40)
Albumin/Globulin Ratio: 1.6 (ref 1.2–2.2)
Albumin: 4.4 g/dL (ref 3.8–4.9)
Alkaline Phosphatase: 117 IU/L (ref 44–121)
BUN/Creatinine Ratio: 22 — ABNORMAL HIGH (ref 9–20)
BUN: 23 mg/dL (ref 6–24)
Bilirubin Total: 0.4 mg/dL (ref 0.0–1.2)
CO2: 21 mmol/L (ref 20–29)
Calcium: 9.4 mg/dL (ref 8.7–10.2)
Chloride: 102 mmol/L (ref 96–106)
Creatinine, Ser: 1.06 mg/dL (ref 0.76–1.27)
Globulin, Total: 2.7 g/dL (ref 1.5–4.5)
Glucose: 124 mg/dL — ABNORMAL HIGH (ref 70–99)
Potassium: 4.9 mmol/L (ref 3.5–5.2)
Sodium: 139 mmol/L (ref 134–144)
Total Protein: 7.1 g/dL (ref 6.0–8.5)
eGFR: 84 mL/min/{1.73_m2} (ref >59)

## 2023-01-26 LAB — URIC ACID: Uric Acid: 4.4 mg/dL (ref 3.8–8.4)

## 2023-02-03 ENCOUNTER — Encounter: Payer: Self-pay | Admitting: Dermatology

## 2023-02-11 ENCOUNTER — Ambulatory Visit (INDEPENDENT_AMBULATORY_CARE_PROVIDER_SITE_OTHER): Payer: Medicaid Other

## 2023-02-11 DIAGNOSIS — L209 Atopic dermatitis, unspecified: Secondary | ICD-10-CM | POA: Diagnosis not present

## 2023-02-11 MED ORDER — ADBRY 150 MG/ML ~~LOC~~ SOSY: 300.0000 mg | PREFILLED_SYRINGE | SUBCUTANEOUS | 0 refills | Status: DC

## 2023-02-11 NOTE — Progress Notes (Signed)
Patient here today for 4 week Adbry Injection for Severe Atopic Dermatitis.    Adbry 365m/mL SQ Q4 wks   Adbry 300 mg/mL injected today to right upper arm. Patient tolerated well.    [place] [id number]: [id number]: 09/2023    AJohnsie Kindred RMA

## 2023-02-12 NOTE — Telephone Encounter (Signed)
Error

## 2023-02-17 ENCOUNTER — Encounter: Payer: Self-pay | Admitting: Family Medicine

## 2023-02-24 ENCOUNTER — Encounter: Payer: Self-pay | Admitting: Dermatology

## 2023-02-25 ENCOUNTER — Encounter: Payer: Self-pay | Admitting: Family Medicine

## 2023-02-25 ENCOUNTER — Other Ambulatory Visit: Payer: Self-pay | Admitting: Family Medicine

## 2023-02-25 ENCOUNTER — Encounter: Payer: Self-pay | Admitting: Dermatology

## 2023-02-25 MED ORDER — CYCLOBENZAPRINE HCL 10 MG PO TABS: 10.0000 mg | ORAL_TABLET | Freq: Every day | ORAL | 2 refills | Status: DC

## 2023-02-26 ENCOUNTER — Encounter: Payer: Self-pay | Admitting: Family Medicine

## 2023-02-26 ENCOUNTER — Other Ambulatory Visit: Payer: Self-pay | Admitting: Dermatology

## 2023-02-26 ENCOUNTER — Encounter: Payer: Self-pay | Admitting: Dermatology

## 2023-02-27 ENCOUNTER — Other Ambulatory Visit: Payer: Self-pay | Admitting: Dermatology

## 2023-02-27 DIAGNOSIS — L2089 Other atopic dermatitis: Secondary | ICD-10-CM

## 2023-02-28 ENCOUNTER — Encounter: Payer: Self-pay | Admitting: Family Medicine

## 2023-02-28 NOTE — Telephone Encounter (Signed)
appt

## 2023-03-02 ENCOUNTER — Encounter: Payer: Self-pay | Admitting: Dermatology

## 2023-03-04 ENCOUNTER — Encounter: Payer: Self-pay | Admitting: Dermatology

## 2023-03-05 ENCOUNTER — Encounter: Payer: Self-pay | Admitting: Family Medicine

## 2023-03-06 ENCOUNTER — Ambulatory Visit: Payer: Medicaid Other | Admitting: Family Medicine

## 2023-03-06 ENCOUNTER — Other Ambulatory Visit: Payer: Self-pay

## 2023-03-06 MED ORDER — HALOBETASOL PROPIONATE 0.05 % EX FOAM: CUTANEOUS | 1 refills | Status: DC

## 2023-03-09 ENCOUNTER — Encounter: Payer: Self-pay | Admitting: Family Medicine

## 2023-03-11 ENCOUNTER — Ambulatory Visit (INDEPENDENT_AMBULATORY_CARE_PROVIDER_SITE_OTHER): Payer: Medicaid Other

## 2023-03-11 ENCOUNTER — Encounter: Payer: Self-pay | Admitting: Dermatology

## 2023-03-11 DIAGNOSIS — L209 Atopic dermatitis, unspecified: Secondary | ICD-10-CM

## 2023-03-11 MED ORDER — TRALOKINUMAB-LDRM 150 MG/ML ~~LOC~~ SOSY
300.0000 mg | PREFILLED_SYRINGE | Freq: Once | SUBCUTANEOUS | Status: AC
  Administered 2023-03-11: 300 mg via SUBCUTANEOUS

## 2023-03-11 NOTE — Progress Notes (Signed)
Patient here today for 4 week Adbry Injection for Severe Atopic Dermatitis.    Adbry '300mg'$ /mL SQ Q4 wks   Adbry 300 mg/mL injected today to left upper arm. Patient tolerated well.    [place]: [id number] Lot: [id number] Exp: 01/2024   Johnsie Kindred, RMA

## 2023-03-12 ENCOUNTER — Encounter: Payer: Self-pay | Admitting: Dermatology

## 2023-03-15 ENCOUNTER — Encounter: Payer: Self-pay | Admitting: Dermatology

## 2023-03-20 ENCOUNTER — Encounter: Payer: Self-pay | Admitting: Dermatology

## 2023-03-23 ENCOUNTER — Encounter: Payer: Self-pay | Admitting: Dermatology

## 2023-03-25 ENCOUNTER — Encounter: Payer: Self-pay | Admitting: Dermatology

## 2023-03-25 ENCOUNTER — Telehealth: Payer: Self-pay

## 2023-03-25 NOTE — Telephone Encounter (Signed)
Medicaid is giving difficulties with covering Lexette Foam.   Medicaid will cover Halobetasol Cream or Ointment without a PA.   OK to switch?

## 2023-03-26 MED ORDER — HALOBETASOL PROPIONATE 0.05 % EX OINT: TOPICAL_OINTMENT | Freq: Two times a day (BID) | CUTANEOUS | 0 refills | Status: DC

## 2023-03-26 NOTE — Telephone Encounter (Signed)
Please let him know and we can try halobetasol ointment. Thank you!

## 2023-03-27 ENCOUNTER — Encounter: Payer: Self-pay | Admitting: Dermatology

## 2023-03-28 ENCOUNTER — Encounter: Payer: Self-pay | Admitting: Dermatology

## 2023-03-28 ENCOUNTER — Ambulatory Visit (INDEPENDENT_AMBULATORY_CARE_PROVIDER_SITE_OTHER): Payer: Medicaid Other | Admitting: Dermatology

## 2023-03-28 VITALS — BP 124/72

## 2023-03-28 DIAGNOSIS — L209 Atopic dermatitis, unspecified: Secondary | ICD-10-CM | POA: Diagnosis not present

## 2023-03-28 DIAGNOSIS — L299 Pruritus, unspecified: Secondary | ICD-10-CM

## 2023-03-28 DIAGNOSIS — L280 Lichen simplex chronicus: Secondary | ICD-10-CM | POA: Diagnosis not present

## 2023-03-28 DIAGNOSIS — L720 Epidermal cyst: Secondary | ICD-10-CM

## 2023-03-28 DIAGNOSIS — Z79899 Other long term (current) drug therapy: Secondary | ICD-10-CM

## 2023-03-28 DIAGNOSIS — L723 Sebaceous cyst: Secondary | ICD-10-CM

## 2023-03-28 MED ORDER — ADBRY 150 MG/ML ~~LOC~~ SOSY: 300.0000 mg | PREFILLED_SYRINGE | SUBCUTANEOUS | 5 refills | Status: DC

## 2023-03-28 MED ORDER — GABAPENTIN 300 MG PO CAPS: ORAL_CAPSULE | ORAL | 1 refills | Status: DC

## 2023-03-28 MED ORDER — TRALOKINUMAB-LDRM 150 MG/ML ~~LOC~~ SOSY
300.0000 mg | PREFILLED_SYRINGE | Freq: Once | SUBCUTANEOUS | Status: AC
  Administered 2023-03-28: 300 mg via SUBCUTANEOUS

## 2023-03-28 NOTE — Progress Notes (Signed)
Follow-Up Visit   Subjective  Jesus Guerrero is a 52 y.o. male who presents for the following: Atopic Dermatitis  Patient on Adbry 300 mg every 4 weeks, still with some itching but improved, mostly at scalp. Using clobetasol at scalp and elbows, Eucrisa and Opzelura. He uses mupirocin for any open areas and a rx for halobetasol ointment was sent in but patient has not picked it up yet.   Patient also with a spot at back of neck that is sore and a white spot he noticed on his penis. He said he has been using the bathroom a little bit more, pt advised to follow up with PCP regarding frequent urination.   The following portions of the chart were reviewed this encounter and updated as appropriate: medications, allergies, medical history  Review of Systems:  No other skin or systemic complaints except as noted in HPI or Assessment and Plan.  Objective  Well appearing patient in no apparent distress; mood and affect are within normal limits.  Areas Examined: Scalp, neck, elbows, penis, hands  Relevant physical exam findings are noted in the Assessment and Plan.  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  He has persistent itch despite Adbry, clobetasol, opzelura, eucrisa, tacrolimus, pimecrolimus, low dose naltrexone, ILK, topical pramoxine and topical menthol. NAC has been suggested but unsure if he has taken it. We have been unable to get topical gabapentin covered by insurance. Could consider topical doxepin if we can get it covered.  Will plan a trial of oral gabapentin. Start gabapentin 300 mg capsules - Take 1 capsule by mouth x 1 day, then increase to 1 capsule twice a day, then increase to 1 capsule 3 times per day. The capsules can be opened and sprinkled on food.  Reviewed risk of sedation/drowsiness. Reviewed that this  medication should not be stopped abruptly.  Start sample of Strata GRT to open areas at scalp as needed.   INFLAMED EPIDERMAL INCLUSION CYST Exam: erythematous firm papule at right occipital scalp  Symptomatic, irritating, patient would like treated.  Benign-appearing.  Call clinic for new or changing lesions.   Prior to procedure, discussed risks of blister formation, small wound, skin dyspigmentation, or rare scar following treatment. Recommend Vaseline ointment to treated areas while healing.  [place] [id number] Exp: 09/2024  Intralesional injection - right occipital scalp Location: right occipital scalp  Informed Consent: Discussed risks (infection, pain, bleeding, bruising, thinning of the skin, loss of skin pigment, lack of resolution, and recurrence of lesion) and benefits of the procedure, as well as the alternatives. Informed consent was obtained. Preparation: The area was prepared a standard fashion.  Procedure Details: An intralesional injection was performed with Kenalog 2 mg/cc. 0.1 cc in total were injected.  Total number of injections: 1  Plan: The patient was instructed on post-op care. Recommend OTC analgesia as needed for pain.   Epidermal inclusion cyst Penis Benign-appearing.  Observation.  Call clinic for new or changing lesions.    ATOPIC DERMATITIS Exam: scaly pink very thin plaques at hands  Chronic and persistent condition with duration or expected duration over one year. Condition is symptomatic/ bothersome to patient. Overall doing relatively well (and much better than without Adbry) but not completely clear. Not currently at goal.  Atopic dermatitis (eczema) is a chronic, relapsing, pruritic condition that can significantly affect quality  of life. It is often associated with allergic rhinitis and/or asthma and can require treatment with topical medications, phototherapy, or in severe cases biologic injectable medication (Dupixent; Adbry)  or Oral JAK inhibitors.  Treatment Plan: Continue Adbry 300 mg injection increasing to Q2 weeks Continue Opzelura to stubborn areas at hands twice a day and no more than 10% of skin Start halobetasol ointment twice daily to affected areas at scalp as needed for itch. Avoid applying to face, groin, and axilla. Use as directed. Long-term use can cause thinning of the skin.  Adbry 300 mg/82mL injected today to right upper arm. Patient tolerated well. Lot [id number] Exp: 09/2023  Topical steroids (such as triamcinolone, fluocinolone, fluocinonide, mometasone, clobetasol, halobetasol, betamethasone, hydrocortisone) can cause thinning and lightening of the skin if they are used for too long in the same area. Your physician has selected the right strength medicine for your problem and area affected on the body. Please use your medication only as directed by your physician to prevent side effects.   Tralokinumab [place]) is a treatment given by injection for adults with moderate-to-severe atopic dermatitis. Goal is control of skin condition, not cure. It is given as 4 injections at the first dose followed by 2 injections ever 2 weeks thereafter.  Potential side effects include allergic reaction, injection site reactions and conjunctivitis (inflammation of the eyes).  The use of Adbry requires long term medication management, including periodic office visits.  Recommend gentle skin care.  MELANOCYTIC NEVI Benign appearing on exam today. Recommend observation. Call clinic for new or changing moles. Recommend daily use of broad spectrum spf 30+ sunscreen to sun-exposed areas.  Exam Tan-brown and/or pink-flesh-colored symmetric macules and papules   Return in about 2 weeks (around 04/11/2023) for with Nurse for Adbry injection, 6 weeks for FBSE.  [name], RMA, am acting as scribe for [name], MD .   Documentation: I have reviewed the above documentation for accuracy and completeness, and  I agree with the above.  [name], MD

## 2023-03-28 NOTE — Patient Instructions (Signed)
Due to recent changes in healthcare laws, you may see results of your pathology and/or laboratory studies on MyChart before the doctors have had a chance to review them. We understand that in some cases there may be results that are confusing or concerning to you. Please understand that not all results are received at the same time and often the doctors may need to interpret multiple results in order to provide you with the best plan of care or course of treatment. Therefore, we ask that you please give us 2 business days to thoroughly review all your results before contacting the office for clarification. Should we see a critical lab result, you will be contacted sooner.   If You Need Anything After Your Visit  If you have any questions or concerns for your doctor, please call our main line at [phone] and press option 4 to reach your doctor's medical assistant. If no one answers, please leave a voicemail as directed and we will return your call as soon as possible. Messages left after 4 pm will be answered the following business day.   You may also send us a message via MyChart. We typically respond to MyChart messages within 1-2 business days.  For prescription refills, please ask your pharmacy to contact our office. Our fax number is [phone].  If you have an urgent issue when the clinic is closed that cannot wait until the next business day, you can page your doctor at the number below.    Please note that while we do our best to be available for urgent issues outside of office hours, we are not available 24/7.   If you have an urgent issue and are unable to reach us, you may choose to seek medical care at your doctor's office, retail clinic, urgent care center, or emergency room.  If you have a medical emergency, please immediately call 911 or go to the emergency department.  Pager Numbers  - Dr. Kowalski: [phone]  - Dr. Moye: [phone]  - Dr. Stewart:  [phone]  In the event of inclement weather, please call our main line at [phone] for an update on the status of any delays or closures.  Dermatology Medication Tips: Please keep the boxes that topical medications come in in order to help keep track of the instructions about where and how to use these. Pharmacies typically print the medication instructions only on the boxes and not directly on the medication tubes.   If your medication is too expensive, please contact our office at [phone] option 4 or send us a message through MyChart.   We are unable to tell what your co-pay for medications will be in advance as this is different depending on your insurance coverage. However, we may be able to find a substitute medication at lower cost or fill out paperwork to get insurance to cover a needed medication.   If a prior authorization is required to get your medication covered by your insurance company, please allow us 1-2 business days to complete this process.  Drug prices often vary depending on where the prescription is filled and some pharmacies may offer cheaper prices.  The website www.goodrx.com contains coupons for medications through different pharmacies. The prices here do not account for what the cost may be with help from insurance (it may be cheaper with your insurance), but the website can give you the price if you did not use any insurance.  - You can print the associated coupon and take it with   your prescription to the pharmacy.  - You may also stop by our office during regular business hours and pick up a GoodRx coupon card.  - If you need your prescription sent electronically to a different pharmacy, notify our office through [facility] MyChart or by phone at [phone] option 4.     Si Usted Necesita Algo Despus de Su Visita  Tambin puede enviarnos un mensaje a travs de MyChart. Por lo general respondemos a los mensajes de MyChart en el transcurso de 1 a 2  das hbiles.  Para renovar recetas, por favor pida a su farmacia que se ponga en contacto con nuestra oficina. Nuestro nmero de fax es el [phone].  Si tiene un asunto urgente cuando la clnica est cerrada y que no puede esperar hasta el siguiente da hbil, puede llamar/localizar a su doctor(a) al nmero que aparece a continuacin.   Por favor, tenga en cuenta que aunque hacemos todo lo posible para estar disponibles para asuntos urgentes fuera del horario de oficina, no estamos disponibles las 24 horas del da, los 7 das de la semana.   Si tiene un problema urgente y no puede comunicarse con nosotros, puede optar por buscar atencin mdica  en el consultorio de su doctor(a), en una clnica privada, en un centro de atencin urgente o en una sala de emergencias.  Si tiene una emergencia mdica, por favor llame inmediatamente al 911 o vaya a la sala de emergencias.  Nmeros de bper  - Dr. Kowalski: [phone]  - Dra. Moye: [phone]  - Dra. Stewart: [phone]  En caso de inclemencias del tiempo, por favor llame a nuestra lnea principal al [phone] para una actualizacin sobre el estado de cualquier retraso o cierre.  Consejos para la medicacin en dermatologa: Por favor, guarde las cajas en las que vienen los medicamentos de uso tpico para ayudarle a seguir las instrucciones sobre dnde y cmo usarlos. Las farmacias generalmente imprimen las instrucciones del medicamento slo en las cajas y no directamente en los tubos del medicamento.   Si su medicamento es muy caro, por favor, pngase en contacto con nuestra oficina llamando al [phone] y presione la opcin 4 o envenos un mensaje a travs de MyChart.   No podemos decirle cul ser su copago por los medicamentos por adelantado ya que esto es diferente dependiendo de la cobertura de su seguro. Sin embargo, es posible que podamos encontrar un medicamento sustituto a menor costo o llenar un formulario para que el  seguro cubra el medicamento que se considera necesario.   Si se requiere una autorizacin previa para que su compaa de seguros cubra su medicamento, por favor permtanos de 1 a 2 das hbiles para completar este proceso.  Los precios de los medicamentos varan con frecuencia dependiendo del lugar de dnde se surte la receta y alguna farmacias pueden ofrecer precios ms baratos.  El sitio web www.goodrx.com tiene cupones para medicamentos de diferentes farmacias. Los precios aqu no tienen en cuenta lo que podra costar con la ayuda del seguro (puede ser ms barato con su seguro), pero el sitio web puede darle el precio si no utiliz ningn seguro.  - Puede imprimir el cupn correspondiente y llevarlo con su receta a la farmacia.  - Tambin puede pasar por nuestra oficina durante el horario de atencin regular y recoger una tarjeta de cupones de GoodRx.  - Si necesita que su receta se enve electrnicamente a una farmacia diferente, informe a nuestra oficina a travs de MyChart de [facility]   o por telfono llamando al [phone] y presione la opcin 4.  

## 2023-03-29 ENCOUNTER — Encounter: Payer: Self-pay | Admitting: Family Medicine

## 2023-03-29 NOTE — Telephone Encounter (Signed)
appt

## 2023-04-01 ENCOUNTER — Encounter: Payer: Self-pay | Admitting: Dermatology

## 2023-04-01 NOTE — Telephone Encounter (Signed)
Called lmom for the pt to call the office to get schedule per the provider.

## 2023-04-03 NOTE — Telephone Encounter (Signed)
Called the pt to see if he was still needing to be seen for his back no answer unable to leave a msg.

## 2023-04-11 ENCOUNTER — Encounter: Payer: Self-pay | Admitting: Dermatology

## 2023-04-11 ENCOUNTER — Encounter: Payer: Self-pay | Admitting: Family Medicine

## 2023-04-11 ENCOUNTER — Ambulatory Visit (INDEPENDENT_AMBULATORY_CARE_PROVIDER_SITE_OTHER): Payer: Medicaid Other

## 2023-04-11 DIAGNOSIS — L209 Atopic dermatitis, unspecified: Secondary | ICD-10-CM | POA: Diagnosis not present

## 2023-04-11 MED ORDER — TRALOKINUMAB-LDRM 150 MG/ML ~~LOC~~ SOSY
300.0000 mg | PREFILLED_SYRINGE | Freq: Once | SUBCUTANEOUS | Status: AC
  Administered 2023-04-11: 300 mg via SUBCUTANEOUS

## 2023-04-11 NOTE — Progress Notes (Signed)
Patient here today for 2 week Adbry Injection for Severe Atopic Dermatitis.    Adbry 300mg /mL SQ Q2 wk  Adbry 300 mg/mL injected today to left upper arm. Patient tolerated well.    [place]: 48016-553-74 Lot: [id number] Exp: 02/2024   [name], RMA

## 2023-04-12 ENCOUNTER — Encounter: Payer: Self-pay | Admitting: Family Medicine

## 2023-04-15 ENCOUNTER — Encounter: Payer: Self-pay | Admitting: Family Medicine

## 2023-04-15 ENCOUNTER — Other Ambulatory Visit: Payer: Self-pay | Admitting: Family Medicine

## 2023-04-15 MED ORDER — CETIRIZINE HCL 10 MG PO TABS: 10.0000 mg | ORAL_TABLET | Freq: Every day | ORAL | 4 refills | Status: DC

## 2023-04-22 ENCOUNTER — Encounter: Payer: Self-pay | Admitting: Family Medicine

## 2023-04-24 ENCOUNTER — Other Ambulatory Visit: Payer: Self-pay | Admitting: Dermatology

## 2023-04-24 DIAGNOSIS — L209 Atopic dermatitis, unspecified: Secondary | ICD-10-CM

## 2023-04-25 ENCOUNTER — Ambulatory Visit (INDEPENDENT_AMBULATORY_CARE_PROVIDER_SITE_OTHER): Payer: Medicaid Other

## 2023-04-25 DIAGNOSIS — L209 Atopic dermatitis, unspecified: Secondary | ICD-10-CM

## 2023-04-25 MED ORDER — TRALOKINUMAB-LDRM 150 MG/ML ~~LOC~~ SOSY
300.0000 mg | PREFILLED_SYRINGE | Freq: Once | SUBCUTANEOUS | Status: AC
Start: 2023-04-25 — End: 2023-04-25
  Administered 2023-04-25: 300 mg via SUBCUTANEOUS

## 2023-04-25 NOTE — Progress Notes (Signed)
Patient here today for 2 week Adbry Injection for Severe Atopic Dermatitis.    Adbry /mL SQ Q2 wk  Adbry 300 mg/mL injected today to right upper arm. Patient tolerated well.    ND[place]: 78295-621-30 Lot: [id number] Exp: 02/2024   Evorn Gong, RMA

## 2023-04-26 ENCOUNTER — Ambulatory Visit (INDEPENDENT_AMBULATORY_CARE_PROVIDER_SITE_OTHER): Payer: Medicaid Other | Admitting: Family Medicine

## 2023-04-26 ENCOUNTER — Encounter: Payer: Self-pay | Admitting: Family Medicine

## 2023-04-26 VITALS — BP 115/67 | HR 94 | Temp 97.5°F | Ht 64.0 in | Wt 202.3 lb

## 2023-04-26 DIAGNOSIS — J301 Allergic rhinitis due to pollen: Secondary | ICD-10-CM

## 2023-04-26 DIAGNOSIS — M1A09X0 Idiopathic chronic gout, multiple sites, without tophus (tophi): Secondary | ICD-10-CM

## 2023-04-26 DIAGNOSIS — K219 Gastro-esophageal reflux disease without esophagitis: Secondary | ICD-10-CM

## 2023-04-26 DIAGNOSIS — E782 Mixed hyperlipidemia: Secondary | ICD-10-CM

## 2023-04-26 DIAGNOSIS — I10 Essential (primary) hypertension: Secondary | ICD-10-CM

## 2023-04-26 MED ORDER — ATORVASTATIN CALCIUM 20 MG PO TABS: 20.0000 mg | ORAL_TABLET | Freq: Every day | ORAL | 1 refills | Status: DC

## 2023-04-26 MED ORDER — ALLOPURINOL 300 MG PO TABS: 300.0000 mg | ORAL_TABLET | Freq: Every day | ORAL | 1 refills | Status: DC

## 2023-04-26 MED ORDER — TRIAMCINOLONE ACETONIDE 40 MG/ML IJ SUSP
40.0000 mg | Freq: Once | INTRAMUSCULAR | Status: AC
Start: 2023-04-26 — End: 2023-04-26
  Administered 2023-04-26: 40 mg via INTRAMUSCULAR

## 2023-04-26 MED ORDER — CETIRIZINE HCL 10 MG PO TABS: 10.0000 mg | ORAL_TABLET | Freq: Every day | ORAL | 4 refills | Status: DC

## 2023-04-26 MED ORDER — LISINOPRIL 10 MG PO TABS: 10.0000 mg | ORAL_TABLET | Freq: Every day | ORAL | 1 refills | Status: DC

## 2023-04-26 MED ORDER — PREDNISONE 50 MG PO TABS: 50.0000 mg | ORAL_TABLET | Freq: Every day | ORAL | 0 refills | Status: DC

## 2023-04-26 MED ORDER — HYDROXYZINE HCL 10 MG PO TABS: ORAL_TABLET | ORAL | 1 refills | Status: DC

## 2023-04-26 NOTE — Assessment & Plan Note (Signed)
Under good control on current regimen. Continue current regimen. Continue to monitor. Call with any concerns. Refills given. Labs drawn today.   

## 2023-04-26 NOTE — Progress Notes (Signed)
BP 115/67   Pulse 94   Temp (!) 97.5 F (36.4 C) (Oral)   Ht 5\' 4"  (1.626 m)   Wt 202 lb 4.8 oz (91.8 kg)   SpO2 97%   BMI 34.72 kg/m    Subjective:    Patient ID: Jesus Guerrero, male    DOB: 08/22/1971, 52 y.o.   MRN: [id number]  HPI: Jesus Guerrero is a 52 y.o. male  Chief Complaint  Patient presents with   Hypertension   Hyperlipidemia   UPPER RESPIRATORY TRACT INFECTION Duration: 1 days Worst symptom: cough, hoarseness Fever: no Cough: yes Shortness of breath: no Wheezing: no Chest pain: no Chest tightness: no Chest congestion: yes Nasal congestion: yes Runny nose: yes Post nasal drip: yes Sneezing: no Sore throat: no Swollen glands: no Sinus pressure: no Headache: no Face pain: no Toothache: no Ear pain: no  Ear pressure: no  Eyes red/itching:yes Eye drainage/crusting: no  Vomiting: no Rash: no Fatigue: yes Sick contacts: no Strep contacts: no  Context: worse Recurrent sinusitis: no Relief with OTC cold/cough medications: no  Treatments attempted: none   No gout flares. Tolerating medicine well.   HYPERTENSION / HYPERLIPIDEMIA Satisfied with current treatment? yes Duration of hypertension: chronic BP monitoring frequency: not checking BP medication side effects: no Past BP meds: lisinopril Duration of hyperlipidemia: chronic Cholesterol medication side effects: no Cholesterol supplements: none Past cholesterol medications: allopurinol Medication compliance: excellent compliance Aspirin: no Recent stressors: no Recurrent headaches: no Visual changes: no Palpitations: no Dyspnea: no Chest pain: no Lower extremity edema: no Dizzy/lightheaded: no   Relevant past medical, surgical, family and social history reviewed and updated as indicated. Interim medical history since our last visit reviewed. Allergies and medications reviewed and updated.  Review of Systems  Constitutional: Negative.   Respiratory: Negative.     Cardiovascular: Negative.   Gastrointestinal: Negative.   Musculoskeletal: Negative.   Neurological: Negative.   Psychiatric/Behavioral: Negative.      Per HPI unless specifically indicated above     Objective:    BP 115/67   Pulse 94   Temp (!) 97.5 F (36.4 C) (Oral)   Ht 5\' 4"  (1.626 m)   Wt 202 lb 4.8 oz (91.8 kg)   SpO2 97%   BMI 34.72 kg/m   Wt Readings from Last 3 Encounters:  04/26/23 202 lb 4.8 oz (91.8 kg)  01/25/23 198 lb 4.8 oz (89.9 kg)  10/24/22 196 lb 12.8 oz (89.3 kg)    Physical Exam Vitals and nursing note reviewed.  Constitutional:      General: He is not in acute distress.    Appearance: Normal appearance. He is not ill-appearing, toxic-appearing or diaphoretic.  HENT:     Head: Normocephalic and atraumatic.     Right Ear: Tympanic membrane, ear canal and external ear normal.     Left Ear: Tympanic membrane, ear canal and external ear normal.     Nose: Rhinorrhea present.     Comments: Pale turbinates    Mouth/Throat:     Mouth: Mucous membranes are moist.     Pharynx: Oropharynx is clear.  Eyes:     General: No scleral icterus.       Right eye: No discharge.        Left eye: No discharge.     Extraocular Movements: Extraocular movements intact.     Conjunctiva/sclera: Conjunctivae normal.     Pupils: Pupils are equal, round, and reactive to light.  Cardiovascular:     Rate  and Rhythm: Normal rate and regular rhythm.     Pulses: Normal pulses.     Heart sounds: Normal heart sounds. No murmur heard.    No friction rub. No gallop.  Pulmonary:     Effort: Pulmonary effort is normal. No respiratory distress.     Breath sounds: Normal breath sounds. No stridor. No wheezing, rhonchi or rales.  Chest:     Chest wall: No tenderness.  Musculoskeletal:        General: Normal range of motion.     Cervical back: Normal range of motion and neck supple.  Skin:    General: Skin is warm and dry.     Capillary Refill: Capillary refill takes less than  2 seconds.     Coloration: Skin is not jaundiced or pale.     Findings: No bruising, erythema, lesion or rash.  Neurological:     General: No focal deficit present.     Mental Status: He is alert and oriented to person, place, and time. Mental status is at baseline.  Psychiatric:        Mood and Affect: Mood normal.        Behavior: Behavior normal.        Thought Content: Thought content normal.        Judgment: Judgment normal.     Results for orders placed or performed in visit on 01/25/23  CBC with Differential/Platelet  Result Value Ref Range   WBC 8.8 3.4 - 10.8 x10E3/uL   RBC 4.71 4.14 - 5.80 x10E6/uL   Hemoglobin 14.5 13.0 - 17.7 g/dL   Hematocrit 16.1 09.6 - 51.0 %   MCV 90 79 - 97 fL   MCH 30.8 26.6 - 33.0 pg   MCHC 34.2 31.5 - 35.7 g/dL   RDW 04.5 40.9 - 81.1 %   Platelets 279 150 - 450 x10E3/uL   Neutrophils 71 Not Estab. %   Lymphs 22 Not Estab. %   Monocytes 5 Not Estab. %   Eos 1 Not Estab. %   Basos 1 Not Estab. %   Neutrophils Absolute 6.2 1.4 - 7.0 x10E3/uL   Lymphocytes Absolute 1.9 0.7 - 3.1 x10E3/uL   Monocytes Absolute 0.5 0.1 - 0.9 x10E3/uL   EOS (ABSOLUTE) 0.1 0.0 - 0.4 x10E3/uL   Basophils Absolute 0.1 0.0 - 0.2 x10E3/uL   Immature Granulocytes 0 Not Estab. %   Immature Grans (Abs) 0.0 0.0 - 0.1 x10E3/uL  Comprehensive metabolic panel  Result Value Ref Range   Glucose 124 (H) 70 - 99 mg/dL   BUN 23 6 - 24 mg/dL   Creatinine, Ser 9.14 0.76 - 1.27 mg/dL   eGFR 84 >78 GN/FAO/1.30   BUN/Creatinine Ratio 22 (H) 9 - 20   Sodium 139 134 - 144 mmol/L   Potassium 4.9 3.5 - 5.2 mmol/L   Chloride 102 96 - 106 mmol/L   CO2 21 20 - 29 mmol/L   Calcium 9.4 8.7 - 10.2 mg/dL   Total Protein 7.1 6.0 - 8.5 g/dL   Albumin 4.4 3.8 - 4.9 g/dL   Globulin, Total 2.7 1.5 - 4.5 g/dL   Albumin/Globulin Ratio 1.6 1.2 - 2.2   Bilirubin Total 0.4 0.0 - 1.2 mg/dL   Alkaline Phosphatase 117 44 - 121 IU/L   AST 21 0 - 40 IU/L   ALT 29 0 - 44 IU/L  Lipid Panel w/o  Chol/HDL Ratio  Result Value Ref Range   Cholesterol, Total 173 100 - 199 mg/dL  Triglycerides 518 (H) 0 - 149 mg/dL   HDL 31 (L) >16 mg/dL   VLDL Cholesterol Cal 79 (H) 5 - 40 mg/dL   LDL Chol Calc (NIH) 63 0 - 99 mg/dL  Uric acid  Result Value Ref Range   Uric Acid 4.4 3.8 - 8.4 mg/dL   *Note: Due to a large number of results and/or encounters for the requested time period, some results have not been displayed. A complete set of results can be found in Results Review.      Assessment & Plan:   Problem List Items Addressed This Visit       Cardiovascular and Mediastinum   Primary hypertension    Under good control on current regimen. Continue current regimen. Continue to monitor. Call with any concerns. Refills given. Labs drawn today.        Relevant Medications   atorvastatin (LIPITOR) 20 MG tablet   lisinopril (ZESTRIL) 10 MG tablet     Respiratory   Allergic rhinitis    In exacerbation. Will start him on prednisone. Call with any concerns. Continue to monitor.         Digestive   GERD (gastroesophageal reflux disease)    Under good control on current regimen. Continue current regimen. Continue to monitor. Call with any concerns. Refills given. Labs drawn today.         Other   Hyperlipidemia    Under good control on current regimen. Continue current regimen. Continue to monitor. Call with any concerns. Refills given. Labs drawn today.       Relevant Medications   atorvastatin (LIPITOR) 20 MG tablet   lisinopril (ZESTRIL) 10 MG tablet   Chronic gout - Primary    Under good control on current regimen. Continue current regimen. Continue to monitor. Call with any concerns. Refills given. Labs drawn today.         Follow up plan: Return in about 3 months (around 07/26/2023).

## 2023-04-26 NOTE — Assessment & Plan Note (Signed)
In exacerbation. Will start him on prednisone. Call with any concerns. Continue to monitor.

## 2023-04-27 ENCOUNTER — Encounter: Payer: Self-pay | Admitting: Family Medicine

## 2023-04-29 ENCOUNTER — Encounter: Payer: Self-pay | Admitting: Family Medicine

## 2023-04-29 ENCOUNTER — Other Ambulatory Visit: Payer: Self-pay | Admitting: Family Medicine

## 2023-04-29 MED ORDER — FLUTICASONE PROPIONATE 50 MCG/ACT NA SUSP: 2.0000 | Freq: Every day | NASAL | 12 refills | Status: DC

## 2023-05-02 NOTE — Telephone Encounter (Signed)
Called patient to scheduled for an appointment, he stated he would call back so that he will have transportation.

## 2023-05-03 ENCOUNTER — Encounter: Payer: Self-pay | Admitting: Family Medicine

## 2023-05-05 ENCOUNTER — Encounter: Payer: Self-pay | Admitting: Family Medicine

## 2023-05-06 ENCOUNTER — Encounter: Payer: Self-pay | Admitting: Nurse Practitioner

## 2023-05-06 ENCOUNTER — Ambulatory Visit: Payer: Medicaid Other | Admitting: Nurse Practitioner

## 2023-05-06 VITALS — BP 132/72 | HR 99 | Temp 98.1°F | Wt 199.0 lb

## 2023-05-06 DIAGNOSIS — J0110 Acute frontal sinusitis, unspecified: Secondary | ICD-10-CM | POA: Diagnosis not present

## 2023-05-06 MED ORDER — AMOXICILLIN 500 MG PO CAPS: 500.0000 mg | ORAL_CAPSULE | Freq: Two times a day (BID) | ORAL | 0 refills | Status: AC

## 2023-05-06 MED ORDER — BENZONATATE 100 MG PO CAPS: 100.0000 mg | ORAL_CAPSULE | Freq: Two times a day (BID) | ORAL | 0 refills | Status: DC | PRN

## 2023-05-06 NOTE — Progress Notes (Signed)
BP 132/72   Pulse 99   Temp 98.1 F (36.7 C) (Oral)   Wt 199 lb (90.3 kg)   SpO2 96%   BMI 34.16 kg/m    Subjective:    Patient ID: Jesus Guerrero, male    DOB: [birth year], 52 y.o.   MRN: [id number]  HPI: [name] is a 52 y.o. male  Chief Complaint  Patient presents with   Cough    Pt states has been coughing for a week now has been taking OC meds nothing is helping    Nasal Congestion   UPPER RESPIRATORY TRACT INFECTION Worst symptom: Symptoms started a week ago.  Has been taking over the counter medications which haven't been helpful.   Fever: no Cough: yes Shortness of breath: no Wheezing: no Chest pain: yes from coughing Chest tightness: no Chest congestion: yes Nasal congestion: yes Runny nose: yes Post nasal drip: yes Sneezing: yes Sore throat: no Swollen glands: no Sinus pressure: yes Headache: yes Face pain: yes Toothache: no Ear pain: no bilateral Ear pressure: yes bilateral Eyes red/itching:yes Eye drainage/crusting: no  Vomiting: no Rash: no Fatigue: yes Sick contacts: no Strep contacts: no  Context: worse Recurrent sinusitis: no Relief with OTC cold/cough medications: yes  Treatments attempted: cold/sinus   Relevant past medical, surgical, family and social history reviewed and updated as indicated. Interim medical history since our last visit reviewed. Allergies and medications reviewed and updated.  Review of Systems  Constitutional:  Positive for fatigue. Negative for fever.  HENT:  Positive for congestion, postnasal drip, rhinorrhea, sinus pressure, sinus pain and sneezing. Negative for ear pain and sore throat.   Eyes:  Positive for redness and itching. Negative for pain and discharge.  Respiratory:  Positive for cough. Negative for chest tightness, shortness of breath and wheezing.   Gastrointestinal:  Negative for vomiting.  Skin:  Negative for rash.  Neurological:  Positive for headaches.    Per HPI unless  specifically indicated above     Objective:    BP 132/72   Pulse 99   Temp 98.1 F (36.7 C) (Oral)   Wt 199 lb (90.3 kg)   SpO2 96%   BMI 34.16 kg/m   Wt Readings from Last 3 Encounters:  05/06/23 199 lb (90.3 kg)  04/26/23 202 lb 4.8 oz (91.8 kg)  01/25/23 198 lb 4.8 oz (89.9 kg)    Physical Exam Vitals and nursing note reviewed.  Constitutional:      General: He is not in acute distress.    Appearance: Normal appearance. He is not ill-appearing, toxic-appearing or diaphoretic.  HENT:     Head: Normocephalic.     Right Ear: External ear normal. A middle ear effusion is present. Tympanic membrane is not erythematous.     Left Ear: External ear normal. A middle ear effusion is present. Tympanic membrane is not erythematous.     Nose: No congestion or rhinorrhea.     Right Sinus: Frontal sinus tenderness present. No maxillary sinus tenderness.     Left Sinus: Frontal sinus tenderness present. No maxillary sinus tenderness.     Mouth/Throat:     Mouth: Mucous membranes are moist.     Pharynx: Posterior oropharyngeal erythema present. No oropharyngeal exudate.  Eyes:     General:        Right eye: No discharge.        Left eye: No discharge.     Extraocular Movements: Extraocular movements intact.     Conjunctiva/sclera: Conjunctivae  normal.     Pupils: Pupils are equal, round, and reactive to light.  Cardiovascular:     Rate and Rhythm: Normal rate and regular rhythm.     Heart sounds: No murmur heard. Pulmonary:     Effort: Pulmonary effort is normal. No respiratory distress.     Breath sounds: Normal breath sounds. No wheezing, rhonchi or rales.  Abdominal:     General: Abdomen is flat. Bowel sounds are normal.  Musculoskeletal:     Cervical back: Normal range of motion and neck supple.  Skin:    General: Skin is warm and dry.     Capillary Refill: Capillary refill takes less than 2 seconds.  Neurological:     General: No focal deficit present.     Mental Status:  He is alert and oriented to person, place, and time.  Psychiatric:        Mood and Affect: Mood normal.        Behavior: Behavior normal.        Thought Content: Thought content normal.        Judgment: Judgment normal.     Results for orders placed or performed in visit on 01/25/23  CBC with Differential/Platelet  Result Value Ref Range   WBC 8.8 3.4 - 10.8 x10E3/uL   RBC 4.71 4.14 - 5.80 x10E6/uL   Hemoglobin 14.5 13.0 - 17.7 g/dL   Hematocrit 60.4 54.0 - 51.0 %   MCV 90 79 - 97 fL   MCH 30.8 26.6 - 33.0 pg   MCHC 34.2 31.5 - 35.7 g/dL   RDW 98.1 19.1 - 47.8 %   Platelets 279 150 - 450 x10E3/uL   Neutrophils 71 Not Estab. %   Lymphs 22 Not Estab. %   Monocytes 5 Not Estab. %   Eos 1 Not Estab. %   Basos 1 Not Estab. %   Neutrophils Absolute 6.2 1.4 - 7.0 x10E3/uL   Lymphocytes Absolute 1.9 0.7 - 3.1 x10E3/uL   Monocytes Absolute 0.5 0.1 - 0.9 x10E3/uL   EOS (ABSOLUTE) 0.1 0.0 - 0.4 x10E3/uL   Basophils Absolute 0.1 0.0 - 0.2 x10E3/uL   Immature Granulocytes 0 Not Estab. %   Immature Grans (Abs) 0.0 0.0 - 0.1 x10E3/uL  Comprehensive metabolic panel  Result Value Ref Range   Glucose 124 (H) 70 - 99 mg/dL   BUN 23 6 - 24 mg/dL   Creatinine, Ser 2.95 0.76 - 1.27 mg/dL   eGFR 84 >62 ZH/YQM/5.78   BUN/Creatinine Ratio 22 (H) 9 - 20   Sodium 139 134 - 144 mmol/L   Potassium 4.9 3.5 - 5.2 mmol/L   Chloride 102 96 - 106 mmol/L   CO2 21 20 - 29 mmol/L   Calcium 9.4 8.7 - 10.2 mg/dL   Total Protein 7.1 6.0 - 8.5 g/dL   Albumin 4.4 3.8 - 4.9 g/dL   Globulin, Total 2.7 1.5 - 4.5 g/dL   Albumin/Globulin Ratio 1.6 1.2 - 2.2   Bilirubin Total 0.4 0.0 - 1.2 mg/dL   Alkaline Phosphatase 117 44 - 121 IU/L   AST 21 0 - 40 IU/L   ALT 29 0 - 44 IU/L  Lipid Panel w/o Chol/HDL Ratio  Result Value Ref Range   Cholesterol, Total 173 100 - 199 mg/dL   Triglycerides 469 (H) 0 - 149 mg/dL   HDL 31 (L) >62 mg/dL   VLDL Cholesterol Cal 79 (H) 5 - 40 mg/dL   LDL Chol Calc (NIH) 63 0 - 99  mg/dL  Uric acid  Result Value Ref Range   Uric Acid 4.4 3.8 - 8.4 mg/dL   *Note: Due to a large number of results and/or encounters for the requested time period, some results have not been displayed. A complete set of results can be found in Results Review.      Assessment & Plan:   Problem List Items Addressed This Visit   None Visit Diagnoses     Acute non-recurrent frontal sinusitis    -  Primary   Will treat with amoxicillin.  Complete course of antibiotics.  Okay to use Tessalon PRN for cough.  Follow up if not improved.   Relevant Medications   amoxicillin (AMOXIL) 500 MG capsule   benzonatate (TESSALON) 100 MG capsule        Follow up plan: No follow-ups on file.

## 2023-05-07 NOTE — Telephone Encounter (Signed)
Patient was seen 05/06/2023 @ 2:00 for this concern.

## 2023-05-11 ENCOUNTER — Encounter: Payer: Self-pay | Admitting: Dermatology

## 2023-05-16 ENCOUNTER — Ambulatory Visit: Payer: Medicaid Other | Admitting: Dermatology

## 2023-05-16 ENCOUNTER — Other Ambulatory Visit: Payer: Self-pay

## 2023-05-16 DIAGNOSIS — Z1283 Encounter for screening for malignant neoplasm of skin: Secondary | ICD-10-CM

## 2023-05-16 DIAGNOSIS — S0031XA Abrasion of nose, initial encounter: Secondary | ICD-10-CM

## 2023-05-16 DIAGNOSIS — X32XXXA Exposure to sunlight, initial encounter: Secondary | ICD-10-CM

## 2023-05-16 DIAGNOSIS — L739 Follicular disorder, unspecified: Secondary | ICD-10-CM

## 2023-05-16 DIAGNOSIS — D1801 Hemangioma of skin and subcutaneous tissue: Secondary | ICD-10-CM | POA: Diagnosis not present

## 2023-05-16 DIAGNOSIS — L821 Other seborrheic keratosis: Secondary | ICD-10-CM

## 2023-05-16 DIAGNOSIS — W908XXA Exposure to other nonionizing radiation, initial encounter: Secondary | ICD-10-CM

## 2023-05-16 DIAGNOSIS — Z86018 Personal history of other benign neoplasm: Secondary | ICD-10-CM

## 2023-05-16 DIAGNOSIS — L578 Other skin changes due to chronic exposure to nonionizing radiation: Secondary | ICD-10-CM

## 2023-05-16 DIAGNOSIS — L814 Other melanin hyperpigmentation: Secondary | ICD-10-CM | POA: Diagnosis not present

## 2023-05-16 DIAGNOSIS — L209 Atopic dermatitis, unspecified: Secondary | ICD-10-CM

## 2023-05-16 DIAGNOSIS — L304 Erythema intertrigo: Secondary | ICD-10-CM

## 2023-05-16 MED ORDER — TRALOKINUMAB-LDRM 150 MG/ML ~~LOC~~ SOSY
300.0000 mg | PREFILLED_SYRINGE | Freq: Once | SUBCUTANEOUS | Status: AC
Start: 2023-05-16 — End: 2023-05-16
  Administered 2023-05-16: 300 mg via SUBCUTANEOUS

## 2023-05-16 MED ORDER — ADBRY 150 MG/ML ~~LOC~~ SOSY: 300.0000 mg | PREFILLED_SYRINGE | SUBCUTANEOUS | 4 refills | Status: DC

## 2023-05-16 NOTE — Progress Notes (Signed)
Refill request faxed from [facility]. [facility]  

## 2023-05-16 NOTE — Patient Instructions (Addendum)
Continue Adbry 300 mg every 2 weeks as directed.  Continue Opzelura and and Halobetasol as directed as needed.   Topical steroids (such as triamcinolone, fluocinolone, fluocinonide, mometasone, clobetasol, halobetasol, betamethasone, hydrocortisone) can cause thinning and lightening of the skin if they are used for too long in the same area. Your physician has selected the right strength medicine for your problem and area affected on the body. Please use your medication only as directed by your physician to prevent side effects.    Tralokinumab Carvel Getting) is a treatment given by injection for adults with moderate-to-severe atopic dermatitis. Goal is control of skin condition, not cure. It is given as 4 injections at the first dose followed by 2 injections ever 2 weeks thereafter.  Potential side effects include allergic reaction, injection site reactions and conjunctivitis (inflammation of the eyes).  The use of Adbry requires long term medication management, including periodic office visits.   Superficial Ulceration Left face Avoid picking at area.  Apply Vaseline Jelly daily and cover with band-aid daily until healed.   Spot on abdomen and spot on left side: Apply Mupirocin twice daily until healed.   Recommend N-acetylcysteine (NAC) 600 mg supplement three times per day to help with picking     Recommend daily broad spectrum sunscreen SPF 30+ to sun-exposed areas, reapply every 2 hours as needed. Call for new or changing lesions.  Staying in the shade or wearing long sleeves, sun glasses (UVA+UVB protection) and wide brim hats (4-inch brim around the entire circumference of the hat) are also recommended for sun protection.    Melanoma ABCDEs  Melanoma is the most dangerous type of skin cancer, and is the leading cause of death from skin disease.  You are more likely to develop melanoma if you: Have light-colored skin, light-colored eyes, or red or blond hair Spend a lot of time in  the sun Tan regularly, either outdoors or in a tanning bed Have had blistering sunburns, especially during childhood Have a close family member who has had a melanoma Have atypical moles or large birthmarks  Early detection of melanoma is key since treatment is typically straightforward and cure rates are extremely high if we catch it early.   The first sign of melanoma is often a change in a mole or a new dark spot.  The ABCDE system is a way of remembering the signs of melanoma.  A for asymmetry:  The two halves do not match. B for border:  The edges of the growth are irregular. C for color:  A mixture of colors are present instead of an even brown color. D for diameter:  Melanomas are usually (but not always) greater than 6mm - the size of a pencil eraser. E for evolution:  The spot keeps changing in size, shape, and color.  Please check your skin once per month between visits. You can use a small mirror in front and a large mirror behind you to keep an eye on the back side or your body.   If you see any new or changing lesions before your next follow-up, please call to schedule a visit.  Please continue daily skin protection including broad spectrum sunscreen SPF 30+ to sun-exposed areas, reapplying every 2 hours as needed when you're outdoors.   Staying in the shade or wearing long sleeves, sun glasses (UVA+UVB protection) and wide brim hats (4-inch brim around the entire circumference of the hat) are also recommended for sun protection.    Due to recent changes in  healthcare laws, you may see results of your pathology and/or laboratory studies on MyChart before the doctors have had a chance to review them. We understand that in some cases there may be results that are confusing or concerning to you. Please understand that not all results are received at the same time and often the doctors may need to interpret multiple results in order to provide you with the best plan of care or course  of treatment. Therefore, we ask that you please give [place] 2 business days to thoroughly review all your results before contacting the office for clarification. Should we see a critical lab result, you will be contacted sooner.   If You Need Anything After Your Visit  If you have any questions or concerns for your doctor, please call our main line at [phone] and press option 4 to reach your doctor's medical assistant. If no one answers, please leave a voicemail as directed and we will return your call as soon as possible. Messages left after 4 pm will be answered the following business day.   You may also send [place] a message via MyChart. We typically respond to MyChart messages within 1-2 business days.  For prescription refills, please ask your pharmacy to contact our office. Our fax number is [phone].  If you have an urgent issue when the clinic is closed that cannot wait until the next business day, you can page your doctor at the number below.    Please note that while we do our best to be available for urgent issues outside of office hours, we are not available 24/7.   If you have an urgent issue and are unable to reach [place], you may choose to seek medical care at your doctor's office, retail clinic, urgent care center, or emergency room.  If you have a medical emergency, please immediately call 911 or go to the emergency department.  Pager Numbers  - Dr. Gwen Pounds: [phone]  - Dr. Neale Burly: [phone]  - Dr. Roseanne Reno: [phone]  In the event of inclement weather, please call our main line at [phone] for an update on the status of any delays or closures.  Dermatology Medication Tips: Please keep the boxes that topical medications come in in order to help keep track of the instructions about where and how to use these. Pharmacies typically print the medication instructions only on the boxes and not directly on the medication tubes.   If your medication is too expensive,  please contact our office at [phone] option 4 or send [place] a message through MyChart.   We are unable to tell what your co-pay for medications will be in advance as this is different depending on your insurance coverage. However, we may be able to find a substitute medication at lower cost or fill out paperwork to get insurance to cover a needed medication.   If a prior authorization is required to get your medication covered by your insurance company, please allow [place] 1-2 business days to complete this process.  Drug prices often vary depending on where the prescription is filled and some pharmacies may offer cheaper prices.  The website www.goodrx.com contains coupons for medications through different pharmacies. The prices here do not account for what the cost may be with help from insurance (it may be cheaper with your insurance), but the website can give you the price if you did not use any insurance.  - You can print the associated coupon and take it with your prescription to the  pharmacy.  - You may also stop by our office during regular business hours and pick up a GoodRx coupon card.  - If you need your prescription sent electronically to a different pharmacy, notify our office through [facility] or by phone at [phone] option 4.     Si Usted Necesita Algo Despus de Su Visita  Tambin puede enviarnos un mensaje a travs de Clinical cytogeneticist. Por lo general respondemos a los mensajes de MyChart en el transcurso de 1 a 2 das hbiles.  Para renovar recetas, por favor pida a su farmacia que se ponga en contacto con nuestra oficina. Annie Sable de fax es [place] [phone].  Si tiene un asunto urgente cuando la clnica est cerrada y que no puede esperar hasta el siguiente da hbil, puede llamar/localizar a su doctor(a) al nmero que aparece a continuacin.   Por favor, tenga en cuenta que aunque hacemos todo lo posible para estar disponibles para asuntos urgentes fuera del  horario de [place], no estamos disponibles las 24 horas del da, los 7 [address]  [address] de la [place].   Si tiene un problema urgente y no puede comunicarse con nosotros, puede optar por buscar atencin mdica  en el consultorio de su doctor(a), en una clnica privada, en un centro de atencin urgente o en una sala de emergencias.  Si tiene Engineer, drilling, por favor llame inmediatamente al 911 o vaya a la sala de emergencias.  Nmeros de bper  - Dr. Gwen Pounds: [phone]  - Dra. Moye: [phone]  - Dra. Roseanne Reno: [phone]  En caso de inclemencias del [place], por favor llame a Lacy Duverney principal al [phone] para una actualizacin sobre el [place] de cualquier retraso o cierre.  Consejos para la medicacin en dermatologa: Por favor, guarde las cajas en las que vienen los medicamentos de uso tpico para ayudarle a seguir las instrucciones sobre dnde y cmo usarlos. Las farmacias generalmente imprimen las instrucciones del medicamento slo en las cajas y no directamente en los tubos del [place].   Si su medicamento es muy caro, por favor, pngase en contacto con Rolm Gala llamando al [phone] y presione la opcin 4 o envenos un mensaje a travs de Clinical cytogeneticist.   No podemos decirle cul ser su copago por los medicamentos por adelantado ya que esto es diferente dependiendo de la cobertura de su seguro. Sin embargo, es posible que podamos encontrar un medicamento sustituto a Audiological scientist un formulario para que el seguro cubra el medicamento que se considera necesario.   Si se requiere una autorizacin previa para que su compaa de seguros [place] su medicamento, por favor permtanos de 1 a 2 das hbiles para completar [address]  Los precios de los medicamentos varan con frecuencia dependiendo del Environmental consultant de dnde se surte la receta y alguna farmacias pueden ofrecer precios ms baratos.  El sitio web www.goodrx.com tiene cupones para medicamentos de Engineer, civil (consulting). Los precios aqu no tienen en cuenta lo que podra costar con la ayuda del seguro (puede ser ms barato con su seguro), pero el sitio web puede darle el precio si no utiliz Tourist information centre manager.  - Puede imprimir el cupn correspondiente y llevarlo con su receta a la farmacia.  - Tambin puede pasar por nuestra oficina durante el horario de atencin regular y Education officer, museum una tarjeta de cupones de GoodRx.  - Si necesita que su receta se enve electrnicamente a Psychiatrist, informe a nuestra oficina a travs de MyChart de [place] o por telfono llamando  al [phone] y presione la opcin 4.

## 2023-05-16 NOTE — Progress Notes (Signed)
Follow-Up Visit   Subjective  Jesus Guerrero is a 52 y.o. male who presents for the following: Skin Cancer Screening and Full Body Skin Exam. Hx of dysplastic nevi  Atopic dermatitis follow up. On Adbry 300 mg every 2 weeks. Controlling well. Denies adverse reactions, denies injection site reactions.    The patient presents for Total-Body Skin Exam (TBSE) for skin cancer screening and mole check. The patient has spots, moles and lesions to be evaluated, some may be new or changing and the patient has concerns that these could be cancer.   The following portions of the chart were reviewed this encounter and updated as appropriate: medications, allergies, medical history  Review of Systems:  No other skin or systemic complaints except as noted in HPI or Assessment and Plan.  Objective  Well appearing patient in no apparent distress; mood and affect are within normal limits.  A full examination was performed including scalp, head, eyes, ears, nose, lips, neck, chest, axillae, abdomen, back, buttocks, bilateral upper extremities, bilateral lower extremities, hands, feet, fingers, toes, fingernails, and toenails. All findings within normal limits unless otherwise noted below.   Relevant physical exam findings are noted in the Assessment and Plan.    Assessment & Plan   HISTORY OF DYSPLASTIC NEVI.  No evidence of recurrence today Recommend regular full body skin exams Recommend daily broad spectrum sunscreen SPF 30+ to sun-exposed areas, reapply every 2 hours as needed.  Call if any new or changing lesions are noted between office visits   LENTIGINES, SEBORRHEIC KERATOSES, HEMANGIOMAS - Benign normal skin lesions - Benign-appearing - Call for any changes  MELANOCYTIC NEVI - Tan-brown and/or pink-flesh-colored symmetric macules and papules - Benign appearing on exam today - Observation - Call clinic for new or changing moles - Recommend daily use of broad spectrum spf 30+  sunscreen to sun-exposed areas.   ACTINIC DAMAGE - Chronic condition, secondary to cumulative UV/sun exposure - diffuse scaly erythematous macules with underlying dyspigmentation - Recommend daily broad spectrum sunscreen SPF 30+ to sun-exposed areas, reapply every 2 hours as needed.  - Staying in the shade or wearing long sleeves, sun glasses (UVA+UVB protection) and wide brim hats (4-inch brim around the entire circumference of the hat) are also recommended for sun protection.  - Call for new or changing lesions.  SKIN CANCER SCREENING PERFORMED TODAY.  ATOPIC DERMATITIS Exam: Scaly pink papules coalescing to plaques 5% BSA  Chronic condition with duration or expected duration over one year. Currently well-controlled. Improved since increasing Adbry to q2 weeks. He defer Rinvoq and Cibinqo due to inability to take pills.  Atopic dermatitis (eczema) is a chronic, relapsing, pruritic condition that can significantly affect quality of life. It is often associated with allergic rhinitis and/or asthma and can require treatment with topical medications, phototherapy, or in severe cases biologic injectable medication (Dupixent; Adbry) or Oral JAK inhibitors.  Treatment Plan: Continue Adbry 300 mg every 2 weeks as directed.  Continue Opzelura and and Halobetasol as directed as needed.   Topical steroids (such as triamcinolone, fluocinolone, fluocinonide, mometasone, clobetasol, halobetasol, betamethasone, hydrocortisone) can cause thinning and lightening of the skin if they are used for too long in the same area. Your physician has selected the right strength medicine for your problem and area affected on the body. Please use your medication only as directed by your physician to prevent side effects.    Tralokinumab Carvel Getting) is a treatment given by injection for adults with moderate-to-severe atopic dermatitis. Goal is control  of skin condition, not cure. It is given as 4 injections at the first  dose followed by 2 injections ever 2 weeks thereafter.  Potential side effects include allergic reaction, injection site reactions and conjunctivitis (inflammation of the eyes).  The use of Adbry requires long term medication management, including periodic office visits.   Adbry 300 mg/94mL injected today to left upper arm. Patient tolerated well. Lot #[id number] Exp: 04/2024  Recommend gentle skin care.  Superficial Excoriation Left nasofacial angle  Avoid picking at area.  Apply Vaseline Jelly daily and cover with band-aid daily until healed.   INTERTRIGO Exam Erythematous macerated patches at axillae  Negative Wood's lamp  Intertrigo is a chronic recurrent rash that occurs in skin fold areas that may be associated with friction; heat; moisture; yeast; fungus; and bacteria.  It is exacerbated by increased movement / activity; sweating; and higher atmospheric temperature.  Treatment Plan Patient deferred treatment at this time.   FOLLICULITIS Exam: Perifollicular erythematous papule at abdomen  Treatment Plan: Apply Mupirocin twice daily.   Return in about 1 year (around 05/15/2024) for TBSE, HxDN, 2 weeks  Injection On Nurse Schedule, 4 - 6 months for Atopic Dermatitis Follow Up.  I, Lawson Radar, CMA, am acting as scribe for Darden Dates, MD.   Documentation: I have reviewed the above documentation for accuracy and completeness, and I agree with the above.  Darden Dates, MD

## 2023-05-19 ENCOUNTER — Encounter: Payer: Self-pay | Admitting: Family Medicine

## 2023-05-20 ENCOUNTER — Other Ambulatory Visit: Payer: Self-pay | Admitting: Family Medicine

## 2023-05-21 NOTE — Telephone Encounter (Signed)
Requested medication (s) are due for refill today -yes  Requested medication (s) are on the active medication list -yes  Future visit scheduled -yes  Last refill: 02/25/23 #30 2RF  Notes to clinic: non delegated Rx  Requested Prescriptions  Pending Prescriptions Disp Refills   cyclobenzaprine (FLEXERIL) 10 MG tablet [Pharmacy Med Name: Cyclobenzaprine HCl 10 MG Oral Tablet] 30 tablet 0    Sig: TAKE 1 TABLET BY MOUTH AT BEDTIME     Not Delegated - Analgesics:  Muscle Relaxants Failed - 05/20/2023  5:40 PM      Failed - This refill cannot be delegated      Passed - Valid encounter within last 6 months    Recent Outpatient Visits           2 weeks ago Acute non-recurrent frontal sinusitis   [place] [facility] Larae Grooms, NP   3 weeks ago Chronic gout of multiple sites, unspecified cause   [place] [facility] [place], Megan P, DO   3 months ago Chronic gout of multiple sites, unspecified cause   [place] [facility] [place], Megan P, DO   5 months ago COVID-19   [place] [facility] Gabriel Cirri, NP   6 months ago Need for influenza vaccination   [place] [facility] Dorcas Carrow, DO       Future Appointments             In 2 months Laural Benes, Oralia Rud, DO [place] [facility]   In 4 months Deirdre Evener, MD [place] [facility]   In 1 year Deirdre Evener, MD [place] [facility]               Requested Prescriptions  Pending Prescriptions Disp Refills   cyclobenzaprine (FLEXERIL) 10 MG tablet [Pharmacy Med Name: Cyclobenzaprine HCl 10 MG Oral Tablet] 30 tablet 0    Sig: TAKE 1 TABLET BY MOUTH AT BEDTIME     Not Delegated - Analgesics:  Muscle Relaxants Failed - 05/20/2023  5:40 PM      Failed - This refill cannot be delegated      Passed - Valid encounter within last 6 months    Recent Outpatient Visits           2  weeks ago Acute non-recurrent frontal sinusitis   [place] [facility] Larae Grooms, NP   3 weeks ago Chronic gout of multiple sites, unspecified cause   [place] [facility] [place], Megan P, DO   3 months ago Chronic gout of multiple sites, unspecified cause   [place] [facility] [place], Megan P, DO   5 months ago COVID-19   [place] [facility] Gabriel Cirri, NP   6 months ago Need for influenza vaccination   [place] [facility] Dorcas Carrow, DO       Future Appointments             In 2 months Laural Benes, Oralia Rud, DO  [facility]   In 4 months Deirdre Evener, MD [facility] [place] [facility]   In 1 year Deirdre Evener, MD [facility]

## 2023-05-21 NOTE — Telephone Encounter (Signed)
Requested medication (s) are due for refill today - yes  Requested medication (s) are on the active medication list -yes  Future visit scheduled -yes  Last refill: 02/25/23 #30 2RF  Notes to clinic: duplicate request, non delegated Rx  Requested Prescriptions  Pending Prescriptions Disp Refills   cyclobenzaprine (FLEXERIL) 10 MG tablet [Pharmacy Med Name: Cyclobenzaprine HCl 10 MG Oral Tablet] 30 tablet 0    Sig: TAKE 1 TABLET BY MOUTH AT BEDTIME     Not Delegated - Analgesics:  Muscle Relaxants Failed - 05/20/2023  5:43 PM      Failed - This refill cannot be delegated      Passed - Valid encounter within last 6 months    Recent Outpatient Visits           2 weeks ago Acute non-recurrent frontal sinusitis   [place] [facility] Larae Grooms, NP   3 weeks ago Chronic gout of multiple sites, unspecified cause   [place] [facility] [place], Megan P, DO   3 months ago Chronic gout of multiple sites, unspecified cause   [place] [facility] [place], Megan P, DO   5 months ago COVID-19   [place] [facility] Gabriel Cirri, NP   6 months ago Need for influenza vaccination   [place] [facility] Dorcas Carrow, DO       Future Appointments             In 2 months Laural Benes, Oralia Rud, DO [place]   In 4 months Deirdre Evener, MD [place] [facility]   In 1 year Deirdre Evener, MD [place] [facility]               Requested Prescriptions  Pending Prescriptions Disp Refills   cyclobenzaprine (FLEXERIL) 10 MG tablet [Pharmacy Med Name: Cyclobenzaprine HCl 10 MG Oral Tablet] 30 tablet 0    Sig: TAKE 1 TABLET BY MOUTH AT BEDTIME     Not Delegated - Analgesics:  Muscle Relaxants Failed - 05/20/2023  5:43 PM      Failed - This refill cannot be delegated      Passed - Valid encounter within last 6 months    Recent Outpatient  Visits           2 weeks ago Acute non-recurrent frontal sinusitis   [place] [facility] Larae Grooms, NP   3 weeks ago Chronic gout of multiple sites, unspecified cause   [place] [facility] [place], Megan P, DO   3 months ago Chronic gout of multiple sites, unspecified cause   [place] [facility] [place], Megan P, DO   5 months ago COVID-19   [place] [facility] Gabriel Cirri, NP   6 months ago Need for influenza vaccination   [place] [facility] Dorcas Carrow, DO       Future Appointments             In 2 months Laural Benes, Oralia Rud, DO  [facility]   In 4 months Deirdre Evener, MD [facility] [place] [facility]   In 1 year Deirdre Evener, MD [facility] [facility] [place] [facility]

## 2023-05-22 ENCOUNTER — Other Ambulatory Visit: Payer: Self-pay | Admitting: Dermatology

## 2023-05-22 ENCOUNTER — Encounter: Payer: Self-pay | Admitting: Dermatology

## 2023-05-22 ENCOUNTER — Other Ambulatory Visit: Payer: Self-pay | Admitting: Family Medicine

## 2023-05-22 ENCOUNTER — Encounter: Payer: Self-pay | Admitting: Family Medicine

## 2023-05-22 ENCOUNTER — Other Ambulatory Visit: Payer: Self-pay

## 2023-05-22 MED ORDER — HALOBETASOL PROPIONATE 0.05 % EX OINT: TOPICAL_OINTMENT | Freq: Two times a day (BID) | CUTANEOUS | 0 refills | Status: DC

## 2023-05-22 MED ORDER — ADBRY 150 MG/ML ~~LOC~~ SOSY: 300.0000 mg | PREFILLED_SYRINGE | SUBCUTANEOUS | 4 refills | Status: DC

## 2023-05-22 NOTE — Telephone Encounter (Signed)
That's fine.  Thank you.

## 2023-05-22 NOTE — Progress Notes (Signed)
Escribed to [facility] on 05/16/23 but they sent fax saying that they did not receive it. Escribing again

## 2023-05-30 ENCOUNTER — Other Ambulatory Visit: Payer: Self-pay

## 2023-05-30 ENCOUNTER — Ambulatory Visit (INDEPENDENT_AMBULATORY_CARE_PROVIDER_SITE_OTHER): Payer: Medicaid Other

## 2023-05-30 DIAGNOSIS — L209 Atopic dermatitis, unspecified: Secondary | ICD-10-CM | POA: Diagnosis not present

## 2023-05-30 MED ORDER — TRALOKINUMAB-LDRM 150 MG/ML ~~LOC~~ SOSY
300.0000 mg | PREFILLED_SYRINGE | Freq: Once | SUBCUTANEOUS | Status: AC
Start: 2023-05-30 — End: 2023-05-30
  Administered 2023-05-30: 300 mg via SUBCUTANEOUS

## 2023-05-30 MED ORDER — ADBRY 150 MG/ML ~~LOC~~ SOSY: 300.0000 mg | PREFILLED_SYRINGE | SUBCUTANEOUS | 4 refills | Status: DC

## 2023-05-30 NOTE — Progress Notes (Signed)
Patient here today for 2 week Adbry Injection for Severe Atopic Dermatitis.    Adbry 300mg /mL SQ Q2 wk  Adbry 300 mg/mL injected today to right upper arm. Patient tolerated well.    NDC: 54098-119-14 Lot: [id number] Exp: 04/2024   [name], RMA

## 2023-06-01 ENCOUNTER — Encounter: Payer: Self-pay | Admitting: Dermatology

## 2023-06-13 ENCOUNTER — Ambulatory Visit (INDEPENDENT_AMBULATORY_CARE_PROVIDER_SITE_OTHER): Payer: Medicaid Other

## 2023-06-13 DIAGNOSIS — L209 Atopic dermatitis, unspecified: Secondary | ICD-10-CM

## 2023-06-13 MED ORDER — TRALOKINUMAB-LDRM 150 MG/ML ~~LOC~~ SOSY
300.0000 mg | PREFILLED_SYRINGE | Freq: Once | SUBCUTANEOUS | Status: AC
Start: 2023-06-13 — End: 2023-06-13
  Administered 2023-06-13: 300 mg via SUBCUTANEOUS

## 2023-06-13 NOTE — Progress Notes (Signed)
Patient here today for 2 week Adbry Injection for Severe Atopic Dermatitis.    Adbry 300mg /mL SQ Q2 wk  Adbry 300 mg/mL injected today to left upper arm. Patient tolerated well.    NDC: 16109-604-54 Lot: [id number] Exp: 04/2024   [name], RMA

## 2023-06-15 ENCOUNTER — Encounter: Payer: Self-pay | Admitting: Dermatology

## 2023-06-23 ENCOUNTER — Encounter: Payer: Self-pay | Admitting: Family Medicine

## 2023-06-24 ENCOUNTER — Other Ambulatory Visit: Payer: Self-pay | Admitting: Family Medicine

## 2023-06-24 ENCOUNTER — Encounter: Payer: Self-pay | Admitting: Family Medicine

## 2023-06-24 MED ORDER — CYCLOBENZAPRINE HCL 10 MG PO TABS: 10.0000 mg | ORAL_TABLET | Freq: Every day | ORAL | 0 refills | Status: DC

## 2023-06-27 ENCOUNTER — Ambulatory Visit (INDEPENDENT_AMBULATORY_CARE_PROVIDER_SITE_OTHER): Payer: Medicaid Other

## 2023-06-27 DIAGNOSIS — L209 Atopic dermatitis, unspecified: Secondary | ICD-10-CM

## 2023-06-27 MED ORDER — TRALOKINUMAB-LDRM 150 MG/ML ~~LOC~~ SOSY
300.0000 mg | PREFILLED_SYRINGE | Freq: Once | SUBCUTANEOUS | Status: AC
Start: 2023-06-27 — End: 2023-06-27
  Administered 2023-06-27: 300 mg via SUBCUTANEOUS

## 2023-06-27 NOTE — Progress Notes (Signed)
Patient here today for 2 week Adbry Injection for Severe Atopic Dermatitis.    Adbry 300mg /mL SQ Q2 wk  Adbry 300 mg/mL injected today to right upper arm. Patient tolerated well.    NDC: 96045-409-81 Lot: [id number] Exp: 04/2024  Evorn Gong RMA

## 2023-07-01 ENCOUNTER — Ambulatory Visit (INDEPENDENT_AMBULATORY_CARE_PROVIDER_SITE_OTHER): Payer: MEDICAID | Admitting: Family Medicine

## 2023-07-01 ENCOUNTER — Encounter: Payer: Self-pay | Admitting: Family Medicine

## 2023-07-01 VITALS — BP 132/71 | HR 94 | Temp 98.4°F | Wt 196.2 lb

## 2023-07-01 DIAGNOSIS — M545 Low back pain, unspecified: Secondary | ICD-10-CM | POA: Diagnosis not present

## 2023-07-01 MED ORDER — KETOROLAC TROMETHAMINE 60 MG/2ML IM SOLN
60.0000 mg | Freq: Once | INTRAMUSCULAR | Status: AC
Start: 2023-07-01 — End: 2023-07-01
  Administered 2023-07-01: 60 mg via INTRAMUSCULAR

## 2023-07-01 NOTE — Progress Notes (Signed)
BP 132/71   Pulse 94   Temp 98.4 F (36.9 C) (Oral)   Wt 196 lb 3.2 oz (89 kg)   SpO2 97%   BMI 33.68 kg/m    Subjective:    Patient ID: Jesus Guerrero, male    DOB: 1971-01-12, 52 y.o.   MRN: [id number]  HPI: Jesus Guerrero is a 52 y.o. male  Chief Complaint  Patient presents with   Back Pain    Pt states has been experiencing pain for almost 2 weeks    BACK PAIN Duration: 2-3 weeks Mechanism of injury: unknown Location: bilateral and low back Onset: gradual Severity: 5/10 Quality: shooting Frequency: constant Radiation: none Aggravating factors: sitting on something hard Alleviating factors: naproxen dulls it Status: stable Treatments attempted: rest, ice, heat, APAP, ibuprofen, and aleve  Relief with NSAIDs?: mild Nighttime pain:  yes Paresthesias / decreased sensation:  no Bowel / bladder incontinence:  no Fevers:  no Dysuria / urinary frequency:  no  Relevant past medical, surgical, family and social history reviewed and updated as indicated. Interim medical history since our last visit reviewed. Allergies and medications reviewed and updated.  Review of Systems  Constitutional: Negative.   Respiratory: Negative.    Cardiovascular: Negative.   Gastrointestinal: Negative.   Musculoskeletal:  Positive for back pain and myalgias. Negative for arthralgias, gait problem, joint swelling, neck pain and neck stiffness.  Skin: Negative.   Neurological: Negative.   Psychiatric/Behavioral: Negative.      Per HPI unless specifically indicated above     Objective:    BP 132/71   Pulse 94   Temp 98.4 F (36.9 C) (Oral)   Wt 196 lb 3.2 oz (89 kg)   SpO2 97%   BMI 33.68 kg/m   Wt Readings from Last 3 Encounters:  07/01/23 196 lb 3.2 oz (89 kg)  05/06/23 199 lb (90.3 kg)  04/26/23 202 lb 4.8 oz (91.8 kg)    Physical Exam Vitals and nursing note reviewed.  Constitutional:      General: He is not in acute distress.    Appearance: Normal  appearance. He is not ill-appearing, toxic-appearing or diaphoretic.  HENT:     Head: Normocephalic and atraumatic.     Right Ear: External ear normal.     Left Ear: External ear normal.     Nose: Nose normal.     Mouth/Throat:     Mouth: Mucous membranes are moist.     Pharynx: Oropharynx is clear.  Eyes:     General: No scleral icterus.       Right eye: No discharge.        Left eye: No discharge.     Extraocular Movements: Extraocular movements intact.     Conjunctiva/sclera: Conjunctivae normal.     Pupils: Pupils are equal, round, and reactive to light.  Cardiovascular:     Rate and Rhythm: Normal rate and regular rhythm.     Pulses: Normal pulses.     Heart sounds: Normal heart sounds. No murmur heard.    No friction rub. No gallop.  Pulmonary:     Effort: Pulmonary effort is normal. No respiratory distress.     Breath sounds: Normal breath sounds. No stridor. No wheezing, rhonchi or rales.  Chest:     Chest wall: No tenderness.  Musculoskeletal:        General: No swelling, tenderness, deformity or signs of injury. Normal range of motion.     Cervical back: Normal range of motion  and neck supple.     Right lower leg: No edema.     Left lower leg: No edema.  Skin:    General: Skin is warm and dry.     Capillary Refill: Capillary refill takes less than 2 seconds.     Coloration: Skin is not jaundiced or pale.     Findings: No bruising, erythema, lesion or rash.  Neurological:     General: No focal deficit present.     Mental Status: He is alert and oriented to person, place, and time. Mental status is at baseline.  Psychiatric:        Mood and Affect: Mood normal.        Behavior: Behavior normal.        Thought Content: Thought content normal.        Judgment: Judgment normal.     Results for orders placed or performed in visit on 01/25/23  CBC with Differential/Platelet  Result Value Ref Range   WBC 8.8 3.4 - 10.8 x10E3/uL   RBC 4.71 4.14 - 5.80 x10E6/uL    Hemoglobin 14.5 13.0 - 17.7 g/dL   Hematocrit 40.9 81.1 - 51.0 %   MCV 90 79 - 97 fL   MCH 30.8 26.6 - 33.0 pg   MCHC 34.2 31.5 - 35.7 g/dL   RDW 91.4 78.2 - 95.6 %   Platelets 279 150 - 450 x10E3/uL   Neutrophils 71 Not Estab. %   Lymphs 22 Not Estab. %   Monocytes 5 Not Estab. %   Eos 1 Not Estab. %   Basos 1 Not Estab. %   Neutrophils Absolute 6.2 1.4 - 7.0 x10E3/uL   Lymphocytes Absolute 1.9 0.7 - 3.1 x10E3/uL   Monocytes Absolute 0.5 0.1 - 0.9 x10E3/uL   EOS (ABSOLUTE) 0.1 0.0 - 0.4 x10E3/uL   Basophils Absolute 0.1 0.0 - 0.2 x10E3/uL   Immature Granulocytes 0 Not Estab. %   Immature Grans (Abs) 0.0 0.0 - 0.1 x10E3/uL  Comprehensive metabolic panel  Result Value Ref Range   Glucose 124 (H) 70 - 99 mg/dL   BUN 23 6 - 24 mg/dL   Creatinine, Ser 2.13 0.76 - 1.27 mg/dL   eGFR 84 >08 MV/HQI/6.96   BUN/Creatinine Ratio 22 (H) 9 - 20   Sodium 139 134 - 144 mmol/L   Potassium 4.9 3.5 - 5.2 mmol/L   Chloride 102 96 - 106 mmol/L   CO2 21 20 - 29 mmol/L   Calcium 9.4 8.7 - 10.2 mg/dL   Total Protein 7.1 6.0 - 8.5 g/dL   Albumin 4.4 3.8 - 4.9 g/dL   Globulin, Total 2.7 1.5 - 4.5 g/dL   Albumin/Globulin Ratio 1.6 1.2 - 2.2   Bilirubin Total 0.4 0.0 - 1.2 mg/dL   Alkaline Phosphatase 117 44 - 121 IU/L   AST 21 0 - 40 IU/L   ALT 29 0 - 44 IU/L  Lipid Panel w/o Chol/HDL Ratio  Result Value Ref Range   Cholesterol, Total 173 100 - 199 mg/dL   Triglycerides 295 (H) 0 - 149 mg/dL   HDL 31 (L) >28 mg/dL   VLDL Cholesterol Cal 79 (H) 5 - 40 mg/dL   LDL Chol Calc (NIH) 63 0 - 99 mg/dL  Uric acid  Result Value Ref Range   Uric Acid 4.4 3.8 - 8.4 mg/dL   *Note: Due to a large number of results and/or encounters for the requested time period, some results have not been displayed. A complete set of results  can be found in Results Review.      Assessment & Plan:   Problem List Items Addressed This Visit   None Visit Diagnoses     Acute bilateral low back pain without sciatica     -  Primary   Will give toradol shot and get him into PT. Call with any concerns.   Relevant Medications   ketorolac (TORADOL) injection 60 mg   Other Relevant Orders   Ambulatory referral to Physical Therapy        Follow up plan: Return in about 4 weeks (around 07/29/2023).

## 2023-07-01 NOTE — Telephone Encounter (Signed)
Pt already had appointment scheduled for today on 07/01/2023.

## 2023-07-02 ENCOUNTER — Encounter: Payer: Self-pay | Admitting: Family Medicine

## 2023-07-02 ENCOUNTER — Other Ambulatory Visit: Payer: Self-pay

## 2023-07-02 MED ORDER — HALOBETASOL PROPIONATE 0.05 % EX OINT: TOPICAL_OINTMENT | Freq: Two times a day (BID) | CUTANEOUS | 0 refills | Status: DC

## 2023-07-02 NOTE — Progress Notes (Signed)
Refill request faxed from [facility]. Escripted  

## 2023-07-03 ENCOUNTER — Encounter: Payer: Self-pay | Admitting: Family Medicine

## 2023-07-04 ENCOUNTER — Encounter: Payer: Self-pay | Admitting: Family Medicine

## 2023-07-11 ENCOUNTER — Ambulatory Visit (INDEPENDENT_AMBULATORY_CARE_PROVIDER_SITE_OTHER): Payer: MEDICAID

## 2023-07-11 DIAGNOSIS — L209 Atopic dermatitis, unspecified: Secondary | ICD-10-CM

## 2023-07-11 MED ORDER — TRALOKINUMAB-LDRM 150 MG/ML ~~LOC~~ SOSY
300.0000 mg | PREFILLED_SYRINGE | Freq: Once | SUBCUTANEOUS | Status: AC
Start: 2023-07-11 — End: 2023-07-11
  Administered 2023-07-11: 300 mg via SUBCUTANEOUS

## 2023-07-11 NOTE — Progress Notes (Signed)
Patient here today for 2 week Adbry Injection for Severe Atopic Dermatitis.    Adbry 300mg /mL SQ Q2 wk  Adbry 300 mg/mL injected today to left upper arm. Patient tolerated well.    NDC: 16109-604-54 Lot: [id number] Exp: 07/2024   [name], RMA

## 2023-07-21 ENCOUNTER — Encounter: Payer: Self-pay | Admitting: Family Medicine

## 2023-07-22 ENCOUNTER — Ambulatory Visit: Payer: MEDICAID

## 2023-07-24 ENCOUNTER — Encounter: Payer: Self-pay | Admitting: Dermatology

## 2023-07-25 ENCOUNTER — Ambulatory Visit: Payer: MEDICAID

## 2023-07-28 ENCOUNTER — Encounter: Payer: Self-pay | Admitting: Family Medicine

## 2023-07-29 ENCOUNTER — Other Ambulatory Visit: Payer: Self-pay | Admitting: Family Medicine

## 2023-07-29 ENCOUNTER — Encounter: Payer: Self-pay | Admitting: Family Medicine

## 2023-07-29 ENCOUNTER — Encounter: Payer: Self-pay | Admitting: Dermatology

## 2023-07-29 ENCOUNTER — Ambulatory Visit: Payer: MEDICAID

## 2023-07-29 NOTE — Telephone Encounter (Signed)
Requested medication (s) are due for refill today - yes  Requested medication (s) are on the active medication list -yes  Future visit scheduled -yes  Last refill: 06/24/23 #30  Notes to clinic: non delegated Rx  Requested Prescriptions  Pending Prescriptions Disp Refills   cyclobenzaprine (FLEXERIL) 10 MG tablet [Pharmacy Med Name: Cyclobenzaprine HCl 10 MG Oral Tablet] 30 tablet 0    Sig: TAKE 1 TABLET BY MOUTH AT BEDTIME     Not Delegated - Analgesics:  Muscle Relaxants Failed - 07/29/2023  7:58 AM      Failed - This refill cannot be delegated      Passed - Valid encounter within last 6 months    Recent Outpatient Visits           4 weeks ago Acute bilateral low back pain without sciatica   [place] [facility] [place], Megan P, DO   2 months ago Acute non-recurrent frontal sinusitis   [place] [facility] [place], Clydie Braun, NP   3 months ago Chronic gout of multiple sites, unspecified cause   [place] [facility] [place], Megan P, DO   6 months ago Chronic gout of multiple sites, unspecified cause   [place] [facility] [place], Megan P, DO   7 months ago COVID-19   [place] [facility] Gabriel Cirri, NP       Future Appointments             In 3 days Dorcas Carrow, DO [facility]   In 2 months Deirdre Evener, MD [facility]   In 9 months Deirdre Evener, MD [place] [facility]               Requested Prescriptions  Pending Prescriptions Disp Refills   cyclobenzaprine (FLEXERIL) 10 MG tablet [Pharmacy Med Name: Cyclobenzaprine HCl 10 MG Oral Tablet] 30 tablet 0    Sig: TAKE 1 TABLET BY MOUTH AT BEDTIME     Not Delegated - Analgesics:  Muscle Relaxants Failed - 07/29/2023  7:58 AM      Failed - This refill cannot be delegated      Passed - Valid encounter within last 6 months    Recent Outpatient Visits            4 weeks ago Acute bilateral low back pain without sciatica   [place] [facility] [place], Megan P, DO   2 months ago Acute non-recurrent frontal sinusitis   [place] [facility] Larae Grooms, NP   3 months ago Chronic gout of multiple sites, unspecified cause   [place] [facility] [place], Megan P, DO   6 months ago Chronic gout of multiple sites, unspecified cause   [place] [facility] [place], Megan P, DO   7 months ago COVID-19   [place] [facility] Gabriel Cirri, NP       Future Appointments             In 3 days Dorcas Carrow, DO  [facility]   In 2 months Deirdre Evener, MD [facility]   In 9 months Deirdre Evener, MD [facility] [place] [facility]

## 2023-07-30 ENCOUNTER — Ambulatory Visit: Payer: MEDICAID

## 2023-07-31 ENCOUNTER — Ambulatory Visit: Payer: MEDICAID

## 2023-08-01 ENCOUNTER — Ambulatory Visit (INDEPENDENT_AMBULATORY_CARE_PROVIDER_SITE_OTHER): Payer: MEDICAID | Admitting: Family Medicine

## 2023-08-01 ENCOUNTER — Encounter: Payer: Self-pay | Admitting: Family Medicine

## 2023-08-01 VITALS — BP 129/71 | HR 94 | Temp 98.6°F | Wt 196.8 lb

## 2023-08-01 DIAGNOSIS — Z0000 Encounter for general adult medical examination without abnormal findings: Secondary | ICD-10-CM | POA: Diagnosis not present

## 2023-08-01 DIAGNOSIS — M545 Low back pain, unspecified: Secondary | ICD-10-CM

## 2023-08-01 LAB — URINALYSIS, ROUTINE W REFLEX MICROSCOPIC
Bilirubin, UA: NEGATIVE
Glucose, UA: NEGATIVE
Ketones, UA: NEGATIVE
Leukocytes,UA: NEGATIVE
Nitrite, UA: NEGATIVE
Protein,UA: NEGATIVE
RBC, UA: NEGATIVE
Specific Gravity, UA: 1.02 (ref 1.005–1.030)
Urobilinogen, Ur: 0.2 mg/dL (ref 0.2–1.0)
pH, UA: 6 (ref 5.0–7.5)

## 2023-08-01 LAB — MICROALBUMIN, URINE WAIVED
Creatinine, Urine Waived: 200 mg/dL (ref 10–300)
Microalb, Ur Waived: 30 mg/L — ABNORMAL HIGH (ref 0–19)
Microalb/Creat Ratio: <30 mg/g (ref <30)

## 2023-08-01 MED ORDER — MONTELUKAST SODIUM 10 MG PO TABS: 10.0000 mg | ORAL_TABLET | Freq: Every day | ORAL | 2 refills | Status: DC
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
## 2023-08-02 ENCOUNTER — Encounter: Payer: Self-pay | Admitting: Family Medicine

## 2023-08-03 ENCOUNTER — Encounter: Payer: Self-pay | Admitting: Dermatology

## 2023-08-07 ENCOUNTER — Ambulatory Visit: Payer: MEDICAID | Attending: Family Medicine

## 2023-08-07 ENCOUNTER — Encounter: Payer: Self-pay | Admitting: Dermatology

## 2023-08-07 DIAGNOSIS — M5459 Other low back pain: Secondary | ICD-10-CM | POA: Diagnosis present

## 2023-08-07 DIAGNOSIS — M545 Low back pain, unspecified: Secondary | ICD-10-CM | POA: Diagnosis not present
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
## 2023-08-08 ENCOUNTER — Ambulatory Visit: Payer: MEDICAID

## 2023-08-10 ENCOUNTER — Encounter: Payer: Self-pay | Admitting: Dermatology

## 2023-08-10 DIAGNOSIS — L2089 Other atopic dermatitis: Secondary | ICD-10-CM

## 2023-08-11 ENCOUNTER — Encounter (INDEPENDENT_AMBULATORY_CARE_PROVIDER_SITE_OTHER): Payer: Self-pay

## 2023-08-13 ENCOUNTER — Ambulatory Visit: Payer: MEDICAID

## 2023-08-13 ENCOUNTER — Encounter: Payer: Self-pay | Admitting: Family Medicine

## 2023-08-13 DIAGNOSIS — M5459 Other low back pain: Secondary | ICD-10-CM

## 2023-08-13 MED ORDER — CLOBETASOL PROPIONATE 0.05 % EX OINT
TOPICAL_OINTMENT | CUTANEOUS | 0 refills | Status: DC
Start: 2023-08-13 — End: 2024-07-21
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
## 2023-08-15 ENCOUNTER — Ambulatory Visit: Payer: MEDICAID | Admitting: Family Medicine

## 2023-08-15 ENCOUNTER — Ambulatory Visit: Payer: MEDICAID

## 2023-08-15 DIAGNOSIS — M5459 Other low back pain: Secondary | ICD-10-CM
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
## 2023-08-19 ENCOUNTER — Encounter: Payer: Self-pay | Admitting: Dermatology

## 2023-08-20 ENCOUNTER — Telehealth: Payer: Self-pay

## 2023-08-20 ENCOUNTER — Ambulatory Visit: Payer: MEDICAID

## 2023-08-20 DIAGNOSIS — L209 Atopic dermatitis, unspecified: Secondary | ICD-10-CM

## 2023-08-20 DIAGNOSIS — M5459 Other low back pain: Secondary | ICD-10-CM

## 2023-08-20 NOTE — Telephone Encounter (Signed)
Dr. Onnie Boer patient transferring to you. Scheduled for office visit in October.  Okay to enter orders for Adb[facility] every 2 weeks?
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
## 2023-08-21 MED ORDER — TRALOKINUMAB-LDRM 150 MG/ML ~~LOC~~ SOSY
300.0000 mg | PREFILLED_SYRINGE | SUBCUTANEOUS | Status: DC
Start: 2023-08-21 — End: 2023-09-23
  Administered 2023-08-22: 300 mg via SUBCUTANEOUS

## 2023-08-22 ENCOUNTER — Encounter: Payer: Self-pay | Admitting: Family Medicine

## 2023-08-22 ENCOUNTER — Ambulatory Visit (INDEPENDENT_AMBULATORY_CARE_PROVIDER_SITE_OTHER): Payer: MEDICAID | Admitting: Dermatology

## 2023-08-22 ENCOUNTER — Encounter: Payer: Self-pay | Admitting: Dermatology

## 2023-08-22 DIAGNOSIS — L739 Follicular disorder, unspecified: Secondary | ICD-10-CM | POA: Diagnosis not present

## 2023-08-22 DIAGNOSIS — I788 Other diseases of capillaries: Secondary | ICD-10-CM

## 2023-08-22 DIAGNOSIS — Z79899 Other long term (current) drug therapy: Secondary | ICD-10-CM | POA: Diagnosis not present

## 2023-08-22 DIAGNOSIS — L209 Atopic dermatitis, unspecified: Secondary | ICD-10-CM

## 2023-08-22 DIAGNOSIS — L2089 Other atopic dermatitis: Secondary | ICD-10-CM

## 2023-08-22 MED ORDER — HALOBETASOL PROPIONATE 0.05 % EX OINT: TOPICAL_OINTMENT | Freq: Two times a day (BID) | CUTANEOUS | 1 refills | Status: DC

## 2023-08-22 NOTE — Patient Instructions (Addendum)
Start Halobetasol ointment twice daily to back and foot until no longer itching.   If not improved in 1 week change to anti itch cream like Sarna or CeraVe Itch Relief.     Start Hibiclens wash daily in shower on back. Avoid getting in eyes. If not improving with Hibiclens can try a benzoyl peroxide wash like CeraVe acne wash 4% Benzoyl peroxide.  A sample of Hibiclens has been provided as well as a sample of CeraVe Itch Relief lotion.             Continue Adbry every 2 weeks as directed.   Tralokinumab Carvel Getting) is a treatment given by injection for adults with moderate-to-severe atopic dermatitis. Goal is control of skin condition, not cure. It is given as 4 injections at the first dose followed by 2 injections ever 2 weeks thereafter.  Potential side effects include allergic reaction, injection site reactions and conjunctivitis (inflammation of the eyes).  The use of Adbry requires long term medication management, including periodic office visits.  Due to recent changes in healthcare laws, you may see results of your pathology and/or laboratory studies on MyChart before the doctors have had a chance to review them. We understand that in some cases there may be results that are confusing or concerning to you. Please understand that not all results are received at the same time and often the doctors may need to interpret multiple results in order to provide you with the best plan of care or course of treatment. Therefore, we ask that you please give [place] 2 business days to thoroughly review all your results before contacting the office for clarification. Should we see a critical lab result, you will be contacted sooner.   If You Need Anything After Your Visit  If you have any questions or concerns for your doctor, please call our main line at [phone] and press option 4 to reach your doctor's medical assistant. If no one answers, please leave a voicemail as directed and we will return  your call as soon as possible. Messages left after 4 pm will be answered the following business day.   You may also send [place] a message via MyChart. We typically respond to MyChart messages within 1-2 business days.  For prescription refills, please ask your pharmacy to contact our office. Our fax number is [phone].  If you have an urgent issue when the clinic is closed that cannot wait until the next business day, you can page your doctor at the number below.    Please note that while we do our best to be available for urgent issues outside of office hours, we are not available 24/7.   If you have an urgent issue and are unable to reach [place], you may choose to seek medical care at your doctor's office, retail clinic, urgent care center, or emergency room.  If you have a medical emergency, please immediately call 911 or go to the emergency department.  Pager Numbers  - Dr. Gwen Pounds: [phone]  - Dr. Roseanne Reno: [phone]  - Dr. Katrinka Blazing: [phone]   In the event of inclement weather, please call our main line at [phone] for an update on the status of any delays or closures.  Dermatology Medication Tips: Please keep the boxes that topical medications come in in order to help keep track of the instructions about where and how to use these. Pharmacies typically print the medication instructions only on the boxes and not directly on the medication tubes.   If  your medication is too expensive, please contact our office at [phone] option 4 or send [place] a message through MyChart.   We are unable to tell what your co-pay for medications will be in advance as this is different depending on your insurance coverage. However, we may be able to find a substitute medication at lower cost or fill out paperwork to get insurance to cover a needed medication.   If a prior authorization is required to get your medication covered by your insurance company, please allow [place] 1-2 business days to  complete this process.  Drug prices often vary depending on where the prescription is filled and some pharmacies may offer cheaper prices.  The website www.goodrx.com contains coupons for medications through different pharmacies. The prices here do not account for what the cost may be with help from insurance (it may be cheaper with your insurance), but the website can give you the price if you did not use any insurance.  - You can print the associated coupon and take it with your prescription to the pharmacy.  - You may also stop by our office during regular business hours and pick up a GoodRx coupon card.  - If you need your prescription sent electronically to a different pharmacy, notify our office through [facility] or by phone at [phone] option 4.     Si Usted Necesita Algo Despus de Su Visita  Tambin puede enviarnos un mensaje a travs de Clinical cytogeneticist. Por lo general respondemos a los mensajes de MyChart en el transcurso de 1 a 2 das hbiles.  Para renovar recetas, por favor pida a su farmacia que se ponga en contacto con nuestra oficina. Annie Sable de fax es [place] [phone].  Si tiene un asunto urgente cuando la clnica est cerrada y que no puede esperar hasta el siguiente da hbil, puede llamar/localizar a su doctor(a) al nmero que aparece a continuacin.   Por favor, tenga en cuenta que aunque hacemos todo lo posible para estar disponibles para asuntos urgentes fuera del horario de [place], no estamos disponibles las 24 horas del da, los 7 [address]  [address] de la [place].   Si tiene un problema urgente y no puede comunicarse con nosotros, puede optar por buscar atencin mdica  en el consultorio de su doctor(a), en una clnica privada, en un centro de atencin urgente o en una sala de emergencias.  Si tiene Engineer, drilling, por favor llame inmediatamente al 911 o vaya a la sala de emergencias.  Nmeros de bper  - Dr. Gwen Pounds: [phone]  - Dra. Roseanne Reno:  [phone]  - Dr. Katrinka Blazing: [phone]   En caso de inclemencias del tiempo, por favor llame a Lacy Duverney principal al [phone] para una actualizacin sobre el [place] de cualquier retraso o cierre.  Consejos para la medicacin en dermatologa: Por favor, guarde las cajas en las que vienen los medicamentos de uso tpico para ayudarle a seguir las instrucciones sobre dnde y cmo usarlos. Las farmacias generalmente imprimen las instrucciones del medicamento slo en las cajas y no directamente en los tubos del [place].   Si su medicamento es muy caro, por favor, pngase en contacto con Rolm Gala llamando al [phone] y presione la opcin 4 o envenos un mensaje a travs de Clinical cytogeneticist.   No podemos decirle cul ser su copago por los medicamentos por adelantado ya que esto es diferente dependiendo de la cobertura de su seguro. Sin embargo, es posible que podamos encontrar un medicamento sustituto a Therapist, occupational  formulario para que el seguro cubra el medicamento que se considera necesario.   Si se requiere una autorizacin previa para que su compaa de seguros [place] su medicamento, por favor permtanos de 1 a 2 das hbiles para completar [address]  Los precios de los medicamentos varan con frecuencia dependiendo del Environmental consultant de dnde se surte la receta y alguna farmacias pueden ofrecer precios ms baratos.  El sitio web www.goodrx.com tiene cupones para medicamentos de Health and safety inspector. Los precios aqu no tienen en cuenta lo que podra costar con la ayuda del seguro (puede ser ms barato con su seguro), pero el sitio web puede darle el precio si no utiliz Tourist information centre manager.  - Puede imprimir el cupn correspondiente y llevarlo con su receta a la farmacia.  - Tambin puede pasar por nuestra oficina durante el horario de atencin regular y Education officer, museum una tarjeta de cupones de GoodRx.  - Si necesita que su receta se enve electrnicamente a una farmacia diferente, informe  a nuestra oficina a travs de MyChart de [place] o por telfono llamando al [phone] y presione la opcin 4.

## 2023-08-22 NOTE — Progress Notes (Signed)
Follow Up Visit   Subjective  Jesus Guerrero is a 52 y.o. male who presents for the following: Rash on back and right foot. Itching. Has hx of atopic dermatitis. Currently on Adbry for AD. Thinks this could be a different type of rash. Dur: ~2 weeks.   Atopic dermatitis follow up. On Adbry 300 mg every 2 weeks. Controlling well. Denies adverse reactions, denies injection site reactions   The following portions of the chart were reviewed this encounter and updated as appropriate: medications, allergies, medical history  Review of Systems:  No other skin or systemic complaints except as noted in HPI or Assessment and Plan.  Objective  Well appearing patient in no apparent distress; mood and affect are within normal limits.  A focused examination was performed of the following areas: Face, back, right foot  Relevant exam findings are noted in the Assessment and Plan.    Assessment & Plan     Capillaritis, chronic, flaring, not at patient goal Exam: pink-rust colored macules at right foot around lateral malleolus and first four toes  Treatment Plan: Halobetasol ointment BID until no longer itching. IF not improved in 1 week change to anti itch cream like Sarna or CeraVe Itch Relief.  CeraVe sample given.   FOLLICULITIS, chronic, flaring, not at patient goal Exam: follicular erythematous papules and pustules at back  Folliculitis occurs due to inflammation of the superficial hair follicle (pore), resulting in acne-like lesions (pus bumps). It can be infectious (bacterial, fungal) or noninfectious (shaving, tight clothing, heat/sweat, medications).  Folliculitis can be acute or chronic and recommended treatment depends on the underlying cause of folliculitis.  Treatment Plan: Start Hibiclens wash daily in shower. Avoid getting in eyes. If not improving with Hibiclens can try a benzoyl peroxide wash like CeraVe acne wash 4% Benzoyl peroxide.  CeraVe Cream samples given to apply  to face  ATOPIC DERMATITIS Exam: Scaly pink papules coalescing to plaques 5% BSA  Chronic and persistent condition with duration or expected duration over one year. Condition is improving with treatment but not currently at goal.   Atopic dermatitis (eczema) is a chronic, relapsing, pruritic condition that can significantly affect quality of life. It is often associated with allergic rhinitis and/or asthma and can require treatment with topical medications, phototherapy, or in severe cases biologic injectable medication (Dupixent; Adbry) or Oral JAK inhibitors.  Treatment Plan: Continue Adbry 300 mg every 2 weeks as directed.  Adbry 300 mg injected into right upper arm. Patient tolerated well.   NDC: 91478-295-62 Lot: [id number] Exp: 07/2024  Long term medication management.  Patient is using long term (months to years) prescription medication  to control their dermatologic condition.  These medications require periodic monitoring to evaluate for efficacy and side effects and may require periodic laboratory monitoring.  Tralokinumab Carvel Getting) is a treatment given by injection for adults with moderate-to-severe atopic dermatitis. Goal is control of skin condition, not cure. It is given as 4 injections at the first dose followed by 2 injections ever 2 weeks thereafter.  Potential side effects include allergic reaction, injection site reactions and conjunctivitis (inflammation of the eyes).  The use of Adbry requires long term medication management, including periodic office visits.   Recommend gentle skin care.   Return in about 2 weeks (around 09/05/2023) for Adbry Injection On Nurse Schedule.  I, Lawson Radar, CMA, am acting as scribe for Elie Goody, MD.   Documentation: I have reviewed the above documentation for accuracy and completeness, and I agree with  the above.  Elie Goody, MD

## 2023-08-25 ENCOUNTER — Encounter: Payer: Self-pay | Admitting: Dermatology

## 2023-08-25 ENCOUNTER — Encounter: Payer: Self-pay | Admitting: Family Medicine

## 2023-08-26 NOTE — Telephone Encounter (Signed)
No further action needed.

## 2023-08-27 ENCOUNTER — Ambulatory Visit: Payer: MEDICAID

## 2023-08-27 DIAGNOSIS — M5459 Other low back pain: Secondary | ICD-10-CM
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
## 2023-08-28 ENCOUNTER — Encounter: Payer: Self-pay | Admitting: Dermatology

## 2023-08-29 ENCOUNTER — Encounter: Payer: Self-pay | Admitting: Family Medicine

## 2023-08-29 ENCOUNTER — Other Ambulatory Visit: Payer: Self-pay | Admitting: Family Medicine

## 2023-08-30 ENCOUNTER — Telehealth: Payer: Self-pay | Admitting: Family Medicine

## 2023-08-30 ENCOUNTER — Encounter: Payer: Self-pay | Admitting: Family Medicine

## 2023-08-30 MED ORDER — CYCLOBENZAPRINE HCL 10 MG PO TABS: 10.0000 mg | ORAL_TABLET | Freq: Every day | ORAL | 0 refills | Status: DC

## 2023-08-30 NOTE — Telephone Encounter (Signed)
Requested medication (s) are due for refill today: routing for approval  Requested medication (s) are on the active medication list: yes  Last refill:  07/29/23  Future visit scheduled: yes  Notes to clinic:  Unable to refill per protocol, cannot delegate.      Requested Prescriptions  Pending Prescriptions Disp Refills   cyclobenzaprine (FLEXERIL) 10 MG tablet [Pharmacy Med Name: Cyclobenzaprine HCl 10 MG Oral Tablet] 30 tablet 0    Sig: TAKE 1 TABLET BY MOUTH AT BEDTIME     Not Delegated - Analgesics:  Muscle Relaxants Failed - 08/29/2023  9:00 AM      Failed - This refill cannot be delegated      Passed - Valid encounter within last 6 months    Recent Outpatient Visits           4 weeks ago Routine general medical examination at a health care facility   [facility], [place] P, DO   2 months ago Acute bilateral low back pain without sciatica   [place] [facility] [place], [place] P, DO   3 months ago Acute non-recurrent frontal sinusitis   [facility] Larae Grooms, NP   4 months ago Chronic gout of multiple sites, unspecified cause   [facility] [place], Megan P, DO   7 months ago Chronic gout of multiple sites, unspecified cause   [place] [facility] Dorcas Carrow, DO       Future Appointments             In 1 month Deirdre Evener, MD [facility]   In 2 months Dorcas Carrow, DO [facility]   In 8 months Deirdre Evener, MD [facility]

## 2023-08-30 NOTE — Telephone Encounter (Signed)
Request for this medication has been routed to provider and pending her review

## 2023-08-30 NOTE — Telephone Encounter (Signed)
Medication Refill - Medication: cyclobenzaprine (FLEXERIL) 10 MG tablet [[id number]]   Pt reports that he is out of medication.  Has the patient contacted their pharmacy? Yes.   (Agent: If no, request that the patient contact the pharmacy for the refill. If patient does not wish to contact the pharmacy document the reason why and proceed with request.) (Agent: If yes, when and what did the pharmacy advise?)  Preferred Pharmacy (with phone number or street name):  [facility] [address] [place]), [place] - [address] Phone: [phone]  Fax: [phone]     Has the patient been seen for an appointment in the last year OR does the patient have an upcoming appointment? Yes.    Agent: Please be advised that RX refills may take up to 3 business days. We ask that you follow-up with your pharmacy.

## 2023-08-31 ENCOUNTER — Encounter: Payer: Self-pay | Admitting: Dermatology

## 2023-09-04 ENCOUNTER — Encounter: Payer: Self-pay | Admitting: Dermatology

## 2023-09-04 ENCOUNTER — Ambulatory Visit: Payer: MEDICAID | Attending: Family Medicine

## 2023-09-04 DIAGNOSIS — M5459 Other low back pain: Secondary | ICD-10-CM | POA: Diagnosis present
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
## 2023-09-06 ENCOUNTER — Ambulatory Visit: Payer: MEDICAID

## 2023-09-06 DIAGNOSIS — M5459 Other low back pain: Secondary | ICD-10-CM
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
## 2023-09-09 ENCOUNTER — Ambulatory Visit: Payer: MEDICAID

## 2023-09-09 DIAGNOSIS — L209 Atopic dermatitis, unspecified: Secondary | ICD-10-CM

## 2023-09-09 MED ORDER — TRALOKINUMAB-LDRM 150 MG/ML ~~LOC~~ SOSY
300.0000 mg | PREFILLED_SYRINGE | Freq: Once | SUBCUTANEOUS | Status: AC
Start: 2023-09-09 — End: 2023-09-09
  Administered 2023-09-09: 300 mg via SUBCUTANEOUS

## 2023-09-09 NOTE — Progress Notes (Signed)
Patient here today for 2 week Adbry Injection for Severe Atopic Dermatitis.    Adbry 300mg /mL SQ Q2 wk  Adbry 300 mg/mL injected today to left upper arm. Patient tolerated well.    NDC: 02725-366-44 Lot: [id number] Exp: 07/2024   Evorn Gong, RMA

## 2023-09-10 ENCOUNTER — Ambulatory Visit: Payer: MEDICAID

## 2023-09-10 DIAGNOSIS — M5459 Other low back pain: Secondary | ICD-10-CM | POA: Diagnosis not present
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
## 2023-09-11 ENCOUNTER — Other Ambulatory Visit: Payer: Self-pay | Admitting: Dermatology

## 2023-09-11 DIAGNOSIS — L209 Atopic dermatitis, unspecified: Secondary | ICD-10-CM

## 2023-09-12 ENCOUNTER — Ambulatory Visit: Payer: MEDICAID

## 2023-09-12 DIAGNOSIS — M5459 Other low back pain: Secondary | ICD-10-CM | POA: Diagnosis not present
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
## 2023-09-17 ENCOUNTER — Ambulatory Visit: Payer: MEDICAID

## 2023-09-17 DIAGNOSIS — M5459 Other low back pain: Secondary | ICD-10-CM | POA: Diagnosis not present
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
## 2023-09-18 ENCOUNTER — Encounter: Payer: Self-pay | Admitting: Family Medicine

## 2023-09-19 ENCOUNTER — Ambulatory Visit: Payer: MEDICAID

## 2023-09-19 DIAGNOSIS — M5459 Other low back pain: Secondary | ICD-10-CM | POA: Diagnosis not present
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
## 2023-09-23 ENCOUNTER — Ambulatory Visit: Payer: MEDICAID

## 2023-09-23 ENCOUNTER — Ambulatory Visit (INDEPENDENT_AMBULATORY_CARE_PROVIDER_SITE_OTHER): Payer: MEDICAID | Admitting: Dermatology

## 2023-09-23 ENCOUNTER — Encounter: Payer: Self-pay | Admitting: Dermatology

## 2023-09-23 VITALS — BP 132/71 | HR 88

## 2023-09-23 DIAGNOSIS — L739 Follicular disorder, unspecified: Secondary | ICD-10-CM

## 2023-09-23 DIAGNOSIS — L299 Pruritus, unspecified: Secondary | ICD-10-CM | POA: Diagnosis not present

## 2023-09-23 DIAGNOSIS — S0001XA Abrasion of scalp, initial encounter: Secondary | ICD-10-CM | POA: Diagnosis not present

## 2023-09-23 DIAGNOSIS — L209 Atopic dermatitis, unspecified: Secondary | ICD-10-CM | POA: Diagnosis not present

## 2023-09-23 DIAGNOSIS — T148XXA Other injury of unspecified body region, initial encounter: Secondary | ICD-10-CM

## 2023-09-23 DIAGNOSIS — Z79899 Other long term (current) drug therapy: Secondary | ICD-10-CM

## 2023-09-23 DIAGNOSIS — S50812A Abrasion of left forearm, initial encounter: Secondary | ICD-10-CM

## 2023-09-23 MED ORDER — TRALOKINUMAB-LDRM 150 MG/ML ~~LOC~~ SOSY
300.0000 mg | PREFILLED_SYRINGE | Freq: Once | SUBCUTANEOUS | Status: AC
Start: 2023-09-23 — End: 2023-09-23
  Administered 2023-09-23: 300 mg via SUBCUTANEOUS

## 2023-09-23 MED ORDER — LIDOCAINE 5 % EX OINT: TOPICAL_OINTMENT | CUTANEOUS | 0 refills | Status: DC

## 2023-09-23 NOTE — Patient Instructions (Addendum)
Stop Halobetasol ointment on the back as using that for folliculitis may cause condition to worsen. Recommend using over the counter Hibiclens cleanser daily in the shower for bumps on the back. For itching areas recommend CeraVe anti-itch cream daily.   Continue Halobetasol ointment to the scalp twice daily as needed, and start Lidocaine ointment to itchy areas on the scalp and back twice daily as needed.  Due to recent changes in healthcare laws, you may see results of your pathology and/or laboratory studies on MyChart before the doctors have had a chance to review them. We understand that in some cases there may be results that are confusing or concerning to you. Please understand that not all results are received at the same time and often the doctors may need to interpret multiple results in order to provide you with the best plan of care or course of treatment. Therefore, we ask that you please give [place] 2 business days to thoroughly review all your results before contacting the office for clarification. Should we see a critical lab result, you will be contacted sooner.   If You Need Anything After Your Visit  If you have any questions or concerns for your doctor, please call our main line at [phone] and press option 4 to reach your doctor's medical assistant. If no one answers, please leave a voicemail as directed and we will return your call as soon as possible. Messages left after 4 pm will be answered the following business day.   You may also send [place] a message via MyChart. We typically respond to MyChart messages within 1-2 business days.  For prescription refills, please ask your pharmacy to contact our office. Our fax number is [phone].  If you have an urgent issue when the clinic is closed that cannot wait until the next business day, you can page your doctor at the number below.    Please note that while we do our best to be available for urgent issues outside of office  hours, we are not available 24/7.   If you have an urgent issue and are unable to reach [place], you may choose to seek medical care at your doctor's office, retail clinic, urgent care center, or emergency room.  If you have a medical emergency, please immediately call 911 or go to the emergency department.  Pager Numbers  - Dr. Gwen Pounds: [phone]  - Dr. Roseanne Reno: [phone]  - Dr. Katrinka Blazing: [phone]   In the event of inclement weather, please call our main line at [phone] for an update on the status of any delays or closures.  Dermatology Medication Tips: Please keep the boxes that topical medications come in in order to help keep track of the instructions about where and how to use these. Pharmacies typically print the medication instructions only on the boxes and not directly on the medication tubes.   If your medication is too expensive, please contact our office at [phone] option 4 or send [place] a message through MyChart.   We are unable to tell what your co-pay for medications will be in advance as this is different depending on your insurance coverage. However, we may be able to find a substitute medication at lower cost or fill out paperwork to get insurance to cover a needed medication.   If a prior authorization is required to get your medication covered by your insurance company, please allow [place] 1-2 business days to complete this process.  Drug prices often vary depending on where the prescription  is filled and some pharmacies may offer cheaper prices.  The website www.goodrx.com contains coupons for medications through different pharmacies. The prices here do not account for what the cost may be with help from insurance (it may be cheaper with your insurance), but the website can give you the price if you did not use any insurance.  - You can print the associated coupon and take it with your prescription to the pharmacy.  - You may also stop by our office during  regular business hours and pick up a GoodRx coupon card.  - If you need your prescription sent electronically to a different pharmacy, notify our office through [facility] or by phone at [phone] option 4.     Si Usted Necesita Algo Despus de Su Visita  Tambin puede enviarnos un mensaje a travs de Clinical cytogeneticist. Por lo general respondemos a los mensajes de MyChart en el transcurso de 1 a 2 das hbiles.  Para renovar recetas, por favor pida a su farmacia que se ponga en contacto con nuestra oficina. Annie Sable de fax es [place] [phone].  Si tiene un asunto urgente cuando la clnica est cerrada y que no puede esperar hasta el siguiente da hbil, puede llamar/localizar a su doctor(a) al nmero que aparece a continuacin.   Por favor, tenga en cuenta que aunque hacemos todo lo posible para estar disponibles para asuntos urgentes fuera del horario de [place], no estamos disponibles las 24 horas del da, los 7 [address]  [address] de la [place].   Si tiene un problema urgente y no puede comunicarse con nosotros, puede optar por buscar atencin mdica  en el consultorio de su doctor(a), en una clnica privada, en un centro de atencin urgente o en una sala de emergencias.  Si tiene Engineer, drilling, por favor llame inmediatamente al 911 o vaya a la sala de emergencias.  Nmeros de bper  - Dr. Gwen Pounds: [phone]  - Dra. Roseanne Reno: [phone]  - Dr. Katrinka Blazing: [phone]   En caso de inclemencias del tiempo, por favor llame a Lacy Duverney principal al [phone] para una actualizacin sobre el [place] de cualquier retraso o cierre.  Consejos para la medicacin en dermatologa: Por favor, guarde las cajas en las que vienen los medicamentos de uso tpico para ayudarle a seguir las instrucciones sobre dnde y cmo usarlos. Las farmacias generalmente imprimen las instrucciones del medicamento slo en las cajas y no directamente en los tubos del [place].   Si su medicamento es muy  caro, por favor, pngase en contacto con Rolm Gala llamando al [phone] y presione la opcin 4 o envenos un mensaje a travs de Clinical cytogeneticist.   No podemos decirle cul ser su copago por los medicamentos por adelantado ya que esto es diferente dependiendo de la cobertura de su seguro. Sin embargo, es posible que podamos encontrar un medicamento sustituto a Audiological scientist un formulario para que el seguro cubra el medicamento que se considera necesario.   Si se requiere una autorizacin previa para que su compaa de seguros [place] su medicamento, por favor permtanos de 1 a 2 das hbiles para completar [address]  Los precios de los medicamentos varan con frecuencia dependiendo del Environmental consultant de dnde se surte la receta y alguna farmacias pueden ofrecer precios ms baratos.  El sitio web www.goodrx.com tiene cupones para medicamentos de Health and safety inspector. Los precios aqu no tienen en cuenta lo que podra costar con la ayuda del seguro (puede ser ms barato con su seguro), pero el sitio web  puede darle el precio si no utiliz [employer].  - Puede imprimir el cupn correspondiente y llevarlo con su receta a la farmacia.  - Tambin puede pasar por nuestra oficina durante el horario de atencin regular y Education officer, museum una tarjeta de cupones de GoodRx.  - Si necesita que su receta se enve electrnicamente a una farmacia diferente, informe a nuestra oficina a travs de MyChart de [place] o por telfono llamando al [phone] y presione la opcin 4.

## 2023-09-23 NOTE — Progress Notes (Signed)
Follow-Up Visit   Subjective  Jesus Guerrero is a 52 y.o. male who presents for the following: Atopic dermatitis, patient states that condition is well controlled on Adbry 300mg /64mL SQ Q2W. Pt c/o itching on the scalp, currently he uses Ketoconazole 2% shampoo and Halobetasol ointment, but itching remains a problem. Pt states that folliculitis on the back continues to be an issue as well as itching on the back. Currently he is using Halobetasol ointment and Hibiclens wash.    The patient has spots, moles and lesions to be evaluated, some may be new or changing and the patient may have concern these could be cancer.   The following portions of the chart were reviewed this encounter and updated as appropriate: medications, allergies, medical history  Review of Systems:  No other skin or systemic complaints except as noted in HPI or Assessment and Plan.  Objective  Well appearing patient in no apparent distress; mood and affect are within normal limits.   A focused examination was performed of the following areas: the face, scalp, back, ears, and arms   Relevant exam findings are noted in the Assessment and Plan.    Assessment & Plan      Atopic dermatitis, unspecified type  Related Medications ELIDEL 1 % cream Apply topically 2 (two) times daily.  mupirocin ointment (BACTROBAN) 2 % APPLY TOPICALLY TO AFFECTED AREA ONCE DAILY  tralokinumab SOSY 300 mg   FOLLICULITIS, chronic, flaring, not at patient goal Exam: Scattered few inflamed follicular papules and few excoriations on the back.   Folliculitis occurs due to inflammation of the superficial hair follicle (pore), resulting in acne-like lesions (pus bumps). It can be infectious (bacterial, fungal) or noninfectious (shaving, tight clothing, heat/sweat, medications).  Folliculitis can be acute or chronic and recommended treatment depends on the underlying cause of folliculitis.   Treatment Plan: Continue Hibiclens  wash daily in shower. Avoid getting in eyes. If not improving with Hibiclens can try a benzoyl peroxide wash like CeraVe acne wash 4% Benzoyl peroxide.  Discontinue Halobetasol ointment on the back as this may cause condition to worsen.    CeraVe anti itch Cream samples given to apply to face  ATOPIC DERMATITIS Exam: not performed   Chronic and persistent condition with duration or expected duration over one year. Condition is improving with treatment but not currently at goal.   Atopic dermatitis (eczema) is a chronic, relapsing, pruritic condition that can significantly affect quality of life. It is often associated with allergic rhinitis and/or asthma and can require treatment with topical medications, phototherapy, or in severe cases biologic injectable medication (Dupixent; Adbry) or Oral JAK inhibitors.   Treatment Plan: Continue Adbry 300 mg every 2 weeks as directed.   Adbry 300 mg injected into right upper arm. Patient tolerated well. AL, CMA   NDC: [id number] Lot: [id number] Exp: 07/2024   Long term medication management.  Patient is using long term (months to years) prescription medication  to control their dermatologic condition.  These medications require periodic monitoring to evaluate for efficacy and side effects and may require periodic laboratory monitoring.   Tralokinumab Carvel Getting) is a treatment given by injection for adults with moderate-to-severe atopic dermatitis. Goal is control of skin condition, not cure. It is given as 4 injections at the first dose followed by 2 injections ever 2 weeks thereafter.   Potential side effects include allergic reaction, injection site reactions and conjunctivitis (inflammation of the eyes).  The use of Adbry requires long term medication management, including  periodic office visits.   Recommend gentle skin care.   Pruritus of the scalp with scattered excoriations from scratching, otherwise clear.  Exam: vertex scalp with terminal  hairs of varying lengths and healed scarred excoriations  Plan: - favour neurogenic excorations - Continue Halobetasol ointment to aa's BID PRN.  - Start Lidocaine ointment to aa's BID PRN for itch.   Excoriation of the R post auricular, L forearm -  Exam: crusted plaque on left forearm Excoriated ulceration on R postauricular  Plan: - favour neurogenic excoriation and hair pulling - L forearm Healing well, R post auricular is a fresh wound - vaseline daily until healed   Return in about 2 weeks (around 10/07/2023) for Adbry injections with nurse; appt as scheduled with Dr. Gwen Pounds.  Maylene Roes, CMA, am acting as scribe for Elie Goody, MD .  Documentation: I have reviewed the above documentation for accuracy and completeness, and I agree with the above.  Elie Goody, MD

## 2023-09-24 ENCOUNTER — Ambulatory Visit: Payer: MEDICAID

## 2023-09-24 DIAGNOSIS — M5459 Other low back pain: Secondary | ICD-10-CM
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
## 2023-09-26 ENCOUNTER — Other Ambulatory Visit: Payer: Self-pay | Admitting: Nurse Practitioner

## 2023-09-26 ENCOUNTER — Ambulatory Visit: Payer: MEDICAID

## 2023-09-26 DIAGNOSIS — M5459 Other low back pain: Secondary | ICD-10-CM
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
## 2023-09-27 ENCOUNTER — Other Ambulatory Visit: Payer: Self-pay | Admitting: Nurse Practitioner

## 2023-09-27 ENCOUNTER — Encounter: Payer: Self-pay | Admitting: Family Medicine

## 2023-09-27 ENCOUNTER — Other Ambulatory Visit: Payer: Self-pay | Admitting: Family Medicine

## 2023-09-27 NOTE — Telephone Encounter (Signed)
Requested Prescriptions  Pending Prescriptions Disp Refills   naproxen (NAPROSYN) 500 MG tablet [Pharmacy Med Name: Naproxen 500 MG Oral Tablet] 180 tablet 0    Sig: TAKE 1 TABLET BY MOUTH TWICE DAILY WITH A MEAL     Analgesics:  NSAIDS Failed - 09/27/2023 11:35 AM      Failed - Manual Review: Labs are only required if the patient has taken medication for more than 8 weeks.      Passed - Cr in normal range and within 360 days    Creatinine  Date Value Ref Range Status  11/30/2014 1.24 0.60 - 1.30 mg/dL Final   Creatinine, Ser  Date Value Ref Range Status  08/01/2023 1.13 0.76 - 1.27 mg/dL Final         Passed - HGB in normal range and within 360 days    Hemoglobin  Date Value Ref Range Status  08/01/2023 14.2 13.0 - 17.7 g/dL Final         Passed - PLT in normal range and within 360 days    Platelets  Date Value Ref Range Status  08/01/2023 263 150 - 450 x10E3/uL Final         Passed - HCT in normal range and within 360 days    Hematocrit  Date Value Ref Range Status  08/01/2023 41.4 37.5 - 51.0 % Final         Passed - eGFR is 30 or above and within 360 days    EGFR (African American)  Date Value Ref Range Status  11/30/2014 >60 >78mL/min Final  12/23/2013 >60  Final   GFR calc Af Amer  Date Value Ref Range Status  09/28/2020 84 >59 mL/min/1.73 Final    Comment:    **[facility] currently reports eGFR in compliance with the current**   recommendations of the [employer]. [facility] will   update reporting as new guidelines are published from the NKF-ASN   Task force.    EGFR (Non-African Amer.)  Date Value Ref Range Status  11/30/2014 >60 >37mL/min Final    Comment:    eGFR values <52mL/min/1.73 m2 may be an indication of chronic kidney disease (CKD). Calculated eGFR, using the MRDR Study equation, is useful in  patients with stable renal function. The eGFR calculation will not be reliable in acutely ill patients when serum creatinine is changing  rapidly. It is not useful in patients on dialysis. The eGFR calculation may not be applicable to patients at the low and high extremes of body sizes, pregnant women, and vegetarians.   12/23/2013 >60  Final    Comment:    eGFR values <36mL/min/1.73 m2 may be an indication of chronic kidney disease (CKD). Calculated eGFR is useful in patients with stable renal function. The eGFR calculation will not be reliable in acutely ill patients when serum creatinine is changing rapidly. It is not useful in  patients on dialysis. The eGFR calculation may not be applicable to patients at the low and high extremes of body sizes, pregnant women, and vegetarians.    GFR calc non Af Amer  Date Value Ref Range Status  09/28/2020 73 >59 mL/min/1.73 Final   eGFR  Date Value Ref Range Status  08/01/2023 78 >59 mL/min/1.73 Final         Passed - Patient is not pregnant      Passed - Valid encounter within last 12 months    Recent Outpatient Visits           1 month  ago Routine general medical examination at a health care facility   [facility] [place], [place] P, DO   2 months ago Acute bilateral low back pain without sciatica   [facility] [place], [place] P, DO   4 months ago Acute non-recurrent frontal sinusitis   [facility] Larae Grooms, NP   5 months ago Chronic gout of multiple sites, unspecified cause   [facility] [place], [place] P, DO   8 months ago Chronic gout of multiple sites, unspecified cause   [facility] Dorcas Carrow, DO       Future Appointments             In 2 weeks Deirdre Evener, MD [facility]   In 1 month Laural Benes, Oralia Rud, DO  [facility]   In 7 months Deirdre Evener, MD [facility]

## 2023-09-27 NOTE — Telephone Encounter (Signed)
Requested medications are due for refill today.  yes  Requested medications are on the active medications list.  yes  Last refill. 08/30/2023 #30 0 rf  Future visit scheduled.   yes  Notes to clinic.  Refill not delegated.    Requested Prescriptions  Pending Prescriptions Disp Refills   cyclobenzaprine (FLEXERIL) 10 MG tablet [Pharmacy Med Name: Cyclobenzaprine HCl 10 MG Oral Tablet] 30 tablet 0    Sig: TAKE 1 TABLET BY MOUTH AT BEDTIME     Not Delegated - Analgesics:  Muscle Relaxants Failed - 09/26/2023  9:40 PM      Failed - This refill cannot be delegated      Passed - Valid encounter within last 6 months    Recent Outpatient Visits           1 month ago Routine general medical examination at a health care facility   [facility], [place] P, DO   2 months ago Acute bilateral low back pain without sciatica   [place] [facility], Megan P, DO   4 months ago Acute non-recurrent frontal sinusitis   [place] [facility] Larae Grooms, NP   5 months ago Chronic gout of multiple sites, unspecified cause   [place] [facility] Day, Megan P, DO   8 months ago Chronic gout of multiple sites, unspecified cause   [place] [facility] Dorcas Carrow, DO       Future Appointments             In 2 weeks Deirdre Evener, MD [facility]   In 1 month Laural Benes, Oralia Rud, DO [place] [facility]   In 7 months Deirdre Evener, MD [facility]

## 2023-10-01 ENCOUNTER — Other Ambulatory Visit: Payer: Self-pay | Admitting: Dermatology

## 2023-10-01 ENCOUNTER — Ambulatory Visit: Payer: MEDICAID | Attending: Family Medicine

## 2023-10-01 DIAGNOSIS — L219 Seborrheic dermatitis, unspecified: Secondary | ICD-10-CM

## 2023-10-01 DIAGNOSIS — M5459 Other low back pain: Secondary | ICD-10-CM | POA: Diagnosis present
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
## 2023-10-02 ENCOUNTER — Encounter: Payer: Self-pay | Admitting: Family Medicine

## 2023-10-03 ENCOUNTER — Ambulatory Visit: Payer: MEDICAID

## 2023-10-03 DIAGNOSIS — M5459 Other low back pain: Secondary | ICD-10-CM | POA: Diagnosis not present
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
## 2023-10-07 ENCOUNTER — Ambulatory Visit: Payer: MEDICAID

## 2023-10-08 ENCOUNTER — Telehealth: Payer: Self-pay

## 2023-10-08 ENCOUNTER — Ambulatory Visit (INDEPENDENT_AMBULATORY_CARE_PROVIDER_SITE_OTHER): Payer: MEDICAID

## 2023-10-08 DIAGNOSIS — L209 Atopic dermatitis, unspecified: Secondary | ICD-10-CM

## 2023-10-08 MED ORDER — TRALOKINUMAB-LDRM 150 MG/ML ~~LOC~~ SOSY
300.0000 mg | PREFILLED_SYRINGE | Freq: Once | SUBCUTANEOUS | Status: AC
Start: 2023-10-08 — End: 2023-10-08
  Administered 2023-10-08: 300 mg via SUBCUTANEOUS

## 2023-10-08 NOTE — Progress Notes (Signed)
Patient here today for 2 week Adbry Injection for Severe Atopic Dermatitis.    Adbry 300mg /mL SQ Q2 wk  Adbry 300 mg/mL injected today to left upper arm. Patient tolerated well.    NDC: 25956-387-56 Lot: [id number] Exp: 07/2024  [name], RMA

## 2023-10-08 NOTE — Telephone Encounter (Signed)
Since you last seen patient. Are you okay for me to enter one more CAM order for Adbry injection?  He is still scheduled to see Dr. Gwen Pounds next week. aw

## 2023-10-09 ENCOUNTER — Ambulatory Visit: Payer: Medicaid Other | Admitting: Dermatology

## 2023-10-09 ENCOUNTER — Ambulatory Visit: Payer: MEDICAID

## 2023-10-13 ENCOUNTER — Encounter: Payer: Self-pay | Admitting: Dermatology

## 2023-10-14 ENCOUNTER — Ambulatory Visit: Payer: Medicaid Other | Admitting: Dermatology

## 2023-10-17 ENCOUNTER — Ambulatory Visit (INDEPENDENT_AMBULATORY_CARE_PROVIDER_SITE_OTHER): Payer: MEDICAID | Admitting: Dermatology

## 2023-10-17 DIAGNOSIS — L2081 Atopic neurodermatitis: Secondary | ICD-10-CM

## 2023-10-17 DIAGNOSIS — L209 Atopic dermatitis, unspecified: Secondary | ICD-10-CM

## 2023-10-17 DIAGNOSIS — Z79899 Other long term (current) drug therapy: Secondary | ICD-10-CM | POA: Diagnosis not present

## 2023-10-17 DIAGNOSIS — L281 Prurigo nodularis: Secondary | ICD-10-CM | POA: Diagnosis not present

## 2023-10-17 DIAGNOSIS — Z7189 Other specified counseling: Secondary | ICD-10-CM

## 2023-10-17 MED ORDER — TRALOKINUMAB-LDRM 150 MG/ML ~~LOC~~ SOSY
300.0000 mg | PREFILLED_SYRINGE | SUBCUTANEOUS | Status: AC
Start: 2023-10-22 — End: 2024-05-04
  Administered 2023-10-22 – 2024-01-16 (×6): 300 mg via SUBCUTANEOUS

## 2023-10-17 NOTE — Progress Notes (Signed)
   Follow-Up Visit   Subjective  Jesus Guerrero is a 52 y.o. male who presents for the following: Atopic Dermatitis Currently on Adbry injections every 2 weeks.  Patient has a few areas on scalp and back he has been scratching.    The following portions of the chart were reviewed this encounter and updated as appropriate: medications, allergies, medical history  Review of Systems:  No other skin or systemic complaints except as noted in HPI or Assessment and Plan.  Objective  Well appearing patient in no apparent distress; mood and affect are within normal limits.  Areas Examined: Arms, back, face, scalp, hands  Relevant physical exam findings are noted in the Assessment and Plan.    Assessment & Plan   Atopic dermatitis, unspecified type  Related Medications ELIDEL 1 % cream Apply topically 2 (two) times daily.  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 Condition is bothersome/symptomatic for patient. Currently flared.   Atopic dermatitis - Severe, on Dupixent (biologic medication).  Atopic dermatitis (eczema) is a chronic, relapsing, pruritic condition that can significantly affect quality of life. It is often associated with allergic rhinitis and/or asthma and can require treatment with topical medications, phototherapy, or in severe cases biologic medications, which require long term medication management.    Treatment Plan:  Continue Adbry 300 mg every 2 weeks as directed.   Will conder Nemluvia  in future.  Information given at exam  Will discuss at next follow up in May with patient.    Potential side effects include allergic reaction, herpes infections, injection site reactions and conjunctivitis (inflammation of the eyes).  The use of Dupixent requires long term medication management, including periodic office visits.    Long term medication management.  Patient is using long term (months to years) prescription medication  to control their dermatologic condition.  These medications require periodic monitoring to evaluate for efficacy and side effects and may require periodic laboratory monitoring.   Recommend gentle skin care.   Return for 7 month follow up atopic derm .  IAsher Muir, CMA, am acting as scribe for Armida Sans, MD.   Documentation: I have reviewed the above documentation for accuracy and completeness, and I agree with the above.  Armida Sans, MD

## 2023-10-17 NOTE — Patient Instructions (Addendum)

## 2023-10-19 ENCOUNTER — Encounter: Payer: Self-pay | Admitting: Dermatology

## 2023-10-21 ENCOUNTER — Other Ambulatory Visit: Payer: Self-pay | Admitting: Dermatology

## 2023-10-21 ENCOUNTER — Encounter: Payer: Self-pay | Admitting: Dermatology

## 2023-10-22 ENCOUNTER — Ambulatory Visit: Payer: MEDICAID

## 2023-10-22 DIAGNOSIS — L209 Atopic dermatitis, unspecified: Secondary | ICD-10-CM | POA: Diagnosis not present

## 2023-10-22 NOTE — Progress Notes (Signed)
Patient here today for 2 week Adbry Injection for Severe Atopic Dermatitis.    Adbry 300mg /mL SQ Q2 wk  Adbry 300 mg/mL injected today to right upper arm. Patient tolerated well.    NDC: 16109-604-54 Lot: [id number] Exp: 07/2024  [name], RMA

## 2023-10-23 ENCOUNTER — Other Ambulatory Visit: Payer: Self-pay

## 2023-10-23 DIAGNOSIS — L209 Atopic dermatitis, unspecified: Secondary | ICD-10-CM

## 2023-10-23 MED ORDER — ELIDEL 1 % EX CREA: TOPICAL_CREAM | CUTANEOUS | 4 refills | Status: DC

## 2023-10-23 NOTE — Progress Notes (Signed)
Elidel refill

## 2023-10-27 ENCOUNTER — Encounter: Payer: Self-pay | Admitting: Family Medicine

## 2023-10-28 MED ORDER — PIMECROLIMUS 1 % EX CREA: TOPICAL_CREAM | Freq: Two times a day (BID) | CUTANEOUS | 0 refills | Status: DC

## 2023-10-28 MED ORDER — ELIDEL 1 % EX CREA: TOPICAL_CREAM | Freq: Two times a day (BID) | CUTANEOUS | 0 refills | Status: DC

## 2023-10-28 NOTE — Addendum Note (Signed)
Addended by: Cari Caraway A on: 10/28/2023 10:05 AM   Modules accepted: Orders

## 2023-10-31 ENCOUNTER — Encounter: Payer: Self-pay | Admitting: Family Medicine

## 2023-11-01 ENCOUNTER — Encounter: Payer: Self-pay | Admitting: Family Medicine

## 2023-11-01 ENCOUNTER — Ambulatory Visit: Payer: MEDICAID | Admitting: Family Medicine

## 2023-11-01 VITALS — BP 114/67 | HR 93 | Ht 64.0 in | Wt 198.6 lb

## 2023-11-01 DIAGNOSIS — K219 Gastro-esophageal reflux disease without esophagitis: Secondary | ICD-10-CM | POA: Diagnosis not present

## 2023-11-01 DIAGNOSIS — Z23 Encounter for immunization: Secondary | ICD-10-CM

## 2023-11-01 MED ORDER — OMEPRAZOLE 20 MG PO CPDR: 20.0000 mg | DELAYED_RELEASE_CAPSULE | Freq: Every day | ORAL | 3 refills | Status: DC

## 2023-11-01 MED ORDER — CYCLOBENZAPRINE HCL 10 MG PO TABS: 10.0000 mg | ORAL_TABLET | Freq: Every day | ORAL | 1 refills | Status: DC

## 2023-11-01 MED ORDER — LISINOPRIL 10 MG PO TABS: 10.0000 mg | ORAL_TABLET | Freq: Every day | ORAL | 1 refills | Status: DC

## 2023-11-01 MED ORDER — NAPROXEN 500 MG PO TABS: ORAL_TABLET | ORAL | 1 refills | Status: DC

## 2023-11-01 MED ORDER — HYDROXYZINE HCL 10 MG PO TABS: ORAL_TABLET | ORAL | 1 refills | Status: DC

## 2023-11-01 MED ORDER — ATORVASTATIN CALCIUM 20 MG PO TABS: 20.0000 mg | ORAL_TABLET | Freq: Every day | ORAL | 1 refills | Status: DC

## 2023-11-01 MED ORDER — ALLOPURINOL 300 MG PO TABS: 300.0000 mg | ORAL_TABLET | Freq: Every day | ORAL | 1 refills | Status: DC

## 2023-11-01 NOTE — Assessment & Plan Note (Signed)
Will start him on omeprazole and recheck in in 3 month. Call with any concerns. Continue to monitor.

## 2023-11-01 NOTE — Progress Notes (Signed)
BP 114/67   Pulse 93   Ht 5\' 4"  (1.626 m)   Wt 198 lb 9.6 oz (90.1 kg)   SpO2 96%   BMI 34.09 kg/m    Subjective:    Patient ID: Jesus Guerrero, male    DOB: 1971/08/10, 52 y.o.   MRN: [id number]  HPI: Jesus Guerrero is a 52 y.o. male  Chief Complaint  Patient presents with   Back Pain   Gastroesophageal Reflux    Patient says he is having issues with Acid Reflux, mainly after he eats. Patient says he has been taking some pills from someone.    GERD GERD control status: uncontrolled Satisfied with current treatment? no Heartburn frequency: almost every day Medication side effects: no  Medication compliance: taking something from a friend, unsure what it is Dysphagia: no Odynophagia:  no Hematemesis: no Blood in stool: no EGD: no   Relevant past medical, surgical, family and social history reviewed and updated as indicated. Interim medical history since our last visit reviewed. Allergies and medications reviewed and updated.  Review of Systems  Constitutional: Negative.   Respiratory: Negative.    Cardiovascular: Negative.   Musculoskeletal: Negative.   Psychiatric/Behavioral: Negative.      Per HPI unless specifically indicated above     Objective:    BP 114/67   Pulse 93   Ht 5\' 4"  (1.626 m)   Wt 198 lb 9.6 oz (90.1 kg)   SpO2 96%   BMI 34.09 kg/m   Wt Readings from Last 3 Encounters:  11/01/23 198 lb 9.6 oz (90.1 kg)  08/01/23 196 lb 12.8 oz (89.3 kg)  07/01/23 196 lb 3.2 oz (89 kg)    Physical Exam Vitals and nursing note reviewed.  Constitutional:      General: He is not in acute distress.    Appearance: Normal appearance. He is not ill-appearing, toxic-appearing or diaphoretic.  HENT:     Head: Normocephalic and atraumatic.     Right Ear: External ear normal.     Left Ear: External ear normal.     Nose: Nose normal.     Mouth/Throat:     Mouth: Mucous membranes are moist.     Pharynx: Oropharynx is clear.  Eyes:     General: No  scleral icterus.       Right eye: No discharge.        Left eye: No discharge.     Extraocular Movements: Extraocular movements intact.     Conjunctiva/sclera: Conjunctivae normal.     Pupils: Pupils are equal, round, and reactive to light.  Cardiovascular:     Rate and Rhythm: Normal rate and regular rhythm.     Pulses: Normal pulses.     Heart sounds: Normal heart sounds. No murmur heard.    No friction rub. No gallop.  Pulmonary:     Effort: Pulmonary effort is normal. No respiratory distress.     Breath sounds: Normal breath sounds. No stridor. No wheezing, rhonchi or rales.  Chest:     Chest wall: No tenderness.  Musculoskeletal:        General: Normal range of motion.     Cervical back: Normal range of motion and neck supple.  Skin:    General: Skin is warm and dry.     Capillary Refill: Capillary refill takes less than 2 seconds.     Coloration: Skin is not jaundiced or pale.     Findings: No bruising, erythema, lesion or rash.  Neurological:  General: No focal deficit present.     Mental Status: He is alert and oriented to person, place, and time. Mental status is at baseline.  Psychiatric:        Mood and Affect: Mood normal.        Behavior: Behavior normal.        Thought Content: Thought content normal.        Judgment: Judgment normal.     Results for orders placed or performed in visit on 08/01/23  Comprehensive metabolic panel  Result Value Ref Range   Glucose 101 (H) 70 - 99 mg/dL   BUN 22 6 - 24 mg/dL   Creatinine, Ser 0.98 0.76 - 1.27 mg/dL   eGFR 78 >11 BJ/YNW/2.95   BUN/Creatinine Ratio 19 9 - 20   Sodium 139 134 - 144 mmol/L   Potassium 4.9 3.5 - 5.2 mmol/L   Chloride 102 96 - 106 mmol/L   CO2 21 20 - 29 mmol/L   Calcium 9.5 8.7 - 10.2 mg/dL   Total Protein 7.0 6.0 - 8.5 g/dL   Albumin 4.5 3.8 - 4.9 g/dL   Globulin, Total 2.5 1.5 - 4.5 g/dL   Bilirubin Total 1.0 0.0 - 1.2 mg/dL   Alkaline Phosphatase 101 44 - 121 IU/L   AST 19 0 - 40 IU/L    ALT 19 0 - 44 IU/L  CBC with Differential/Platelet  Result Value Ref Range   WBC 9.0 3.4 - 10.8 x10E3/uL   RBC 4.58 4.14 - 5.80 x10E6/uL   Hemoglobin 14.2 13.0 - 17.7 g/dL   Hematocrit 62.1 30.8 - 51.0 %   MCV 90 79 - 97 fL   MCH 31.0 26.6 - 33.0 pg   MCHC 34.3 31.5 - 35.7 g/dL   RDW 65.7 84.6 - 96.2 %   Platelets 263 150 - 450 x10E3/uL   Neutrophils 70 Not Estab. %   Lymphs 21 Not Estab. %   Monocytes 6 Not Estab. %   Eos 1 Not Estab. %   Basos 1 Not Estab. %   Neutrophils Absolute 6.2 1.4 - 7.0 x10E3/uL   Lymphocytes Absolute 1.9 0.7 - 3.1 x10E3/uL   Monocytes Absolute 0.6 0.1 - 0.9 x10E3/uL   EOS (ABSOLUTE) 0.1 0.0 - 0.4 x10E3/uL   Basophils Absolute 0.1 0.0 - 0.2 x10E3/uL   Immature Granulocytes 1 Not Estab. %   Immature Grans (Abs) 0.1 0.0 - 0.1 x10E3/uL  Lipid Panel w/o Chol/HDL Ratio  Result Value Ref Range   Cholesterol, Total 165 100 - 199 mg/dL   Triglycerides 952 (H) 0 - 149 mg/dL   HDL 40 >84 mg/dL   VLDL Cholesterol Cal 41 (H) 5 - 40 mg/dL   LDL Chol Calc (NIH) 84 0 - 99 mg/dL  PSA  Result Value Ref Range   Prostate Specific Ag, Serum 0.5 0.0 - 4.0 ng/mL  TSH  Result Value Ref Range   TSH 3.100 0.450 - 4.500 uIU/mL  Urinalysis, Routine w reflex microscopic  Result Value Ref Range   Specific Gravity, UA 1.020 1.005 - 1.030   pH, UA 6.0 5.0 - 7.5   Color, UA Yellow Yellow   Appearance Ur Clear Clear   Leukocytes,UA Negative Negative   Protein,UA Negative Negative/Trace   Glucose, UA Negative Negative   Ketones, UA Negative Negative   RBC, UA Negative Negative   Bilirubin, UA Negative Negative   Urobilinogen, Ur 0.2 0.2 - 1.0 mg/dL   Nitrite, UA Negative Negative   Microscopic Examination Comment  Microalbumin, Urine Waived  Result Value Ref Range   Microalb, Ur Waived 30 (H) 0 - 19 mg/L   Creatinine, Urine Waived 200 10 - 300 mg/dL   Microalb/Creat Ratio <30 <30 mg/g  Uric acid  Result Value Ref Range   Uric Acid 5.1 3.8 - 8.4 mg/dL   *Note:  Due to a large number of results and/or encounters for the requested time period, some results have not been displayed. A complete set of results can be found in Results Review.      Assessment & Plan:   Problem List Items Addressed This Visit       Digestive   GERD (gastroesophageal reflux disease) - Primary    Will start him on omeprazole and recheck in in 3 month. Call with any concerns. Continue to monitor.       Relevant Medications   omeprazole (PRILOSEC) 20 MG capsule   Other Visit Diagnoses     Needs flu shot       Flu shot given today.   Relevant Orders   Flu vaccine trivalent PF, 6mos and older(Flulaval,Afluria,Fluarix,Fluzone) (Completed)        Follow up plan: Return in about 3 months (around 02/01/2024).

## 2023-11-02 ENCOUNTER — Encounter: Payer: Self-pay | Admitting: Dermatology

## 2023-11-04 ENCOUNTER — Encounter: Payer: Self-pay | Admitting: Dermatology

## 2023-11-04 ENCOUNTER — Encounter: Payer: Self-pay | Admitting: Family Medicine

## 2023-11-04 NOTE — Telephone Encounter (Signed)
Mychart appt scheduled. Patient wanted appt on Friday 11-08-23

## 2023-11-05 ENCOUNTER — Encounter: Payer: Self-pay | Admitting: Family Medicine

## 2023-11-05 ENCOUNTER — Ambulatory Visit: Payer: MEDICAID

## 2023-11-05 ENCOUNTER — Telehealth (INDEPENDENT_AMBULATORY_CARE_PROVIDER_SITE_OTHER): Payer: MEDICAID | Admitting: Family Medicine

## 2023-11-05 ENCOUNTER — Other Ambulatory Visit: Payer: Self-pay

## 2023-11-05 VITALS — Temp 97.3°F

## 2023-11-05 DIAGNOSIS — J069 Acute upper respiratory infection, unspecified: Secondary | ICD-10-CM

## 2023-11-05 LAB — VERITOR FLU A/B WAIVED
Influenza A: NEGATIVE
Influenza B: NEGATIVE

## 2023-11-05 MED ORDER — ADBRY 150 MG/ML ~~LOC~~ SOSY: 300.0000 mg | PREFILLED_SYRINGE | SUBCUTANEOUS | 4 refills | Status: DC

## 2023-11-05 MED ORDER — PREDNISONE 50 MG PO TABS: 50.0000 mg | ORAL_TABLET | Freq: Every day | ORAL | 0 refills | Status: DC

## 2023-11-05 MED ORDER — BENZONATATE 200 MG PO CAPS: 200.0000 mg | ORAL_CAPSULE | Freq: Two times a day (BID) | ORAL | 0 refills | Status: DC | PRN

## 2023-11-05 NOTE — Progress Notes (Signed)
Temp (!) 97.3 F (36.3 C)    Subjective:    Patient ID: Jesus Guerrero, male    DOB: 11-13-1971, 52 y.o.   MRN: [id number]  HPI: Jesus Guerrero is a 52 y.o. male  Chief Complaint  Patient presents with   Cough    Patient says he started having symptoms of cough, congestion and sore throat. Patient says he is starting to lose his voice. Patient says he has tried over the counter Severe Cold and Cough. Patient denies having any fever. Patient declines any at home covid testing.    Sore Throat   Chest Congestion   Chills   UPPER RESPIRATORY TRACT INFECTION Duration: 1 day Worst symptom: hoarse, cough Fever: no Cough: yes Shortness of breath: no Wheezing: no Chest pain: no Chest tightness: no Chest congestion: yes Nasal congestion: yes Runny nose: yes Post nasal drip: yes Sneezing: yes Sore throat: yes Swollen glands: no Sinus pressure: no Headache: yes Face pain: yes Toothache: no Ear pain: no  Ear pressure: no  Eyes red/itching:yes Eye drainage/crusting: no  Vomiting: yes Rash: no Fatigue: yes Sick contacts: yes Strep contacts: no  Context: worse Recurrent sinusitis: no Relief with OTC cold/cough medications: no  Treatments attempted: none   Relevant past medical, surgical, family and social history reviewed and updated as indicated. Interim medical history since our last visit reviewed. Allergies and medications reviewed and updated.  Review of Systems  Constitutional:  Positive for chills and fatigue. Negative for activity change, appetite change, diaphoresis, fever and unexpected weight change.  HENT:  Positive for congestion, postnasal drip, sinus pressure and sore throat. Negative for dental problem, drooling, ear discharge, ear pain, facial swelling, hearing loss, mouth sores, nosebleeds, rhinorrhea, sinus pain, sneezing, tinnitus, trouble swallowing and voice change.   Eyes: Negative.   Respiratory:  Positive for cough. Negative for apnea,  choking, chest tightness, shortness of breath, wheezing and stridor.   Cardiovascular: Negative.   Gastrointestinal: Negative.   Psychiatric/Behavioral: Negative.      Per HPI unless specifically indicated above     Objective:    Temp (!) 97.3 F (36.3 C)   Wt Readings from Last 3 Encounters:  11/01/23 198 lb 9.6 oz (90.1 kg)  08/01/23 196 lb 12.8 oz (89.3 kg)  07/01/23 196 lb 3.2 oz (89 kg)    Physical Exam Vitals and nursing note reviewed.  Constitutional:      General: He is not in acute distress.    Appearance: Normal appearance. He is not ill-appearing, toxic-appearing or diaphoretic.  HENT:     Head: Normocephalic and atraumatic.     Right Ear: External ear normal.     Left Ear: External ear normal.     Nose: Nose normal.     Mouth/Throat:     Mouth: Mucous membranes are moist.     Pharynx: Oropharynx is clear.  Eyes:     General: No scleral icterus.       Right eye: No discharge.        Left eye: No discharge.     Conjunctiva/sclera: Conjunctivae normal.     Pupils: Pupils are equal, round, and reactive to light.  Pulmonary:     Effort: Pulmonary effort is normal. No respiratory distress.     Comments: Speaking in full sentences Musculoskeletal:        General: Normal range of motion.     Cervical back: Normal range of motion.  Skin:    Coloration: Skin is not jaundiced or  pale.     Findings: No bruising, erythema, lesion or rash.  Neurological:     Mental Status: He is alert and oriented to person, place, and time. Mental status is at baseline.  Psychiatric:        Mood and Affect: Mood normal.        Behavior: Behavior normal.        Thought Content: Thought content normal.        Judgment: Judgment normal.     Results for orders placed or performed in visit on 08/01/23  Comprehensive metabolic panel  Result Value Ref Range   Glucose 101 (H) 70 - 99 mg/dL   BUN 22 6 - 24 mg/dL   Creatinine, Ser 5.40 0.76 - 1.27 mg/dL   eGFR 78 >98 JX/BJY/7.82    BUN/Creatinine Ratio 19 9 - 20   Sodium 139 134 - 144 mmol/L   Potassium 4.9 3.5 - 5.2 mmol/L   Chloride 102 96 - 106 mmol/L   CO2 21 20 - 29 mmol/L   Calcium 9.5 8.7 - 10.2 mg/dL   Total Protein 7.0 6.0 - 8.5 g/dL   Albumin 4.5 3.8 - 4.9 g/dL   Globulin, Total 2.5 1.5 - 4.5 g/dL   Bilirubin Total 1.0 0.0 - 1.2 mg/dL   Alkaline Phosphatase 101 44 - 121 IU/L   AST 19 0 - 40 IU/L   ALT 19 0 - 44 IU/L  CBC with Differential/Platelet  Result Value Ref Range   WBC 9.0 3.4 - 10.8 x10E3/uL   RBC 4.58 4.14 - 5.80 x10E6/uL   Hemoglobin 14.2 13.0 - 17.7 g/dL   Hematocrit 95.6 21.3 - 51.0 %   MCV 90 79 - 97 fL   MCH 31.0 26.6 - 33.0 pg   MCHC 34.3 31.5 - 35.7 g/dL   RDW 08.6 57.8 - 46.9 %   Platelets 263 150 - 450 x10E3/uL   Neutrophils 70 Not Estab. %   Lymphs 21 Not Estab. %   Monocytes 6 Not Estab. %   Eos 1 Not Estab. %   Basos 1 Not Estab. %   Neutrophils Absolute 6.2 1.4 - 7.0 x10E3/uL   Lymphocytes Absolute 1.9 0.7 - 3.1 x10E3/uL   Monocytes Absolute 0.6 0.1 - 0.9 x10E3/uL   EOS (ABSOLUTE) 0.1 0.0 - 0.4 x10E3/uL   Basophils Absolute 0.1 0.0 - 0.2 x10E3/uL   Immature Granulocytes 1 Not Estab. %   Immature Grans (Abs) 0.1 0.0 - 0.1 x10E3/uL  Lipid Panel w/o Chol/HDL Ratio  Result Value Ref Range   Cholesterol, Total 165 100 - 199 mg/dL   Triglycerides 629 (H) 0 - 149 mg/dL   HDL 40 >52 mg/dL   VLDL Cholesterol Cal 41 (H) 5 - 40 mg/dL   LDL Chol Calc (NIH) 84 0 - 99 mg/dL  PSA  Result Value Ref Range   Prostate Specific Ag, Serum 0.5 0.0 - 4.0 ng/mL  TSH  Result Value Ref Range   TSH 3.100 0.450 - 4.500 uIU/mL  Urinalysis, Routine w reflex microscopic  Result Value Ref Range   Specific Gravity, UA 1.020 1.005 - 1.030   pH, UA 6.0 5.0 - 7.5   Color, UA Yellow Yellow   Appearance Ur Clear Clear   Leukocytes,UA Negative Negative   Protein,UA Negative Negative/Trace   Glucose, UA Negative Negative   Ketones, UA Negative Negative   RBC, UA Negative Negative    Bilirubin, UA Negative Negative   Urobilinogen, Ur 0.2 0.2 - 1.0 mg/dL  Nitrite, UA Negative Negative   Microscopic Examination Comment   Microalbumin, Urine Waived  Result Value Ref Range   Microalb, Ur Waived 30 (H) 0 - 19 mg/L   Creatinine, Urine Waived 200 10 - 300 mg/dL   Microalb/Creat Ratio <30 <30 mg/g  Uric acid  Result Value Ref Range   Uric Acid 5.1 3.8 - 8.4 mg/dL   *Note: Due to a large number of results and/or encounters for the requested time period, some results have not been displayed. A complete set of results can be found in Results Review.      Assessment & Plan:   Problem List Items Addressed This Visit   None Visit Diagnoses     Viral upper respiratory tract infection    -  Primary   Will come in for flu and covid swabs. Treat with prednisone and tussionex. Call if not getting better or getting worse.   Relevant Orders   Novel Coronavirus, NAA ([facility])   Veritor Flu A/B Waived        Follow up plan: Return if symptoms worsen or fail to improve.   This visit was completed via video visit through MyChart due to the restrictions of the COVID-19 pandemic. All issues as above were discussed and addressed. Physical exam was done as above through visual confirmation on video through MyChart. If it was felt that the patient should be evaluated in the office, they were directed there. The patient verbally consented to this visit. Location of the patient: home Location of the provider: work Those involved with this call:  Provider: Olevia Perches, DO CMA: Malen Gauze, CMA Front Desk/Registration:  Servando Snare   Time spent on call:  15 minutes with patient face to face via video conference. More than 50% of this time was spent in counseling and coordination of care. 23 minutes total spent in review of patient's record and preparation of their chart.

## 2023-11-05 NOTE — Progress Notes (Signed)
Adbry RF to [place]. aw

## 2023-11-06 ENCOUNTER — Encounter: Payer: Self-pay | Admitting: Family Medicine

## 2023-11-06 LAB — NOVEL CORONAVIRUS, NAA: SARS-CoV-2, NAA: NOT DETECTED

## 2023-11-07 ENCOUNTER — Encounter: Payer: Self-pay | Admitting: Family Medicine

## 2023-11-08 ENCOUNTER — Telehealth: Payer: MEDICAID | Admitting: Family Medicine

## 2023-11-10 ENCOUNTER — Encounter: Payer: Self-pay | Admitting: Family Medicine

## 2023-11-11 ENCOUNTER — Encounter: Payer: Self-pay | Admitting: Family Medicine

## 2023-11-12 ENCOUNTER — Encounter: Payer: Self-pay | Admitting: Dermatology

## 2023-11-14 ENCOUNTER — Ambulatory Visit: Payer: MEDICAID

## 2023-11-17 ENCOUNTER — Encounter: Payer: Self-pay | Admitting: Family Medicine

## 2023-11-18 NOTE — Telephone Encounter (Signed)
Attempted to reach patient, LVM to call office back stated that there was an opening this pm if he would give [place] a call back to get scheduled.  Put in CRM.

## 2023-11-18 NOTE — Telephone Encounter (Signed)
Appt in person 

## 2023-11-21 ENCOUNTER — Ambulatory Visit: Payer: MEDICAID

## 2023-11-21 DIAGNOSIS — L209 Atopic dermatitis, unspecified: Secondary | ICD-10-CM | POA: Diagnosis not present

## 2023-11-21 NOTE — Progress Notes (Signed)
Patient here today for 2 week Adbry Injection for Severe Atopic Dermatitis.    Adbry 300mg /mL SQ Q2 wk  Adbry 300 mg/mL injected today to left upper arm. Patient tolerated well.    NDC: 25956-387-56 Lot: [id number] Exp: 07/2024  [name], RMA

## 2023-11-24 ENCOUNTER — Encounter: Payer: Self-pay | Admitting: Family Medicine

## 2023-11-26 ENCOUNTER — Encounter: Payer: Self-pay | Admitting: Family Medicine

## 2023-11-27 NOTE — Telephone Encounter (Signed)
I can see him today

## 2023-12-03 ENCOUNTER — Encounter: Payer: Self-pay | Admitting: Family Medicine

## 2023-12-03 ENCOUNTER — Encounter: Payer: Self-pay | Admitting: Dermatology

## 2023-12-04 ENCOUNTER — Encounter: Payer: Self-pay | Admitting: Dermatology

## 2023-12-05 ENCOUNTER — Ambulatory Visit: Payer: MEDICAID

## 2023-12-05 DIAGNOSIS — L209 Atopic dermatitis, unspecified: Secondary | ICD-10-CM | POA: Diagnosis not present

## 2023-12-05 NOTE — Progress Notes (Signed)
Patient here today for 2 week Adbry Injection for Severe Atopic Dermatitis.    Adbry 300mg /mL SQ Q2 wk  Adbry 300 mg/mL injected today to right upper arm. Patient tolerated well.    NDC: 62694-854-62 Lot: [id number] Exp: 11/2024  [name], RMA

## 2023-12-08 ENCOUNTER — Other Ambulatory Visit: Payer: Self-pay | Admitting: Dermatology

## 2023-12-13 ENCOUNTER — Encounter: Payer: Self-pay | Admitting: Family Medicine

## 2023-12-18 ENCOUNTER — Encounter: Payer: Self-pay | Admitting: Dermatology

## 2023-12-19 ENCOUNTER — Ambulatory Visit: Payer: MEDICAID

## 2023-12-19 DIAGNOSIS — L209 Atopic dermatitis, unspecified: Secondary | ICD-10-CM

## 2023-12-19 NOTE — Progress Notes (Addendum)
Patient here today for 2 week Adbry Injection for Severe Atopic Dermatitis.    Adbry 300mg /mL SQ Q2 wk  Adbry 300 mg/mL injected today to left upper arm. Patient tolerated well.    NDC: 01027-253-66 Lot: [id number] Exp: 11/2024  [name], RMA

## 2023-12-23 ENCOUNTER — Encounter: Payer: Self-pay | Admitting: Dermatology

## 2023-12-25 ENCOUNTER — Other Ambulatory Visit: Payer: Self-pay | Admitting: Dermatology

## 2023-12-25 DIAGNOSIS — L209 Atopic dermatitis, unspecified: Secondary | ICD-10-CM

## 2023-12-26 ENCOUNTER — Encounter: Payer: Self-pay | Admitting: Dermatology

## 2023-12-26 ENCOUNTER — Other Ambulatory Visit: Payer: Self-pay

## 2023-12-26 DIAGNOSIS — L209 Atopic dermatitis, unspecified: Secondary | ICD-10-CM

## 2023-12-26 MED ORDER — MUPIROCIN 2 % EX OINT: TOPICAL_OINTMENT | CUTANEOUS | 11 refills | Status: DC

## 2023-12-30 ENCOUNTER — Encounter: Payer: Self-pay | Admitting: Dermatology

## 2024-01-02 ENCOUNTER — Ambulatory Visit: Payer: MEDICAID

## 2024-01-02 DIAGNOSIS — L209 Atopic dermatitis, unspecified: Secondary | ICD-10-CM

## 2024-01-02 NOTE — Progress Notes (Signed)
 Patient here today for 2 week Adbry Injection for Severe Atopic Dermatitis.    Adbry 300mg /mL SQ Q2 wk  Adbry 300 mg/mL injected today to right upper arm. Patient tolerated well.    NDC: 25366-440-34 Lot: [id number] Exp: 11/2024  [name], RMA

## 2024-01-06 ENCOUNTER — Encounter: Payer: Self-pay | Admitting: Dermatology

## 2024-01-07 ENCOUNTER — Other Ambulatory Visit: Payer: Self-pay | Admitting: Family Medicine

## 2024-01-07 ENCOUNTER — Encounter: Payer: Self-pay | Admitting: Family Medicine

## 2024-01-07 MED ORDER — CYCLOBENZAPRINE HCL 10 MG PO TABS: 10.0000 mg | ORAL_TABLET | Freq: Every day | ORAL | 1 refills | Status: DC

## 2024-01-07 NOTE — Telephone Encounter (Signed)
 Requested medication (s) are due for refill today - yes  Requested medication (s) are on the active medication list -yes  Future visit scheduled -yes  Last refill: 11/01/23 #30 1RF  Notes to clinic: non delegated Rx  Requested Prescriptions  Pending Prescriptions Disp Refills   cyclobenzaprine  (FLEXERIL ) 10 MG tablet [Pharmacy Med Name: Cyclobenzaprine  HCl 10 MG Oral Tablet] 30 tablet 0    Sig: TAKE 1 TABLET BY MOUTH AT BEDTIME     Not Delegated - Analgesics:  Muscle Relaxants Failed - 01/07/2024 12:20 PM      Failed - This refill cannot be delegated      Passed - Valid encounter within last 6 months    Recent Outpatient Visits           2 months ago Viral upper respiratory tract infection   [place] [facility] [place], Megan P, DO   2 months ago Gastroesophageal reflux disease, unspecified whether esophagitis present   [place] [facility] [place], Megan P, DO   5 months ago Routine general medical examination at a health care facility   [facility], [place] P, DO   6 months ago Acute bilateral low back pain without sciatica   [place] [facility] [place], Megan P, DO   8 months ago Acute non-recurrent frontal sinusitis   [place] [facility] Melvin Pao, NP       Future Appointments             In 3 weeks Vicci, Duwaine SQUIBB, DO [place] [facility]   In 4 months Hester Alm BROCKS, MD [place] [facility]               Requested Prescriptions  Pending Prescriptions Disp Refills   cyclobenzaprine  (FLEXERIL ) 10 MG tablet [Pharmacy Med Name: Cyclobenzaprine  HCl 10 MG Oral Tablet] 30 tablet 0    Sig: TAKE 1 TABLET BY MOUTH AT BEDTIME     Not Delegated - Analgesics:  Muscle Relaxants Failed - 01/07/2024 12:20 PM      Failed - This refill cannot be delegated      Passed - Valid encounter within last 6 months    Recent Outpatient Visits            2 months ago Viral upper respiratory tract infection   [place] [facility] [place], Megan P, DO   2 months ago Gastroesophageal reflux disease, unspecified whether esophagitis present   [place] [facility] [place], Megan P, DO   5 months ago Routine general medical examination at a health care facility   [facility] [place], [place] P, DO   6 months ago Acute bilateral low back pain without sciatica   [place] [facility], Megan P, DO   8 months ago Acute non-recurrent frontal sinusitis   [place] [facility] Melvin Pao, NP       Future Appointments             In 3 weeks Vicci, Duwaine SQUIBB, DO [place] [facility]   In 4 months Hester Alm BROCKS, MD [facility]

## 2024-01-09 ENCOUNTER — Other Ambulatory Visit: Payer: Self-pay | Admitting: Dermatology

## 2024-01-10 ENCOUNTER — Encounter: Payer: Self-pay | Admitting: Family Medicine

## 2024-01-13 ENCOUNTER — Encounter: Payer: Self-pay | Admitting: Dermatology

## 2024-01-16 ENCOUNTER — Encounter: Payer: Self-pay | Admitting: Family Medicine

## 2024-01-16 ENCOUNTER — Ambulatory Visit: Payer: MEDICAID

## 2024-01-16 DIAGNOSIS — L209 Atopic dermatitis, unspecified: Secondary | ICD-10-CM | POA: Diagnosis not present

## 2024-01-16 NOTE — Progress Notes (Signed)
         Patient here today for 2 week Adbry injection for Severe Atopic Dermatitis.  Adbry 300mg /mLSQ Q2 wk Adbry 300 mg/mL injected today to right upper arm. Patient tolerated well.  NDC  16109-604-54 Lot # [id number] Exp: 11/2024  Butch Penny., RMA

## 2024-01-24 ENCOUNTER — Encounter: Payer: Self-pay | Admitting: Dermatology

## 2024-01-29 ENCOUNTER — Encounter: Payer: Self-pay | Admitting: Family Medicine

## 2024-01-30 ENCOUNTER — Ambulatory Visit: Payer: MEDICAID

## 2024-01-30 ENCOUNTER — Encounter: Payer: Self-pay | Admitting: Dermatology

## 2024-01-30 ENCOUNTER — Ambulatory Visit: Payer: MEDICAID | Admitting: Dermatology

## 2024-01-30 DIAGNOSIS — Z79899 Other long term (current) drug therapy: Secondary | ICD-10-CM | POA: Diagnosis not present

## 2024-01-30 DIAGNOSIS — B965 Pseudomonas (aeruginosa) (mallei) (pseudomallei) as the cause of diseases classified elsewhere: Secondary | ICD-10-CM | POA: Diagnosis not present

## 2024-01-30 DIAGNOSIS — L209 Atopic dermatitis, unspecified: Secondary | ICD-10-CM | POA: Diagnosis not present

## 2024-01-30 DIAGNOSIS — L2081 Atopic neurodermatitis: Secondary | ICD-10-CM

## 2024-01-30 DIAGNOSIS — A498 Other bacterial infections of unspecified site: Secondary | ICD-10-CM

## 2024-01-30 MED ORDER — TRALOKINUMAB-LDRM 150 MG/ML ~~LOC~~ SOSY
300.0000 mg | PREFILLED_SYRINGE | SUBCUTANEOUS | Status: DC
  Administered 2024-01-30: 300 mg via SUBCUTANEOUS

## 2024-01-30 MED ORDER — NEMLUVIO 30 MG ~~LOC~~ AUIJ: 60.0000 mg | AUTO-INJECTOR | SUBCUTANEOUS | 5 refills | Status: DC

## 2024-01-30 MED ORDER — TRIAMCINOLONE ACETONIDE 0.1 % EX CREA: 1.0000 | TOPICAL_CREAM | CUTANEOUS | 0 refills | Status: DC

## 2024-01-30 NOTE — Progress Notes (Signed)
   Follow-Up Visit   Subjective  Jesus Guerrero is a 53 y.o. male who presents for the following: Atopic derm trunk, extremities, Adbry 300mg  sq injections q 2 wks, Elidel bid, rash arms x 19m, itchy using elidel, check R great toe, bruised, not sure how long The patient has spots, moles and lesions to be evaluated, some may be new or changing and the patient may have concern these could be cancer.   The following portions of the chart were reviewed this encounter and updated as appropriate: medications, allergies, medical history  Review of Systems:  No other skin or systemic complaints except as noted in HPI or Assessment and Plan.  Objective  Well appearing patient in no apparent distress; mood and affect are within normal limits.   A focused examination was performed of the following areas: Arms, face, right foot  Relevant exam findings are noted in the Assessment and Plan.    Assessment & Plan   ATOPIC DERMATITIS Trunk extremities Exam: Erythemateous edematous macules bil upper arms forearms. Confluent scaly erythematous patches of hands 5-10% BSA  Chronic and persistent condition with duration or expected duration over one year. Condition is improving with treatment but not currently at goal.   Atopic dermatitis - Severe, on Dupixent / Adbry (biologic medication).  Atopic dermatitis (eczema) is a chronic, relapsing, pruritic condition that can significantly affect quality of life. It is often associated with allergic rhinitis and/or asthma and can require treatment with topical medications, phototherapy, or in severe cases biologic medications, which require long term medication management.    Treatment Plan: Adbry 150mg /79ml x 2 sq injections today to L upper arm, Lot [id number], exp 01/2025 Plan to change to [facility] in 2 wks. 60 mg then 30 mg every 4 weeks Cont Elidel cr bid aa eczema face/body Start TMC 0.1% cr bid until itchy rash on arms clear, avoid  f/g/a  Recommend gentle skin care.  Tralokinumab Carvel Getting) is a treatment given by injection for adults with moderate-to-severe atopic dermatitis. Goal is control of skin condition, not cure. It is given as 4 injections at the first dose followed by 2 injections ever 2 weeks thereafter.  Potential side effects include allergic reaction, injection site reactions and conjunctivitis (inflammation of the eyes).  The use of Adbry requires long term medication management, including periodic office visits.  Topical steroids (such as triamcinolone, fluocinolone, fluocinonide, mometasone, clobetasol, halobetasol, betamethasone, hydrocortisone) can cause thinning and lightening of the skin if they are used for too long in the same area. Your physician has selected the right strength medicine for your problem and area affected on the body. Please use your medication only as directed by your physician to prevent side effects.   NAIL PSEUDOMONAS R great toenail Exam: greenish discoloration R great toenail  Treatment Plan: White vinegar soaks bid, 1tbs of White vinager in 1 cup water     ATOPIC NEURODERMATITIS   Related Medications tralokinumab SOSY 300 mg  PSEUDOMONAS INFECTION   LONG-TERM USE OF HIGH-RISK MEDICATION    Return for 2 wks with nurse start nemluvio, 6wks nuse, 10wks Dr. Katrinka Blazing.  I, Ardis Rowan, RMA, am acting as scribe for Elie Goody, MD .   Documentation: I have reviewed the above documentation for accuracy and completeness, and I agree with the above.  Elie Goody, MD

## 2024-01-30 NOTE — Patient Instructions (Addendum)
Nail vinegar soaks 1 tablespoon of white vinegar to 1 cup of water, soak right great toenail 2 times daily  For eczema Start Triamcinoline cream 2 times daily to arms until rash clear, avoid face, groin, axilla Continue Elidel cream to eczema 2 times daily Will plan to start Nemluvio on follow up   Due to recent changes in healthcare laws, you may see results of your pathology and/or laboratory studies on MyChart before the doctors have had a chance to review them. We understand that in some cases there may be results that are confusing or concerning to you. Please understand that not all results are received at the same time and often the doctors may need to interpret multiple results in order to provide you with the best plan of care or course of treatment. Therefore, we ask that you please give [place] 2 business days to thoroughly review all your results before contacting the office for clarification. Should we see a critical lab result, you will be contacted sooner.   If You Need Anything After Your Visit  If you have any questions or concerns for your doctor, please call our main line at [phone] and press option 4 to reach your doctor's medical assistant. If no one answers, please leave a voicemail as directed and we will return your call as soon as possible. Messages left after 4 pm will be answered the following business day.   You may also send [place] a message via MyChart. We typically respond to MyChart messages within 1-2 business days.  For prescription refills, please ask your pharmacy to contact our office. Our fax number is [phone].  If you have an urgent issue when the clinic is closed that cannot wait until the next business day, you can page your doctor at the number below.    Please note that while we do our best to be available for urgent issues outside of office hours, we are not available 24/7.   If you have an urgent issue and are unable to reach [place], you may choose to  seek medical care at your doctor's office, retail clinic, urgent care center, or emergency room.  If you have a medical emergency, please immediately call 911 or go to the emergency department.  Pager Numbers  - Dr. Gwen Pounds: [phone]  - Dr. Roseanne Reno: [phone]  - Dr. Katrinka Blazing: [phone]   In the event of inclement weather, please call our main line at [phone] for an update on the status of any delays or closures.  Dermatology Medication Tips: Please keep the boxes that topical medications come in in order to help keep track of the instructions about where and how to use these. Pharmacies typically print the medication instructions only on the boxes and not directly on the medication tubes.   If your medication is too expensive, please contact our office at [phone] option 4 or send [place] a message through MyChart.   We are unable to tell what your co-pay for medications will be in advance as this is different depending on your insurance coverage. However, we may be able to find a substitute medication at lower cost or fill out paperwork to get insurance to cover a needed medication.   If a prior authorization is required to get your medication covered by your insurance company, please allow [place] 1-2 business days to complete this process.  Drug prices often vary depending on where the prescription is filled and some pharmacies may offer cheaper prices.  The website www.goodrx.com  contains coupons for medications through different pharmacies. The prices here do not account for what the cost may be with help from insurance (it may be cheaper with your insurance), but the website can give you the price if you did not use any insurance.  - You can print the associated coupon and take it with your prescription to the pharmacy.  - You may also stop by our office during regular business hours and pick up a GoodRx coupon card.  - If you need your prescription sent electronically to a  different pharmacy, notify our office through [facility] or by phone at [phone] option 4.     Si Usted Necesita Algo Despus de Su Visita  Tambin puede enviarnos un mensaje a travs de Clinical cytogeneticist. Por lo general respondemos a los mensajes de MyChart en el transcurso de 1 a 2 das hbiles.  Para renovar recetas, por favor pida a su farmacia que se ponga en contacto con nuestra oficina. Annie Sable de fax es [place] [phone].  Si tiene un asunto urgente cuando la clnica est cerrada y que no puede esperar hasta el siguiente da hbil, puede llamar/localizar a su doctor(a) al nmero que aparece a continuacin.   Por favor, tenga en cuenta que aunque hacemos todo lo posible para estar disponibles para asuntos urgentes fuera del horario de [place], no estamos disponibles las 24 horas del da, los 7 [address]  [address] de la [place].   Si tiene un problema urgente y no puede comunicarse con nosotros, puede optar por buscar atencin mdica  en el consultorio de su doctor(a), en una clnica privada, en un centro de atencin urgente o en una sala de emergencias.  Si tiene Engineer, drilling, por favor llame inmediatamente al 911 o vaya a la sala de emergencias.  Nmeros de bper  - Dr. Gwen Pounds: [phone]  - Dra. Roseanne Reno: [phone]  - Dr. Katrinka Blazing: [phone]   En caso de inclemencias del tiempo, por favor llame a Lacy Duverney principal al [phone] para una actualizacin sobre el [place] de cualquier retraso o cierre.  Consejos para la medicacin en dermatologa: Por favor, guarde las cajas en las que vienen los medicamentos de uso tpico para ayudarle a seguir las instrucciones sobre dnde y cmo usarlos. Las farmacias generalmente imprimen las instrucciones del medicamento slo en las cajas y no directamente en los tubos del [place].   Si su medicamento es muy caro, por favor, pngase en contacto con Rolm Gala llamando al [phone] y presione la opcin 4 o envenos un  mensaje a travs de Clinical cytogeneticist.   No podemos decirle cul ser su copago por los medicamentos por adelantado ya que esto es diferente dependiendo de la cobertura de su seguro. Sin embargo, es posible que podamos encontrar un medicamento sustituto a Audiological scientist un formulario para que el seguro cubra el medicamento que se considera necesario.   Si se requiere una autorizacin previa para que su compaa de seguros [place] su medicamento, por favor permtanos de 1 a 2 das hbiles para completar [address]  Los precios de los medicamentos varan con frecuencia dependiendo del Environmental consultant de dnde se surte la receta y alguna farmacias pueden ofrecer precios ms baratos.  El sitio web www.goodrx.com tiene cupones para medicamentos de Health and safety inspector. Los precios aqu no tienen en cuenta lo que podra costar con la ayuda del seguro (puede ser ms barato con su seguro), pero el sitio web puede darle el precio si no utiliz Tourist information centre manager.  - Puede imprimir  el cupn correspondiente y llevarlo con su receta a la farmacia.  - Tambin puede pasar por nuestra oficina durante el horario de atencin regular y Education officer, museum una tarjeta de cupones de GoodRx.  - Si necesita que su receta se enve electrnicamente a una farmacia diferente, informe a nuestra oficina a travs de MyChart de [place] o por telfono llamando al [phone] y presione la opcin 4.

## 2024-02-03 ENCOUNTER — Ambulatory Visit (INDEPENDENT_AMBULATORY_CARE_PROVIDER_SITE_OTHER): Payer: MEDICAID | Admitting: Family Medicine

## 2024-02-03 ENCOUNTER — Encounter: Payer: Self-pay | Admitting: Family Medicine

## 2024-02-03 VITALS — BP 138/65 | HR 85 | Temp 97.8°F | Ht 64.0 in | Wt 197.2 lb

## 2024-02-03 DIAGNOSIS — E782 Mixed hyperlipidemia: Secondary | ICD-10-CM

## 2024-02-03 DIAGNOSIS — K219 Gastro-esophageal reflux disease without esophagitis: Secondary | ICD-10-CM

## 2024-02-03 DIAGNOSIS — I10 Essential (primary) hypertension: Secondary | ICD-10-CM | POA: Diagnosis not present

## 2024-02-03 DIAGNOSIS — Z23 Encounter for immunization: Secondary | ICD-10-CM | POA: Diagnosis not present

## 2024-02-03 DIAGNOSIS — Z79899 Other long term (current) drug therapy: Secondary | ICD-10-CM

## 2024-02-03 DIAGNOSIS — M1A09X0 Idiopathic chronic gout, multiple sites, without tophus (tophi): Secondary | ICD-10-CM

## 2024-02-03 MED ORDER — MONTELUKAST SODIUM 10 MG PO TABS: 10.0000 mg | ORAL_TABLET | Freq: Every day | ORAL | 2 refills | Status: DC

## 2024-02-03 NOTE — Assessment & Plan Note (Signed)
 Under good control on current regimen. Continue current regimen. Continue to monitor. Call with any concerns. Refills up to date. Labs drawn today.

## 2024-02-03 NOTE — Progress Notes (Signed)
BP 138/65 (BP Location: Left Arm, Cuff Size: Normal)   Pulse 85   Temp 97.8 F (36.6 C) (Oral)   Ht 5\' 4"  (1.626 m)   Wt 197 lb 3.2 oz (89.4 kg)   SpO2 98%   BMI 33.85 kg/m    Subjective:    Patient ID: Jesus Guerrero, male    DOB: [birth year], 53 y.o.   MRN: [id number]  HPI: Jesus Guerrero is a 53 y.o. male  Chief Complaint  Patient presents with   Hypertension   Hyperlipidemia   Gout   Gastroesophageal Reflux   No gout flares. Tolerating his medicine well.  HYPERTENSION / HYPERLIPIDEMIA Satisfied with current treatment? yes Duration of hypertension: chronic BP monitoring frequency: not checking BP medication side effects: no Past BP meds: lisinopril Duration of hyperlipidemia: chronic Cholesterol medication side effects: no Cholesterol supplements: fish oil Past cholesterol medications: atorvastatin Medication compliance: excellent compliance Aspirin: no Recent stressors: no Recurrent headaches: no Visual changes: no Palpitations: no Dyspnea: no Chest pain: no Lower extremity edema: no Dizzy/lightheaded: no  GERD GERD control status: controlled Satisfied with current treatment? yes Medication side effects: no  Medication compliance: excellent Dysphagia: no Odynophagia:  no Hematemesis: no Blood in stool: no   Relevant past medical, surgical, family and social history reviewed and updated as indicated. Interim medical history since our last visit reviewed. Allergies and medications reviewed and updated.  Review of Systems  Constitutional: Negative.   Respiratory: Negative.    Cardiovascular: Negative.   Musculoskeletal: Negative.   Neurological: Negative.   Psychiatric/Behavioral: Negative.      Per HPI unless specifically indicated above     Objective:    BP 138/65 (BP Location: Left Arm, Cuff Size: Normal)   Pulse 85   Temp 97.8 F (36.6 C) (Oral)   Ht 5\' 4"  (1.626 m)   Wt 197 lb 3.2 oz (89.4 kg)   SpO2 98%   BMI 33.85 kg/m    Wt Readings from Last 3 Encounters:  02/03/24 197 lb 3.2 oz (89.4 kg)  11/01/23 198 lb 9.6 oz (90.1 kg)  08/01/23 196 lb 12.8 oz (89.3 kg)    Physical Exam Vitals and nursing note reviewed.  Constitutional:      General: He is not in acute distress.    Appearance: Normal appearance. He is not ill-appearing, toxic-appearing or diaphoretic.  HENT:     Head: Normocephalic and atraumatic.     Right Ear: External ear normal.     Left Ear: External ear normal.     Nose: Nose normal.     Mouth/Throat:     Mouth: Mucous membranes are moist.     Pharynx: Oropharynx is clear.  Eyes:     General: No scleral icterus.       Right eye: No discharge.        Left eye: No discharge.     Extraocular Movements: Extraocular movements intact.     Conjunctiva/sclera: Conjunctivae normal.     Pupils: Pupils are equal, round, and reactive to light.  Cardiovascular:     Rate and Rhythm: Normal rate and regular rhythm.     Pulses: Normal pulses.     Heart sounds: Normal heart sounds. No murmur heard.    No friction rub. No gallop.  Pulmonary:     Effort: Pulmonary effort is normal. No respiratory distress.     Breath sounds: Normal breath sounds. No stridor. No wheezing, rhonchi or rales.  Chest:     Chest wall:  No tenderness.  Musculoskeletal:        General: Normal range of motion.     Cervical back: Normal range of motion and neck supple.  Skin:    General: Skin is warm and dry.     Capillary Refill: Capillary refill takes less than 2 seconds.     Coloration: Skin is not jaundiced or pale.     Findings: No bruising, erythema, lesion or rash.  Neurological:     General: No focal deficit present.     Mental Status: He is alert and oriented to person, place, and time. Mental status is at baseline.  Psychiatric:        Mood and Affect: Mood normal.        Behavior: Behavior normal.        Thought Content: Thought content normal.        Judgment: Judgment normal.     Results for orders  placed or performed in visit on 11/05/23  Veritor Flu A/B Waived   Collection Time: 11/05/23  2:00 PM  Result Value Ref Range   Influenza A Negative Negative   Influenza B Negative Negative  Novel Coronavirus, NAA ([facility])   Collection Time: 11/05/23  3:30 PM   Specimen: Nasopharyngeal(NP) swabs in vial transport medium  Result Value Ref Range   SARS-CoV-2, NAA Not Detected Not Detected   *Note: Due to a large number of results and/or encounters for the requested time period, some results have not been displayed. A complete set of results can be found in Results Review.      Assessment & Plan:   Problem List Items Addressed This Visit       Cardiovascular and Mediastinum   Primary hypertension   Under good control on current regimen. Continue current regimen. Continue to monitor. Call with any concerns. Refills up to date. Labs drawn today.      Relevant Orders   CBC with Differential/Platelet   Comprehensive metabolic panel     Digestive   GERD (gastroesophageal reflux disease)   Under good control on current regimen. Continue current regimen. Continue to monitor. Call with any concerns. Refills up to date. Labs drawn today.       Relevant Orders   CBC with Differential/Platelet   Comprehensive metabolic panel     Other   Hyperlipidemia   Under good control on current regimen. Continue current regimen. Continue to monitor. Call with any concerns. Refills up to date. Labs drawn today.      Relevant Orders   Lipid Panel w/o Chol/HDL Ratio   CBC with Differential/Platelet   Comprehensive metabolic panel   Chronic gout - Primary   Under good control on current regimen. Continue current regimen. Continue to monitor. Call with any concerns. Refills up to date. Labs drawn today.      Relevant Orders   CBC with Differential/Platelet   Comprehensive metabolic panel   Uric acid   Other Visit Diagnoses       Long-term use of high-risk medication       Prevnar given  today.   Relevant Orders   Pneumococcal conjugate vaccine 20-valent (Prevnar 20) (Completed)        Follow up plan: Return in about 3 months (around 05/02/2024).

## 2024-02-04 LAB — CBC WITH DIFFERENTIAL/PLATELET
Basophils Absolute: 0 10*3/uL (ref 0.0–0.2)
Basos: 0 %
EOS (ABSOLUTE): 0.1 10*3/uL (ref 0.0–0.4)
Eos: 1 %
Hematocrit: 40.8 % (ref 37.5–51.0)
Hemoglobin: 13.9 g/dL (ref 13.0–17.7)
Immature Grans (Abs): 0 10*3/uL (ref 0.0–0.1)
Immature Granulocytes: 0 %
Lymphocytes Absolute: 1.9 10*3/uL (ref 0.7–3.1)
Lymphs: 20 %
MCH: 30.8 pg (ref 26.6–33.0)
MCHC: 34.1 g/dL (ref 31.5–35.7)
MCV: 90 fL (ref 79–97)
Monocytes Absolute: 0.6 10*3/uL (ref 0.1–0.9)
Monocytes: 6 %
Neutrophils Absolute: 6.9 10*3/uL (ref 1.4–7.0)
Neutrophils: 73 %
Platelets: 238 10*3/uL (ref 150–450)
RBC: 4.52 x10E6/uL (ref 4.14–5.80)
RDW: 13.1 % (ref 11.6–15.4)
WBC: 9.5 10*3/uL (ref 3.4–10.8)

## 2024-02-04 LAB — LIPID PANEL W/O CHOL/HDL RATIO
Cholesterol, Total: 143 mg/dL (ref 100–199)
HDL: 36 mg/dL — ABNORMAL LOW (ref >39)
LDL Chol Calc (NIH): 68 mg/dL (ref 0–99)
Triglycerides: 240 mg/dL — ABNORMAL HIGH (ref 0–149)
VLDL Cholesterol Cal: 39 mg/dL (ref 5–40)

## 2024-02-04 LAB — COMPREHENSIVE METABOLIC PANEL
ALT: 20 [IU]/L (ref 0–44)
AST: 21 [IU]/L (ref 0–40)
Albumin: 4.3 g/dL (ref 3.8–4.9)
Alkaline Phosphatase: 106 [IU]/L (ref 44–121)
BUN/Creatinine Ratio: 15 (ref 9–20)
BUN: 16 mg/dL (ref 6–24)
Bilirubin Total: 0.6 mg/dL (ref 0.0–1.2)
CO2: 22 mmol/L (ref 20–29)
Calcium: 9 mg/dL (ref 8.7–10.2)
Chloride: 103 mmol/L (ref 96–106)
Creatinine, Ser: 1.05 mg/dL (ref 0.76–1.27)
Globulin, Total: 2.5 g/dL (ref 1.5–4.5)
Glucose: 103 mg/dL — ABNORMAL HIGH (ref 70–99)
Potassium: 4.9 mmol/L (ref 3.5–5.2)
Sodium: 139 mmol/L (ref 134–144)
Total Protein: 6.8 g/dL (ref 6.0–8.5)
eGFR: 84 mL/min/{1.73_m2} (ref >59)

## 2024-02-04 LAB — URIC ACID: Uric Acid: 5.3 mg/dL (ref 3.8–8.4)

## 2024-02-05 ENCOUNTER — Encounter: Payer: Self-pay | Admitting: Family Medicine

## 2024-02-05 ENCOUNTER — Encounter: Payer: Self-pay | Admitting: Dermatology

## 2024-02-05 ENCOUNTER — Telehealth: Payer: Self-pay

## 2024-02-05 ENCOUNTER — Other Ambulatory Visit: Payer: Self-pay | Admitting: Dermatology

## 2024-02-05 NOTE — Telephone Encounter (Signed)
 Arnetha Bhat called to get the correct dosing for start of Nemluvio injection- start dose should be 60 mg- then once a month Nemluvio 30 mg for injection for Atopic dermatitis

## 2024-02-11 ENCOUNTER — Encounter: Payer: Self-pay | Admitting: Family Medicine

## 2024-02-13 ENCOUNTER — Encounter: Payer: Self-pay | Admitting: Dermatology

## 2024-02-13 ENCOUNTER — Ambulatory Visit (INDEPENDENT_AMBULATORY_CARE_PROVIDER_SITE_OTHER): Payer: MEDICAID

## 2024-02-13 ENCOUNTER — Telehealth: Payer: Self-pay

## 2024-02-13 DIAGNOSIS — L2081 Atopic neurodermatitis: Secondary | ICD-10-CM

## 2024-02-13 DIAGNOSIS — L209 Atopic dermatitis, unspecified: Secondary | ICD-10-CM

## 2024-02-13 MED ORDER — NEMOLIZUMAB-ILTO 30 MG ~~LOC~~ AUIJ
60.0000 mg | AUTO-INJECTOR | SUBCUTANEOUS | Status: AC
Start: 2024-02-13 — End: 2024-03-12
  Administered 2024-02-13 – 2024-03-12 (×2): 60 mg via SUBCUTANEOUS

## 2024-02-13 NOTE — Progress Notes (Signed)
Patient here today to start Nemluvio injections for Atopic Dermatitis.   Nemluvio 60mg  (2 30mg  pens) injection into each left and right arm. Patient tolerated injections well.  LOT: [id number] EXP: 07/2025  [name], RMA

## 2024-02-13 NOTE — Telephone Encounter (Signed)
Dr. Michaelle Copas patient.   Per last office note - pt to switch to [facility] today.  Okay to enter orders for injection?

## 2024-02-18 ENCOUNTER — Ambulatory Visit (INDEPENDENT_AMBULATORY_CARE_PROVIDER_SITE_OTHER): Payer: MEDICAID | Admitting: Podiatry

## 2024-02-18 ENCOUNTER — Encounter: Payer: Self-pay | Admitting: Family Medicine

## 2024-02-18 DIAGNOSIS — M7752 Other enthesopathy of left foot: Secondary | ICD-10-CM | POA: Diagnosis not present
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
## 2024-02-19 ENCOUNTER — Encounter: Payer: Self-pay | Admitting: Family Medicine

## 2024-02-21 ENCOUNTER — Encounter: Payer: Self-pay | Admitting: Family Medicine

## 2024-02-21 ENCOUNTER — Ambulatory Visit (INDEPENDENT_AMBULATORY_CARE_PROVIDER_SITE_OTHER): Payer: MEDICAID | Admitting: Family Medicine

## 2024-02-21 VITALS — BP 123/74 | HR 88 | Temp 98.2°F | Ht 64.0 in | Wt 198.8 lb

## 2024-02-21 DIAGNOSIS — M79602 Pain in left arm: Secondary | ICD-10-CM

## 2024-02-21 MED ORDER — KETOROLAC TROMETHAMINE 60 MG/2ML IM SOLN
60.0000 mg | Freq: Once | INTRAMUSCULAR | Status: AC
  Administered 2024-02-21: 60 mg via INTRAMUSCULAR

## 2024-02-21 NOTE — Progress Notes (Signed)
BP 123/74 (BP Location: Right Arm, Patient Position: Sitting, Cuff Size: Normal)   Pulse 88   Temp 98.2 F (36.8 C) (Oral)   Ht 5\' 4"  (1.626 m)   Wt 198 lb 12.8 oz (90.2 kg)   SpO2 100%   BMI 34.12 kg/m    Subjective:    Patient ID: Jesus Guerrero, male    DOB: February 02, 1971, 53 y.o.   MRN: [id number]  HPI: FOUAD TAUL is a 53 y.o. male  Chief Complaint  Patient presents with   Shoulder Pain    Paitent States the left should pain started two weeks no injury. It is hurting to lift up. It hurts moe at night time    ARM PAIN Duration: 2 weeks  Location: Upper arm and shoulder Mechanism of injury: unknown Onset: sudden Severity: moderate  Quality:  aching Frequency: intermittent Radiation: into his upper arm Aggravating factors: lifting it, laying down  Alleviating factors: nothing  Status: worse Treatments attempted: naproxen, flexeril  Relief with NSAIDs?:  no Swelling: no Redness: no  Warmth: no Trauma: no Chest pain: no  Shortness of breath: no  Fever: no Decreased sensation: no Paresthesias: no Weakness: no   Relevant past medical, surgical, family and social history reviewed and updated as indicated. Interim medical history since our last visit reviewed. Allergies and medications reviewed and updated.  Review of Systems  Constitutional: Negative.   Respiratory: Negative.    Cardiovascular: Negative.   Gastrointestinal: Negative.   Musculoskeletal:  Positive for arthralgias and myalgias. Negative for back pain, gait problem, joint swelling, neck pain and neck stiffness.  Skin: Negative.   Neurological: Negative.   Psychiatric/Behavioral: Negative.      Per HPI unless specifically indicated above     Objective:    BP 123/74 (BP Location: Right Arm, Patient Position: Sitting, Cuff Size: Normal)   Pulse 88   Temp 98.2 F (36.8 C) (Oral)   Ht 5\' 4"  (1.626 m)   Wt 198 lb 12.8 oz (90.2 kg)   SpO2 100%   BMI 34.12 kg/m   Wt Readings from  Last 3 Encounters:  02/21/24 198 lb 12.8 oz (90.2 kg)  02/03/24 197 lb 3.2 oz (89.4 kg)  11/01/23 198 lb 9.6 oz (90.1 kg)    Physical Exam Vitals and nursing note reviewed.  Constitutional:      General: He is not in acute distress.    Appearance: Normal appearance. He is not ill-appearing, toxic-appearing or diaphoretic.  HENT:     Head: Normocephalic and atraumatic.     Right Ear: External ear normal.     Left Ear: External ear normal.     Nose: Nose normal.     Mouth/Throat:     Mouth: Mucous membranes are moist.     Pharynx: Oropharynx is clear.  Eyes:     General: No scleral icterus.       Right eye: No discharge.        Left eye: No discharge.     Extraocular Movements: Extraocular movements intact.     Conjunctiva/sclera: Conjunctivae normal.     Pupils: Pupils are equal, round, and reactive to light.  Cardiovascular:     Rate and Rhythm: Normal rate and regular rhythm.     Pulses: Normal pulses.     Heart sounds: Normal heart sounds. No murmur heard.    No friction rub. No gallop.  Pulmonary:     Effort: Pulmonary effort is normal. No respiratory distress.  Breath sounds: Normal breath sounds. No stridor. No wheezing, rhonchi or rales.  Chest:     Chest wall: No tenderness.  Musculoskeletal:        General: No swelling, tenderness, deformity or signs of injury. Normal range of motion.     Cervical back: Normal range of motion and neck supple.     Right lower leg: No edema.     Left lower leg: No edema.  Skin:    General: Skin is warm and dry.     Capillary Refill: Capillary refill takes less than 2 seconds.     Coloration: Skin is not jaundiced or pale.     Findings: No bruising, erythema, lesion or rash.  Neurological:     General: No focal deficit present.     Mental Status: He is alert and oriented to person, place, and time. Mental status is at baseline.  Psychiatric:        Mood and Affect: Mood normal.        Behavior: Behavior normal.         Thought Content: Thought content normal.        Judgment: Judgment normal.     Results for orders placed or performed in visit on 02/03/24  Lipid Panel w/o Chol/HDL Ratio   Collection Time: 02/03/24 10:28 AM  Result Value Ref Range   Cholesterol, Total 143 100 - 199 mg/dL   Triglycerides 409 (H) 0 - 149 mg/dL   HDL 36 (L) >81 mg/dL   VLDL Cholesterol Cal 39 5 - 40 mg/dL   LDL Chol Calc (NIH) 68 0 - 99 mg/dL  CBC with Differential/Platelet   Collection Time: 02/03/24 10:28 AM  Result Value Ref Range   WBC 9.5 3.4 - 10.8 x10E3/uL   RBC 4.52 4.14 - 5.80 x10E6/uL   Hemoglobin 13.9 13.0 - 17.7 g/dL   Hematocrit 19.1 47.8 - 51.0 %   MCV 90 79 - 97 fL   MCH 30.8 26.6 - 33.0 pg   MCHC 34.1 31.5 - 35.7 g/dL   RDW 29.5 62.1 - 30.8 %   Platelets 238 150 - 450 x10E3/uL   Neutrophils 73 Not Estab. %   Lymphs 20 Not Estab. %   Monocytes 6 Not Estab. %   Eos 1 Not Estab. %   Basos 0 Not Estab. %   Neutrophils Absolute 6.9 1.4 - 7.0 x10E3/uL   Lymphocytes Absolute 1.9 0.7 - 3.1 x10E3/uL   Monocytes Absolute 0.6 0.1 - 0.9 x10E3/uL   EOS (ABSOLUTE) 0.1 0.0 - 0.4 x10E3/uL   Basophils Absolute 0.0 0.0 - 0.2 x10E3/uL   Immature Granulocytes 0 Not Estab. %   Immature Grans (Abs) 0.0 0.0 - 0.1 x10E3/uL  Comprehensive metabolic panel   Collection Time: 02/03/24 10:28 AM  Result Value Ref Range   Glucose 103 (H) 70 - 99 mg/dL   BUN 16 6 - 24 mg/dL   Creatinine, Ser 6.57 0.76 - 1.27 mg/dL   eGFR 84 >84 ON/GEX/5.28   BUN/Creatinine Ratio 15 9 - 20   Sodium 139 134 - 144 mmol/L   Potassium 4.9 3.5 - 5.2 mmol/L   Chloride 103 96 - 106 mmol/L   CO2 22 20 - 29 mmol/L   Calcium 9.0 8.7 - 10.2 mg/dL   Total Protein 6.8 6.0 - 8.5 g/dL   Albumin 4.3 3.8 - 4.9 g/dL   Globulin, Total 2.5 1.5 - 4.5 g/dL   Bilirubin Total 0.6 0.0 - 1.2 mg/dL   Alkaline Phosphatase 106  44 - 121 IU/L   AST 21 0 - 40 IU/L   ALT 20 0 - 44 IU/L  Uric acid   Collection Time: 02/03/24 10:28 AM  Result Value Ref Range    Uric Acid 5.3 3.8 - 8.4 mg/dL   *Note: Due to a large number of results and/or encounters for the requested time period, some results have not been displayed. A complete set of results can be found in Results Review.      Assessment & Plan:   Problem List Items Addressed This Visit   None Visit Diagnoses       Left arm pain    -  Primary   Will check x-rays and labs. Start stretches. If not better in 2 weeks will get him into PT. Call with any concerns.   Relevant Medications   ketorolac (TORADOL) injection 60 mg (Start on 02/21/2024 12:00 PM)   Other Relevant Orders   Sed Rate (ESR)   DG Shoulder Left   DG Cervical Spine Complete   CBC with Differential/Platelet   Basic metabolic panel        Follow up plan: Return for As scheduled.

## 2024-02-22 LAB — BASIC METABOLIC PANEL
BUN/Creatinine Ratio: 20 (ref 9–20)
BUN: 26 mg/dL — ABNORMAL HIGH (ref 6–24)
CO2: 20 mmol/L (ref 20–29)
Calcium: 9 mg/dL (ref 8.7–10.2)
Chloride: 105 mmol/L (ref 96–106)
Creatinine, Ser: 1.3 mg/dL — ABNORMAL HIGH (ref 0.76–1.27)
Glucose: 96 mg/dL (ref 70–99)
Potassium: 4.7 mmol/L (ref 3.5–5.2)
Sodium: 143 mmol/L (ref 134–144)
eGFR: 65 mL/min/{1.73_m2} (ref >59)

## 2024-02-22 LAB — CBC WITH DIFFERENTIAL/PLATELET
Basophils Absolute: 0.1 10*3/uL (ref 0.0–0.2)
Basos: 0 %
EOS (ABSOLUTE): 0.1 10*3/uL (ref 0.0–0.4)
Eos: 0 %
Hematocrit: 43.1 % (ref 37.5–51.0)
Hemoglobin: 14.2 g/dL (ref 13.0–17.7)
Immature Grans (Abs): 0 10*3/uL (ref 0.0–0.1)
Immature Granulocytes: 0 %
Lymphocytes Absolute: 2.3 10*3/uL (ref 0.7–3.1)
Lymphs: 21 %
MCH: 30.7 pg (ref 26.6–33.0)
MCHC: 32.9 g/dL (ref 31.5–35.7)
MCV: 93 fL (ref 79–97)
Monocytes Absolute: 0.7 10*3/uL (ref 0.1–0.9)
Monocytes: 6 %
Neutrophils Absolute: 8.1 10*3/uL — ABNORMAL HIGH (ref 1.4–7.0)
Neutrophils: 73 %
Platelets: 292 10*3/uL (ref 150–450)
RBC: 4.63 x10E6/uL (ref 4.14–5.80)
RDW: 13.5 % (ref 11.6–15.4)
WBC: 11.1 10*3/uL — ABNORMAL HIGH (ref 3.4–10.8)

## 2024-02-22 LAB — SEDIMENTATION RATE: Sed Rate: 39 mm/h — ABNORMAL HIGH (ref 0–30)

## 2024-02-23 ENCOUNTER — Other Ambulatory Visit: Payer: Self-pay | Admitting: Family Medicine

## 2024-02-23 ENCOUNTER — Encounter: Payer: Self-pay | Admitting: Family Medicine

## 2024-02-23 DIAGNOSIS — I10 Essential (primary) hypertension: Secondary | ICD-10-CM

## 2024-02-24 ENCOUNTER — Encounter: Payer: Self-pay | Admitting: Family Medicine

## 2024-02-24 ENCOUNTER — Ambulatory Visit: Payer: MEDICAID | Admitting: Family Medicine

## 2024-02-24 ENCOUNTER — Encounter: Payer: Self-pay | Admitting: Podiatry

## 2024-02-26 ENCOUNTER — Encounter: Payer: Self-pay | Admitting: Family Medicine

## 2024-02-26 ENCOUNTER — Other Ambulatory Visit: Payer: Self-pay | Admitting: Dermatology

## 2024-02-26 ENCOUNTER — Ambulatory Visit
Admission: RE | Admit: 2024-02-26 | Discharge: 2024-02-26 | Disposition: A | Discharge status: Home / Self Care | Payer: MEDICAID | Attending: Family Medicine | Admitting: Family Medicine

## 2024-02-26 ENCOUNTER — Ambulatory Visit
Admission: RE | Admit: 2024-02-26 | Discharge: 2024-02-26 | Disposition: A | Discharge status: Home / Self Care | Payer: MEDICAID | Source: Ambulatory Visit | Attending: Family Medicine | Admitting: Family Medicine

## 2024-02-26 DIAGNOSIS — M79602 Pain in left arm: Secondary | ICD-10-CM

## 2024-03-02 NOTE — Telephone Encounter (Signed)
 Called patient left message for patient to call and schedule appt for Arm and shoulder pain.

## 2024-03-03 ENCOUNTER — Encounter: Payer: Self-pay | Admitting: Family Medicine

## 2024-03-04 ENCOUNTER — Encounter: Payer: Self-pay | Admitting: Family Medicine

## 2024-03-04 DIAGNOSIS — M79602 Pain in left arm: Secondary | ICD-10-CM

## 2024-03-09 ENCOUNTER — Encounter: Payer: Self-pay | Admitting: Family Medicine

## 2024-03-10 ENCOUNTER — Encounter: Payer: Self-pay | Admitting: Family Medicine

## 2024-03-11 ENCOUNTER — Encounter: Payer: Self-pay | Admitting: Dermatology

## 2024-03-12 ENCOUNTER — Ambulatory Visit: Payer: MEDICAID

## 2024-03-12 DIAGNOSIS — L209 Atopic dermatitis, unspecified: Secondary | ICD-10-CM | POA: Diagnosis not present

## 2024-03-12 NOTE — Progress Notes (Signed)
 Patient here today for Nemluvio injection for Atopic Dermatitis.    Nemluvio 30mg  injected into each left arm. Patient tolerated injections well.   LOT: [id number] EXP: 07/2025   [name], RMA

## 2024-03-13 MED ORDER — CYCLOBENZAPRINE HCL 10 MG PO TABS: 10.0000 mg | ORAL_TABLET | Freq: Every day | ORAL | 0 refills | Status: DC

## 2024-03-16 ENCOUNTER — Encounter: Payer: Self-pay | Admitting: Family Medicine

## 2024-03-16 DIAGNOSIS — M7542 Impingement syndrome of left shoulder: Secondary | ICD-10-CM | POA: Insufficient documentation

## 2024-03-20 ENCOUNTER — Other Ambulatory Visit: Payer: MEDICAID

## 2024-03-20 DIAGNOSIS — I10 Essential (primary) hypertension: Secondary | ICD-10-CM

## 2024-03-21 LAB — BASIC METABOLIC PANEL
BUN/Creatinine Ratio: 29 — ABNORMAL HIGH (ref 9–20)
BUN: 32 mg/dL — ABNORMAL HIGH (ref 6–24)
CO2: 21 mmol/L (ref 20–29)
Calcium: 9.3 mg/dL (ref 8.7–10.2)
Chloride: 106 mmol/L (ref 96–106)
Creatinine, Ser: 1.11 mg/dL (ref 0.76–1.27)
Glucose: 89 mg/dL (ref 70–99)
Potassium: 4.7 mmol/L (ref 3.5–5.2)
Sodium: 140 mmol/L (ref 134–144)
eGFR: 79 mL/min/{1.73_m2} (ref >59)

## 2024-03-21 LAB — CBC WITH DIFFERENTIAL/PLATELET
Basophils Absolute: 0 10*3/uL (ref 0.0–0.2)
Basos: 0 %
EOS (ABSOLUTE): 0 10*3/uL (ref 0.0–0.4)
Eos: 0 %
Hematocrit: 40.3 % (ref 37.5–51.0)
Hemoglobin: 13.5 g/dL (ref 13.0–17.7)
Immature Grans (Abs): 0.1 10*3/uL (ref 0.0–0.1)
Immature Granulocytes: 1 %
Lymphocytes Absolute: 2.3 10*3/uL (ref 0.7–3.1)
Lymphs: 19 %
MCH: 30.8 pg (ref 26.6–33.0)
MCHC: 33.5 g/dL (ref 31.5–35.7)
MCV: 92 fL (ref 79–97)
Monocytes Absolute: 0.9 10*3/uL (ref 0.1–0.9)
Monocytes: 7 %
Neutrophils Absolute: 8.9 10*3/uL — ABNORMAL HIGH (ref 1.4–7.0)
Neutrophils: 73 %
Platelets: 269 10*3/uL (ref 150–450)
RBC: 4.38 x10E6/uL (ref 4.14–5.80)
RDW: 13.7 % (ref 11.6–15.4)
WBC: 12.1 10*3/uL — ABNORMAL HIGH (ref 3.4–10.8)

## 2024-03-22 ENCOUNTER — Encounter: Payer: Self-pay | Admitting: Dermatology

## 2024-03-23 ENCOUNTER — Encounter: Payer: Self-pay | Admitting: Family Medicine

## 2024-03-23 DIAGNOSIS — M25512 Pain in left shoulder: Secondary | ICD-10-CM | POA: Insufficient documentation

## 2024-03-29 ENCOUNTER — Encounter: Payer: Self-pay | Admitting: Family Medicine

## 2024-03-30 ENCOUNTER — Encounter: Payer: Self-pay | Admitting: Dermatology

## 2024-03-30 ENCOUNTER — Other Ambulatory Visit: Payer: Self-pay

## 2024-03-30 MED ORDER — LIDOCAINE 5 % EX CREA: TOPICAL_CREAM | CUTANEOUS | 1 refills | Status: AC

## 2024-04-08 ENCOUNTER — Encounter: Payer: Self-pay | Admitting: Dermatology

## 2024-04-13 ENCOUNTER — Ambulatory Visit: Payer: MEDICAID | Admitting: Dermatology

## 2024-04-13 ENCOUNTER — Encounter: Payer: Self-pay | Admitting: Dermatology

## 2024-04-14 ENCOUNTER — Encounter: Payer: Self-pay | Admitting: Family Medicine

## 2024-04-15 ENCOUNTER — Encounter: Payer: Self-pay | Admitting: Dermatology

## 2024-04-17 ENCOUNTER — Encounter: Payer: Self-pay | Admitting: Dermatology

## 2024-04-19 ENCOUNTER — Encounter: Payer: Self-pay | Admitting: Dermatology

## 2024-04-19 ENCOUNTER — Encounter: Payer: Self-pay | Admitting: Family Medicine

## 2024-04-20 ENCOUNTER — Other Ambulatory Visit: Payer: Self-pay | Admitting: Family Medicine

## 2024-04-20 MED ORDER — KETOCONAZOLE 2 % EX SHAM: 1.0000 | MEDICATED_SHAMPOO | Freq: Once | CUTANEOUS | 4 refills | Status: AC

## 2024-04-21 ENCOUNTER — Encounter: Payer: Self-pay | Admitting: Dermatology

## 2024-04-21 ENCOUNTER — Ambulatory Visit (INDEPENDENT_AMBULATORY_CARE_PROVIDER_SITE_OTHER): Payer: MEDICAID | Admitting: Dermatology

## 2024-04-21 ENCOUNTER — Encounter: Payer: Self-pay | Admitting: Family Medicine

## 2024-04-21 ENCOUNTER — Other Ambulatory Visit: Payer: Self-pay

## 2024-04-21 DIAGNOSIS — S0081XA Abrasion of other part of head, initial encounter: Secondary | ICD-10-CM

## 2024-04-21 DIAGNOSIS — L209 Atopic dermatitis, unspecified: Secondary | ICD-10-CM | POA: Diagnosis not present

## 2024-04-21 DIAGNOSIS — Z7189 Other specified counseling: Secondary | ICD-10-CM

## 2024-04-21 DIAGNOSIS — T148XXA Other injury of unspecified body region, initial encounter: Secondary | ICD-10-CM

## 2024-04-21 DIAGNOSIS — S50321A Blister (nonthermal) of right elbow, initial encounter: Secondary | ICD-10-CM

## 2024-04-21 DIAGNOSIS — S50311A Abrasion of right elbow, initial encounter: Secondary | ICD-10-CM

## 2024-04-21 DIAGNOSIS — Z79899 Other long term (current) drug therapy: Secondary | ICD-10-CM

## 2024-04-21 DIAGNOSIS — S0031XA Abrasion of nose, initial encounter: Secondary | ICD-10-CM

## 2024-04-21 MED ORDER — ELIDEL 1 % EX CREA: TOPICAL_CREAM | Freq: Two times a day (BID) | CUTANEOUS | 11 refills | Status: DC

## 2024-04-21 MED ORDER — NEMOLIZUMAB-ILTO 30 MG ~~LOC~~ AUIJ
30.0000 mg | AUTO-INJECTOR | Freq: Once | SUBCUTANEOUS | Status: AC
  Administered 2024-04-21: 30 mg via SUBCUTANEOUS

## 2024-04-21 MED ORDER — NEMOLIZUMAB-ILTO 30 MG ~~LOC~~ AUIJ
30.0000 mg | AUTO-INJECTOR | SUBCUTANEOUS | Status: DC
Start: 2024-05-19 — End: 2024-05-11

## 2024-04-21 NOTE — Progress Notes (Signed)
 Patient needs refills on Elidel  cream.

## 2024-04-21 NOTE — Progress Notes (Signed)
   Follow-Up Visit   Subjective  Jesus Guerrero is a 53 y.o. male who presents for the following: Atopic Dermatitis - pt states that he is doing well on Nemluvio with no side effects. He thought he had Elidel  at home for the breakout on his face, but he doesn't and would like refills today. Pt c/o excoriations on the face and elbows.  The following portions of the chart were reviewed this encounter and updated as appropriate: medications, allergies, medical history  Review of Systems:  No other skin or systemic complaints except as noted in HPI or Assessment and Plan.  Objective  Well appearing patient in no apparent distress; mood and affect are within normal limits.  Areas Examined: The face, arms, hands, elbows, and back  Relevant physical exam findings are noted in the Assessment and Plan.    Assessment & Plan   ATOPIC DERMATITIS, UNSPECIFIED TYPE   Related Medications tralokinumab  SOSY 300 mg  mupirocin  ointment (BACTROBAN ) 2 % APPLY TOPICALLY TO AFFECTED AREA ONCE DAILY nemolizumab-ilto (NEMLUVIO) SQ injection 30 mg  nemolizumab-ilto (NEMLUVIO) SQ injection 30 mg  COUNSELING AND COORDINATION OF CARE   LONG-TERM USE OF HIGH-RISK MEDICATION   EXCORIATION     ATOPIC DERMATITIS, improving with nemluvio per patient report Exam: erythematous papules coalescing to plaques on forearms. Excoriations around elbows, forehead, nose and R cheek. Back clear.   6% BSA  Chronic and persistent condition with duration or expected duration over one year. Condition is symptomatic/ bothersome to patient. Not currently at goal.  Atopic dermatitis (eczema) is a chronic, relapsing, pruritic condition that can significantly affect quality of life. It is often associated with allergic rhinitis and/or asthma and can require treatment with topical medications, phototherapy, or in severe cases biologic injectable medication (Dupixent ; Adbry ) or Oral JAK inhibitors.  Treatment  Plan: Restart Elidel  cream to aa's BID prn to forehead. Start Vaseline ointment to aa elbow.  Continue Nemluvio 30mg /mL SQ Q4W.   Nemluvio 30mg /mL injected SQ into the R upper arm. Patient tolerated injection well. AL, CMA. Continue 30 mg every 4 weeks  Recommend gentle skin care.  Return in about 3 months (around 07/21/2024) for [facility] follow up.  Arlinda Lais, CMA, am acting as scribe for Harris Liming, MD .  Documentation: I have reviewed the above documentation for accuracy and completeness, and I agree with the above.  Harris Liming, MD

## 2024-04-21 NOTE — Patient Instructions (Signed)

## 2024-04-21 NOTE — Telephone Encounter (Signed)
 Requested Prescriptions  Pending Prescriptions Disp Refills   cetirizine  (ZYRTEC ) 10 MG tablet [Pharmacy Med Name: Cetirizine  HCl 10 MG Oral Tablet] 30 tablet 0    Sig: Take 1 tablet by mouth once daily     Ear, Nose, and Throat:  Antihistamines 2 Passed - 04/21/2024  3:06 PM      Passed - Cr in normal range and within 360 days    Creatinine  Date Value Ref Range Status  11/30/2014 1.24 0.60 - 1.30 mg/dL Final   Creatinine, Ser  Date Value Ref Range Status  03/20/2024 1.11 0.76 - 1.27 mg/dL Final         Passed - Valid encounter within last 12 months    Recent Outpatient Visits           2 months ago Left arm pain   [facility] Yettem, Megan P, DO   2 months ago Chronic gout of multiple sites, unspecified cause   [facility] Solomon Dupre, DO       Future Appointments             In 2 weeks Lincoln Renshaw, Jerilee Montane, DO [facility]   In 4 weeks Elta Halter, MD [facility]   In 3 months Harris Liming, MD [facility]

## 2024-05-04 ENCOUNTER — Encounter: Payer: Self-pay | Admitting: Family Medicine

## 2024-05-08 ENCOUNTER — Encounter: Payer: Self-pay | Admitting: Family Medicine

## 2024-05-11 ENCOUNTER — Ambulatory Visit: Payer: MEDICAID | Admitting: Family Medicine

## 2024-05-11 ENCOUNTER — Encounter: Payer: Self-pay | Admitting: Family Medicine

## 2024-05-11 VITALS — BP 131/69 | HR 97 | Temp 97.9°F | Ht 64.0 in | Wt 198.6 lb

## 2024-05-11 DIAGNOSIS — S9032XA Contusion of left foot, initial encounter: Secondary | ICD-10-CM | POA: Diagnosis not present

## 2024-05-11 DIAGNOSIS — L739 Follicular disorder, unspecified: Secondary | ICD-10-CM

## 2024-05-11 DIAGNOSIS — E782 Mixed hyperlipidemia: Secondary | ICD-10-CM

## 2024-05-11 DIAGNOSIS — I10 Essential (primary) hypertension: Secondary | ICD-10-CM | POA: Diagnosis not present

## 2024-05-11 MED ORDER — MUPIROCIN 2 % EX OINT: TOPICAL_OINTMENT | CUTANEOUS | 11 refills | Status: DC

## 2024-05-11 MED ORDER — CYCLOBENZAPRINE HCL 10 MG PO TABS: 10.0000 mg | ORAL_TABLET | Freq: Every day | ORAL | 0 refills | Status: DC

## 2024-05-11 MED ORDER — HYDROXYZINE HCL 10 MG PO TABS: ORAL_TABLET | ORAL | 1 refills | Status: DC

## 2024-05-11 MED ORDER — OMEPRAZOLE 20 MG PO CPDR: 20.0000 mg | DELAYED_RELEASE_CAPSULE | Freq: Every day | ORAL | 1 refills | Status: DC

## 2024-05-11 MED ORDER — NAPROXEN 500 MG PO TABS: ORAL_TABLET | ORAL | 1 refills | Status: DC

## 2024-05-11 MED ORDER — ATORVASTATIN CALCIUM 20 MG PO TABS: 20.0000 mg | ORAL_TABLET | Freq: Every day | ORAL | 1 refills | Status: DC

## 2024-05-11 MED ORDER — ALLOPURINOL 300 MG PO TABS: 300.0000 mg | ORAL_TABLET | Freq: Every day | ORAL | 1 refills | Status: DC

## 2024-05-11 MED ORDER — LISINOPRIL 10 MG PO TABS: 10.0000 mg | ORAL_TABLET | Freq: Every day | ORAL | 1 refills | Status: DC

## 2024-05-11 NOTE — Progress Notes (Signed)
 BP 131/69 (BP Location: Left Arm, Patient Position: Sitting, Cuff Size: Normal)   Pulse 97   Temp 97.9 F (36.6 C) (Oral)   Ht 5\' 4"  (1.626 m)   Wt 198 lb 9.6 oz (90.1 kg)   SpO2 96%   BMI 34.09 kg/m    Subjective:    Patient ID: Jesus Guerrero, male    DOB: 21-Oct-1971, 53 y.o.   MRN: [id number]  HPI: Jesus Guerrero is a 53 y.o. male  Chief Complaint  Patient presents with   Rash    On chest   Foot Swelling    Left foot. Denies pain.   RASH Duration:  yesterday  Location: chest  Itching: no Burning: no Redness: yes Oozing: yes Scaling: no Blisters: no Painful: no Fevers: no Change in detergents/soaps/personal care products: no Recent illness: no Recent travel:no History of same: no Context: stable Alleviating factors: nothing Treatments attempted:lotion/moisturizer Shortness of breath: no  Throat/tongue swelling: no Myalgias/arthralgias: no  HYPERTENSION / HYPERLIPIDEMIA Satisfied with current treatment? yes Duration of hypertension: chronic BP monitoring frequency: rarely BP medication side effects: no Past BP meds: lisinopril  Duration of hyperlipidemia: chronic Cholesterol medication side effects: no Cholesterol supplements: none Past cholesterol medications: atorvastatin  Medication compliance: excellent compliance Aspirin: yes Recent stressors: no Recurrent headaches: no Visual changes: no Palpitations: no Dyspnea: no Chest pain: no Lower extremity edema: no Dizzy/lightheaded: no   Relevant past medical, surgical, family and social history reviewed and updated as indicated. Interim medical history since our last visit reviewed. Allergies and medications reviewed and updated.  Review of Systems  Constitutional: Negative.   Respiratory: Negative.    Cardiovascular: Negative.   Musculoskeletal: Negative.   Skin:  Positive for color change and rash. Negative for pallor and wound.  Neurological: Negative.   Psychiatric/Behavioral:  Negative.      Per HPI unless specifically indicated above     Objective:     BP 131/69 (BP Location: Left Arm, Patient Position: Sitting, Cuff Size: Normal)   Pulse 97   Temp 97.9 F (36.6 C) (Oral)   Ht 5\' 4"  (1.626 m)   Wt 198 lb 9.6 oz (90.1 kg)   SpO2 96%   BMI 34.09 kg/m   Wt Readings from Last 3 Encounters:  05/11/24 198 lb 9.6 oz (90.1 kg)  02/21/24 198 lb 12.8 oz (90.2 kg)  02/03/24 197 lb 3.2 oz (89.4 kg)    Physical Exam Vitals and nursing note reviewed.  Constitutional:      General: He is not in acute distress.    Appearance: Normal appearance. He is obese. He is not ill-appearing, toxic-appearing or diaphoretic.  HENT:     Head: Normocephalic and atraumatic.     Right Ear: External ear normal.     Left Ear: External ear normal.     Nose: Nose normal.     Mouth/Throat:     Mouth: Mucous membranes are moist.     Pharynx: Oropharynx is clear.  Eyes:     General: No scleral icterus.       Right eye: No discharge.        Left eye: No discharge.     Extraocular Movements: Extraocular movements intact.     Conjunctiva/sclera: Conjunctivae normal.     Pupils: Pupils are equal, round, and reactive to light.  Cardiovascular:     Rate and Rhythm: Normal rate and regular rhythm.     Pulses: Normal pulses.     Heart sounds: Normal heart sounds. No murmur  heard.    No friction rub. No gallop.  Pulmonary:     Effort: Pulmonary effort is normal. No respiratory distress.     Breath sounds: Normal breath sounds. No stridor. No wheezing, rhonchi or rales.  Chest:     Chest wall: No tenderness.  Musculoskeletal:        General: Normal range of motion.     Cervical back: Normal range of motion and neck supple.  Skin:    General: Skin is warm and dry.     Capillary Refill: Capillary refill takes less than 2 seconds.     Coloration: Skin is not jaundiced or pale.     Findings: No bruising, erythema, lesion or rash.     Comments: 2 small erythematous pustules on L  chest. Ecchymosis on L MTPs with some swelling of L 4th toe  Neurological:     General: No focal deficit present.     Mental Status: He is alert and oriented to person, place, and time. Mental status is at baseline.  Psychiatric:        Mood and Affect: Mood normal.        Behavior: Behavior normal.        Thought Content: Thought content normal.        Judgment: Judgment normal.     Results for orders placed or performed in visit on 03/20/24  Basic metabolic panel   Collection Time: 03/20/24  9:06 AM  Result Value Ref Range   Glucose 89 70 - 99 mg/dL   BUN 32 (H) 6 - 24 mg/dL   Creatinine, Ser 1.61 0.76 - 1.27 mg/dL   eGFR 79 >09 UE/AVW/0.98   BUN/Creatinine Ratio 29 (H) 9 - 20   Sodium 140 134 - 144 mmol/L   Potassium 4.7 3.5 - 5.2 mmol/L   Chloride 106 96 - 106 mmol/L   CO2 21 20 - 29 mmol/L   Calcium  9.3 8.7 - 10.2 mg/dL  CBC with Differential/Platelet   Collection Time: 03/20/24  9:06 AM  Result Value Ref Range   WBC 12.1 (H) 3.4 - 10.8 x10E3/uL   RBC 4.38 4.14 - 5.80 x10E6/uL   Hemoglobin 13.5 13.0 - 17.7 g/dL   Hematocrit 11.9 14.7 - 51.0 %   MCV 92 79 - 97 fL   MCH 30.8 26.6 - 33.0 pg   MCHC 33.5 31.5 - 35.7 g/dL   RDW 82.9 56.2 - 13.0 %   Platelets 269 150 - 450 x10E3/uL   Neutrophils 73 Not Estab. %   Lymphs 19 Not Estab. %   Monocytes 7 Not Estab. %   Eos 0 Not Estab. %   Basos 0 Not Estab. %   Neutrophils Absolute 8.9 (H) 1.4 - 7.0 x10E3/uL   Lymphocytes Absolute 2.3 0.7 - 3.1 x10E3/uL   Monocytes Absolute 0.9 0.1 - 0.9 x10E3/uL   EOS (ABSOLUTE) 0.0 0.0 - 0.4 x10E3/uL   Basophils Absolute 0.0 0.0 - 0.2 x10E3/uL   Immature Granulocytes 1 Not Estab. %   Immature Grans (Abs) 0.1 0.0 - 0.1 x10E3/uL   *Note: Due to a large number of results and/or encounters for the requested time period, some results have not been displayed. A complete set of results can be found in Results Review.      Assessment & Plan:   Problem List Items Addressed This Visit        Cardiovascular and Mediastinum   Primary hypertension   Under good control on current regimen. Continue current regimen.  Continue to monitor. Call with any concerns. Refills given.        Relevant Medications   lisinopril  (ZESTRIL ) 10 MG tablet   atorvastatin  (LIPITOR) 20 MG tablet     Other   Hyperlipidemia   Under good control on current regimen. Continue current regimen. Continue to monitor. Call with any concerns. Refills given.        Relevant Medications   lisinopril  (ZESTRIL ) 10 MG tablet   atorvastatin  (LIPITOR) 20 MG tablet   Other Visit Diagnoses       Folliculitis    -  Primary   Will treat with mupirocin . Call with any concerns. Continue to monitor.   Relevant Medications   mupirocin  ointment (BACTROBAN ) 2 %     Contusion of left foot, initial encounter       Advised rest and ice. Call if not getting better or getting worse.        Follow up plan: Return in about 3 months (around 08/11/2024) for physical with fasting labs.

## 2024-05-11 NOTE — Assessment & Plan Note (Signed)
 Under good control on current regimen. Continue current regimen. Continue to monitor. Call with any concerns. Refills given.

## 2024-05-15 ENCOUNTER — Encounter: Payer: Self-pay | Admitting: Dermatology

## 2024-05-16 ENCOUNTER — Encounter: Payer: Self-pay | Admitting: Podiatry

## 2024-05-16 ENCOUNTER — Encounter: Payer: Self-pay | Admitting: Dermatology

## 2024-05-20 ENCOUNTER — Encounter: Payer: Self-pay | Admitting: Dermatology

## 2024-05-20 ENCOUNTER — Ambulatory Visit (INDEPENDENT_AMBULATORY_CARE_PROVIDER_SITE_OTHER): Payer: MEDICAID | Admitting: Dermatology

## 2024-05-20 DIAGNOSIS — Z1283 Encounter for screening for malignant neoplasm of skin: Secondary | ICD-10-CM

## 2024-05-20 DIAGNOSIS — L280 Lichen simplex chronicus: Secondary | ICD-10-CM

## 2024-05-20 DIAGNOSIS — L304 Erythema intertrigo: Secondary | ICD-10-CM

## 2024-05-20 DIAGNOSIS — Z7189 Other specified counseling: Secondary | ICD-10-CM

## 2024-05-20 DIAGNOSIS — W908XXA Exposure to other nonionizing radiation, initial encounter: Secondary | ICD-10-CM

## 2024-05-20 DIAGNOSIS — L209 Atopic dermatitis, unspecified: Secondary | ICD-10-CM

## 2024-05-20 DIAGNOSIS — D229 Melanocytic nevi, unspecified: Secondary | ICD-10-CM

## 2024-05-20 DIAGNOSIS — S50311A Abrasion of right elbow, initial encounter: Secondary | ICD-10-CM | POA: Diagnosis not present

## 2024-05-20 DIAGNOSIS — L814 Other melanin hyperpigmentation: Secondary | ICD-10-CM

## 2024-05-20 DIAGNOSIS — L281 Prurigo nodularis: Secondary | ICD-10-CM

## 2024-05-20 DIAGNOSIS — L719 Rosacea, unspecified: Secondary | ICD-10-CM

## 2024-05-20 DIAGNOSIS — D1801 Hemangioma of skin and subcutaneous tissue: Secondary | ICD-10-CM

## 2024-05-20 DIAGNOSIS — Z79899 Other long term (current) drug therapy: Secondary | ICD-10-CM

## 2024-05-20 DIAGNOSIS — L578 Other skin changes due to chronic exposure to nonionizing radiation: Secondary | ICD-10-CM

## 2024-05-20 DIAGNOSIS — Z86018 Personal history of other benign neoplasm: Secondary | ICD-10-CM

## 2024-05-20 DIAGNOSIS — L821 Other seborrheic keratosis: Secondary | ICD-10-CM

## 2024-05-20 DIAGNOSIS — S50312A Abrasion of left elbow, initial encounter: Secondary | ICD-10-CM

## 2024-05-20 MED ORDER — NEMOLIZUMAB-ILTO 30 MG ~~LOC~~ AUIJ
30.0000 mg | AUTO-INJECTOR | SUBCUTANEOUS | Status: AC
  Administered 2024-05-20 – 2024-09-15 (×5): 30 mg via SUBCUTANEOUS

## 2024-05-20 NOTE — Patient Instructions (Addendum)
 Continue Nemluvio 30mg /mL every 4 weeks.   Continue Elidel  twice a day to affected areas on scalp, chest, elbow. Zoryve  sample given apply once or twice daily to affected areas.  Message us  if you would like a prescription.     Recommend daily broad spectrum sunscreen SPF 30+ to sun-exposed areas, reapply every 2 hours as needed. Call for new or changing lesions.  Staying in the shade or wearing long sleeves, sun glasses (UVA+UVB protection) and wide brim hats (4-inch brim around the entire circumference of the hat) are also recommended for sun protection.      Melanoma ABCDEs  Melanoma is the most dangerous type of skin cancer, and is the leading cause of death from skin disease.  You are more likely to develop melanoma if you: Have light-colored skin, light-colored eyes, or red or blond hair Spend a lot of time in the sun Tan regularly, either outdoors or in a tanning bed Have had blistering sunburns, especially during childhood Have a close family member who has had a melanoma Have atypical moles or large birthmarks  Early detection of melanoma is key since treatment is typically straightforward and cure rates are extremely high if we catch it early.   The first sign of melanoma is often a change in a mole or a new dark spot.  The ABCDE system is a way of remembering the signs of melanoma.  A for asymmetry:  The two halves do not match. B for border:  The edges of the growth are irregular. C for color:  A mixture of colors are present instead of an even brown color. D for diameter:  Melanomas are usually (but not always) greater than 6mm - the size of a pencil eraser. E for evolution:  The spot keeps changing in size, shape, and color.  Please check your skin once per month between visits. You can use a small mirror in front and a large mirror behind you to keep an eye on the back side or your body.   If you see any new or changing lesions before your next follow-up, please call  to schedule a visit.  Please continue daily skin protection including broad spectrum sunscreen SPF 30+ to sun-exposed areas, reapplying every 2 hours as needed when you're outdoors.   Staying in the shade or wearing long sleeves, sun glasses (UVA+UVB protection) and wide brim hats (4-inch brim around the entire circumference of the hat) are also recommended for sun protection.  Due to recent changes in healthcare laws, you may see results of your pathology and/or laboratory studies on MyChart before the doctors have had a chance to review them. We understand that in some cases there may be results that are confusing or concerning to you. Please understand that not all results are received at the same time and often the doctors may need to interpret multiple results in order to provide you with the best plan of care or course of treatment. Therefore, we ask that you please give us  2 business days to thoroughly review all your results before contacting the office for clarification. Should we see a critical lab result, you will be contacted sooner.   If You Need Anything After Your Visit  If you have any questions or concerns for your doctor, please call our main line at [phone] and press option 4 to reach your doctor's medical assistant. If no one answers, please leave a voicemail as directed and we will return your call as soon as possible.  Messages left after 4 pm will be answered the following business day.   You may also send us  a message via MyChart. We typically respond to MyChart messages within 1-2 business days.  For prescription refills, please ask your pharmacy to contact our office. Our fax number is [phone].  If you have an urgent issue when the clinic is closed that cannot wait until the next business day, you can page your doctor at the number below.    Please note that while we do our best to be available for urgent issues outside of office hours, we are not available 24/7.    If you have an urgent issue and are unable to reach us , you may choose to seek medical care at your doctor's office, retail clinic, urgent care center, or emergency room.  If you have a medical emergency, please immediately call 911 or go to the emergency department.  Pager Numbers  - Dr. Bary Likes: [phone]  - Dr. Annette Barters: [phone]  - Dr. Felipe Horton: [phone]   In the event of inclement weather, please call our main line at [phone] for an update on the status of any delays or closures.  Dermatology Medication Tips: Please keep the boxes that topical medications come in in order to help keep track of the instructions about where and how to use these. Pharmacies typically print the medication instructions only on the boxes and not directly on the medication tubes.   If your medication is too expensive, please contact our office at [phone] option 4 or send us  a message through MyChart.   We are unable to tell what your co-pay for medications will be in advance as this is different depending on your insurance coverage. However, we may be able to find a substitute medication at lower cost or fill out paperwork to get insurance to cover a needed medication.   If a prior authorization is required to get your medication covered by your insurance company, please allow us  1-2 business days to complete this process.  Drug prices often vary depending on where the prescription is filled and some pharmacies may offer cheaper prices.  The website www.goodrx.com contains coupons for medications through different pharmacies. The prices here do not account for what the cost may be with help from insurance (it may be cheaper with your insurance), but the website can give you the price if you did not use any insurance.  - You can print the associated coupon and take it with your prescription to the pharmacy.  - You may also stop by our office during regular business hours and pick up a  GoodRx coupon card.  - If you need your prescription sent electronically to a different pharmacy, notify our office through [facility] or by phone at [phone] option 4.     Si Usted Necesita Algo Despus de Su Visita  Tambin puede enviarnos un mensaje a travs de Clinical cytogeneticist. Por lo general respondemos a los mensajes de MyChart en el transcurso de 1 a 2 das hbiles.  Para renovar recetas, por favor pida a su farmacia que se ponga en contacto con nuestra oficina. Franz Jacks de fax es [place] [phone].  Si tiene un asunto urgente cuando la clnica est cerrada y que no puede esperar hasta el siguiente da hbil, puede llamar/localizar a su doctor(a) al nmero que aparece a continuacin.   Por favor, tenga en cuenta que aunque hacemos todo lo posible para estar disponibles para asuntos urgentes fuera del horario de [place],  no estamos disponibles las 24 horas del da, los 7 [address]  [address] de la [place].   Si tiene un problema urgente y no puede comunicarse con nosotros, puede optar por buscar atencin mdica  en el consultorio de su doctor(a), en una clnica privada, en un centro de atencin urgente o en una sala de emergencias.  Si tiene Engineer, drilling, por favor llame inmediatamente al 911 o vaya a la sala de emergencias.  Nmeros de bper  - Dr. Bary Likes: [phone]  - Dra. Annette Barters: [phone]  - Dr. Felipe Horton: [phone]   En caso de inclemencias del tiempo, por favor llame a Lajuan Pila principal al [phone] para una actualizacin sobre el [place] de cualquier retraso o cierre.  Consejos para la medicacin en dermatologa: Por favor, guarde las cajas en las que vienen los medicamentos de uso tpico para ayudarle a seguir las instrucciones sobre dnde y cmo usarlos. Las farmacias generalmente imprimen las instrucciones del medicamento slo en las cajas y no directamente en los tubos del [place].   Si su medicamento es muy caro, por favor, pngase en contacto  con Bettyjane Brunet llamando al [phone] y presione la opcin 4 o envenos un mensaje a travs de Clinical cytogeneticist.   No podemos decirle cul ser su copago por los medicamentos por adelantado ya que esto es diferente dependiendo de la cobertura de su seguro. Sin embargo, es posible que podamos encontrar un medicamento sustituto a Audiological scientist un formulario para que el seguro cubra el medicamento que se considera necesario.   Si se requiere una autorizacin previa para que su compaa de seguros [place] su medicamento, por favor permtanos de 1 a 2 das hbiles para completar este proceso.  Los precios de los medicamentos varan con frecuencia dependiendo del Environmental consultant de dnde se surte la receta y alguna farmacias pueden ofrecer precios ms baratos.  El sitio web www.goodrx.com tiene cupones para medicamentos de Health and safety inspector. Los precios aqu no tienen en cuenta lo que podra costar con la ayuda del seguro (puede ser ms barato con su seguro), pero el sitio web puede darle el precio si no utiliz Tourist information centre manager.  - Puede imprimir el cupn correspondiente y llevarlo con su receta a la farmacia.  - Tambin puede pasar por nuestra oficina durante el horario de atencin regular y Education officer, museum una tarjeta de cupones de GoodRx.  - Si necesita que su receta se enve electrnicamente a una farmacia diferente, informe a nuestra oficina a travs de MyChart de [place] o por telfono llamando al [phone] y presione la opcin 4.

## 2024-05-20 NOTE — Progress Notes (Signed)
 Follow-Up Visit   Subjective  Jesus Guerrero is a 53 y.o. male who presents for the following: Skin Cancer Screening and Full Body Skin Exam. Hx of dysplastic nevi. No personal Hx of skin cancer.   Atopic dermatitis. On Nemluvio every 4 weeks.  Started in February 2025. Patient reports Nemluvio is helping. Dermatitis has improved. Denies side effects, adverse reactions, injection site reactions.   The patient presents for Total-Body Skin Exam (TBSE) for skin cancer screening and mole check. The patient has spots, moles and lesions to be evaluated, some may be new or changing and the patient may have concern these could be cancer.  The following portions of the chart were reviewed this encounter and updated as appropriate: medications, allergies, medical history  Review of Systems:  No other skin or systemic complaints except as noted in HPI or Assessment and Plan.  Objective  Well appearing patient in no apparent distress; mood and affect are within normal limits.  A full examination was performed including scalp, head, eyes, ears, nose, lips, neck, chest, axillae, abdomen, back, buttocks, bilateral upper extremities, bilateral lower extremities, hands, feet, fingers, toes, fingernails, and toenails. All findings within normal limits unless otherwise noted below.   Relevant physical exam findings are noted in the Assessment and Plan.                   Assessment & Plan   SKIN CANCER SCREENING PERFORMED TODAY.  HISTORY OF DYSPLASTIC NEVI No evidence of recurrence today Recommend regular full body skin exams Recommend daily broad spectrum sunscreen SPF 30+ to sun-exposed areas, reapply every 2 hours as needed.  Call if any new or changing lesions are noted between office visits   ACTINIC DAMAGE - Chronic condition, secondary to cumulative UV/sun exposure - diffuse scaly erythematous macules with underlying dyspigmentation - Recommend daily broad spectrum  sunscreen SPF 30+ to sun-exposed areas, reapply every 2 hours as needed.  - Staying in the shade or wearing long sleeves, sun glasses (UVA+UVB protection) and wide brim hats (4-inch brim around the entire circumference of the hat) are also recommended for sun protection.  - Call for new or changing lesions.  LENTIGINES, SEBORRHEIC KERATOSES, HEMANGIOMAS - Benign normal skin lesions - Benign-appearing - Call for any changes  MELANOCYTIC NEVI - Tan-brown and/or pink-flesh-colored symmetric macules and papules - Benign appearing on exam today - Observation - Call clinic for new or changing moles - Recommend daily use of broad spectrum spf 30+ sunscreen to sun-exposed areas.   ATOPIC DERMATITIS, prurigo nodularis, improving with nemluvio per patient report Exam: Excoriations around elbows. Back clear.   6% BSA Chronic and persistent condition with duration or expected duration over one year. Condition is improving with treatment but not currently at goal. Atopic dermatitis (eczema) is a chronic, relapsing, pruritic condition that can significantly affect quality of life. It is often associated with allergic rhinitis and/or asthma and can require treatment with topical medications, phototherapy, or in severe cases biologic injectable medication (Dupixent ; Adbry ) or Oral JAK inhibitors.   Treatment Plan: Continue Elidel  twice a day to affected areas on scalp, chest, elbow. Zoryve  sample given apply once or twice daily to affected areas. If does not work well will give sample of Vtama at next visit.  Lot: Andria Banks Exp: 01/30/2026  Continue Nemluvio 30mg /mL SQ Q4W.    Nemluvio 30mg /mL injected SQ into the L upper arm. Patient tolerated injection well.  NDC: 9147-8295-62 Lot: [id number] Exp: 08/30/2025   Recommend gentle skin care.  LICHEN SIMPLEX CHRONICUS/PRURIGO NODULARIS Exam: Excoriated lichenified papules at scalp, chest, elbows Chronic and persistent condition with duration or expected  duration over one year. Condition is symptomatic/ bothersome to patient. Not currently at goal. Lichen simplex chronicus [facility]) is a persistent itchy area of thickened skin that is induced by chronic rubbing and/or scratching (chronic dermatitis).  These areas may be pink, hyperpigmented and may have excoriations and bumps (prurigo nodules- PN).  LSC/PN is commonly observed in uncontrolled atopic dermatitis and other forms of eczema, and in other itchy skin conditions (eg, insect bites, scabies).  Sometimes it is not possible to know initial cause of LSC/PN if it has been present for a long time.  It generally responds well to treatment with high potency topical steroids.  It is important to stop rubbing/scratching the area in order to break the itch-scratch-rash-itch cycle, in order for the rash to resolve.   Treatment Plan: Continue Elidel  twice a day to affected areas on scalp, chest, elbow. Zoryve  sample given apply once or twice daily to affected areas.  Avoid picking/rubbing/scratching   ROSACEA Exam Mid face erythema with telangiectasias  Chronic and persistent condition with duration or expected duration over one year. Condition is bothersome/symptomatic for patient. Currently flared. Rosacea is a chronic progressive skin condition usually affecting the face of adults, causing redness and/or acne bumps. It is treatable but not curable. It sometimes affects the eyes (ocular rosacea) as well. It may respond to topical and/or systemic medication and can flare with stress, sun exposure, alcohol, exercise, topical steroids (including hydrocortisone /cortisone 10) and some foods.  Daily application of broad spectrum spf 30+ sunscreen to face is recommended to reduce flares.  Patient denies grittiness of the eyes  Treatment Plan Patient deferred treatment at this time.   INTERTRIGO Exam: Pink patches at B/L axilla Chronic and persistent condition with duration or expected duration over one  year. Condition is bothersome/symptomatic for patient. Currently flared. Intertrigo is a chronic recurrent rash that occurs in skin fold areas that may be associated with friction; heat; moisture; yeast; fungus; and bacteria.  It is exacerbated by increased movement / activity; sweating; and higher atmospheric temperature.  Use of an absorbant powder such as Zeasorb AF powder or other OTC antifungal powder to the area daily can prevent rash recurrence. Other options to help keep the area dry include blow drying the area after bathing or using antiperspirant products such as Duradry sweat minimizing gel.  Treatment Plan: Patient deferred treatment at this time.   ATOPIC DERMATITIS, UNSPECIFIED TYPE   Related Medications nemolizumab-ilto (NEMLUVIO) SQ injection 30 mg   Return in about 1 year (around 05/20/2025) for TBSE, With Dr. Bary Likes, 4 weeks nurse schedule/injection, AD follow up with Dr. Felipe Horton as scheduled.  I, Jill Parcell, CMA, am acting as scribe for Celine Collard, MD.   Documentation: I have reviewed the above documentation for accuracy and completeness, and I agree with the above.  Celine Collard, MD

## 2024-05-21 ENCOUNTER — Encounter: Payer: Self-pay | Admitting: Dermatology

## 2024-05-21 DIAGNOSIS — L209 Atopic dermatitis, unspecified: Secondary | ICD-10-CM

## 2024-05-26 MED ORDER — ZORYVE 0.3 % EX CREA
TOPICAL_CREAM | CUTANEOUS | 1 refills | Status: DC
Start: 2024-05-26 — End: 2024-06-02

## 2024-05-28 ENCOUNTER — Encounter: Payer: Self-pay | Admitting: Family Medicine

## 2024-05-28 ENCOUNTER — Other Ambulatory Visit: Payer: Self-pay | Admitting: Family Medicine

## 2024-05-29 MED ORDER — FLUTICASONE PROPIONATE 50 MCG/ACT NA SUSP: 2.0000 | Freq: Every day | NASAL | 12 refills | Status: AC

## 2024-06-02 ENCOUNTER — Other Ambulatory Visit: Payer: Self-pay

## 2024-06-02 DIAGNOSIS — L209 Atopic dermatitis, unspecified: Secondary | ICD-10-CM

## 2024-06-02 MED ORDER — ZORYVE 0.15 % EX CREA: 1.0000 | TOPICAL_CREAM | Freq: Every day | CUTANEOUS | 5 refills | Status: DC

## 2024-06-05 ENCOUNTER — Encounter: Payer: Self-pay | Admitting: Family Medicine

## 2024-06-06 ENCOUNTER — Other Ambulatory Visit: Payer: Self-pay | Admitting: Dermatology

## 2024-06-06 DIAGNOSIS — L304 Erythema intertrigo: Secondary | ICD-10-CM

## 2024-06-07 ENCOUNTER — Encounter: Payer: Self-pay | Admitting: Family Medicine

## 2024-06-09 ENCOUNTER — Encounter: Payer: Self-pay | Admitting: Dermatology

## 2024-06-13 ENCOUNTER — Encounter: Payer: Self-pay | Admitting: Dermatology

## 2024-06-15 ENCOUNTER — Encounter: Payer: Self-pay | Admitting: Dermatology

## 2024-06-15 ENCOUNTER — Encounter: Payer: Self-pay | Admitting: Family Medicine

## 2024-06-15 NOTE — Telephone Encounter (Signed)
 Dr. Lincoln Renshaw can we add this patient to one of your same day slots?

## 2024-06-15 NOTE — Telephone Encounter (Signed)
 Appt scheduled

## 2024-06-16 ENCOUNTER — Encounter: Payer: Self-pay | Admitting: Family Medicine

## 2024-06-16 ENCOUNTER — Encounter: Payer: Self-pay | Admitting: Dermatology

## 2024-06-16 NOTE — Telephone Encounter (Signed)
Pt has appt scheduled this week

## 2024-06-16 NOTE — Telephone Encounter (Signed)
 Pt responded to and has appt on 6/20

## 2024-06-17 ENCOUNTER — Ambulatory Visit: Payer: MEDICAID

## 2024-06-17 ENCOUNTER — Encounter: Payer: Self-pay | Admitting: Family Medicine

## 2024-06-17 DIAGNOSIS — L209 Atopic dermatitis, unspecified: Secondary | ICD-10-CM

## 2024-06-17 NOTE — Progress Notes (Signed)
 Patient here today for Nemluvio  injection.   Nemluvio  30mg /mL injected SQ into the Right  upper arm. Patient tolerated injection well.   NDC: 4098-1191-47 Lot: [id number] Exp: 09/29/2026  Cooper Denver RMA

## 2024-06-19 ENCOUNTER — Ambulatory Visit: Payer: MEDICAID | Admitting: Family Medicine

## 2024-07-01 ENCOUNTER — Encounter: Payer: Self-pay | Admitting: Dermatology

## 2024-07-05 ENCOUNTER — Encounter: Payer: Self-pay | Admitting: Dermatology

## 2024-07-14 ENCOUNTER — Encounter: Payer: Self-pay | Admitting: Dermatology

## 2024-07-17 ENCOUNTER — Encounter: Payer: Self-pay | Admitting: Podiatry

## 2024-07-20 ENCOUNTER — Ambulatory Visit: Payer: MEDICAID | Admitting: Podiatry

## 2024-07-20 NOTE — Telephone Encounter (Signed)
 Advised patient to schedule appt to be treated in office.

## 2024-07-21 ENCOUNTER — Ambulatory Visit (INDEPENDENT_AMBULATORY_CARE_PROVIDER_SITE_OTHER): Payer: MEDICAID | Admitting: Dermatology

## 2024-07-21 ENCOUNTER — Encounter: Payer: Self-pay | Admitting: Dermatology

## 2024-07-21 DIAGNOSIS — L659 Nonscarring hair loss, unspecified: Secondary | ICD-10-CM

## 2024-07-21 DIAGNOSIS — L209 Atopic dermatitis, unspecified: Secondary | ICD-10-CM | POA: Diagnosis not present

## 2024-07-21 DIAGNOSIS — Z79899 Other long term (current) drug therapy: Secondary | ICD-10-CM

## 2024-07-21 DIAGNOSIS — S0001XA Abrasion of scalp, initial encounter: Secondary | ICD-10-CM | POA: Diagnosis not present

## 2024-07-21 DIAGNOSIS — Z7189 Other specified counseling: Secondary | ICD-10-CM

## 2024-07-21 DIAGNOSIS — T148XXA Other injury of unspecified body region, initial encounter: Secondary | ICD-10-CM

## 2024-07-21 MED ORDER — NEMOLIZUMAB-ILTO 30 MG ~~LOC~~ AUIJ
30.0000 mg | AUTO-INJECTOR | SUBCUTANEOUS | Status: AC
  Administered 2024-10-13: 30 mg via SUBCUTANEOUS

## 2024-07-21 NOTE — Progress Notes (Signed)
   Follow-Up Visit   Subjective  Jesus Guerrero is a 53 y.o. male who presents for the following: Atopic Dermatitis - pt states that condition is about the same on Nemluvio , and he is tolerating medication weel with no side effects. Topically he uses Zoryve  cream and Elidel  cream BID everyday.   The following portions of the chart were reviewed this encounter and updated as appropriate: medications, allergies, medical history  Review of Systems:  No other skin or systemic complaints except as noted in HPI or Assessment and Plan.  Objective  Well appearing patient in no apparent distress; mood and affect are within normal limits.  Areas Examined: The face, arms, hands, elbows, and back  Relevant physical exam findings are noted in the Assessment and Plan.    Assessment & Plan   ATOPIC DERMATITIS, UNSPECIFIED TYPE  ATOPIC DERMATITIS, improving with nemluvio  Exam: excoriation and traumatic hair loss on vertex scalp, B/L med elbows, back clear  1% BSA improved from 6% on 05/20/24  Chronic and persistent condition with duration or expected duration over one year. Condition is symptomatic/ bothersome to patient. Not currently at goal.  Atopic dermatitis (eczema) is a chronic, relapsing, pruritic condition that can significantly affect quality of life. It is often associated with allergic rhinitis and/or asthma and can require treatment with topical medications, phototherapy, or in severe cases biologic injectable medication (Dupixent ; Adbry ) or Oral JAK inhibitors.  Treatment Plan: Continue Elidel  and Zoryve  cream BID to aa's PRN.   Continue Nemluvio  30mg /mL SQ Q4W.   Nemluvio  30mg /mL injected SQ into the L upper arm. Patient tolerated injection well. AL, CMA.   Recommend gentle skin care. Related Medications nemolizumab-ilto  (NEMLUVIO ) SQ injection 30 mg  Roflumilast  (ZORYVE ) 0.15 % CREA Apply 1 Application topically daily. Apply to aa's eczema QD PRN. nemolizumab-ilto   (NEMLUVIO ) SQ injection 30 mg  COUNSELING AND COORDINATION OF CARE   MEDICATION MANAGEMENT   LONG-TERM USE OF HIGH-RISK MEDICATION   EXCORIATION     Return for appointment as scheduled; Nemluvio  injection Q4W with MA.  I, Rosina Mayans, CMA, am acting as scribe for Boneta Sharps, MD .  Documentation: I have reviewed the above documentation for accuracy and completeness, and I agree with the above.  Boneta Sharps, MD

## 2024-07-21 NOTE — Patient Instructions (Signed)

## 2024-07-28 ENCOUNTER — Encounter: Payer: Self-pay | Admitting: Family Medicine

## 2024-07-28 ENCOUNTER — Encounter: Payer: Self-pay | Admitting: Podiatry

## 2024-07-28 ENCOUNTER — Ambulatory Visit (INDEPENDENT_AMBULATORY_CARE_PROVIDER_SITE_OTHER): Payer: MEDICAID | Admitting: Podiatry

## 2024-07-28 DIAGNOSIS — M79675 Pain in left toe(s): Secondary | ICD-10-CM

## 2024-07-28 DIAGNOSIS — M79674 Pain in right toe(s): Secondary | ICD-10-CM | POA: Diagnosis not present

## 2024-07-28 DIAGNOSIS — B351 Tinea unguium: Secondary | ICD-10-CM

## 2024-07-28 NOTE — Progress Notes (Signed)
  Subjective:  Patient ID: Jesus Guerrero, male    DOB: [birth year],  MRN: [id number]  Chief Complaint  Patient presents with   Nail Problem    Pt would like to have his nails trimmed    53 y.o. male returns for the patient presents with thickened elongated dystrophic mycotic toenails x 10 mild pain on palpation hurts with ambulation he would like to have it debrided down above complaint.   Objective:  There were no vitals filed for this visit. Podiatric Exam: Vascular: dorsalis pedis and posterior tibial pulses are palpable bilateral. Capillary return is immediate. Temperature gradient is WNL. Skin turgor WNL  Sensorium: Normal Semmes Weinstein monofilament test. Normal tactile sensation bilaterally. Nail Exam: Pt has thick disfigured discolored nails with subungual debris noted bilateral entire nail hallux through fifth toenails.  Pain on palpation to the nails. Ulcer Exam: There is no evidence of ulcer or pre-ulcerative changes or infection. Orthopedic Exam: Muscle tone and strength are WNL. No limitations in general ROM. No crepitus or effusions noted.  Skin: No Porokeratosis. No infection or ulcers    Assessment & Plan:   1. Pain due to onychomycosis of toenails of both feet     Patient was evaluated and treated and all questions answered.  Onychomycosis with pain  -Nails palliatively debrided as below. -Educated on self-care  Procedure: Nail Debridement Rationale: pain  Type of Debridement: manual, sharp debridement. Instrumentation: Nail nipper, rotary burr. Number of Nails: 10  Procedures and Treatment: Consent by patient was obtained for treatment procedures. The patient understood the discussion of treatment and procedures well. All questions were answered thoroughly reviewed. Debridement of mycotic and hypertrophic toenails, 1 through 5 bilateral and clearing of subungual debris. No ulceration, no infection noted.  Return Visit-Office Procedure: Patient instructed  to return to the office for a follow up visit 3 months for continued evaluation and treatment.  Franky Blanch, DPM    No follow-ups on file.

## 2024-07-29 ENCOUNTER — Encounter: Payer: Self-pay | Admitting: Family Medicine

## 2024-07-30 ENCOUNTER — Encounter: Payer: Self-pay | Admitting: Podiatry

## 2024-08-01 ENCOUNTER — Encounter: Payer: Self-pay | Admitting: Dermatology

## 2024-08-02 ENCOUNTER — Encounter: Payer: Self-pay | Admitting: Podiatry

## 2024-08-09 ENCOUNTER — Encounter: Payer: Self-pay | Admitting: Family Medicine

## 2024-08-09 ENCOUNTER — Encounter: Payer: Self-pay | Admitting: Dermatology

## 2024-08-13 ENCOUNTER — Encounter: Payer: Self-pay | Admitting: Family Medicine

## 2024-08-13 ENCOUNTER — Ambulatory Visit (INDEPENDENT_AMBULATORY_CARE_PROVIDER_SITE_OTHER): Payer: MEDICAID | Admitting: Family Medicine

## 2024-08-13 VITALS — BP 138/81 | HR 88 | Temp 97.9°F | Ht 64.0 in | Wt 193.2 lb

## 2024-08-13 DIAGNOSIS — Z0000 Encounter for general adult medical examination without abnormal findings: Secondary | ICD-10-CM | POA: Diagnosis not present

## 2024-08-13 LAB — MICROALBUMIN, URINE WAIVED
Creatinine, Urine Waived: 100 mg/dL (ref 10–300)
Microalb, Ur Waived: 30 mg/L — ABNORMAL HIGH (ref 0–19)
Microalb/Creat Ratio: <30 mg/g (ref <30)

## 2024-08-13 LAB — BAYER DCA HB A1C WAIVED: HB A1C (BAYER DCA - WAIVED): 6.1 % — ABNORMAL HIGH (ref 4.8–5.6)
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
## 2024-08-14 ENCOUNTER — Encounter: Payer: Self-pay | Admitting: Family Medicine

## 2024-08-14 LAB — CBC WITH DIFFERENTIAL/PLATELET
Basophils Absolute: 0 x10E3/uL (ref 0.0–0.2)
Basos: 1 %
EOS (ABSOLUTE): 0.1 x10E3/uL (ref 0.0–0.4)
Eos: 1 %
Hematocrit: 42.9 % (ref 37.5–51.0)
Hemoglobin: 14 g/dL (ref 13.0–17.7)
Immature Grans (Abs): 0 x10E3/uL (ref 0.0–0.1)
Immature Granulocytes: 0 %
Lymphocytes Absolute: 1.9 x10E3/uL (ref 0.7–3.1)
Lymphs: 21 %
MCH: 30.2 pg (ref 26.6–33.0)
MCHC: 32.6 g/dL (ref 31.5–35.7)
MCV: 93 fL (ref 79–97)
Monocytes Absolute: 0.6 x10E3/uL (ref 0.1–0.9)
Monocytes: 7 %
Neutrophils Absolute: 6.2 x10E3/uL (ref 1.4–7.0)
Neutrophils: 69 %
Platelets: 239 x10E3/uL (ref 150–450)
RBC: 4.64 x10E6/uL (ref 4.14–5.80)
RDW: 13.7 % (ref 11.6–15.4)
WBC: 8.8 x10E3/uL (ref 3.4–10.8)

## 2024-08-14 LAB — COMPREHENSIVE METABOLIC PANEL WITH GFR
ALT: 18 IU/L (ref 0–44)
AST: 18 IU/L (ref 0–40)
Albumin: 4.2 g/dL (ref 3.8–4.9)
Alkaline Phosphatase: 84 IU/L (ref 44–121)
BUN/Creatinine Ratio: 21 — ABNORMAL HIGH (ref 9–20)
BUN: 23 mg/dL (ref 6–24)
Bilirubin Total: 0.4 mg/dL (ref 0.0–1.2)
CO2: 20 mmol/L (ref 20–29)
Calcium: 9.4 mg/dL (ref 8.7–10.2)
Chloride: 102 mmol/L (ref 96–106)
Creatinine, Ser: 1.1 mg/dL (ref 0.76–1.27)
Globulin, Total: 2.4 g/dL (ref 1.5–4.5)
Glucose: 103 mg/dL — ABNORMAL HIGH (ref 70–99)
Potassium: 5 mmol/L (ref 3.5–5.2)
Sodium: 137 mmol/L (ref 134–144)
Total Protein: 6.6 g/dL (ref 6.0–8.5)
eGFR: 80 mL/min/1.73 (ref >59)

## 2024-08-14 LAB — LIPID PANEL W/O CHOL/HDL RATIO
Cholesterol, Total: 155 mg/dL (ref 100–199)
HDL: 42 mg/dL (ref >39)
LDL Chol Calc (NIH): 81 mg/dL (ref 0–99)
Triglycerides: 190 mg/dL — ABNORMAL HIGH (ref 0–149)
VLDL Cholesterol Cal: 32 mg/dL (ref 5–40)

## 2024-08-14 LAB — TSH: TSH: 2.65 u[IU]/mL (ref 0.450–4.500)

## 2024-08-14 LAB — URIC ACID: Uric Acid: 4.2 mg/dL (ref 3.8–8.4)

## 2024-08-14 LAB — HEPATITIS B SURFACE ANTIBODY, QUANTITATIVE: Hepatitis B Surf Ab Quant: <3.5 m[IU]/mL — ABNORMAL LOW

## 2024-08-14 LAB — PSA: Prostate Specific Ag, Serum: 0.5 ng/mL (ref 0.0–4.0)

## 2024-08-15 ENCOUNTER — Encounter: Payer: Self-pay | Admitting: Family Medicine

## 2024-08-16 ENCOUNTER — Ambulatory Visit: Payer: Self-pay | Admitting: Family Medicine

## 2024-08-16 ENCOUNTER — Encounter: Payer: Self-pay | Admitting: Family Medicine

## 2024-08-18 ENCOUNTER — Ambulatory Visit (INDEPENDENT_AMBULATORY_CARE_PROVIDER_SITE_OTHER): Payer: MEDICAID

## 2024-08-18 ENCOUNTER — Ambulatory Visit: Payer: MEDICAID | Admitting: Podiatry

## 2024-08-18 DIAGNOSIS — L209 Atopic dermatitis, unspecified: Secondary | ICD-10-CM

## 2024-08-18 NOTE — Progress Notes (Signed)
 Patient here today for Nemluvio  injection.    Nemluvio  30mg /mL injected SQ into the Right  upper arm. Patient tolerated injection well.    NDC: 9700-3779-84 Lot: [id number] Exp: 10/29/2026   Alan Pizza RMA

## 2024-08-19 ENCOUNTER — Encounter: Payer: Self-pay | Admitting: Family Medicine

## 2024-08-20 ENCOUNTER — Encounter: Payer: Self-pay | Admitting: Podiatry

## 2024-08-20 ENCOUNTER — Ambulatory Visit (INDEPENDENT_AMBULATORY_CARE_PROVIDER_SITE_OTHER): Payer: MEDICAID | Admitting: Podiatry

## 2024-08-20 ENCOUNTER — Ambulatory Visit: Payer: MEDICAID | Admitting: Podiatry

## 2024-08-20 VITALS — Ht 64.0 in | Wt 193.2 lb

## 2024-08-20 DIAGNOSIS — L97512 Non-pressure chronic ulcer of other part of right foot with fat layer exposed: Secondary | ICD-10-CM
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
## 2024-08-21 ENCOUNTER — Encounter: Payer: Self-pay | Admitting: Podiatry

## 2024-08-21 NOTE — Telephone Encounter (Signed)
 Spoke with patient advised on dressing changes.

## 2024-08-24 ENCOUNTER — Encounter: Payer: Self-pay | Admitting: Dermatology

## 2024-08-25 MED ORDER — PIMECROLIMUS 1 % EX CREA
TOPICAL_CREAM | Freq: Two times a day (BID) | CUTANEOUS | 5 refills | Status: AC
Start: 2024-08-25 — End: ?

## 2024-08-31 ENCOUNTER — Encounter: Payer: Self-pay | Admitting: Family Medicine

## 2024-09-02 ENCOUNTER — Encounter: Payer: Self-pay | Admitting: Dermatology

## 2024-09-02 ENCOUNTER — Encounter: Payer: Self-pay | Admitting: Family Medicine

## 2024-09-03 ENCOUNTER — Encounter: Payer: Self-pay | Admitting: Podiatry

## 2024-09-04 NOTE — Telephone Encounter (Signed)
 He is not immune. He can have the hep B shot when he comes in for a flu shot

## 2024-09-07 ENCOUNTER — Encounter: Payer: Self-pay | Admitting: Podiatry

## 2024-09-08 ENCOUNTER — Encounter: Payer: Self-pay | Admitting: Family Medicine

## 2024-09-10 ENCOUNTER — Ambulatory Visit (INDEPENDENT_AMBULATORY_CARE_PROVIDER_SITE_OTHER): Payer: MEDICAID | Admitting: Podiatry

## 2024-09-10 DIAGNOSIS — L97512 Non-pressure chronic ulcer of other part of right foot with fat layer exposed: Secondary | ICD-10-CM | POA: Diagnosis not present
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
## 2024-09-12 ENCOUNTER — Encounter: Payer: Self-pay | Admitting: Dermatology

## 2024-09-13 ENCOUNTER — Other Ambulatory Visit: Payer: Self-pay | Admitting: Dermatology

## 2024-09-15 ENCOUNTER — Ambulatory Visit: Payer: MEDICAID

## 2024-09-15 DIAGNOSIS — L209 Atopic dermatitis, unspecified: Secondary | ICD-10-CM

## 2024-09-15 NOTE — Progress Notes (Signed)
 Patient here today for Nemluvio  injection.    Nemluvio  30mg /mL injected SQ into the left  upper arm. Patient tolerated injection well.    NDC: 9700-3779-84 Lot: [id number] Exp: 11/30/2026   Alan Pizza RMA

## 2024-09-16 ENCOUNTER — Encounter: Payer: Self-pay | Admitting: Dermatology

## 2024-09-17 ENCOUNTER — Encounter: Payer: Self-pay | Admitting: Family Medicine

## 2024-09-17 ENCOUNTER — Ambulatory Visit: Payer: MEDICAID

## 2024-09-18 ENCOUNTER — Encounter: Payer: Self-pay | Admitting: Family Medicine

## 2024-09-18 NOTE — Telephone Encounter (Signed)
 Scheduled

## 2024-09-20 ENCOUNTER — Encounter: Payer: Self-pay | Admitting: Family Medicine

## 2024-09-21 NOTE — Telephone Encounter (Signed)
 Called patient and left a message for him to call back to get scheduled. Dr Vicci does not have any openings tomorrow.

## 2024-09-21 NOTE — Telephone Encounter (Signed)
 Scheduled

## 2024-09-22 ENCOUNTER — Ambulatory Visit: Payer: MEDICAID

## 2024-09-22 ENCOUNTER — Encounter: Payer: Self-pay | Admitting: Dermatology

## 2024-09-22 ENCOUNTER — Encounter: Payer: Self-pay | Admitting: Family Medicine

## 2024-09-23 ENCOUNTER — Encounter: Payer: Self-pay | Admitting: Family Medicine

## 2024-09-23 ENCOUNTER — Ambulatory Visit (INDEPENDENT_AMBULATORY_CARE_PROVIDER_SITE_OTHER): Payer: MEDICAID | Admitting: Family Medicine

## 2024-09-23 VITALS — BP 147/77 | HR 97 | Temp 97.9°F | Ht 64.0 in | Wt 195.4 lb

## 2024-09-23 DIAGNOSIS — J069 Acute upper respiratory infection, unspecified: Secondary | ICD-10-CM

## 2024-09-23 MED ORDER — PREDNISONE 50 MG PO TABS: 50.0000 mg | ORAL_TABLET | Freq: Every day | ORAL | 0 refills | Status: DC

## 2024-09-23 NOTE — Progress Notes (Signed)
 BP (!) 147/77 (BP Location: Left Arm, Patient Position: Sitting, Cuff Size: Normal)   Pulse 97   Temp 97.9 F (36.6 C) (Oral)   Ht 5' 4 (1.626 m)   Wt 195 lb 6.4 oz (88.6 kg)   SpO2 96%   BMI 33.54 kg/m    Subjective:    Patient ID: Jesus Guerrero, male    DOB: 01-Apr-1971, 53 y.o.   MRN: [id number]  HPI: Jesus Guerrero is a 53 y.o. male  Chief Complaint  Patient presents with   Cough    Onset Saturday. Dry cough. Denies body aches, chills, fevers, or sob.    Nasal Congestion    Running nose    UPPER RESPIRATORY TRACT INFECTION Duration: 5 days Worst symptom: congestion, cough Fever: no Cough: yes Shortness of breath: no Wheezing: no Chest pain: no Chest tightness: no Chest congestion: yes Nasal congestion: yes Runny nose: yes Post nasal drip: no Sneezing: yes Sore throat: no Swollen glands: no Sinus pressure: yes Headache: no Face pain: no Toothache: no Ear pain: no  Ear pressure: no  Eyes red/itching:no Eye drainage/crusting: no  Vomiting: no Rash: no Fatigue: yes Sick contacts: yes Strep contacts: no  Context: stable Recurrent sinusitis: no Relief with OTC cold/cough medications: no  Treatments attempted: alka-seltzer plus   Relevant past medical, surgical, family and social history reviewed and updated as indicated. Interim medical history since our last visit reviewed. Allergies and medications reviewed and updated.  Review of Systems  Constitutional:  Positive for fatigue. Negative for activity change, appetite change, chills, diaphoresis, fever and unexpected weight change.  HENT:  Positive for congestion, postnasal drip, rhinorrhea and sinus pressure. Negative for dental problem, drooling, ear discharge, ear pain, facial swelling, hearing loss, mouth sores, nosebleeds, sinus pain, sneezing, sore throat, tinnitus, trouble swallowing and voice change.   Respiratory:  Positive for cough. Negative for apnea, choking, chest tightness,  shortness of breath, wheezing and stridor.   Cardiovascular: Negative.   Gastrointestinal: Negative.   Musculoskeletal: Negative.   Psychiatric/Behavioral: Negative.      Per HPI unless specifically indicated above     Objective:    BP (!) 147/77 (BP Location: Left Arm, Patient Position: Sitting, Cuff Size: Normal)   Pulse 97   Temp 97.9 F (36.6 C) (Oral)   Ht 5' 4 (1.626 m)   Wt 195 lb 6.4 oz (88.6 kg)   SpO2 96%   BMI 33.54 kg/m   Wt Readings from Last 3 Encounters:  09/23/24 195 lb 6.4 oz (88.6 kg)  08/20/24 193 lb 3.2 oz (87.6 kg)  08/13/24 193 lb 3.2 oz (87.6 kg)    Physical Exam Vitals and nursing note reviewed.  Constitutional:      General: He is not in acute distress.    Appearance: Normal appearance. He is obese. He is not ill-appearing, toxic-appearing or diaphoretic.  HENT:     Head: Normocephalic and atraumatic.     Right Ear: Tympanic membrane, ear canal and external ear normal. There is no impacted cerumen.     Left Ear: Tympanic membrane, ear canal and external ear normal. There is no impacted cerumen.     Nose: Congestion and rhinorrhea present.     Mouth/Throat:     Mouth: Mucous membranes are moist.     Pharynx: Oropharynx is clear. No oropharyngeal exudate or posterior oropharyngeal erythema.  Eyes:     General: No scleral icterus.       Right eye: No discharge.  Left eye: No discharge.     Extraocular Movements: Extraocular movements intact.     Conjunctiva/sclera: Conjunctivae normal.     Pupils: Pupils are equal, round, and reactive to light.  Cardiovascular:     Rate and Rhythm: Normal rate and regular rhythm.     Pulses: Normal pulses.     Heart sounds: Normal heart sounds. No murmur heard.    No friction rub. No gallop.  Pulmonary:     Effort: Pulmonary effort is normal. No respiratory distress.     Breath sounds: Normal breath sounds. No stridor. No wheezing, rhonchi or rales.     Comments: Coarse breath sounds  bilaterally Chest:     Chest wall: No tenderness.  Musculoskeletal:        General: Normal range of motion.     Cervical back: Normal range of motion and neck supple.  Skin:    General: Skin is warm and dry.     Capillary Refill: Capillary refill takes less than 2 seconds.     Coloration: Skin is not jaundiced or pale.     Findings: No bruising, erythema, lesion or rash.  Neurological:     General: No focal deficit present.     Mental Status: He is alert and oriented to person, place, and time. Mental status is at baseline.  Psychiatric:        Mood and Affect: Mood normal.        Behavior: Behavior normal.        Thought Content: Thought content normal.        Judgment: Judgment normal.     Results for orders placed or performed in visit on 08/13/24  Microalbumin, Urine Waived   Collection Time: 08/13/24  9:48 AM  Result Value Ref Range   Microalb, Ur Waived 30 (H) 0 - 19 mg/L   Creatinine, Urine Waived 100 10 - 300 mg/dL   Microalb/Creat Ratio <30 <30 mg/g  Bayer DCA Hb A1c Waived   Collection Time: 08/13/24  9:48 AM  Result Value Ref Range   HB A1C (BAYER DCA - WAIVED) 6.1 (H) 4.8 - 5.6 %  Comprehensive metabolic panel with GFR   Collection Time: 08/13/24  9:49 AM  Result Value Ref Range   Glucose 103 (H) 70 - 99 mg/dL   BUN 23 6 - 24 mg/dL   Creatinine, Ser 8.89 0.76 - 1.27 mg/dL   eGFR 80 >40 fO/fpw/8.26   BUN/Creatinine Ratio 21 (H) 9 - 20   Sodium 137 134 - 144 mmol/L   Potassium 5.0 3.5 - 5.2 mmol/L   Chloride 102 96 - 106 mmol/L   CO2 20 20 - 29 mmol/L   Calcium  9.4 8.7 - 10.2 mg/dL   Total Protein 6.6 6.0 - 8.5 g/dL   Albumin 4.2 3.8 - 4.9 g/dL   Globulin, Total 2.4 1.5 - 4.5 g/dL   Bilirubin Total 0.4 0.0 - 1.2 mg/dL   Alkaline Phosphatase 84 44 - 121 IU/L   AST 18 0 - 40 IU/L   ALT 18 0 - 44 IU/L  CBC with Differential/Platelet   Collection Time: 08/13/24  9:49 AM  Result Value Ref Range   WBC 8.8 3.4 - 10.8 x10E3/uL   RBC 4.64 4.14 - 5.80 x10E6/uL    Hemoglobin 14.0 13.0 - 17.7 g/dL   Hematocrit 57.0 62.4 - 51.0 %   MCV 93 79 - 97 fL   MCH 30.2 26.6 - 33.0 pg   MCHC 32.6 31.5 - 35.7 g/dL  RDW 13.7 11.6 - 15.4 %   Platelets 239 150 - 450 x10E3/uL   Neutrophils 69 Not Estab. %   Lymphs 21 Not Estab. %   Monocytes 7 Not Estab. %   Eos 1 Not Estab. %   Basos 1 Not Estab. %   Neutrophils Absolute 6.2 1.4 - 7.0 x10E3/uL   Lymphocytes Absolute 1.9 0.7 - 3.1 x10E3/uL   Monocytes Absolute 0.6 0.1 - 0.9 x10E3/uL   EOS (ABSOLUTE) 0.1 0.0 - 0.4 x10E3/uL   Basophils Absolute 0.0 0.0 - 0.2 x10E3/uL   Immature Granulocytes 0 Not Estab. %   Immature Grans (Abs) 0.0 0.0 - 0.1 x10E3/uL  Lipid Panel w/o Chol/HDL Ratio   Collection Time: 08/13/24  9:49 AM  Result Value Ref Range   Cholesterol, Total 155 100 - 199 mg/dL   Triglycerides 809 (H) 0 - 149 mg/dL   HDL 42 >60 mg/dL   VLDL Cholesterol Cal 32 5 - 40 mg/dL   LDL Chol Calc (NIH) 81 0 - 99 mg/dL  PSA   Collection Time: 08/13/24  9:49 AM  Result Value Ref Range   Prostate Specific Ag, Serum 0.5 0.0 - 4.0 ng/mL  TSH   Collection Time: 08/13/24  9:49 AM  Result Value Ref Range   TSH 2.650 0.450 - 4.500 uIU/mL  Uric acid   Collection Time: 08/13/24  9:49 AM  Result Value Ref Range   Uric Acid 4.2 3.8 - 8.4 mg/dL  Hepatitis B surface antibody,quantitative   Collection Time: 08/13/24  9:49 AM  Result Value Ref Range   Hepatitis B Surf Ab Quant <3.5 (L) Immunity>10 mIU/mL   *Note: Due to a large number of results and/or encounters for the requested time period, some results have not been displayed. A complete set of results can be found in Results Review.      Assessment & Plan:   Problem List Items Addressed This Visit   None Visit Diagnoses       Upper respiratory tract infection, unspecified type    -  Primary   Covid and Flu negative. Will treat with burst of prednisone . Call with any concerns or if not getting better.        Follow up plan: Return for As  scheduled.

## 2024-09-28 ENCOUNTER — Ambulatory Visit: Payer: MEDICAID | Admitting: Dermatology

## 2024-10-03 ENCOUNTER — Other Ambulatory Visit: Payer: Self-pay | Admitting: Family Medicine

## 2024-10-05 NOTE — Telephone Encounter (Signed)
 Requested Prescriptions  Pending Prescriptions Disp Refills   cetirizine  (ZYRTEC ) 10 MG tablet [Pharmacy Med Name: Cetirizine  HCl 10 MG Oral Tablet] 90 tablet 0    Sig: Take 1 tablet by mouth once daily     Ear, Nose, and Throat:  Antihistamines 2 Passed - 10/05/2024  1:53 PM      Passed - Cr in normal range and within 360 days    Creatinine  Date Value Ref Range Status  11/30/2014 1.24 0.60 - 1.30 mg/dL Final   Creatinine, Ser  Date Value Ref Range Status  08/13/2024 1.10 0.76 - 1.27 mg/dL Final         Passed - Valid encounter within last 12 months    Recent Outpatient Visits           1 week ago Upper respiratory tract infection, unspecified type   [facility] [place], Megan P, DO   1 month ago Routine general medical examination at a health care facility   [facility] [place], [place] P, DO   4 months ago Folliculitis   [facility] [place], Megan P, DO   7 months ago Left arm pain   [facility] [place], Megan P, DO   8 months ago Chronic gout of multiple sites, unspecified cause   [facility] Vicci Duwaine SQUIBB, DO       Future Appointments             In 1 week Raymund, Lauraine BROCKS, MD [facility]   In 1 month Hester Alm BROCKS, MD [facility]

## 2024-10-07 ENCOUNTER — Ambulatory Visit: Payer: MEDICAID

## 2024-10-08 ENCOUNTER — Encounter: Payer: Self-pay | Admitting: Family Medicine

## 2024-10-13 ENCOUNTER — Encounter: Payer: Self-pay | Admitting: Dermatology

## 2024-10-13 ENCOUNTER — Ambulatory Visit: Payer: MEDICAID

## 2024-10-13 DIAGNOSIS — L209 Atopic dermatitis, unspecified: Secondary | ICD-10-CM | POA: Diagnosis not present

## 2024-10-13 NOTE — Progress Notes (Signed)
 Patient here today for Nemluvio  injection.    Nemluvio  30mg /mL injected SQ into the right  upper arm. Patient tolerated injection well.    NDC: 9700-3779-84 Lot: [id number] Exp: 11/30/2026   Alan Pizza RMA

## 2024-10-14 ENCOUNTER — Other Ambulatory Visit: Payer: Self-pay | Admitting: Dermatology

## 2024-10-15 ENCOUNTER — Encounter: Payer: Self-pay | Admitting: Dermatology

## 2024-10-15 ENCOUNTER — Ambulatory Visit: Payer: MEDICAID

## 2024-10-15 DIAGNOSIS — Z23 Encounter for immunization: Secondary | ICD-10-CM | POA: Diagnosis not present

## 2024-10-16 ENCOUNTER — Encounter: Payer: Self-pay | Admitting: Family Medicine

## 2024-10-18 ENCOUNTER — Encounter: Payer: Self-pay | Admitting: Dermatology

## 2024-10-18 DIAGNOSIS — L209 Atopic dermatitis, unspecified: Secondary | ICD-10-CM

## 2024-10-19 ENCOUNTER — Encounter: Payer: Self-pay | Admitting: Dermatology

## 2024-10-19 MED ORDER — TACROLIMUS 0.1 % EX OINT: TOPICAL_OINTMENT | Freq: Two times a day (BID) | CUTANEOUS | 6 refills | Status: DC

## 2024-10-22 ENCOUNTER — Encounter: Payer: Self-pay | Admitting: Family Medicine

## 2024-10-23 ENCOUNTER — Encounter: Payer: Self-pay | Admitting: Family Medicine

## 2024-10-24 ENCOUNTER — Encounter: Payer: Self-pay | Admitting: Family Medicine

## 2024-10-24 ENCOUNTER — Encounter: Payer: Self-pay | Admitting: Dermatology

## 2024-10-25 ENCOUNTER — Encounter: Payer: Self-pay | Admitting: Family Medicine

## 2024-10-26 ENCOUNTER — Ambulatory Visit: Payer: Self-pay | Admitting: Family Medicine

## 2024-10-26 ENCOUNTER — Encounter: Payer: Self-pay | Admitting: Family Medicine

## 2024-10-26 NOTE — Telephone Encounter (Signed)
 Called patient and left a message for him to call back to get scheduled or he can schedule an acute visit via MyChart.

## 2024-10-26 NOTE — Telephone Encounter (Signed)
 Appt with us  or dermatology

## 2024-10-26 NOTE — Telephone Encounter (Signed)
 FYI Only or Action Required?: FYI only for provider.  Patient was last seen in primary care on 09/23/2024 by Vicci Duwaine SQUIBB, DO.  Called Nurse Triage reporting Insect Bite.  Symptoms began several days ago.   Triage Disposition: See Physician Within 24 Hours  Patient/caregiver understands and will follow disposition?: Yes     Copied from CRM [id number]. Topic: Clinical - Red Word Triage >> Oct 26, 2024  2:25 PM Shanda MATSU wrote: Red Word that prompted transfer to Nurse Triage: Patient is reporting a spider bite on left leg, area of bite is swollen. Reason for Disposition  [1] Red or very tender (to touch) area AND [2] started over 24 hours after the bite  Answer Assessment - Initial Assessment Questions Pt is going to urgent care today as no appointment availability in office.  Left leg spider bite (not sure if it was a spider) Onset: Sat night Red and swollen    REDNESS: Is the area red or pink? If Yes, ask: What size is the area of redness? (inches or cm). When did the redness start?     Not sure  PAIN: Is there any pain? If Yes, ask: How bad is the pain? (Scale 0-10; or none, mild, moderate, severe)     Itching mostly  ITCHING: Does it itch? If Yes, ask: How bad is the itch?      Yes  SWELLING: How big is the swelling? (e.g., inches, cm, or compare to coins)     Not sure  OTHER SYMPTOMS: Do you have any other symptoms?  (e.g., difficulty breathing, fever, hives)     Denies  Protocols used: Insect Bite-A-AH

## 2024-10-27 ENCOUNTER — Ambulatory Visit: Payer: MEDICAID | Admitting: Pediatrics

## 2024-10-27 NOTE — Telephone Encounter (Signed)
 Noted patient seen at urgent care

## 2024-10-27 NOTE — Telephone Encounter (Signed)
 Scheduled

## 2024-10-28 ENCOUNTER — Ambulatory Visit: Payer: MEDICAID | Admitting: Pediatrics

## 2024-10-28 ENCOUNTER — Ambulatory Visit (INDEPENDENT_AMBULATORY_CARE_PROVIDER_SITE_OTHER): Payer: MEDICAID

## 2024-10-28 ENCOUNTER — Encounter: Payer: Self-pay | Admitting: Family Medicine

## 2024-10-28 DIAGNOSIS — L0291 Cutaneous abscess, unspecified: Secondary | ICD-10-CM

## 2024-10-28 DIAGNOSIS — L280 Lichen simplex chronicus: Secondary | ICD-10-CM

## 2024-10-28 DIAGNOSIS — L2084 Intrinsic (allergic) eczema: Secondary | ICD-10-CM

## 2024-10-28 MED ORDER — CLOBETASOL PROPIONATE 0.05 % EX SHAM: MEDICATED_SHAMPOO | CUTANEOUS | 5 refills | Status: AC

## 2024-10-28 MED ORDER — CLOBETASOL PROPIONATE 0.05 % EX OINT: TOPICAL_OINTMENT | CUTANEOUS | 5 refills | Status: AC

## 2024-10-28 MED ORDER — MUPIROCIN 2 % EX OINT: TOPICAL_OINTMENT | CUTANEOUS | 1 refills | Status: DC

## 2024-10-28 NOTE — Progress Notes (Signed)
    Subjective   Jesus Guerrero is a 53 y.o. male who presents for the following: Scalp issues. Patient is established patient .  Today patient reports: Scalp itchy and irritated, using zoryve  0.15% cream, lidocaine , and ketoconazole  2% shampoo and not helping anymore.   Review of Systems:    No other skin or systemic complaints except as noted in HPI or Assessment and Plan.  The following portions of the chart were reviewed this encounter and updated as appropriate: medications, allergies, medical history  Relevant Medical History:  n/a   Objective  Well appearing patient in no apparent distress; mood and affect are within normal limits. Examination was performed of the: Focused Exam of: Scalp, chest, left lower leg.    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 Let sit for 15 minutes. Then add water, lather, and rinse out  Discussed side effect of super potent topical steroids including atrophy, dyspigmentation, striae, telangectasia, folliculitis, loss of skin pigment, hair growth, tachyphylaxis, risk of systemic absorption with missuse. -Continue Nemluvio  injections  - SUBQ: 60 mg followed by 30 mg every 4 weeks. - General gentle skin care was discussed including: at least twice daily use of a greasy emollient (recommended cerave cream, vanicream, vaseline, aquaphor ointment), taking warm showers once daily lasting 10 minutes  or less, using a mild soap (recommended Dove), immediate application of emollient after exiting bath/shower and avoiding skin care products that contain fragrances  Subcutaneous cyst vs folliculitis - abdomen  - Benign, patient reassured - Start mupirocin  2% ointment BID to crusted areas until clear    Level of service outlined above   Procedures, orders, diagnosis for this visit:  ABSCESS   LICHEN SIMPLEX CHRONICUS    Abscess  Lichen simplex chronicus  Other orders -     Clobetasol  Propionate; Apply 1 gram topically to affected area of skin twice daily. Stop once resolved and restart as needed for flares. Avoid use on face, armpits, groin unless otherwise indicated.  Dispense: 60 g; Refill: 5 -     Clobetasol  Propionate; Apply 4 mL of medication to affected areas of scalp. Let sit for 15 minutes. Then add water, lather, and rinse out  Dispense: 120 mL; Refill: 5 -     Mupirocin ; Apply to affected area of skin three times daily until healed  Dispense: 30 g; Refill: 1    Return to clinic: Return for As scheduled, w/ Dr. Hester.  I, Jacquelynn V. Wilfred, CMA, am acting as scribe for Lauraine JAYSON Kanaris, MD.  Documentation: I have reviewed the above documentation for accuracy and completeness, and I agree with the above.  Lauraine JAYSON Kanaris, MD

## 2024-10-28 NOTE — Patient Instructions (Signed)

## 2024-10-29 ENCOUNTER — Encounter: Payer: Self-pay | Admitting: Dermatology

## 2024-10-29 ENCOUNTER — Ambulatory Visit: Payer: MEDICAID | Admitting: Podiatry

## 2024-10-30 ENCOUNTER — Encounter: Payer: Self-pay | Admitting: Dermatology

## 2024-11-05 ENCOUNTER — Encounter: Payer: Self-pay | Admitting: Dermatology

## 2024-11-06 ENCOUNTER — Encounter: Payer: Self-pay | Admitting: Family Medicine

## 2024-11-12 ENCOUNTER — Encounter: Payer: Self-pay | Admitting: Dermatology

## 2024-11-16 ENCOUNTER — Encounter: Payer: Self-pay | Admitting: Family Medicine

## 2024-11-16 ENCOUNTER — Ambulatory Visit (INDEPENDENT_AMBULATORY_CARE_PROVIDER_SITE_OTHER): Payer: MEDICAID | Admitting: Family Medicine

## 2024-11-16 VITALS — BP 150/90 | HR 92 | Temp 97.7°F | Ht 64.0 in | Wt 205.0 lb

## 2024-11-16 DIAGNOSIS — K219 Gastro-esophageal reflux disease without esophagitis: Secondary | ICD-10-CM

## 2024-11-16 DIAGNOSIS — Z23 Encounter for immunization: Secondary | ICD-10-CM | POA: Diagnosis not present

## 2024-11-16 DIAGNOSIS — I10 Essential (primary) hypertension: Secondary | ICD-10-CM | POA: Diagnosis not present

## 2024-11-16 MED ORDER — LISINOPRIL 20 MG PO TABS: 20.0000 mg | ORAL_TABLET | Freq: Every day | ORAL | 0 refills | Status: AC

## 2024-11-16 MED ORDER — HYDROXYZINE HCL 10 MG PO TABS: ORAL_TABLET | ORAL | 1 refills | Status: AC

## 2024-11-16 MED ORDER — CYCLOBENZAPRINE HCL 10 MG PO TABS: 10.0000 mg | ORAL_TABLET | Freq: Every day | ORAL | 0 refills | Status: AC

## 2024-11-16 MED ORDER — OMEPRAZOLE 40 MG PO CPDR: 40.0000 mg | DELAYED_RELEASE_CAPSULE | Freq: Every day | ORAL | 1 refills | Status: AC

## 2024-11-16 MED ORDER — ATORVASTATIN CALCIUM 20 MG PO TABS: 20.0000 mg | ORAL_TABLET | Freq: Every day | ORAL | 1 refills | Status: AC

## 2024-11-16 MED ORDER — NAPROXEN 500 MG PO TABS: ORAL_TABLET | ORAL | 1 refills | Status: AC

## 2024-11-16 MED ORDER — LISINOPRIL 10 MG PO TABS: 10.0000 mg | ORAL_TABLET | Freq: Every day | ORAL | 1 refills | Status: DC

## 2024-11-16 MED ORDER — CETIRIZINE HCL 10 MG PO TABS: 10.0000 mg | ORAL_TABLET | Freq: Every day | ORAL | 0 refills | Status: DC

## 2024-11-16 MED ORDER — MONTELUKAST SODIUM 10 MG PO TABS
10.0000 mg | ORAL_TABLET | Freq: Every day | ORAL | 2 refills | Status: AC
Start: 2024-11-16 — End: ?

## 2024-11-16 MED ORDER — ALLOPURINOL 300 MG PO TABS: 300.0000 mg | ORAL_TABLET | Freq: Every day | ORAL | 1 refills | Status: AC

## 2024-11-16 NOTE — Progress Notes (Signed)
 BP (!) 150/90   Pulse 92   Temp 97.7 F (36.5 C) (Oral)   Ht 5' 4 (1.626 m)   Wt 205 lb (93 kg)   SpO2 95%   BMI 35.19 kg/m    Subjective:    Patient ID: Jesus Guerrero, male    DOB: 08-12-1971, 53 y.o.   MRN: [id number]  HPI: ALEXY HELDT is a 53 y.o. male  Chief Complaint  Patient presents with   Gastroesophageal Reflux   Hypertension   GERD GERD control status: uncontrolled Satisfied with current treatment? no Heartburn frequency: daily Medication side effects: no  Medication compliance: excellent Previous GERD medications: omeprazole  Dysphagia: no Odynophagia:  no Hematemesis: no Blood in stool: no EGD: no  HYPERTENSION  Hypertension status: uncontrolled  Satisfied with current treatment? no Duration of hypertension: chronic BP monitoring frequency:  not checking BP medication side effects:  no Medication compliance: excellent compliance Previous BP meds: lisinopril  Aspirin: no Recurrent headaches: no Visual changes: no Palpitations: no Dyspnea: no Chest pain: no Lower extremity edema: no Dizzy/lightheaded: no  Relevant past medical, surgical, family and social history reviewed and updated as indicated. Interim medical history since our last visit reviewed. Allergies and medications reviewed and updated.  Review of Systems  Constitutional: Negative.   Respiratory: Negative.    Cardiovascular: Negative.   Musculoskeletal: Negative.   Neurological: Negative.   Psychiatric/Behavioral: Negative.      Per HPI unless specifically indicated above     Objective:    BP (!) 150/90   Pulse 92   Temp 97.7 F (36.5 C) (Oral)   Ht 5' 4 (1.626 m)   Wt 205 lb (93 kg)   SpO2 95%   BMI 35.19 kg/m   Wt Readings from Last 3 Encounters:  11/16/24 205 lb (93 kg)  09/23/24 195 lb 6.4 oz (88.6 kg)  08/20/24 193 lb 3.2 oz (87.6 kg)    Physical Exam Vitals and nursing note reviewed.  Constitutional:      General: He is not in acute  distress.    Appearance: Normal appearance. He is not ill-appearing, toxic-appearing or diaphoretic.  HENT:     Head: Normocephalic and atraumatic.     Right Ear: External ear normal.     Left Ear: External ear normal.     Nose: Nose normal.     Mouth/Throat:     Mouth: Mucous membranes are moist.     Pharynx: Oropharynx is clear.  Eyes:     General: No scleral icterus.       Right eye: No discharge.        Left eye: No discharge.     Extraocular Movements: Extraocular movements intact.     Conjunctiva/sclera: Conjunctivae normal.     Pupils: Pupils are equal, round, and reactive to light.  Cardiovascular:     Rate and Rhythm: Normal rate and regular rhythm.     Pulses: Normal pulses.     Heart sounds: Normal heart sounds. No murmur heard.    No friction rub. No gallop.  Pulmonary:     Effort: Pulmonary effort is normal. No respiratory distress.     Breath sounds: Normal breath sounds. No stridor. No wheezing, rhonchi or rales.  Chest:     Chest wall: No tenderness.  Musculoskeletal:        General: Normal range of motion.     Cervical back: Normal range of motion and neck supple.  Skin:    General: Skin is warm and  dry.     Capillary Refill: Capillary refill takes less than 2 seconds.     Coloration: Skin is not jaundiced or pale.     Findings: No bruising, erythema, lesion or rash.  Neurological:     General: No focal deficit present.     Mental Status: He is alert and oriented to person, place, and time. Mental status is at baseline.  Psychiatric:        Mood and Affect: Mood normal.        Behavior: Behavior normal.        Thought Content: Thought content normal.        Judgment: Judgment normal.     Results for orders placed or performed in visit on 08/13/24  Microalbumin, Urine Waived   Collection Time: 08/13/24  9:48 AM  Result Value Ref Range   Microalb, Ur Waived 30 (H) 0 - 19 mg/L   Creatinine, Urine Waived 100 10 - 300 mg/dL   Microalb/Creat Ratio <30  <30 mg/g  Bayer DCA Hb A1c Waived   Collection Time: 08/13/24  9:48 AM  Result Value Ref Range   HB A1C (BAYER DCA - WAIVED) 6.1 (H) 4.8 - 5.6 %  Comprehensive metabolic panel with GFR   Collection Time: 08/13/24  9:49 AM  Result Value Ref Range   Glucose 103 (H) 70 - 99 mg/dL   BUN 23 6 - 24 mg/dL   Creatinine, Ser 8.89 0.76 - 1.27 mg/dL   eGFR 80 >40 fO/fpw/8.26   BUN/Creatinine Ratio 21 (H) 9 - 20   Sodium 137 134 - 144 mmol/L   Potassium 5.0 3.5 - 5.2 mmol/L   Chloride 102 96 - 106 mmol/L   CO2 20 20 - 29 mmol/L   Calcium  9.4 8.7 - 10.2 mg/dL   Total Protein 6.6 6.0 - 8.5 g/dL   Albumin 4.2 3.8 - 4.9 g/dL   Globulin, Total 2.4 1.5 - 4.5 g/dL   Bilirubin Total 0.4 0.0 - 1.2 mg/dL   Alkaline Phosphatase 84 44 - 121 IU/L   AST 18 0 - 40 IU/L   ALT 18 0 - 44 IU/L  CBC with Differential/Platelet   Collection Time: 08/13/24  9:49 AM  Result Value Ref Range   WBC 8.8 3.4 - 10.8 x10E3/uL   RBC 4.64 4.14 - 5.80 x10E6/uL   Hemoglobin 14.0 13.0 - 17.7 g/dL   Hematocrit 57.0 62.4 - 51.0 %   MCV 93 79 - 97 fL   MCH 30.2 26.6 - 33.0 pg   MCHC 32.6 31.5 - 35.7 g/dL   RDW 86.2 88.3 - 84.5 %   Platelets 239 150 - 450 x10E3/uL   Neutrophils 69 Not Estab. %   Lymphs 21 Not Estab. %   Monocytes 7 Not Estab. %   Eos 1 Not Estab. %   Basos 1 Not Estab. %   Neutrophils Absolute 6.2 1.4 - 7.0 x10E3/uL   Lymphocytes Absolute 1.9 0.7 - 3.1 x10E3/uL   Monocytes Absolute 0.6 0.1 - 0.9 x10E3/uL   EOS (ABSOLUTE) 0.1 0.0 - 0.4 x10E3/uL   Basophils Absolute 0.0 0.0 - 0.2 x10E3/uL   Immature Granulocytes 0 Not Estab. %   Immature Grans (Abs) 0.0 0.0 - 0.1 x10E3/uL  Lipid Panel w/o Chol/HDL Ratio   Collection Time: 08/13/24  9:49 AM  Result Value Ref Range   Cholesterol, Total 155 100 - 199 mg/dL   Triglycerides 809 (H) 0 - 149 mg/dL   HDL 42 >60 mg/dL   VLDL  Cholesterol Cal 32 5 - 40 mg/dL   LDL Chol Calc (NIH) 81 0 - 99 mg/dL  PSA   Collection Time: 08/13/24  9:49 AM  Result Value Ref  Range   Prostate Specific Ag, Serum 0.5 0.0 - 4.0 ng/mL  TSH   Collection Time: 08/13/24  9:49 AM  Result Value Ref Range   TSH 2.650 0.450 - 4.500 uIU/mL  Uric acid   Collection Time: 08/13/24  9:49 AM  Result Value Ref Range   Uric Acid 4.2 3.8 - 8.4 mg/dL  Hepatitis B surface antibody,quantitative   Collection Time: 08/13/24  9:49 AM  Result Value Ref Range   Hepatitis B Surf Ab Quant <3.5 (L) Immunity>10 mIU/mL   *Note: Due to a large number of results and/or encounters for the requested time period, some results have not been displayed. A complete set of results can be found in Results Review.      Assessment & Plan:   Problem List Items Addressed This Visit       Cardiovascular and Mediastinum   Primary hypertension - Primary   Running high. Will increase his lisinopril  to 20mg  and recheck in 6 weeks. Call with any concerns.       Relevant Medications   atorvastatin  (LIPITOR) 20 MG tablet   lisinopril  (ZESTRIL ) 20 MG tablet     Digestive   GERD (gastroesophageal reflux disease)   Not under good control. Will increase his omeprazole  to 40mg  and recheck in 6 weeks. Call with any concerns.       Relevant Medications   omeprazole  (PRILOSEC) 40 MG capsule   Other Visit Diagnoses       Need for hepatitis vaccination       Hep B #2 given today.   Relevant Orders   Heplisav-B (HepB-CPG) Vaccine        Follow up plan: Return in about 6 weeks (around 12/28/2024).

## 2024-11-16 NOTE — Assessment & Plan Note (Signed)
 Running high. Will increase his lisinopril  to 20mg  and recheck in 6 weeks. Call with any concerns.

## 2024-11-16 NOTE — Assessment & Plan Note (Signed)
 Not under good control. Will increase his omeprazole  to 40mg  and recheck in 6 weeks. Call with any concerns.

## 2024-11-17 ENCOUNTER — Ambulatory Visit (INDEPENDENT_AMBULATORY_CARE_PROVIDER_SITE_OTHER): Payer: MEDICAID | Admitting: Dermatology

## 2024-11-17 ENCOUNTER — Encounter: Payer: Self-pay | Admitting: Family Medicine

## 2024-11-17 DIAGNOSIS — D229 Melanocytic nevi, unspecified: Secondary | ICD-10-CM

## 2024-11-17 DIAGNOSIS — L578 Other skin changes due to chronic exposure to nonionizing radiation: Secondary | ICD-10-CM

## 2024-11-17 DIAGNOSIS — Z1283 Encounter for screening for malignant neoplasm of skin: Secondary | ICD-10-CM | POA: Diagnosis not present

## 2024-11-17 DIAGNOSIS — L281 Prurigo nodularis: Secondary | ICD-10-CM

## 2024-11-17 DIAGNOSIS — L209 Atopic dermatitis, unspecified: Secondary | ICD-10-CM | POA: Diagnosis not present

## 2024-11-17 DIAGNOSIS — L814 Other melanin hyperpigmentation: Secondary | ICD-10-CM

## 2024-11-17 DIAGNOSIS — L821 Other seborrheic keratosis: Secondary | ICD-10-CM

## 2024-11-17 DIAGNOSIS — L2081 Atopic neurodermatitis: Secondary | ICD-10-CM

## 2024-11-17 DIAGNOSIS — W908XXA Exposure to other nonionizing radiation, initial encounter: Secondary | ICD-10-CM

## 2024-11-17 DIAGNOSIS — Z7189 Other specified counseling: Secondary | ICD-10-CM

## 2024-11-17 DIAGNOSIS — Z86018 Personal history of other benign neoplasm: Secondary | ICD-10-CM

## 2024-11-17 DIAGNOSIS — Z79899 Other long term (current) drug therapy: Secondary | ICD-10-CM

## 2024-11-17 MED ORDER — NEMOLIZUMAB-ILTO 30 MG ~~LOC~~ AUIJ
30.0000 mg | AUTO-INJECTOR | Freq: Once | SUBCUTANEOUS | Status: AC
  Administered 2024-11-17: 30 mg via SUBCUTANEOUS

## 2024-11-17 MED ORDER — NEMLUVIO 30 MG ~~LOC~~ AUIJ: 60.0000 mg | AUTO-INJECTOR | SUBCUTANEOUS | 5 refills | Status: DC

## 2024-11-17 MED ORDER — MUPIROCIN 2 % EX OINT: TOPICAL_OINTMENT | CUTANEOUS | 6 refills | Status: AC

## 2024-11-17 MED ORDER — TACROLIMUS 0.1 % EX OINT: TOPICAL_OINTMENT | Freq: Two times a day (BID) | CUTANEOUS | 6 refills | Status: AC

## 2024-11-17 MED ORDER — ZORYVE 0.15 % EX CREA: 1.0000 | TOPICAL_CREAM | Freq: Every day | CUTANEOUS | 5 refills | Status: AC

## 2024-11-17 NOTE — Progress Notes (Unsigned)
 Follow-Up Visit   Subjective  Jesus Guerrero is a 53 y.o. male who presents for the following: Skin Cancer Screening and Upper Body Skin Exam Hx of dysplastic nevi Hx of atopic dermatitis doing well on numluveio injections   The patient presents for Upper Body Skin Exam (UBSE) for skin cancer screening and mole check. The patient has spots, moles and lesions to be evaluated, some may be new or changing and the patient may have concern these could be cancer.  The following portions of the chart were reviewed this encounter and updated as appropriate: medications, allergies, medical history  Review of Systems:  No other skin or systemic complaints except as noted in HPI or Assessment and Plan.  Objective  Well appearing patient in no apparent distress; mood and affect are within normal limits.  All skin waist up examined. Relevant physical exam findings are noted in the Assessment and Plan.    Today's scalp photos atopic dermatitis   Today's scalp photos atopic dermatitis     Assessment & Plan   HISTORY OF DYSPLASTIC NEVUS 08/13/2019 left calf  moderate, left buttock mild  07/16/2018 left helix - moderate  No evidence of recurrence today Recommend regular full body skin exams Recommend daily broad spectrum sunscreen SPF 30+ to sun-exposed areas, reapply every 2 hours as needed.  Call if any new or changing lesions are noted between office visits  Skin cancer screening performed today.  Actinic Damage - Chronic condition, secondary to cumulative UV/sun exposure - diffuse scaly erythematous macules with underlying dyspigmentation - Recommend daily broad spectrum sunscreen SPF 30+ to sun-exposed areas, reapply every 2 hours as needed.  - Staying in the shade or wearing long sleeves, sun glasses (UVA+UVB protection) and wide brim hats (4-inch brim around the entire circumference of the hat) are also recommended for sun protection.  - Call for new or changing  lesions.  Lentigines, Seborrheic Keratoses, Hemangiomas - Benign normal skin lesions - Benign-appearing - Call for any changes  Melanocytic Nevi - Tan-brown and/or pink-flesh-colored symmetric macules and papules - Benign appearing on exam today - Observation - Call clinic for new or changing moles - Recommend daily use of broad spectrum spf 30+ sunscreen to sun-exposed areas.   ATOPIC DERMATITIS and ,prurigo nodularis  improving with nemluvio   Scalp has improved compared photo from 05/20/2024 and photos today on scalp  Exam: No active areas on trunk  Face - 3 small excoriations on forehead see photos from today 1% BSA improved from 6% on 05/20/24 Chronic and persistent condition with duration or expected duration over one year. Condition is symptomatic/ bothersome to patient. Not currently at goal. Atopic dermatitis (eczema) is a chronic, relapsing, pruritic condition that can significantly affect quality of life. It is often associated with allergic rhinitis and/or asthma and can require treatment with topical medications, phototherapy, or in severe cases biologic injectable medication (Dupixent ; Adbry ) or Oral JAK inhibitors.  Treatment Plan: Patient injected today with Nemluvio  30mg /ml sq into left upper arm. Patient tolerated well with no adverse reactions.  NDC 9700-3779-84  Lot [id number] Exp 01/2027  Continue Nemluvio  30mg /mL SQ Q4W.   Continue Protopic  0.1 % ointment and Zoryve  cream BID to aa's PRN.  Use on any active areas    Recommend gentle skin care.   Return for 4 week nurse visit at [facility], 6 month ubse .  LILLETTE Eleanor Blush, CMA, am acting as scribe for Alm Rhyme, MD.   Documentation: I have reviewed the above documentation for  accuracy and completeness, and I agree with the above.  Alm Rhyme, MD

## 2024-11-17 NOTE — Patient Instructions (Addendum)

## 2024-11-18 ENCOUNTER — Encounter: Payer: Self-pay | Admitting: Dermatology

## 2024-11-18 NOTE — Telephone Encounter (Signed)
 yes

## 2024-11-20 ENCOUNTER — Encounter: Payer: Self-pay | Admitting: Family Medicine

## 2024-11-23 ENCOUNTER — Encounter: Payer: Self-pay | Admitting: Family Medicine

## 2024-11-24 ENCOUNTER — Other Ambulatory Visit: Payer: Self-pay

## 2024-11-24 DIAGNOSIS — L209 Atopic dermatitis, unspecified: Secondary | ICD-10-CM

## 2024-11-24 MED ORDER — NEMLUVIO 30 MG ~~LOC~~ AUIJ: AUTO-INJECTOR | SUBCUTANEOUS | 5 refills | Status: AC

## 2024-11-27 ENCOUNTER — Encounter: Payer: Self-pay | Admitting: Family Medicine

## 2024-11-28 ENCOUNTER — Encounter: Payer: Self-pay | Admitting: Family Medicine

## 2024-11-30 ENCOUNTER — Encounter: Payer: Self-pay | Admitting: Family Medicine

## 2024-11-30 ENCOUNTER — Ambulatory Visit: Payer: Self-pay

## 2024-11-30 NOTE — Telephone Encounter (Signed)
 FYI Only or Action Required?: FYI only for provider: utilizing [facility] today.  Patient was last seen in primary care on 11/16/2024 by Vicci Duwaine SQUIBB, DO.  Called Nurse Triage reporting Cough.  Symptoms began several days ago.  Interventions attempted: Rest, hydration, or home remedies.  Symptoms are: gradually worsening.  Triage Disposition: See HCP Within 4 Hours (Or PCP Triage)  Patient/caregiver understands and will follow disposition?: Yes, will follow disposition  Copied from CRM #[id number]. Topic: Clinical - Red Word Triage >> Nov 30, 2024  8:33 AM Willma SAUNDERS wrote: Red Word that prompted transfer to Nurse Triage: Patient states productive cough with red mucous, congestion, and hoarse voice. Reason for Disposition  [1] Coughed up blood AND [2] > 1 tablespoon (15 ml)  (Exception: Blood-tinged sputum.)  Answer Assessment - Initial Assessment Questions 1. ONSET: When did the cough begin?      About 4 days 2. SEVERITY: How bad is the cough today?      moderate 3. SPUTUM: Describe the color of your sputum (e.g., none, dry cough; clear, white, yellow, green)     red 4. HEMOPTYSIS: Are you coughing up any blood? If Yes, ask: How much? (e.g., flecks, streaks, tablespoons, etc.)     Denies blood, states red mucus-started today, was green mucus prior to this 5. DIFFICULTY BREATHING: Are you having difficulty breathing? If Yes, ask: How bad is it? (e.g., mild, moderate, severe)      denies 6. FEVER: Do you have a fever? If Yes, ask: What is your temperature, how was it measured, and when did it start?     denies 7. CARDIAC HISTORY: Do you have any history of heart disease? (e.g., heart attack, congestive heart failure)      denies 8. LUNG HISTORY: Do you have any history of lung disease?  (e.g., pulmonary embolus, asthma, emphysema)     denies 9. PE RISK FACTORS: Do you have a history of blood clots? (or: recent major surgery, recent prolonged travel,  bedridden)     denies 10. OTHER SYMPTOMS: Do you have any other symptoms? (e.g., runny nose, wheezing, chest pain)       Hoarse, congestion 12. TRAVEL: Have you traveled out of the country in the last month? (e.g., travel history, exposures)       Denies  Pt agreeable to using UC.  Protocols used: Cough - Acute Productive-A-AH

## 2024-12-01 ENCOUNTER — Encounter: Payer: Self-pay | Admitting: Family Medicine

## 2024-12-02 ENCOUNTER — Telehealth: Payer: Self-pay | Admitting: Family Medicine

## 2024-12-02 NOTE — Telephone Encounter (Signed)
 Called to see if patient would like to come in today to see NP Delon Benders. Called patient and left a message to call back to get scheduled.

## 2024-12-05 ENCOUNTER — Encounter: Payer: Self-pay | Admitting: Family Medicine

## 2024-12-12 ENCOUNTER — Encounter: Payer: Self-pay | Admitting: Family Medicine

## 2024-12-14 ENCOUNTER — Encounter: Payer: Self-pay | Admitting: Family Medicine

## 2024-12-15 ENCOUNTER — Encounter: Payer: Self-pay | Admitting: Dermatology

## 2024-12-15 ENCOUNTER — Encounter: Payer: Self-pay | Admitting: Family Medicine

## 2024-12-15 ENCOUNTER — Ambulatory Visit: Payer: MEDICAID

## 2024-12-15 DIAGNOSIS — L209 Atopic dermatitis, unspecified: Secondary | ICD-10-CM

## 2024-12-15 MED ORDER — NEMOLIZUMAB-ILTO 30 MG ~~LOC~~ AUIJ
30.0000 mg | AUTO-INJECTOR | Freq: Once | SUBCUTANEOUS | Status: AC
  Administered 2024-12-15: 09:00:00 30 mg via SUBCUTANEOUS

## 2024-12-15 NOTE — Progress Notes (Addendum)
 Patient here today for Nemluvio  injection for atopic dermatitis.    Nemluvio  30mg /mL injected SQ into the right  upper arm. Patient tolerated injection well.    NDC: 9700-3779-84 Lot: [id number] Exp: 01/31/2027   Alan Pizza RMA

## 2024-12-16 ENCOUNTER — Encounter: Payer: Self-pay | Admitting: Family Medicine

## 2024-12-16 ENCOUNTER — Telehealth: Payer: MEDICAID | Admitting: Physician Assistant

## 2024-12-16 DIAGNOSIS — J069 Acute upper respiratory infection, unspecified: Secondary | ICD-10-CM

## 2024-12-16 NOTE — Telephone Encounter (Signed)
 appt

## 2024-12-17 ENCOUNTER — Encounter: Payer: Self-pay | Admitting: Family Medicine

## 2024-12-17 MED ORDER — PROMETHAZINE-DM 6.25-15 MG/5ML PO SYRP: 5.0000 mL | ORAL_SOLUTION | Freq: Four times a day (QID) | ORAL | 0 refills | Status: DC | PRN

## 2024-12-17 MED ORDER — IPRATROPIUM BROMIDE 0.03 % NA SOLN: 2.0000 | Freq: Two times a day (BID) | NASAL | 0 refills | Status: AC

## 2024-12-17 NOTE — Progress Notes (Signed)

## 2024-12-19 ENCOUNTER — Encounter: Payer: Self-pay | Admitting: Family Medicine

## 2024-12-21 ENCOUNTER — Encounter: Payer: Self-pay | Admitting: Family Medicine

## 2024-12-24 ENCOUNTER — Encounter: Payer: Self-pay | Admitting: Family Medicine

## 2024-12-24 ENCOUNTER — Encounter: Payer: Self-pay | Admitting: Dermatology

## 2024-12-25 ENCOUNTER — Encounter: Payer: Self-pay | Admitting: Family Medicine

## 2024-12-26 ENCOUNTER — Encounter: Payer: Self-pay | Admitting: Family Medicine

## 2024-12-26 ENCOUNTER — Telehealth: Payer: MEDICAID | Admitting: Nurse Practitioner

## 2024-12-26 DIAGNOSIS — J40 Bronchitis, not specified as acute or chronic: Secondary | ICD-10-CM | POA: Diagnosis not present

## 2024-12-26 MED ORDER — AZITHROMYCIN 250 MG PO TABS: ORAL_TABLET | ORAL | 0 refills | Status: AC

## 2024-12-26 MED ORDER — BENZONATATE 200 MG PO CAPS: 200.0000 mg | ORAL_CAPSULE | Freq: Two times a day (BID) | ORAL | 0 refills | Status: DC | PRN

## 2024-12-26 MED ORDER — PREDNISONE 20 MG PO TABS: 20.0000 mg | ORAL_TABLET | Freq: Every day | ORAL | 0 refills | Status: AC

## 2024-12-26 NOTE — Progress Notes (Signed)
 We are sorry that you are not feeling well.  Here is how we plan to help!  Based on your presentation I believe you most likely have A cough due to bacteria.  When patients have a fever and a productive cough with a change in color or increased sputum production, we are concerned about bacterial bronchitis.  If left untreated it can progress to pneumonia.  If your symptoms do not improve with your treatment plan it is important that you contact your provider.   I have prescribed Azithromyin 250 mg: two tablets now and then one tablet daily for 4 additonal days    In addition you may use A prescription cough medication called Tessalon  Perles 100mg . You may take 1-2 capsules every 8 hours as needed for your cough. As well as prednisone  which is a steroid that has been sent.   From your responses in the eVisit questionnaire you describe inflammation in the upper respiratory tract which is causing a significant cough.  This is commonly called Bronchitis and has four common causes:   Allergies Viral Infections Acid Reflux Bacterial Infection Allergies, viruses and acid reflux are treated by controlling symptoms or eliminating the cause. An example might be a cough caused by taking certain blood pressure medications. You stop the cough by changing the medication. Another example might be a cough caused by acid reflux. Controlling the reflux helps control the cough.  USE OF BRONCHODILATOR (RESCUE) INHALERS: There is a risk from using your bronchodilator too frequently.  The risk is that over-reliance on a medication which only relaxes the muscles surrounding the breathing tubes can reduce the effectiveness of medications prescribed to reduce swelling and congestion of the tubes themselves.  Although you feel brief relief from the bronchodilator inhaler, your asthma may actually be worsening with the tubes becoming more swollen and filled with mucus.  This can delay other crucial treatments, such as oral  steroid medications. If you need to use a bronchodilator inhaler daily, several times per day, you should discuss this with your provider.  There are probably better treatments that could be used to keep your asthma under control.     HOME CARE Only take medications as instructed by your medical team. Complete the entire course of an antibiotic. Drink plenty of fluids and get plenty of rest. Avoid close contacts especially the very young and the elderly Cover your mouth if you cough or cough into your sleeve. Always remember to wash your hands A steam or ultrasonic humidifier can help congestion.   GET HELP RIGHT AWAY IF: You develop worsening fever. You become short of breath You cough up blood. Your symptoms persist after you have completed your treatment plan MAKE SURE YOU  Understand these instructions. Will watch your condition. Will get help right away if you are not doing well or get worse.  Your e-visit answers were reviewed by a board certified advanced clinical practitioner to complete your personal care plan.  Depending on the condition, your plan could have included both over the counter or prescription medications. If there is a problem please reply  once you have received a response from your provider. Your safety is important to us .  If you have drug allergies check your prescription carefully.    You can use MyChart to ask questions about todays visit, request a non-urgent call back, or ask for a work or school excuse for 24 hours related to this e-Visit. If it has been greater than 24 hours you will  need to follow up with your provider, or enter a new e-Visit to address those concerns. You will get an e-mail in the next two days asking about your experience.  I hope that your e-visit has been valuable and will speed your recovery. Thank you for using e-visits.   I have spent 5 minutes in review of e-visit questionnaire, review and updating patient chart, medical  decision making and response to patient.   Nahuel Wilbert W Josaiah Muhammed, NP

## 2024-12-27 ENCOUNTER — Encounter: Payer: Self-pay | Admitting: Family Medicine

## 2024-12-28 ENCOUNTER — Ambulatory Visit: Payer: Self-pay

## 2024-12-28 NOTE — Telephone Encounter (Signed)
 Note in chart. Pt going to UC

## 2024-12-28 NOTE — Telephone Encounter (Signed)
 FYI Only or Action Required?: FYI only for provider: pt going to UC.  Patient was last seen in primary care on 11/16/2024 by Vicci Duwaine SQUIBB, DO.  Called Nurse Triage reporting Cough.  Symptoms began several weeks ago.  Interventions attempted: Nothing.  Symptoms are: gradually worsening.  Triage Disposition: See PCP When Office is Open (Within 3 Days)  Patient/caregiver understands and will follow disposition?: No, wishes to speak with PCP  Copied from CRM #[id number]. Topic: Clinical - Red Word Triage >> Dec 28, 2024  8:35 AM Gustabo D wrote: Coughing and congestion really bad no fever for it's been going on a month and a half. Pt sounds extremely bad. Reason for Disposition  Cough has been present for > 3 weeks  Answer Assessment - Initial Assessment Questions 1. ONSET: When did the cough begin?      1.5 months 2. SEVERITY: How bad is the cough today?      severe 3. SPUTUM: Describe the color of your sputum (e.g., none, dry cough; clear, white, yellow, green)     clear 4. HEMOPTYSIS: Are you coughing up any blood? If Yes, ask: How much? (e.g., flecks, streaks, tablespoons, etc.)     denies 5. DIFFICULTY BREATHING: Are you having difficulty breathing? If Yes, ask: How bad is it? (e.g., mild, moderate, severe)      denies 6. FEVER: Do you have a fever? If Yes, ask: What is your temperature, how was it measured, and when did it start?     99.7 yesterday 10. OTHER SYMPTOMS: Do you have any other symptoms? (e.g., runny nose, wheezing, chest pain)       Runny nose and sneezing  Denies lung hx. Pt did not want to schedule with another provider, only wants PCP. Advised that she does not have availability, pt states that he will be using UC.  Protocols used: Cough - Acute Productive-A-AH

## 2024-12-29 ENCOUNTER — Encounter: Payer: Self-pay | Admitting: Dermatology

## 2024-12-29 ENCOUNTER — Encounter: Payer: Self-pay | Admitting: Family Medicine

## 2025-01-01 ENCOUNTER — Encounter: Payer: Self-pay | Admitting: Family Medicine

## 2025-01-01 ENCOUNTER — Ambulatory Visit: Payer: MEDICAID | Admitting: Family Medicine

## 2025-01-01 VITALS — BP 150/70 | HR 98 | Temp 97.6°F | Ht 64.0 in | Wt 193.2 lb

## 2025-01-01 DIAGNOSIS — K219 Gastro-esophageal reflux disease without esophagitis: Secondary | ICD-10-CM

## 2025-01-01 DIAGNOSIS — Z23 Encounter for immunization: Secondary | ICD-10-CM | POA: Diagnosis not present

## 2025-01-01 DIAGNOSIS — I10 Essential (primary) hypertension: Secondary | ICD-10-CM

## 2025-01-01 DIAGNOSIS — R052 Subacute cough: Secondary | ICD-10-CM

## 2025-01-01 NOTE — Progress Notes (Signed)
 "  BP (!) 150/70   Pulse 98   Temp 97.6 F (36.4 C) (Oral)   Ht 5' 4 (1.626 m)   Wt 193 lb 3.2 oz (87.6 kg)   SpO2 98%   BMI 33.16 kg/m    Subjective:    Patient ID: Jesus Guerrero, male    DOB: 1971-06-25, 54 y.o.   MRN: [id number]  HPI: Jesus Guerrero is a 54 y.o. male  Chief Complaint  Patient presents with   Hypertension   Cough   HYPERTENSION  Hypertension status: better  Satisfied with current treatment? yes Duration of hypertension: chronic BP monitoring frequency:  rarely BP medication side effects:  no Medication compliance: good compliance Previous BP meds: lisinopril  Aspirin: no Recurrent headaches: no Visual changes: no Palpitations: no Dyspnea: no Chest pain: no Lower extremity edema: no Dizzy/lightheaded: no  GERD GERD control status: controlled Satisfied with current treatment? yes Heartburn frequency: rarely Medication side effects: no  Medication compliance: excellent Dysphagia: no Odynophagia:  no Hematemesis: no Blood in stool: no EGD: no  Has been sick with cough and congestion- currently on prednisone  and azithromycin  -still has to finish his course. Concerned about his cough.  Relevant past medical, surgical, family and social history reviewed and updated as indicated. Interim medical history since our last visit reviewed. Allergies and medications reviewed and updated.  Review of Systems  Constitutional: Negative.   HENT:  Positive for congestion and postnasal drip. Negative for dental problem, drooling, ear discharge, ear pain, facial swelling, hearing loss, mouth sores, nosebleeds, rhinorrhea, sinus pressure, sinus pain, sneezing, sore throat, tinnitus, trouble swallowing and voice change.   Respiratory:  Positive for cough. Negative for apnea, choking, chest tightness, shortness of breath, wheezing and stridor.   Cardiovascular: Negative.   Skin: Negative.   Neurological: Negative.   Psychiatric/Behavioral: Negative.       Per HPI unless specifically indicated above     Objective:    BP (!) 150/70   Pulse 98   Temp 97.6 F (36.4 C) (Oral)   Ht 5' 4 (1.626 m)   Wt 193 lb 3.2 oz (87.6 kg)   SpO2 98%   BMI 33.16 kg/m   Wt Readings from Last 3 Encounters:  01/01/25 193 lb 3.2 oz (87.6 kg)  11/16/24 205 lb (93 kg)  09/23/24 195 lb 6.4 oz (88.6 kg)    Physical Exam Vitals and nursing note reviewed.  Constitutional:      General: He is not in acute distress.    Appearance: Normal appearance. He is obese. He is not ill-appearing, toxic-appearing or diaphoretic.  HENT:     Head: Normocephalic and atraumatic.     Right Ear: External ear normal.     Left Ear: External ear normal.     Nose: Nose normal.     Mouth/Throat:     Mouth: Mucous membranes are moist.     Pharynx: Oropharynx is clear.  Eyes:     General: No scleral icterus.       Right eye: No discharge.        Left eye: No discharge.     Extraocular Movements: Extraocular movements intact.     Conjunctiva/sclera: Conjunctivae normal.     Pupils: Pupils are equal, round, and reactive to light.  Cardiovascular:     Rate and Rhythm: Normal rate and regular rhythm.     Pulses: Normal pulses.     Heart sounds: Normal heart sounds. No murmur heard.    No friction rub.  No gallop.  Pulmonary:     Effort: Pulmonary effort is normal. No respiratory distress.     Breath sounds: Normal breath sounds. No stridor. No wheezing, rhonchi or rales.  Chest:     Chest wall: No tenderness.  Musculoskeletal:        General: Normal range of motion.     Cervical back: Normal range of motion and neck supple.  Skin:    General: Skin is warm and dry.     Capillary Refill: Capillary refill takes less than 2 seconds.     Coloration: Skin is not jaundiced or pale.     Findings: No bruising, erythema, lesion or rash.  Neurological:     General: No focal deficit present.     Mental Status: He is alert and oriented to person, place, and time. Mental  status is at baseline.  Psychiatric:        Mood and Affect: Mood normal.        Behavior: Behavior normal.        Thought Content: Thought content normal.        Judgment: Judgment normal.     Results for orders placed or performed in visit on 08/13/24  Microalbumin, Urine Waived   Collection Time: 08/13/24  9:48 AM  Result Value Ref Range   Microalb, Ur Waived 30 (H) 0 - 19 mg/L   Creatinine, Urine Waived 100 10 - 300 mg/dL   Microalb/Creat Ratio <30 <30 mg/g  Bayer DCA Hb A1c Waived   Collection Time: 08/13/24  9:48 AM  Result Value Ref Range   HB A1C (BAYER DCA - WAIVED) 6.1 (H) 4.8 - 5.6 %  Comprehensive metabolic panel with GFR   Collection Time: 08/13/24  9:49 AM  Result Value Ref Range   Glucose 103 (H) 70 - 99 mg/dL   BUN 23 6 - 24 mg/dL   Creatinine, Ser 8.89 0.76 - 1.27 mg/dL   eGFR 80 >40 fO/fpw/8.26   BUN/Creatinine Ratio 21 (H) 9 - 20   Sodium 137 134 - 144 mmol/L   Potassium 5.0 3.5 - 5.2 mmol/L   Chloride 102 96 - 106 mmol/L   CO2 20 20 - 29 mmol/L   Calcium  9.4 8.7 - 10.2 mg/dL   Total Protein 6.6 6.0 - 8.5 g/dL   Albumin 4.2 3.8 - 4.9 g/dL   Globulin, Total 2.4 1.5 - 4.5 g/dL   Bilirubin Total 0.4 0.0 - 1.2 mg/dL   Alkaline Phosphatase 84 44 - 121 IU/L   AST 18 0 - 40 IU/L   ALT 18 0 - 44 IU/L  CBC with Differential/Platelet   Collection Time: 08/13/24  9:49 AM  Result Value Ref Range   WBC 8.8 3.4 - 10.8 x10E3/uL   RBC 4.64 4.14 - 5.80 x10E6/uL   Hemoglobin 14.0 13.0 - 17.7 g/dL   Hematocrit 57.0 62.4 - 51.0 %   MCV 93 79 - 97 fL   MCH 30.2 26.6 - 33.0 pg   MCHC 32.6 31.5 - 35.7 g/dL   RDW 86.2 88.3 - 84.5 %   Platelets 239 150 - 450 x10E3/uL   Neutrophils 69 Not Estab. %   Lymphs 21 Not Estab. %   Monocytes 7 Not Estab. %   Eos 1 Not Estab. %   Basos 1 Not Estab. %   Neutrophils Absolute 6.2 1.4 - 7.0 x10E3/uL   Lymphocytes Absolute 1.9 0.7 - 3.1 x10E3/uL   Monocytes Absolute 0.6 0.1 - 0.9 x10E3/uL   EOS (  ABSOLUTE) 0.1 0.0 - 0.4 x10E3/uL    Basophils Absolute 0.0 0.0 - 0.2 x10E3/uL   Immature Granulocytes 0 Not Estab. %   Immature Grans (Abs) 0.0 0.0 - 0.1 x10E3/uL  Lipid Panel w/o Chol/HDL Ratio   Collection Time: 08/13/24  9:49 AM  Result Value Ref Range   Cholesterol, Total 155 100 - 199 mg/dL   Triglycerides 809 (H) 0 - 149 mg/dL   HDL 42 >60 mg/dL   VLDL Cholesterol Cal 32 5 - 40 mg/dL   LDL Chol Calc (NIH) 81 0 - 99 mg/dL  PSA   Collection Time: 08/13/24  9:49 AM  Result Value Ref Range   Prostate Specific Ag, Serum 0.5 0.0 - 4.0 ng/mL  TSH   Collection Time: 08/13/24  9:49 AM  Result Value Ref Range   TSH 2.650 0.450 - 4.500 uIU/mL  Uric acid   Collection Time: 08/13/24  9:49 AM  Result Value Ref Range   Uric Acid 4.2 3.8 - 8.4 mg/dL  Hepatitis B surface antibody,quantitative   Collection Time: 08/13/24  9:49 AM  Result Value Ref Range   Hepatitis B Surf Ab Quant <3.5 (L) Immunity>10 mIU/mL   *Note: Due to a large number of results and/or encounters for the requested time period, some results have not been displayed. A complete set of results can be found in Results Review.      Assessment & Plan:   Problem List Items Addressed This Visit       Cardiovascular and Mediastinum   Primary hypertension   BP still high- currently on prednisone  for URI. Will recheck in 2 weeks. Call with any concerns.         Digestive   GERD (gastroesophageal reflux disease)   Significantly better on 40mg . Call with any concerns. Continue to monitor.       Other Visit Diagnoses       Subacute cough    -  Primary   Finish antibiotics and prednisone . Discussed that cough can last for a few weeks after inital illness. Lungs clear. Call with any concerns. Recheck 2 weeks.        Follow up plan: Return in about 2 weeks (around 01/15/2025) for OK to double book.      "

## 2025-01-01 NOTE — Assessment & Plan Note (Signed)
 BP still high- currently on prednisone  for URI. Will recheck in 2 weeks. Call with any concerns.

## 2025-01-01 NOTE — Assessment & Plan Note (Signed)
 Significantly better on 40mg . Call with any concerns. Continue to monitor.

## 2025-01-07 ENCOUNTER — Encounter: Payer: Self-pay | Admitting: Family Medicine

## 2025-01-11 ENCOUNTER — Encounter: Payer: Self-pay | Admitting: Family Medicine

## 2025-01-11 ENCOUNTER — Other Ambulatory Visit: Payer: Self-pay | Admitting: Family Medicine

## 2025-01-13 ENCOUNTER — Encounter: Payer: Self-pay | Admitting: Family Medicine

## 2025-01-15 ENCOUNTER — Encounter: Payer: Self-pay | Admitting: Family Medicine

## 2025-01-15 ENCOUNTER — Ambulatory Visit (INDEPENDENT_AMBULATORY_CARE_PROVIDER_SITE_OTHER): Payer: MEDICAID | Admitting: Family Medicine

## 2025-01-15 VITALS — BP 128/67 | HR 102 | Temp 97.4°F | Wt 194.4 lb

## 2025-01-15 DIAGNOSIS — I10 Essential (primary) hypertension: Secondary | ICD-10-CM

## 2025-01-15 DIAGNOSIS — R052 Subacute cough: Secondary | ICD-10-CM

## 2025-01-15 NOTE — Progress Notes (Signed)
 "  BP 128/67   Pulse (!) 102   Temp (!) 97.4 F (36.3 C) (Oral)   Wt 194 lb 6.4 oz (88.2 kg)   SpO2 98%   BMI 33.37 kg/m    Subjective:    Patient ID: Jesus Guerrero, male    DOB: 1971/06/17, 54 y.o.   MRN: [id number]  HPI: Jesus Guerrero is a 54 y.o. male  Chief Complaint  Patient presents with   Hypertension   Cough   HYPERTENSION  Hypertension status: controlled  Satisfied with current treatment? yes Duration of hypertension: chronic BP monitoring frequency:  not checking BP medication side effects:  no Medication compliance: excellent compliance Previous BP meds: lisinopril  Aspirin: no Recurrent headaches: no Visual changes: no Palpitations: no Dyspnea: no Chest pain: no Lower extremity edema: no Dizzy/lightheaded: no  Cough is resolved. Feeling better. No issues. Has been having a headache- taking tylenol  with some benefit. Has an eye doctor appointment pending.   Relevant past medical, surgical, family and social history reviewed and updated as indicated. Interim medical history since our last visit reviewed. Allergies and medications reviewed and updated.  Review of Systems  Constitutional: Negative.   Respiratory: Negative.    Cardiovascular: Negative.   Musculoskeletal: Negative.   Neurological:  Positive for headaches. Negative for dizziness, tremors, seizures, syncope, facial asymmetry, speech difficulty, weakness, light-headedness and numbness.  Psychiatric/Behavioral: Negative.      Per HPI unless specifically indicated above     Objective:    BP 128/67   Pulse (!) 102   Temp (!) 97.4 F (36.3 C) (Oral)   Wt 194 lb 6.4 oz (88.2 kg)   SpO2 98%   BMI 33.37 kg/m   Wt Readings from Last 3 Encounters:  01/15/25 194 lb 6.4 oz (88.2 kg)  01/01/25 193 lb 3.2 oz (87.6 kg)  11/16/24 205 lb (93 kg)    Physical Exam Vitals and nursing note reviewed.  Constitutional:      General: He is not in acute distress.    Appearance: Normal  appearance. He is not ill-appearing, toxic-appearing or diaphoretic.  HENT:     Head: Normocephalic and atraumatic.     Right Ear: External ear normal.     Left Ear: External ear normal.     Nose: Nose normal.     Mouth/Throat:     Mouth: Mucous membranes are moist.     Pharynx: Oropharynx is clear.  Eyes:     General: No scleral icterus.       Right eye: No discharge.        Left eye: No discharge.     Extraocular Movements: Extraocular movements intact.     Conjunctiva/sclera: Conjunctivae normal.     Pupils: Pupils are equal, round, and reactive to light.  Cardiovascular:     Rate and Rhythm: Normal rate and regular rhythm.     Pulses: Normal pulses.     Heart sounds: Normal heart sounds. No murmur heard.    No friction rub. No gallop.  Pulmonary:     Effort: Pulmonary effort is normal. No respiratory distress.     Breath sounds: Normal breath sounds. No stridor. No wheezing, rhonchi or rales.  Chest:     Chest wall: No tenderness.  Musculoskeletal:        General: Normal range of motion.     Cervical back: Normal range of motion and neck supple.  Skin:    General: Skin is warm and dry.     Capillary  Refill: Capillary refill takes less than 2 seconds.     Coloration: Skin is not jaundiced or pale.     Findings: No bruising, erythema, lesion or rash.  Neurological:     General: No focal deficit present.     Mental Status: He is alert and oriented to person, place, and time. Mental status is at baseline.  Psychiatric:        Mood and Affect: Mood normal.        Behavior: Behavior normal.        Thought Content: Thought content normal.        Judgment: Judgment normal.     Results for orders placed or performed in visit on 08/13/24  Microalbumin, Urine Waived   Collection Time: 08/13/24  9:48 AM  Result Value Ref Range   Microalb, Ur Waived 30 (H) 0 - 19 mg/L   Creatinine, Urine Waived 100 10 - 300 mg/dL   Microalb/Creat Ratio <30 <30 mg/g  Bayer DCA Hb A1c Waived    Collection Time: 08/13/24  9:48 AM  Result Value Ref Range   HB A1C (BAYER DCA - WAIVED) 6.1 (H) 4.8 - 5.6 %  Comprehensive metabolic panel with GFR   Collection Time: 08/13/24  9:49 AM  Result Value Ref Range   Glucose 103 (H) 70 - 99 mg/dL   BUN 23 6 - 24 mg/dL   Creatinine, Ser 8.89 0.76 - 1.27 mg/dL   eGFR 80 >40 fO/fpw/8.26   BUN/Creatinine Ratio 21 (H) 9 - 20   Sodium 137 134 - 144 mmol/L   Potassium 5.0 3.5 - 5.2 mmol/L   Chloride 102 96 - 106 mmol/L   CO2 20 20 - 29 mmol/L   Calcium  9.4 8.7 - 10.2 mg/dL   Total Protein 6.6 6.0 - 8.5 g/dL   Albumin 4.2 3.8 - 4.9 g/dL   Globulin, Total 2.4 1.5 - 4.5 g/dL   Bilirubin Total 0.4 0.0 - 1.2 mg/dL   Alkaline Phosphatase 84 44 - 121 IU/L   AST 18 0 - 40 IU/L   ALT 18 0 - 44 IU/L  CBC with Differential/Platelet   Collection Time: 08/13/24  9:49 AM  Result Value Ref Range   WBC 8.8 3.4 - 10.8 x10E3/uL   RBC 4.64 4.14 - 5.80 x10E6/uL   Hemoglobin 14.0 13.0 - 17.7 g/dL   Hematocrit 57.0 62.4 - 51.0 %   MCV 93 79 - 97 fL   MCH 30.2 26.6 - 33.0 pg   MCHC 32.6 31.5 - 35.7 g/dL   RDW 86.2 88.3 - 84.5 %   Platelets 239 150 - 450 x10E3/uL   Neutrophils 69 Not Estab. %   Lymphs 21 Not Estab. %   Monocytes 7 Not Estab. %   Eos 1 Not Estab. %   Basos 1 Not Estab. %   Neutrophils Absolute 6.2 1.4 - 7.0 x10E3/uL   Lymphocytes Absolute 1.9 0.7 - 3.1 x10E3/uL   Monocytes Absolute 0.6 0.1 - 0.9 x10E3/uL   EOS (ABSOLUTE) 0.1 0.0 - 0.4 x10E3/uL   Basophils Absolute 0.0 0.0 - 0.2 x10E3/uL   Immature Granulocytes 0 Not Estab. %   Immature Grans (Abs) 0.0 0.0 - 0.1 x10E3/uL  Lipid Panel w/o Chol/HDL Ratio   Collection Time: 08/13/24  9:49 AM  Result Value Ref Range   Cholesterol, Total 155 100 - 199 mg/dL   Triglycerides 809 (H) 0 - 149 mg/dL   HDL 42 >60 mg/dL   VLDL Cholesterol Cal 32 5 - 40  mg/dL   LDL Chol Calc (NIH) 81 0 - 99 mg/dL  PSA   Collection Time: 08/13/24  9:49 AM  Result Value Ref Range   Prostate Specific Ag,  Serum 0.5 0.0 - 4.0 ng/mL  TSH   Collection Time: 08/13/24  9:49 AM  Result Value Ref Range   TSH 2.650 0.450 - 4.500 uIU/mL  Uric acid   Collection Time: 08/13/24  9:49 AM  Result Value Ref Range   Uric Acid 4.2 3.8 - 8.4 mg/dL  Hepatitis B surface antibody,quantitative   Collection Time: 08/13/24  9:49 AM  Result Value Ref Range   Hepatitis B Surf Ab Quant <3.5 (L) Immunity>10 mIU/mL   *Note: Due to a large number of results and/or encounters for the requested time period, some results have not been displayed. A complete set of results can be found in Results Review.      Assessment & Plan:   Problem List Items Addressed This Visit       Cardiovascular and Mediastinum   Primary hypertension - Primary   Better off prednisone . Will not change medication. Continue to monitor. Call with any concerns.       Other Visit Diagnoses       Subacute cough       Resolved.        Follow up plan: Return in about 4 weeks (around 02/12/2025).      "

## 2025-01-15 NOTE — Assessment & Plan Note (Signed)
 Better off prednisone . Will not change medication. Continue to monitor. Call with any concerns.

## 2025-01-18 ENCOUNTER — Encounter: Payer: Self-pay | Admitting: Dermatology

## 2025-01-19 ENCOUNTER — Ambulatory Visit: Payer: MEDICAID

## 2025-01-19 DIAGNOSIS — L209 Atopic dermatitis, unspecified: Secondary | ICD-10-CM | POA: Diagnosis not present

## 2025-01-19 MED ORDER — NEMOLIZUMAB-ILTO 30 MG ~~LOC~~ AUIJ
30.0000 mg | AUTO-INJECTOR | Freq: Once | SUBCUTANEOUS | Status: AC
  Administered 2025-01-19: 30 mg via SUBCUTANEOUS

## 2025-01-19 NOTE — Progress Notes (Addendum)
 Patient here today for Nemluvio  injection for atopic dermatitis.    Nemluvio  30mg /mL injected SQ into the left  upper arm. Patient tolerated injection well.    NDC: 9700-3779-84 Lot: [id number] Exp: 01/31/2027  Photo taken of place on chest. Advised patient can use mupirocin  BID.    Alan Pizza RMA

## 2025-01-25 ENCOUNTER — Encounter: Payer: Self-pay | Admitting: Family Medicine

## 2025-01-28 ENCOUNTER — Encounter: Payer: Self-pay | Admitting: Family Medicine

## 2025-02-01 ENCOUNTER — Encounter: Payer: Self-pay | Admitting: Dermatology

## 2025-02-01 ENCOUNTER — Encounter: Payer: Self-pay | Admitting: Family Medicine

## 2025-02-02 ENCOUNTER — Other Ambulatory Visit: Payer: Self-pay | Admitting: Family Medicine

## 2025-02-10 ENCOUNTER — Ambulatory Visit: Payer: MEDICAID | Admitting: Family Medicine

## 2025-02-16 ENCOUNTER — Ambulatory Visit: Payer: MEDICAID

## 2025-05-12 ENCOUNTER — Ambulatory Visit: Payer: MEDICAID | Admitting: Dermatology
